# Patient Record
Sex: Male | Born: 1935 | Race: White | Hispanic: No | Marital: Married | Smoking: Former smoker
Health system: Southern US, Community
[De-identification: ages and names within clinical notes are randomized; demographics above are authoritative.]

## PROBLEM LIST (undated history)

## (undated) DIAGNOSIS — F419 Anxiety disorder, unspecified: Secondary | ICD-10-CM

## (undated) DIAGNOSIS — I209 Angina pectoris, unspecified: Secondary | ICD-10-CM

## (undated) DIAGNOSIS — L111 Transient acantholytic dermatosis [Grover]: Secondary | ICD-10-CM

## (undated) DIAGNOSIS — R319 Hematuria, unspecified: Secondary | ICD-10-CM

## (undated) DIAGNOSIS — R42 Dizziness and giddiness: Secondary | ICD-10-CM

## (undated) DIAGNOSIS — H9313 Tinnitus, bilateral: Secondary | ICD-10-CM

## (undated) DIAGNOSIS — R51 Headache: Secondary | ICD-10-CM

## (undated) DIAGNOSIS — D61818 Other pancytopenia: Secondary | ICD-10-CM

## (undated) DIAGNOSIS — M1990 Unspecified osteoarthritis, unspecified site: Secondary | ICD-10-CM

## (undated) DIAGNOSIS — C801 Malignant (primary) neoplasm, unspecified: Secondary | ICD-10-CM

## (undated) DIAGNOSIS — Z789 Other specified health status: Secondary | ICD-10-CM

## (undated) HISTORY — PX: CARDIAC CATHETERIZATION: SHX172

## (undated) HISTORY — DX: Hematuria, unspecified: R31.9

## (undated) HISTORY — DX: Tinnitus, bilateral: H93.13

## (undated) HISTORY — DX: Other specified health status: Z78.9

## (undated) HISTORY — PX: NOSE SURGERY: SHX723

## (undated) HISTORY — DX: Other pancytopenia: D61.818

---

## 1968-10-26 HISTORY — PX: VASECTOMY: SHX75

## 1972-10-26 HISTORY — PX: THYROIDECTOMY, PARTIAL: SHX18

## 1988-10-26 HISTORY — PX: KNEE SURGERY: SHX244

## 1998-08-06 ENCOUNTER — Ambulatory Visit (HOSPITAL_COMMUNITY): Admission: RE | Admit: 1998-08-06 | Discharge: 1998-08-06 | Payer: Self-pay | Admitting: Gastroenterology

## 1999-01-21 ENCOUNTER — Ambulatory Visit (HOSPITAL_BASED_OUTPATIENT_CLINIC_OR_DEPARTMENT_OTHER): Admission: RE | Admit: 1999-01-21 | Discharge: 1999-01-21 | Payer: Self-pay | Admitting: Otolaryngology

## 2000-10-26 HISTORY — PX: RADIOACTIVE SEED IMPLANT: SHX5150

## 2001-07-18 ENCOUNTER — Ambulatory Visit (HOSPITAL_COMMUNITY): Admission: RE | Admit: 2001-07-18 | Discharge: 2001-07-18 | Payer: Self-pay | Admitting: Internal Medicine

## 2001-08-16 ENCOUNTER — Other Ambulatory Visit: Admission: RE | Admit: 2001-08-16 | Discharge: 2001-08-16 | Payer: Self-pay | Admitting: Urology

## 2001-08-23 ENCOUNTER — Ambulatory Visit: Admission: RE | Admit: 2001-08-23 | Discharge: 2001-11-21 | Payer: Self-pay | Admitting: Radiation Oncology

## 2001-09-15 ENCOUNTER — Ambulatory Visit (HOSPITAL_COMMUNITY): Admission: RE | Admit: 2001-09-15 | Discharge: 2001-09-15 | Payer: Self-pay | Admitting: Gastroenterology

## 2001-10-07 ENCOUNTER — Encounter: Admission: RE | Admit: 2001-10-07 | Discharge: 2001-10-07 | Payer: Self-pay | Admitting: Radiation Oncology

## 2001-11-22 ENCOUNTER — Emergency Department (HOSPITAL_COMMUNITY): Admission: EM | Admit: 2001-11-22 | Discharge: 2001-11-22 | Payer: Self-pay | Admitting: Emergency Medicine

## 2001-11-22 ENCOUNTER — Ambulatory Visit (HOSPITAL_BASED_OUTPATIENT_CLINIC_OR_DEPARTMENT_OTHER): Admission: RE | Admit: 2001-11-22 | Discharge: 2001-11-22 | Payer: Self-pay | Admitting: Urology

## 2001-12-15 ENCOUNTER — Ambulatory Visit: Admission: RE | Admit: 2001-12-15 | Discharge: 2002-03-15 | Payer: Self-pay | Admitting: Radiation Oncology

## 2002-08-22 ENCOUNTER — Encounter (HOSPITAL_BASED_OUTPATIENT_CLINIC_OR_DEPARTMENT_OTHER): Payer: Self-pay | Admitting: Internal Medicine

## 2002-08-22 ENCOUNTER — Ambulatory Visit (HOSPITAL_COMMUNITY): Admission: RE | Admit: 2002-08-22 | Discharge: 2002-08-22 | Payer: Self-pay | Admitting: Internal Medicine

## 2003-12-03 ENCOUNTER — Ambulatory Visit: Admission: RE | Admit: 2003-12-03 | Discharge: 2003-12-03 | Payer: Self-pay | Admitting: Radiation Oncology

## 2006-02-26 ENCOUNTER — Inpatient Hospital Stay (HOSPITAL_BASED_OUTPATIENT_CLINIC_OR_DEPARTMENT_OTHER): Admission: RE | Admit: 2006-02-26 | Discharge: 2006-02-26 | Payer: Self-pay | Admitting: Cardiology

## 2006-10-26 HISTORY — PX: CARPAL TUNNEL RELEASE: SHX101

## 2007-02-21 ENCOUNTER — Encounter: Admission: RE | Admit: 2007-02-21 | Discharge: 2007-02-21 | Payer: Self-pay | Admitting: General Surgery

## 2007-02-23 ENCOUNTER — Ambulatory Visit (HOSPITAL_BASED_OUTPATIENT_CLINIC_OR_DEPARTMENT_OTHER): Admission: RE | Admit: 2007-02-23 | Discharge: 2007-02-23 | Payer: Self-pay | Admitting: General Surgery

## 2007-04-25 ENCOUNTER — Emergency Department (HOSPITAL_COMMUNITY): Admission: EM | Admit: 2007-04-25 | Discharge: 2007-04-25 | Payer: Self-pay | Admitting: Emergency Medicine

## 2007-07-10 ENCOUNTER — Emergency Department (HOSPITAL_COMMUNITY): Admission: EM | Admit: 2007-07-10 | Discharge: 2007-07-10 | Payer: Self-pay | Admitting: Emergency Medicine

## 2007-08-12 ENCOUNTER — Encounter: Admission: RE | Admit: 2007-08-12 | Discharge: 2007-08-12 | Payer: Self-pay | Admitting: Internal Medicine

## 2007-08-30 ENCOUNTER — Encounter: Admission: RE | Admit: 2007-08-30 | Discharge: 2007-08-30 | Payer: Self-pay | Admitting: Internal Medicine

## 2007-09-29 ENCOUNTER — Ambulatory Visit (HOSPITAL_BASED_OUTPATIENT_CLINIC_OR_DEPARTMENT_OTHER): Admission: RE | Admit: 2007-09-29 | Discharge: 2007-09-29 | Payer: Self-pay | Admitting: Orthopedic Surgery

## 2010-02-25 ENCOUNTER — Ambulatory Visit (HOSPITAL_BASED_OUTPATIENT_CLINIC_OR_DEPARTMENT_OTHER): Admission: RE | Admit: 2010-02-25 | Discharge: 2010-02-25 | Payer: Self-pay | Admitting: Urology

## 2010-10-26 HISTORY — PX: SHOULDER SURGERY: SHX246

## 2011-03-10 NOTE — Op Note (Signed)
[name], Jordan Terry                ACCOUNT NO.:  [id number]   MEDICAL RECORD NO.:  [id number]          PATIENT TYPE:  AMB   LOCATION:  DSC                          FACILITY:  [facility]   PHYSICIAN:  Cindee Salt, M.D.       DATE OF BIRTH:  11/15/1931   DATE OF PROCEDURE:  09/29/2007  DATE OF DISCHARGE:                               OPERATIVE REPORT   PREOPERATIVE DIAGNOSIS:  Carpal tunnel syndrome, left hand.   POSTOPERATIVE DIAGNOSIS:  Carpal tunnel syndrome, left hand.   OPERATION:  Decompression left median nerve.   SURGEON:  Cindee Salt, M.D.   ASSISTANT:  None.   ANESTHESIA:  Forearm based IV regional.   DATE OF OPERATION:  September 29, 2007.   HISTORY:  The patient is a 77 year old male with a history of carpal  tunnel syndrome, EMG nerve conductions positive.  This has not responded  to conservative treatment.  He has elected to undergo surgical  decompression.  Pre and postoperative course have been discussed along  with the risks and complications.  He is aware there is no guarantee  with surgery, possibility of infection, recurrence, injury to arteries,  nerves, tendons, incomplete relief of symptoms, dystrophy.  In the  preoperative area the patient is seen, questions encouraged and answered  again, the extremity marked by both the patient and surgeon.  Antibiotic  given.   PROCEDURE:  The patient is brought to the operating room where a forearm  based IV regional anesthetic was carried out without difficulty.  He was  prepped using DuraPrep, supine position, left arm free.  A longitudinal  incision was made in the palm, carried down through subcutaneous tissue.  Bleeders were electrocauterized.  Palmar fascia split, superficial  palmar arch identified, flexor tendon of the ring and little finger  identified to the ulnar side of median nerve and carpal retinaculum was  incised with sharp dissection.  A right-angle and Sewall retractor were  placed between skin and  forearm fascia.  The fascia was released for  approximately 1.5 cm proximal to the wrist crease under direct vision.  Canal was explored.  No further lesions were identified.  The wound was  irrigated.  Skin  was closed with interrupted 5-0 Vicryl Rapide sutures.  A sterile  compressive dressing and splint to the wrist applied.  The patient  tolerated the procedure well and was taken to the recovery room for  observation in satisfactory condition.  He will be discharged home to  return to the [facility] [place] in 1 week on [place].           ______________________________  Cindee Salt, M.D.     GK/MEDQ  D:  09/29/2007  T:  09/29/2007  Job:  [id number]   cc:   Cindee Salt, M.D.  Barry Dienes Eloise Harman, M.D.

## 2011-03-13 NOTE — Op Note (Signed)
Jordan Terry, Jordan Terry                ACCOUNT NO.:  [id number]   MEDICAL RECORD NO.:  [id number]          PATIENT TYPE:  AMB   LOCATION:  NESC                         FACILITY:  [facility]   PHYSICIAN:  Adolph Pollack, M.D.DATE OF BIRTH:  05-Aug-1932   DATE OF PROCEDURE:  DATE OF DISCHARGE:                               OPERATIVE REPORT   PREOPERATIVE DIAGNOSIS:  Right inguinal hernia.   POSTOPERATIVE DIAGNOSIS:  Indirect right inguinal hernia.   PROCEDURE:  Right inguinal hernia repair with mesh.   SURGEON:  Adolph Pollack, M.D.   ANESTHESIA:  General by way of LMA plus local (0.5% Marcaine).   INDICATIONS:  Jordan Terry is a 77 year old male, quite active, who noted a  bulge with discomfort in the right groin.  He has a right inguinal  hernia and now presents for repair.  We have discussed procedure risks  and aftercare preoperatively.   TECHNIQUE:  He is seen in the holding area and the right groin marked  with my initials.  He was then brought to the operating and placed  supine on the operating table and given a general anesthetic by way of  LMA.  The hair on the right groin was clipped and the area was sterilely  prepped and draped.  Marcaine solution was infiltrated superficially and  deep in the right groin.  A right groin incision was made through the  skin and subcutaneous tissue and Scarpa's fascia, exposing the external  oblique aponeurosis.  Local anesthetic was infiltrated deep to the  external oblique aponeurosis.  An incision was made in the external  oblique aponeurosis through the external ring medially and up toward the  anterior superior iliac spine laterally.  Using blunt dissection, I  identified the internal oblique muscle and aponeurosis superiorly and  the shelving edge of the inguinal ligament inferiorly.  Ilioinguinal  nerve was identified and retracted superiorly.   I then isolated the spermatic cord and bluntly created a window around  it.  An  indirect sac was noted and dissected free from the spermatic  cord and then reduced back through a patulous internal ring.  Following  this, a piece of 3 x 6 inch polypropylene mesh was brought into the  field.  It was anchored 1 cm medial to the pubic tubercle with a 2-0  Prolene suture.  The inferior aspect of the mesh was then anchored to  the shelving edge of the inguinal ligament with a running 2-0 Prolene  suture up to the level of 1-2 cm lateral to the internal ring.  A slit  was cut in the mesh and two tails wrapped around the cord.  The superior  aspect of the mesh was anchored to the internal oblique aponeurosis with  interrupted 2-0 Vicryl sutures.  Two tails of the mesh were crossed  creating a new internal ring and these were anchored to the shelving  edge of the inguinal ligament with a 2-0 Prolene suture.  The tip of the  hemostat was able to be placed in the new aperture.   Following this, I inspected the area  and hemostasis was adequate.  Local  anesthetic was infiltrated in around the inner ilioinguinal nerve.  The  external oblique aponeurosis was then closed over the mesh and cord with  a running 3-0 Vicryl suture.  Scarpa's fascia was reapproximated with a  running 2-0 Vicryl suture.  The skin was closed with a 4-0 Monocryl  subcuticular stitch followed by Steri-Strips and sterile dressing.   He tolerated the procedure without any apparent complications and he was  taken to the PACU in satisfactory condition.      Adolph Pollack, M.D.  Electronically Signed     TJR/MEDQ  D:  02/23/2007  T:  02/23/2007  Job:  [id number]

## 2011-03-13 NOTE — H&P (Signed)
Jordan Terry, Jordan Terry                ACCOUNT NO.:  [id number]   MEDICAL RECORD NO.:  [id number]            PATIENT TYPE:   LOCATION:                                 FACILITY:   PHYSICIAN:  Colleen Can. Deborah Chalk, M.D.DATE OF BIRTH:  08-20-1932   DATE OF ADMISSION:  02/26/2006  DATE OF DISCHARGE:                                HISTORY & PHYSICAL   CHIEF COMPLAINT:  Chest pain.   HISTORY OF PRESENT ILLNESS:  The patient is a 77 year old male who is  referred for diagnostic catheterization.  He has had chest pain with  exertion that has been worsening.  He maintains a regular exercise program.  After walking 2 to 3 miles, he will begin to have chest tightness.  He has  also had substernal chest pain with yard work.  He was seen by his primary  care Dishon Kehoe last week and reported these symptoms.  He does obtain relief  with Lorcet.  He is now referred for elective cardiac catheterization.   PAST MEDICAL HISTORY:  1.  Chronic mild low back pain.  2.  History of sleep disorder.  3.  Significant anxiety.  4.  Gastroesophageal reflux disease.  5.  Osteoarthritis.  6.  Partial thyroidectomy.  7.  History of rectal bleeding.  8.  Previous knee surgeries.  9.  Remote history of migraine.   ALLERGIES:  None.   CURRENT MEDICATIONS:  1.  Ativan 1 mg a day.  2.  Lorcet p.r.n.  3.  Neurontin 300 mg a day.  4.  Ibuprofen p.r.n.  5.  Restoril p.r.n.   FAMILY HISTORY:  Both of his parents are deceased.  Father died at 61 of  lung and colon cancer.  Mother died in 99s of natural causes.   SOCIAL HISTORY:  He is married.  He has no current alcohol or tobacco.   REVIEW OF SYSTEMS:  Basically as noted above.  He does have significant  anxiety and stressors in his life which have been chronic in nature.  He  does have difficulty sleeping which has been chronic as well.  He has had  problems with gastroesophageal reflux disease.  He has had no chest pain at  rest.   PHYSICAL EXAMINATION:   GENERAL:  He is an anxious white male in no acute  distress.  VITAL SIGNS: Weight 166 pounds, blood pressure 130/80 sitting, 122/80  standing, heart rate 67, respirations 18.  He is afebrile.  SKIN:  Warm and dry.  Color is unremarkable.  LUNGS: Clear.  HEART: Regular rhythm.  ABDOMEN: Soft, positive bowel sounds, nontender.  EXTREMITIES:  Without edema.  NEUROLOGIC: Intact.  There are no gross focal deficits.   LABORATORY DATA:  Pertinent labs are pending.   EKG shows sinus rhythm with no acute changes.   OVERALL IMPRESSION:  1.  Exertional chest pain.  2.  Significant anxiety and depression.   PLAN:  We will proceed on with diagnostic catheterization.  The procedure  has been reviewed in full detail, and he is willing to proceed on Friday,  Feb 26, 2006.  Sharlee Blew, N.P.      Colleen Can. Deborah Chalk, M.D.  Electronically Signed    LC/MEDQ  D:  02/15/2006  T:  02/15/2006  Job:  [id number]   cc:   Barry Dienes. Eloise Harman, M.D.  Fax: [phone]

## 2011-03-13 NOTE — Op Note (Signed)
NAMETHEORDORE, CISNERO                ACCOUNT NO.:  [id number]   MEDICAL RECORD NO.:  [id number]          PATIENT TYPE:  AMB   LOCATION:  NESC                         [facility]ACILITY:  [facility]   PHYSICIAN:  Adolph Pollack, M.D.DATE OF BIRTH:  1932-03-25   DATE OF PROCEDURE:  DATE OF DISCHARGE:                               OPERATIVE REPORT   Audio too short to transcribe (less than 5 seconds)      Adolph Pollack, M.D.     Kari Baars  D:  02/23/2007  T:  02/23/2007  Job:  [id number]

## 2011-03-13 NOTE — Cardiovascular Report (Signed)
Jordan Terry, Jordan Terry                ACCOUNT NO.:  [id number]   MEDICAL RECORD NO.:  [id number]          PATIENT TYPE:  OIB   LOCATION:  1961                         FACILITY:  [facility]   PHYSICIAN:  Colleen Can. Deborah Chalk, M.D.DATE OF BIRTH:  [birth year]   DATE OF PROCEDURE:  02/26/2006  DATE OF DISCHARGE:                              CARDIAC CATHETERIZATION   HISTORY:  Mr. Aldava has had there is persistent and recurrent substernal  chest tightness.  He has had longstanding chest discomfort that has been  equivocal as for exercise versus stress.  He is now referred for  catheterization for definition of the etiology of this chest pain syndrome.   PROCEDURE:  Left heart catheterization with selective coronary angiography  and left ventricular angiography.   TYPE AND SITE OF ENTRY:  Percutaneous right femoral artery.   CATHETERS:  4-French 4 curved Judkins right and left coronary catheters, 4-  French pigtail ventriculographic catheter.   CONTRAST MATERIAL:  Omnipaque.   MEDICATIONS GIVEN PRIOR TO PROCEDURE:  Valium 10 mg.   MEDICATIONS GIVEN DURING PROCEDURE:  Versed 3 mg IV.   COMMENT:  The patient tolerated the procedure well.   HEMODYNAMIC DATA:  The aortic pressure was 99/64, LV is 105/1-2.  There was  no aortic valve gradient on pullback.   ANGIOGRAPHIC DATA.:  1.  Left main coronary artery is normal.  2.  Left circumflex:  The left circumflex bifurcates high and __________.      It has a large obtuse marginal and continuation branch.  It is      essentially normal with only minor irregularities.  3.  Left anterior descending:  Left anterior descending is a long vessel      that wraps around the apex.  There are minor irregularities in the left      anterior descending, but there is some calcification.  There are two      high diagonal vessels, one being larger than the other.  There is 30%      ostial narrowing in these vessels but it is not felt to be critical.      These  vessels are moderate in size.  4.  Right coronary artery:  The right coronary artery is a large, dominant      vessel.  There is mild aneurysmal dilatation in the proximal part and      mild less than 30% irregularities in narrowing in the proximal portions      of the vessel.  The distal right coronary artery is essentially normal.      There does not appear to be any focal obstructive disease.   Left ventricular angiogram was performed in the RAO position.  The overall  cardiac size and silhouette are normal.  Global ejection fraction is 65%.  Regional wall motion is normal.   OVERALL IMPRESSION:  1.  Normal left ventricular function.  2.  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   DISCUSSION:  It is felt that Mr. Monje has mild coronary atherosclerosis but  is it would not be the clinically etiology of his chest pain syndrome.  We  will encourage stress management and overall emphasize what I would consider  to be a good prognosis.  He does need to have appropriate management of  cardiovascular risk as well.      Colleen Can. Deborah Chalk, M.D.  Electronically Signed     SNT/MEDQ  D:  02/26/2006  T:  02/26/2006  Job:  [id number]   cc:   Barry Dienes. Eloise Harman, M.D.  Fax: [phone]

## 2011-05-21 ENCOUNTER — Encounter (INDEPENDENT_AMBULATORY_CARE_PROVIDER_SITE_OTHER): Payer: MEDICARE

## 2011-05-21 DIAGNOSIS — M79609 Pain in unspecified limb: Secondary | ICD-10-CM

## 2011-05-27 ENCOUNTER — Other Ambulatory Visit: Payer: Self-pay | Admitting: Dermatology

## 2011-06-03 NOTE — Procedures (Unsigned)
DUPLEX DEEP VENOUS EXAM - LOWER EXTREMITY  INDICATION:  Pain, rule out DVT.  HISTORY:  Edema:  No. Trauma/Surgery:  No. Pain:  The patient states he had 2 ten minute episodes of left foot/ankle pain over the last 3 days, currently having left anterior knee pain. PE:  No. Previous DVT:  No. Anticoagulants: Other:  DUPLEX EXAM:               CFV   SFV   PopV  PTV    GSV               R  L  R  L  R  L  R   L  R  L Thrombosis    o  o     o     o      o     o Spontaneous   +  +     +     +      +     + Phasic        +  +     +     +      +     + Augmentation  +  +     +     +      +     + Compressible  +  +     +     +      +     + Competent  Legend:  + - yes  o - no  p - partial  D - decreased  IMPRESSION:  No evidence of deep or superficial vein thrombosis noted in the left lower extremity. Preliminary findings stating the patency and compressibility of the left lower extremity venous system were given verbally to Fannie Knee Drinkard on 05/21/2011 at 2:15 p.m.   _____________________________ V. Charlena Cross, MD  CH/MEDQ  D:  05/22/2011  T:  05/22/2011  Job:  [id number]

## 2011-06-09 ENCOUNTER — Emergency Department (HOSPITAL_COMMUNITY)
Admission: EM | Admit: 2011-06-09 | Discharge: 2011-06-09 | Disposition: A | Discharge status: Home / Self Care | Payer: MEDICARE | Attending: Emergency Medicine | Admitting: Emergency Medicine

## 2011-06-09 ENCOUNTER — Emergency Department (HOSPITAL_COMMUNITY): Payer: MEDICARE

## 2011-06-09 ENCOUNTER — Encounter (HOSPITAL_COMMUNITY): Payer: Self-pay

## 2011-06-09 DIAGNOSIS — R1031 Right lower quadrant pain: Secondary | ICD-10-CM | POA: Insufficient documentation

## 2011-06-09 DIAGNOSIS — I2510 Atherosclerotic heart disease of native coronary artery without angina pectoris: Secondary | ICD-10-CM | POA: Insufficient documentation

## 2011-06-09 LAB — URINALYSIS, ROUTINE W REFLEX MICROSCOPIC
Ketones, ur: NEGATIVE mg/dL
Leukocytes, UA: NEGATIVE
Nitrite: NEGATIVE
Protein, ur: NEGATIVE mg/dL
Urobilinogen, UA: 0.2 mg/dL (ref 0.0–1.0)
pH: 5 (ref 5.0–8.0)

## 2011-06-09 LAB — DIFFERENTIAL
Eosinophils Relative: 1 % (ref 0–5)
Lymphocytes Relative: 20 % (ref 12–46)
Lymphs Abs: 1.4 10*3/uL (ref 0.7–4.0)
Monocytes Absolute: 0.7 10*3/uL (ref 0.1–1.0)

## 2011-06-09 LAB — COMPREHENSIVE METABOLIC PANEL
Albumin: 3.4 g/dL — ABNORMAL LOW (ref 3.5–5.2)
BUN: 13 mg/dL (ref 6–23)
Chloride: 104 mEq/L (ref 96–112)
Creatinine, Ser: 0.75 mg/dL (ref 0.50–1.35)
GFR calc Af Amer: >60 mL/min (ref >60)
Glucose, Bld: 104 mg/dL — ABNORMAL HIGH (ref 70–99)
Total Bilirubin: 0.7 mg/dL (ref 0.3–1.2)

## 2011-06-09 LAB — CBC
HCT: 36.9 % — ABNORMAL LOW (ref 39.0–52.0)
MCHC: 32.5 g/dL (ref 30.0–36.0)
MCV: 78 fL (ref 78.0–100.0)
RDW: 19.1 % — ABNORMAL HIGH (ref 11.5–15.5)
WBC: 6.7 10*3/uL (ref 4.0–10.5)

## 2011-06-09 LAB — LIPASE, BLOOD: Lipase: 36 U/L (ref 11–59)

## 2011-06-09 MED ORDER — IOHEXOL 300 MG/ML  SOLN
100.0000 mL | Freq: Once | INTRAMUSCULAR | Status: AC | PRN
  Administered 2011-06-09: 100 mL via INTRAVENOUS

## 2011-06-10 LAB — URINE CULTURE: Culture  Setup Time: 201208140911

## 2011-06-11 ENCOUNTER — Other Ambulatory Visit: Payer: Self-pay | Admitting: Gastroenterology

## 2011-06-19 ENCOUNTER — Other Ambulatory Visit: Payer: Self-pay | Admitting: Gastroenterology

## 2011-07-05 ENCOUNTER — Emergency Department (HOSPITAL_COMMUNITY)
Admission: EM | Admit: 2011-07-05 | Discharge: 2011-07-05 | Disposition: A | Discharge status: Home / Self Care | Payer: MEDICARE | Attending: Emergency Medicine | Admitting: Emergency Medicine

## 2011-07-05 DIAGNOSIS — I2510 Atherosclerotic heart disease of native coronary artery without angina pectoris: Secondary | ICD-10-CM | POA: Insufficient documentation

## 2011-07-05 DIAGNOSIS — R259 Unspecified abnormal involuntary movements: Secondary | ICD-10-CM | POA: Insufficient documentation

## 2011-07-05 DIAGNOSIS — I440 Atrioventricular block, first degree: Secondary | ICD-10-CM | POA: Insufficient documentation

## 2011-07-05 DIAGNOSIS — R109 Unspecified abdominal pain: Secondary | ICD-10-CM | POA: Insufficient documentation

## 2011-07-05 DIAGNOSIS — R112 Nausea with vomiting, unspecified: Secondary | ICD-10-CM | POA: Insufficient documentation

## 2011-07-05 DIAGNOSIS — F411 Generalized anxiety disorder: Secondary | ICD-10-CM | POA: Insufficient documentation

## 2011-07-05 DIAGNOSIS — Z79899 Other long term (current) drug therapy: Secondary | ICD-10-CM | POA: Insufficient documentation

## 2011-07-05 LAB — COMPREHENSIVE METABOLIC PANEL
ALT: 7 U/L (ref 0–53)
AST: 10 U/L (ref 0–37)
Albumin: 3.7 g/dL (ref 3.5–5.2)
Alkaline Phosphatase: 54 U/L (ref 39–117)
BUN: 9 mg/dL (ref 6–23)
CO2: 28 mEq/L (ref 19–32)
Calcium: 9.2 mg/dL (ref 8.4–10.5)
Chloride: 104 mEq/L (ref 96–112)
Creatinine, Ser: 0.83 mg/dL (ref 0.50–1.35)
GFR calc Af Amer: >60 mL/min (ref >60)
GFR calc non Af Amer: >60 mL/min (ref >60)
Glucose, Bld: 108 mg/dL — ABNORMAL HIGH (ref 70–99)
Potassium: 3.6 mEq/L (ref 3.5–5.1)
Sodium: 141 mEq/L (ref 135–145)
Total Bilirubin: 0.5 mg/dL (ref 0.3–1.2)
Total Protein: 6.6 g/dL (ref 6.0–8.3)

## 2011-07-05 LAB — CBC
HCT: 38.8 % — ABNORMAL LOW (ref 39.0–52.0)
Hemoglobin: 12.9 g/dL — ABNORMAL LOW (ref 13.0–17.0)
MCH: 25.7 pg — ABNORMAL LOW (ref 26.0–34.0)
MCHC: 33.2 g/dL (ref 30.0–36.0)
MCV: 77.3 fL — ABNORMAL LOW (ref 78.0–100.0)
Platelets: 170 10*3/uL (ref 150–400)
RBC: 5.02 MIL/uL (ref 4.22–5.81)
RDW: 19.1 % — ABNORMAL HIGH (ref 11.5–15.5)
WBC: 3.5 10*3/uL — ABNORMAL LOW (ref 4.0–10.5)

## 2011-07-05 LAB — DIFFERENTIAL
Basophils Absolute: 0 10*3/uL (ref 0.0–0.1)
Basophils Relative: 1 % (ref 0–1)
Eosinophils Absolute: 0 10*3/uL (ref 0.0–0.7)
Eosinophils Relative: 1 % (ref 0–5)
Lymphocytes Relative: 32 % (ref 12–46)
Lymphs Abs: 1.1 10*3/uL (ref 0.7–4.0)
Monocytes Absolute: 0.3 10*3/uL (ref 0.1–1.0)
Monocytes Relative: 8 % (ref 3–12)
Neutro Abs: 2.1 10*3/uL (ref 1.7–7.7)
Neutrophils Relative %: 58 % (ref 43–77)

## 2011-07-05 LAB — URINALYSIS, ROUTINE W REFLEX MICROSCOPIC
Bilirubin Urine: NEGATIVE
Glucose, UA: NEGATIVE mg/dL
Hgb urine dipstick: NEGATIVE
Ketones, ur: NEGATIVE mg/dL
Leukocytes, UA: NEGATIVE
Nitrite: NEGATIVE
Protein, ur: NEGATIVE mg/dL
Specific Gravity, Urine: 1.015 (ref 1.005–1.030)
Urobilinogen, UA: 0.2 mg/dL (ref 0.0–1.0)
pH: 6 (ref 5.0–8.0)

## 2011-07-05 LAB — LIPASE, BLOOD: Lipase: 48 U/L (ref 11–59)

## 2011-07-05 LAB — POCT I-STAT TROPONIN I

## 2011-07-22 ENCOUNTER — Other Ambulatory Visit (HOSPITAL_COMMUNITY): Payer: Self-pay | Admitting: Gastroenterology

## 2011-07-22 DIAGNOSIS — R110 Nausea: Secondary | ICD-10-CM

## 2011-08-03 LAB — POCT HEMOGLOBIN-HEMACUE: Operator id: 123881

## 2011-08-06 ENCOUNTER — Encounter (HOSPITAL_COMMUNITY)
Admission: RE | Admit: 2011-08-06 | Discharge: 2011-08-06 | Disposition: A | Discharge status: Home / Self Care | Payer: Medicare Other | Source: Ambulatory Visit | Attending: Gastroenterology | Admitting: Gastroenterology

## 2011-08-06 DIAGNOSIS — R110 Nausea: Secondary | ICD-10-CM | POA: Insufficient documentation

## 2011-08-06 MED ORDER — TECHNETIUM TC 99M SULFUR COLLOID
2.0000 | Freq: Once | INTRAVENOUS | Status: AC | PRN
  Administered 2011-08-06: 2 via INTRAVENOUS

## 2011-08-07 LAB — URINALYSIS, ROUTINE W REFLEX MICROSCOPIC
Nitrite: NEGATIVE
Protein, ur: 30 — AB
Specific Gravity, Urine: 1.01
Urobilinogen, UA: 0.2

## 2011-08-07 LAB — URINE MICROSCOPIC-ADD ON

## 2012-03-03 ENCOUNTER — Encounter (HOSPITAL_COMMUNITY): Payer: Self-pay | Admitting: *Deleted

## 2012-03-03 ENCOUNTER — Emergency Department (HOSPITAL_COMMUNITY)
Admission: EM | Admit: 2012-03-03 | Discharge: 2012-03-03 | Disposition: A | Discharge status: Home / Self Care | Payer: Medicare Other | Attending: Emergency Medicine | Admitting: Emergency Medicine

## 2012-03-03 DIAGNOSIS — R143 Flatulence: Secondary | ICD-10-CM | POA: Insufficient documentation

## 2012-03-03 DIAGNOSIS — R10819 Abdominal tenderness, unspecified site: Secondary | ICD-10-CM | POA: Insufficient documentation

## 2012-03-03 DIAGNOSIS — R109 Unspecified abdominal pain: Secondary | ICD-10-CM | POA: Insufficient documentation

## 2012-03-03 DIAGNOSIS — R141 Gas pain: Secondary | ICD-10-CM | POA: Insufficient documentation

## 2012-03-03 DIAGNOSIS — R142 Eructation: Secondary | ICD-10-CM | POA: Insufficient documentation

## 2012-03-03 DIAGNOSIS — R350 Frequency of micturition: Secondary | ICD-10-CM | POA: Insufficient documentation

## 2012-03-03 DIAGNOSIS — Z8546 Personal history of malignant neoplasm of prostate: Secondary | ICD-10-CM | POA: Insufficient documentation

## 2012-03-03 DIAGNOSIS — R339 Retention of urine, unspecified: Secondary | ICD-10-CM

## 2012-03-03 HISTORY — DX: Anxiety disorder, unspecified: F41.9

## 2012-03-03 HISTORY — DX: Malignant (primary) neoplasm, unspecified: C80.1

## 2012-03-03 HISTORY — DX: Transient acantholytic dermatosis (grover): L11.1

## 2012-03-03 HISTORY — DX: Dizziness and giddiness: R42

## 2012-03-03 MED ORDER — CIPROFLOXACIN HCL 500 MG PO TABS: 500.0000 mg | ORAL_TABLET | Freq: Two times a day (BID) | ORAL | Status: AC

## 2012-03-03 MED ORDER — KETOROLAC TROMETHAMINE 60 MG/2ML IM SOLN
60.0000 mg | Freq: Once | INTRAMUSCULAR | Status: AC
  Administered 2012-03-03: 60 mg via INTRAMUSCULAR
  Filled 2012-03-03: qty 2

## 2012-03-03 NOTE — Discharge Instructions (Signed)
Take Cipro to prevent infection.  Followup with Dr. Laverle Patter, for reevaluation.  Return for worse or uncontrolled symptoms

## 2012-03-03 NOTE — ED Notes (Signed)
Attempted to cath pt without success, coude cath requested to [facility] by Dr. Weldon Inches, attempts by Dr. Weldon Inches also unsuccessful.

## 2012-03-03 NOTE — ED Notes (Signed)
Pt states "I've been hurting some when I urinate for about a month now, I called my urologist and they wanted me to see a PA but I didn't want to"

## 2012-03-03 NOTE — ED Provider Notes (Addendum)
History     CSN: [id number]  Arrival date & time 03/03/12  1458   First MD Initiated Contact with Patient 03/03/12 1520      Chief Complaint  Patient presents with  . Urinary Retention    (Consider location/radiation/quality/duration/timing/severity/associated sxs/prior treatment) The history is provided by the patient.   the patient is a 78 year old, male, with a history of prostate cancer, who presents to emergency department complaining of inability to void since about 11:00 this morning.  He also complains of lower abdominal pain.  He denies nausea, vomiting, fevers, chills.  He has had urinary retention in the past.  His urologist is Dr. Laverle Patter.  Past Medical History  Diagnosis Date  . Grover's disease   . Depression   . Anxiety   . Vertigo   . Cancer     prostate    Past Surgical History  Procedure Date  . Shoulder surgery     right  . Knee surgery     No family history on file.  History  Substance Use Topics  . Smoking status: Never Smoker   . Smokeless tobacco: Not on file  . Alcohol Use: No      Review of Systems  Constitutional: Negative for fever and chills.  Gastrointestinal: Positive for abdominal pain and abdominal distention. Negative for nausea and vomiting.  Genitourinary: Positive for decreased urine volume.  All other systems reviewed and are negative.    Allergies  Review of patient's allergies indicates not on file.  Home Medications  No current outpatient prescriptions on file.  BP 158/95  Pulse 86  Temp(Src) 98.4 F (36.9 C) (Oral)  Resp 16  Wt 156 lb (70.761 kg)  SpO2 99%  Physical Exam  Vitals reviewed. Constitutional: He is oriented to person, place, and time. He appears well-developed and well-nourished. No distress.  HENT:  Head: Normocephalic and atraumatic.  Eyes: Conjunctivae are normal.  Neck: Normal range of motion.  Abdominal: Soft. He exhibits distension. There is tenderness. There is no rebound and no  guarding.       Suprapubic tenderness, with distention.  No peritoneal signs  Musculoskeletal: Normal range of motion.  Neurological: He is alert and oriented to person, place, and time.  Skin: Skin is warm and dry.  Psychiatric: He has a normal mood and affect. Thought content normal.    ED Course  Procedures (including critical care time) Urinary retention The nurse, and I were both unable to pass a Foley catheter.  I also tried a coud catheter unsuccessfully.  We will page the urologist, for placement of a catheter  Labs Reviewed - No data to display No results found.   No diagnosis found.  4:18 PM i spoke with dr. Laverle Patter.  He will come tx the pt.   Dr. Laverle Patter came and placed a foley. He asked me to give pt cipro to go home. He will arrange f/u in office  MDM  Urinary retention        Cheri Guppy, MD 03/03/12 4098  Cheri Guppy, MD 03/03/12 1191

## 2012-03-03 NOTE — ED Notes (Signed)
States "feels like a knife in abd"

## 2012-03-03 NOTE — ED Notes (Signed)
States last time urinated was 11 am.

## 2012-03-03 NOTE — Consult Note (Signed)
  Urology Consult   Physician requesting consult: Dr. Nino Parsley  Reason for consult: Urinary retention and difficult foley catheterization  History of Present Illness: Jordan Terry is a 78 y.o. with a history of prostate cancer s/p radiation therapy and recurrent urethral bulbar stricture disease.  He has required dilation in the past although has been doing well for the past couple of years.  He presented to the emergency room today with severe suprapubic pain and an inability to void.  Multiple attempts at catheterization have been unsuccessful by the ED staff.    Past Medical History  Diagnosis Date  . Grover's disease   . Depression   . Anxiety   . Vertigo   . Cancer     prostate    Past Surgical History  Procedure Date  . Shoulder surgery     right  . Knee surgery     Current Hospital Medications:    Scheduled Meds:   . ketorolac  60 mg Intramuscular Once   Continuous Infusions:  PRN Meds:.  Allergies:  Allergies  Allergen Reactions  . Hydrocodone Itching    No family history on file.  Social History:  reports that he has never smoked. He does not have any smokeless tobacco history on file. He reports that he does not drink alcohol or use illicit drugs.  ROS: A complete review of systems was performed.  All systems are negative except for pertinent findings as noted.  Physical Exam:  Vital signs in last 24 hours: Temp:  [98.4 F (36.9 C)] 98.4 F (36.9 C) (05/09 1503) Pulse Rate:  [86] 86  (05/09 1503) Resp:  [16] 16  (05/09 1503) BP: (158)/(95) 158/95 mmHg (05/09 1503) SpO2:  [99 %] 99 % (05/09 1503) Weight:  [70.761 kg (156 lb)] 70.761 kg (156 lb) (05/09 1503) General:  Alert and oriented, No acute distress HEENT: Normocephalic, atraumatic Neck: No JVD or lymphadenopathy Cardiovascular: Regular rate and rhythm Abdomen: SP distention Back: No CVA tenderness Extremities: No edema Neurologic: Grossly intact  Procedure:  The patient was  prepped and draped in the usual sterile fashion. I attempted to place a 12 Fr catheter under sterile conditions which was unsuccessful.  I then performed flexible cystoscopy and this revealed a tight pinpoint bulbar urethral stricture similar to his prior stricture.  A 0.38 Sensor guide wire was placed through the stricture and the Ultraxx dilating balloon was placed over the wire and inflated to 24 Fr.  It was left inflated for about 3 minutes and then deflated and removed.  A 16 Fr council catheter was then placed over the wire and into the bladder with about 500 cc of grossly clear urine returned and relief of the patient's pain.  He was given a prescription for Cipro 500 mg po x 2 doses for prophylaxis.  Impression/Assessment:  Urinary retention secondary to urethral stricture  Plan:  He will be discharged with his catheter and will followup for a voiding trial.  Baraka Klatt,LES 03/03/2012, 6:07 PM    Moody Bruins. MD

## 2012-06-28 ENCOUNTER — Other Ambulatory Visit: Payer: Self-pay | Admitting: Dermatology

## 2012-10-26 HISTORY — PX: CATARACT EXTRACTION: SUR2

## 2013-01-25 ENCOUNTER — Telehealth: Payer: Self-pay

## 2013-01-25 MED ORDER — NORTRIPTYLINE HCL 10 MG PO CAPS: 30.0000 mg | ORAL_CAPSULE | Freq: Every day | ORAL | Status: DC

## 2013-01-25 NOTE — Telephone Encounter (Signed)
Patient called clinic requesting refill on Nortriptyline at new dose of 3 hs.  He wanted that sent to [facility] along with [facility].  Rx's have been sent.

## 2013-04-16 ENCOUNTER — Emergency Department (HOSPITAL_COMMUNITY)
Admission: EM | Admit: 2013-04-16 | Discharge: 2013-04-16 | Disposition: A | Discharge status: Home / Self Care | Payer: Medicare Other | Attending: Emergency Medicine | Admitting: Emergency Medicine

## 2013-04-16 ENCOUNTER — Encounter (HOSPITAL_COMMUNITY): Payer: Self-pay

## 2013-04-16 DIAGNOSIS — N35919 Unspecified urethral stricture, male, unspecified site: Secondary | ICD-10-CM | POA: Insufficient documentation

## 2013-04-16 DIAGNOSIS — Z923 Personal history of irradiation: Secondary | ICD-10-CM | POA: Insufficient documentation

## 2013-04-16 DIAGNOSIS — R339 Retention of urine, unspecified: Secondary | ICD-10-CM | POA: Insufficient documentation

## 2013-04-16 DIAGNOSIS — R109 Unspecified abdominal pain: Secondary | ICD-10-CM | POA: Insufficient documentation

## 2013-04-16 DIAGNOSIS — R300 Dysuria: Secondary | ICD-10-CM | POA: Insufficient documentation

## 2013-04-16 DIAGNOSIS — F411 Generalized anxiety disorder: Secondary | ICD-10-CM | POA: Insufficient documentation

## 2013-04-16 DIAGNOSIS — L988 Other specified disorders of the skin and subcutaneous tissue: Secondary | ICD-10-CM | POA: Insufficient documentation

## 2013-04-16 DIAGNOSIS — F329 Major depressive disorder, single episode, unspecified: Secondary | ICD-10-CM | POA: Insufficient documentation

## 2013-04-16 DIAGNOSIS — Z79899 Other long term (current) drug therapy: Secondary | ICD-10-CM | POA: Insufficient documentation

## 2013-04-16 DIAGNOSIS — R3915 Urgency of urination: Secondary | ICD-10-CM | POA: Insufficient documentation

## 2013-04-16 DIAGNOSIS — Z8546 Personal history of malignant neoplasm of prostate: Secondary | ICD-10-CM | POA: Insufficient documentation

## 2013-04-16 DIAGNOSIS — F3289 Other specified depressive episodes: Secondary | ICD-10-CM | POA: Insufficient documentation

## 2013-04-16 LAB — URINALYSIS, ROUTINE W REFLEX MICROSCOPIC
Bilirubin Urine: NEGATIVE
Glucose, UA: NEGATIVE mg/dL
Specific Gravity, Urine: 1.024 (ref 1.005–1.030)

## 2013-04-16 LAB — CBC
HCT: 39.3 % (ref 39.0–52.0)
Hemoglobin: 13 g/dL (ref 13.0–17.0)
MCH: 26.2 pg (ref 26.0–34.0)
MCHC: 33.1 g/dL (ref 30.0–36.0)
RBC: 4.96 MIL/uL (ref 4.22–5.81)

## 2013-04-16 LAB — BASIC METABOLIC PANEL
BUN: 20 mg/dL (ref 6–23)
CO2: 28 mEq/L (ref 19–32)
Calcium: 9.3 mg/dL (ref 8.4–10.5)
Glucose, Bld: 109 mg/dL — ABNORMAL HIGH (ref 70–99)
Potassium: 4.2 mEq/L (ref 3.5–5.1)
Sodium: 139 mEq/L (ref 135–145)

## 2013-04-16 MED ORDER — CIPROFLOXACIN HCL 500 MG PO TABS
500.0000 mg | ORAL_TABLET | Freq: Once | ORAL | Status: AC
  Administered 2013-04-16: 500 mg via ORAL
  Filled 2013-04-16: qty 1

## 2013-04-16 MED ORDER — KETOROLAC TROMETHAMINE 15 MG/ML IJ SOLN
15.0000 mg | Freq: Once | INTRAMUSCULAR | Status: DC
  Filled 2013-04-16: qty 1

## 2013-04-16 MED ORDER — MORPHINE SULFATE 2 MG/ML IJ SOLN
1.0000 mg | Freq: Once | INTRAMUSCULAR | Status: AC
  Administered 2013-04-16: 1 mg via INTRAVENOUS
  Filled 2013-04-16: qty 1

## 2013-04-16 MED ORDER — CIPROFLOXACIN HCL 500 MG PO TABS: 500.0000 mg | ORAL_TABLET | Freq: Two times a day (BID) | ORAL | Status: DC

## 2013-04-16 MED ORDER — LORAZEPAM 2 MG/ML IJ SOLN
0.5000 mg | Freq: Once | INTRAMUSCULAR | Status: AC
  Administered 2013-04-16: 0.5 mg via INTRAVENOUS
  Filled 2013-04-16: qty 1

## 2013-04-16 NOTE — Consult Note (Signed)
Urology Consult  Requesting provider:  Dr. Thurnell Lose  CC: Urethral stricture. Urinary retention.  HPI: 79 year old male presents with urinary retention. This is been present for approximately 12 hours. It is associated with urgency. It is not associated with gross hematuria. He denies fever. This has occurred previously. Nothing makes it better or worse.  He has a history of urethral stricture. This has required dilation at least 4 times in the past, most recently being in April 2014. He has a history of radiation to his prostate for prostate cancer. He is followed by Dr. Laverle Patter.  Today explained to the patient that I would recommend starting with a cystoscopy as he has a history of stricture that was recently dilated. I'm afraid we finally tried to place a catheter we might obliterate any opening that may be visible. We discussed cystoscopy, Foley catheter placement, and urethral dilation. I obtained informed consent and we reviewed the risks, benefits, and side effects.  After informed consent was obtained, the patient's genitals were prepped and draped in usual fashion. A flexible cystoscope was advanced through the urethra and into the bulbar urethra where a stricture was encountered. This was a severe stricture. I could not clearly see the true lumen. I had the patient down and was able to see a small amount of urine but I could not find the lumen. I used an angle-tipped Glidewire, a Transport planner, and a sensor wire all without success. I then obtained a Kumpe catheter and passed this alongside the cystoscope. I felt that the true lumen was likely anterior and I directed a sensor tip wire through the Kumpe catheter to the anterior portion of the urethral stricture. I was able to pass this wire beyond the stricture. I then advanced a Kumpe catheter over the wire and removed the wire. There was immediate return of clear yellow fluid consistent with urine. I then replaced the sensor-tip wire removed the Kumpe  catheter and passed a 12 French dilator over the wire. After this I replaced the cystoscope next a the wire and was able to see into the bladder but not pass the scope into the bladder. This confirmed correct placement of the wire. I then placed a balloon dilator through the stricture based on measurements I obtained from how far the stricture was based on the cystoscope length. I then slowly inflated the balloon over the wire to 12 atmospheres. I then deflated this and passed the scope back along the wire and was able to see clearly into the bladder. I removed the scope and placed a 16 French council-tip catheter over the wire with ease. The wire was removed and 10 cc of sterile water were placed into the balloon. This was connected to closed drainage system. He tolerated the procedure well.  PMH: Past Medical History  Diagnosis Date  . Grover's disease   . Depression   . Anxiety   . Vertigo   . Cancer     prostate    PSH: Past Surgical History  Procedure Laterality Date  . Shoulder surgery      right  . Knee surgery      Allergies: Allergies  Allergen Reactions  . Benadryl (Diphenhydramine Hcl)     MAKES HIM GO CRAZY  . Hydrocodone Itching    Medications:  (Not in a hospital admission)   Social History: History   Social History  . Marital Status: Married    Spouse Name: N/A    Number of Children: N/A  .  Years of Education: N/A   Occupational History  . Not on file.   Social History Main Topics  . Smoking status: Never Smoker   . Smokeless tobacco: Not on file  . Alcohol Use: No  . Drug Use: No  . Sexually Active:    Other Topics Concern  . Not on file   Social History Narrative  . No narrative on file    Family History: History reviewed. No pertinent family history.  Review of Systems: Positive: Urgency. Negative: Fever, SOB, or chest pain.  A further 10 point review of systems was negative except what is listed in the HPI.  Physical Exam: Filed  Vitals:   04/16/13 0946  BP: 163/93  Pulse: 102  Temp: 98.3 F (36.8 C)  Resp: 18    General: No acute distress.  Awake. Head:  Normocephalic.  Atraumatic. ENT:  EOMI.  Mucous membranes moist Neck:  Supple.  No lymphadenopathy. CV:  S1 present. S2 present. Regular rate. Pulmonary: Equal effort bilaterally.  Clear to auscultation bilaterally. Abdomen: Soft.  Non- tender to palpation.  Skin:  Normal turgor.  No visible rash. Extremity: No gross deformity of bilateral upper extremities.  No gross deformity of    bilateral lower extremities. Neurologic: Alert. Appropriate mood.  Penis:  Circumcised.  No lesions.   Studies:  Recent Labs     04/16/13  1005  HGB  13.0  WBC  3.9*  PLT  149*    Recent Labs     04/16/13  1005  NA  139  K  4.2  CL  102  CO2  28  BUN  20  CREATININE  0.96  CALCIUM  9.3  GFRNONAA  77*  GFRAA  90*     No results found for this basename: PT, INR, APTT,  in the last 72 hours   No components found with this basename: ABG,     Assessment:  Urethral stricture. Urinary retention.  Plan: -Cystoscopy/urethral dilation w/ catheter placement at bedside. -I have asked Dr. Thurnell Lose to discharge the patient home with cipro 500 mg BID x 5 days. -Patient instructed to call tomorrow for f/u with Dr. Laverle Patter.    Pager: [phone]    CC: Dr. Thurnell Lose

## 2013-04-16 NOTE — ED Notes (Signed)
He c/o difficulty starting stream; at times unable to void x 1 week--worse since 0500 today.  He states he is uncomfortable in his bladder region.  Dr. Margarita Grizzle phoned before his arrival, and per his instructions, the Urology cart is near rm. 19.

## 2013-04-16 NOTE — ED Provider Notes (Signed)
History     CSN: [id number]  Arrival date & time 04/16/13  7829   First MD Initiated Contact with Patient 04/16/13 [phone]      Chief Complaint  Patient presents with  . Urinary Retention    (Consider location/radiation/quality/duration/timing/severity/associated sxs/prior treatment) The history is provided by the patient.  LEVELL TAVANO is a 79 y.o. male hx of Grover's disease, depression, prostate cancer s/p radiation with urethral strictures here with urinary retention. For the last week he has intermittent difficulty urinating. However it today around 5 AM he is unable to void. He has a history of urinary stricture and had a Foley that was taken out 6 weeks ago. He called his urologist for coming to the ER. Denies any flank pain or fevers or nausea or vomiting just bladder spasms.    Past Medical History  Diagnosis Date  . Grover's disease   . Depression   . Anxiety   . Vertigo   . Cancer     prostate    Past Surgical History  Procedure Laterality Date  . Shoulder surgery      right  . Knee surgery      History reviewed. No pertinent family history.  History  Substance Use Topics  . Smoking status: Never Smoker   . Smokeless tobacco: Not on file  . Alcohol Use: No      Review of Systems  Gastrointestinal: Positive for abdominal pain.  Genitourinary: Positive for difficulty urinating.  All other systems reviewed and are negative.    Allergies  Benadryl and Hydrocodone  Home Medications   Current Outpatient Rx  Name  Route  Sig  Dispense  Refill  . gabapentin (NEURONTIN) 400 MG capsule   Oral   Take 400 mg by mouth 2 (two) times daily.         Marland Kitchen LORazepam (ATIVAN) 1 MG tablet   Oral   Take 1 mg by mouth at bedtime.         . methotrexate 2.5 MG tablet   Oral   Take 5 mg by mouth once a week. Takes on fridays.         . nortriptyline (PAMELOR) 10 MG capsule   Oral   Take 3 capsules (30 mg total) by mouth at bedtime.   270 capsule   3   . temazepam (RESTORIL) 15 MG capsule   Oral   Take 15 mg by mouth at bedtime.         . traZODone (DESYREL) 50 MG tablet   Oral   Take 50 mg by mouth at bedtime.         . triamcinolone cream (KENALOG) 0.1 %   Topical   Apply 1 application topically every morning. APPLY TO CHEST AND BACK EVERY MORNING FOR GROVER DISEASE           BP 163/93  Pulse 102  Temp(Src) 98.3 F (36.8 C) (Oral)  Resp 18  Physical Exam  Nursing note and vitals reviewed. Constitutional: He is oriented to person, place, and time.  Uncomfortable   HENT:  Head: Normocephalic.  Mouth/Throat: Oropharynx is clear and moist.  Eyes: Conjunctivae are normal. Pupils are equal, round, and reactive to light.  Neck: Normal range of motion. Neck supple.  Cardiovascular: Normal rate, regular rhythm and normal heart sounds.   Pulmonary/Chest: Effort normal and breath sounds normal. No respiratory distress. He has no wheezes.  Abdominal: Soft.  + suprapubic tenderness with firm and distended bladder   Musculoskeletal:  Normal range of motion.  Neurological: He is alert and oriented to person, place, and time.  Skin: Skin is warm and dry.  Psychiatric: He has a normal mood and affect. His behavior is normal. Judgment and thought content normal.    ED Course  Procedures (including critical care time)  Labs Reviewed  CBC - Abnormal; Notable for the following:    WBC 3.9 (*)    RDW 18.7 (*)    Platelets 149 (*)    All other components within normal limits  BASIC METABOLIC PANEL - Abnormal; Notable for the following:    Glucose, Bld 109 (*)    GFR calc non Af Amer 77 (*)    GFR calc Af Amer 90 (*)    All other components within normal limits  URINE CULTURE  URINALYSIS, ROUTINE W REFLEX MICROSCOPIC   No results found.   No diagnosis found.    MDM  ASHAR LEWINSKI is a 79 y.o. male here with urinary retention. Dr. Margarita Grizzle at bedside and will place foley. Will check kidney function and UA.   11:28  AM Cr stable. Dr. Margarita Grizzle placed foley and its draining well. Request that I place patient on cipro x 5 days. He will f/u with urology in several days.        Richardean Canal, MD 04/16/13 [phone]

## 2013-04-17 LAB — URINE CULTURE
Colony Count: NO GROWTH
Culture: NO GROWTH

## 2013-04-25 ENCOUNTER — Telehealth: Payer: Self-pay | Admitting: Neurology

## 2013-04-25 NOTE — Telephone Encounter (Signed)
I called the patient and I spoke with the wife. The patient is having some difficulty getting to sleep at night. The patient is on trazodone 50 milligrams, and nortriptyline 30 mg at night. Initially, he said better on the nortriptyline, but this effect has worn off. He will taper off of the Pamelor by one tablet every 5 days. We can go up on the trazodone if needed for sleep.

## 2013-04-25 NOTE — Telephone Encounter (Signed)
Patient is having a hard time going to sleep and a hard staying asleep. He wants to know if he could reduce the nortriptyline to see if that would help some.

## 2013-05-10 ENCOUNTER — Encounter (HOSPITAL_COMMUNITY): Payer: Self-pay | Admitting: Emergency Medicine

## 2013-05-10 ENCOUNTER — Emergency Department (HOSPITAL_COMMUNITY)
Admission: EM | Admit: 2013-05-10 | Discharge: 2013-05-10 | Disposition: A | Discharge status: Home / Self Care | Payer: Medicare Other | Attending: Emergency Medicine | Admitting: Emergency Medicine

## 2013-05-10 DIAGNOSIS — Z79899 Other long term (current) drug therapy: Secondary | ICD-10-CM | POA: Insufficient documentation

## 2013-05-10 DIAGNOSIS — R339 Retention of urine, unspecified: Secondary | ICD-10-CM | POA: Insufficient documentation

## 2013-05-10 DIAGNOSIS — F329 Major depressive disorder, single episode, unspecified: Secondary | ICD-10-CM | POA: Insufficient documentation

## 2013-05-10 DIAGNOSIS — F411 Generalized anxiety disorder: Secondary | ICD-10-CM | POA: Insufficient documentation

## 2013-05-10 DIAGNOSIS — Z8546 Personal history of malignant neoplasm of prostate: Secondary | ICD-10-CM | POA: Insufficient documentation

## 2013-05-10 DIAGNOSIS — Z872 Personal history of diseases of the skin and subcutaneous tissue: Secondary | ICD-10-CM | POA: Insufficient documentation

## 2013-05-10 DIAGNOSIS — F3289 Other specified depressive episodes: Secondary | ICD-10-CM | POA: Insufficient documentation

## 2013-05-10 LAB — URINALYSIS, ROUTINE W REFLEX MICROSCOPIC
Bilirubin Urine: NEGATIVE
Glucose, UA: NEGATIVE mg/dL
Ketones, ur: NEGATIVE mg/dL
pH: 6.5 (ref 5.0–8.0)

## 2013-05-10 MED ORDER — LIDOCAINE HCL 2 % EX GEL
Freq: Once | CUTANEOUS | Status: AC
  Administered 2013-05-10: 10 via URETHRAL
  Filled 2013-05-10: qty 10

## 2013-05-10 NOTE — ED Provider Notes (Signed)
History    CSN: [id number] Arrival date & time 05/10/13  0118  First MD Initiated Contact with Patient 05/10/13 0122     Chief Complaint  Patient presents with  . Urinary Retention   (Consider location/radiation/quality/duration/timing/severity/associated sxs/prior Treatment) The history is provided by the patient and medical records.   patient reports urinary retention since this evening.  He is unable to void.  He called his urologist who asked that he come to the ER for evaluation.  No fevers or chills.  No recent dysuria.  No nausea or vomiting.  No other complaints.  Symptoms are mild to moderate in severity.  Nothing worsens or improves his symptoms Past Medical History  Diagnosis Date  . Grover's disease   . Depression   . Anxiety   . Vertigo   . Cancer     prostate   Past Surgical History  Procedure Laterality Date  . Shoulder surgery      right  . Knee surgery     History reviewed. No pertinent family history. History  Substance Use Topics  . Smoking status: Never Smoker   . Smokeless tobacco: Not on file  . Alcohol Use: No    Review of Systems  All other systems reviewed and are negative.    Allergies  Benadryl and Hydrocodone  Home Medications   Current Outpatient Rx  Name  Route  Sig  Dispense  Refill  . gabapentin (NEURONTIN) 400 MG capsule   Oral   Take 400 mg by mouth 2 (two) times daily.         Marland Kitchen LORazepam (ATIVAN) 1 MG tablet   Oral   Take 1 mg by mouth at bedtime.         . methotrexate 2.5 MG tablet   Oral   Take 5 mg by mouth once a week. Takes on fridays.         . nortriptyline (PAMELOR) 10 MG capsule   Oral   Take 3 capsules (30 mg total) by mouth at bedtime.   270 capsule   3   . temazepam (RESTORIL) 15 MG capsule   Oral   Take 15 mg by mouth at bedtime.         . traZODone (DESYREL) 50 MG tablet   Oral   Take 50 mg by mouth at bedtime.         . triamcinolone cream (KENALOG) 0.1 %   Topical   Apply 1  application topically every morning. APPLY TO CHEST AND BACK EVERY MORNING FOR GROVER DISEASE          BP 142/87  Pulse 91  Temp(Src) 98.5 F (36.9 C) (Oral)  Resp 20  SpO2 97% Physical Exam  Nursing note and vitals reviewed. Constitutional: He is oriented to person, place, and time. He appears well-developed and well-nourished.  HENT:  Head: Normocephalic.  Eyes: EOM are normal.  Neck: Normal range of motion.  Pulmonary/Chest: Effort normal.  Abdominal: Soft. He exhibits no distension.  Genitourinary:  Normal-appearing circumcised penis.  No penile tenderness.  No scrotal masses or tenderness.  No blood at the meatus  Musculoskeletal: Normal range of motion.  Neurological: He is alert and oriented to person, place, and time.  Psychiatric: He has a normal mood and affect.    ED Course  BLADDER CATHETERIZATION Date/Time: 05/10/2013 2:12 AM Performed by: Lyanne Co Authorized by: Lyanne Co Consent: Verbal consent obtained. Risks and benefits: risks, benefits and alternatives were discussed Consent given  by: patient Required items: required blood products, implants, devices, and special equipment available Patient identity confirmed: verbally with patient Indications: urinary retention Local anesthesia used: no Patient sedated: no Preparation: Patient was prepped and draped in the usual sterile fashion. Catheter type: coude Catheter size: 16 Fr Complicated insertion: no Altered anatomy: no Bladder irrigation: no Number of attempts: 1 Urine characteristics: clear Patient tolerance: Patient tolerated the procedure well with no immediate complications.   (including critical care time) Labs Reviewed  URINALYSIS, ROUTINE W REFLEX MICROSCOPIC   No results found. 1. Urinary retention     MDM  Foley catheter placed with resolution of urinary retention.  Discharge home with urology followup.  Home with Foley  Lyanne Co, MD 05/10/13 [phone]

## 2013-05-10 NOTE — ED Notes (Signed)
Pt presents to ED with c/o urinary retention.  Per pt, he has not voided since 9pm last night--- he feels urge to void but unable to; pt reports some abdominal pain d/t retention; pt's hypogastric area distended.

## 2013-05-31 ENCOUNTER — Encounter: Payer: Self-pay | Admitting: Nurse Practitioner

## 2013-05-31 ENCOUNTER — Ambulatory Visit (INDEPENDENT_AMBULATORY_CARE_PROVIDER_SITE_OTHER): Payer: Medicare Other | Admitting: Nurse Practitioner

## 2013-05-31 VITALS — BP 111/65 | HR 86 | Ht 73.0 in | Wt 176.0 lb

## 2013-05-31 DIAGNOSIS — R269 Unspecified abnormalities of gait and mobility: Secondary | ICD-10-CM

## 2013-05-31 DIAGNOSIS — R42 Dizziness and giddiness: Secondary | ICD-10-CM

## 2013-05-31 NOTE — Progress Notes (Signed)
I have read the note, and I agree with the clinical assessment and plan.  

## 2013-05-31 NOTE — Progress Notes (Signed)
HPI:  Jordan Terry, 79 year old right-handed white male returns for followup. He was last seen by Dr. Anne Hahn 11/22/2012. He has  a history of dizziness. The patient indicated that the dizziness is better in the morning, worse as the day goes on. The patient was felt to have a muscle tension headache, and he was started on nortriptyline. The patient has gained 80-90% improvement on this medication, and he is now able to function more normally, and drive a car. The patient denies any falls. The patient however, has reported some problems with impotence on the medication. The patient however, does not wish to stop the drug but to decrease the dose.  The patient continues to have issues with sleep at night, and he wakes up frequently at night. He continues to see Dr. Donell Beers for depression and anxiety disorder. He currently has some bladder issues and has an indwelling catheter.  ROS: Negative except for fatigue, hearing loss urination problems, rash and itching, dizziness, insomnia, depression and anxiety   Physical Exam General: well developed, well nourished, seated, in no evident distress Head: head normocephalic and atraumatic. Oropharynx benign   Neurologic Exam Mental Status: Awake and fully alert. Oriented to place and time. Follows all commands. Speech and language normal.   Cranial Nerves:  Pupils equal, briskly reactive to light. Extraocular movements full without nystagmus. Visual fields full to confrontation. Hearing decreased bil.  Face, tongue, palate move normally and symmetrically.  Motor: Normal bulk and tone. Normal strength in all tested extremity muscles.No focal weakness Coordination: Rapid alternating movements normal in all extremities. Finger-to-nose and heel-to-shin performed accurately bilaterally. Gait and Station: Arises from chair without difficulty. Stance is normal. Gait demonstrates normal stride length and balance . Able to heel, toe and tandem walk without difficulty. Romberg  negative Reflexes: 2+ and symmetric. Toes downgoing.     ASSESSMENT: History of dizziness, likely muscle tension headache.     PLAN: Decrease nortriptyline to 20 mg daily, does not need prescription Given patient information on dizziness  followup in 6 months  Nilda Riggs, GNP-BC APRN

## 2013-05-31 NOTE — Patient Instructions (Addendum)
Decrease nortriptyline to 20 mg daily, does not need prescription Given patient information on dizziness  followup in 6 months

## 2013-07-28 ENCOUNTER — Telehealth: Payer: Self-pay | Admitting: Neurology

## 2013-07-28 NOTE — Telephone Encounter (Signed)
Go to 1 tab at night for 1 week then discontinue

## 2013-07-28 NOTE — Telephone Encounter (Signed)
I relayed the message below of taper off nortriptyline.   He relayed understanding.

## 2013-07-31 ENCOUNTER — Other Ambulatory Visit: Payer: Self-pay | Admitting: Dermatology

## 2013-08-01 ENCOUNTER — Other Ambulatory Visit: Payer: Self-pay | Admitting: Urology

## 2013-08-04 ENCOUNTER — Telehealth: Payer: Self-pay | Admitting: Neurology

## 2013-08-04 ENCOUNTER — Encounter (HOSPITAL_COMMUNITY): Payer: Self-pay | Admitting: Pharmacy Technician

## 2013-08-05 NOTE — Telephone Encounter (Signed)
I called the patient. He is under a lot of stress, as his son just had a MI, and the patient is to have surgery in the near future. He wants to stay on the pamelor at 20 mg at night for now.

## 2013-08-08 ENCOUNTER — Encounter (HOSPITAL_COMMUNITY): Payer: Self-pay

## 2013-08-08 ENCOUNTER — Encounter (HOSPITAL_COMMUNITY)
Admission: RE | Admit: 2013-08-08 | Discharge: 2013-08-08 | Disposition: A | Discharge status: Home / Self Care | Payer: Medicare Other | Source: Ambulatory Visit | Attending: Urology | Admitting: Urology

## 2013-08-08 ENCOUNTER — Ambulatory Visit (HOSPITAL_COMMUNITY)
Admission: RE | Admit: 2013-08-08 | Discharge: 2013-08-08 | Disposition: A | Discharge status: Home / Self Care | Payer: Medicare Other | Source: Ambulatory Visit | Attending: Urology | Admitting: Urology

## 2013-08-08 DIAGNOSIS — Z01810 Encounter for preprocedural cardiovascular examination: Secondary | ICD-10-CM | POA: Insufficient documentation

## 2013-08-08 DIAGNOSIS — M47814 Spondylosis without myelopathy or radiculopathy, thoracic region: Secondary | ICD-10-CM | POA: Insufficient documentation

## 2013-08-08 DIAGNOSIS — C61 Malignant neoplasm of prostate: Secondary | ICD-10-CM | POA: Insufficient documentation

## 2013-08-08 DIAGNOSIS — Z01812 Encounter for preprocedural laboratory examination: Secondary | ICD-10-CM | POA: Insufficient documentation

## 2013-08-08 HISTORY — DX: Unspecified osteoarthritis, unspecified site: M19.90

## 2013-08-08 HISTORY — DX: Angina pectoris, unspecified: I20.9

## 2013-08-08 HISTORY — DX: Headache: R51

## 2013-08-08 LAB — BASIC METABOLIC PANEL
BUN: 17 mg/dL (ref 6–23)
CO2: 30 mEq/L (ref 19–32)
Chloride: 102 mEq/L (ref 96–112)
Creatinine, Ser: 1.07 mg/dL (ref 0.50–1.35)
GFR calc Af Amer: 75 mL/min — ABNORMAL LOW (ref >90)
Glucose, Bld: 100 mg/dL — ABNORMAL HIGH (ref 70–99)
Potassium: 4.1 mEq/L (ref 3.5–5.1)
Sodium: 140 mEq/L (ref 135–145)

## 2013-08-08 LAB — CBC
HCT: 40.5 % (ref 39.0–52.0)
Platelets: 172 10*3/uL (ref 150–400)
RBC: 5.06 MIL/uL (ref 4.22–5.81)
RDW: 18.4 % — ABNORMAL HIGH (ref 11.5–15.5)
WBC: 3.6 10*3/uL — ABNORMAL LOW (ref 4.0–10.5)

## 2013-08-08 NOTE — Patient Instructions (Addendum)
22 Jordan Terry  08/08/2013   Your procedure is scheduled on: 08/14/13  Report to [facility] at 12:45 PM.  Call this number if you have problems the morning of surgery [phone]: [phone]   Remember:   Do not eat food After Midnight, clear liquids from midnight until 9:15am on 08/14/13 then nothing.     Take these medicines the morning of surgery with A SIP OF WATER: neurontin   Do not wear jewelry, make-up or nail polish.  Do not wear lotions, powders, or perfumes. You may wear deodorant.  Do not shave 48 hours prior to surgery. Men may shave face and neck.  Do not bring valuables to the hospital.  Contacts, dentures or bridgework may not be worn into surgery.   Patients discharged the day of surgery will not be allowed to drive home.  Name and phone number of your driver:Ava (wife) [phone]   Please read over the following fact sheets that you were given: clear liquids fact sheet Birdie Sons, RN  pre op nurse call if needed [phone]    FAILURE TO FOLLOW THESE INSTRUCTIONS MAY RESULT IN CANCELLATION OF YOUR SURGERY   Patient Signature: ___________________________________________

## 2013-08-09 ENCOUNTER — Telehealth: Payer: Self-pay | Admitting: Neurology

## 2013-08-09 NOTE — Telephone Encounter (Signed)
I called patient. The patient is under a lot of stress, he wants to go up on the nortriptyline to 30 mg at night. I have indicated that he could do this at any time.

## 2013-08-12 NOTE — H&P (Signed)
Reason For Visit     Worsening urinary frequency   History of Present Illness   Mr. Smola is a 79 year old with the following urologic history:  1) Prostate cancer: He is s/p treatment with combination EBRT/palladium seed implantation in January 2003. He has had no evidence for cancer recurrence.  TNM stage: cT1c Nx Mx Gleason score: 3+4=7 PSA nadir: 0.08 (Mar 2007)  2) History of urethral stricture / LUTS:   Aug 2008: Dilation of urethral stricture May 2011: Balloon dilation of urethral stricture May 2013: Balloon dilation of urethral stricture April 2014: Dilation of urethral stricture April 17 2013: Balloon dilation of urethral stricture in OR May 03, 2013: seen by Dr. Laverle Patter for hematuria post catheter removal after dilation of stricture in June. Hematuria resolved. May 15, 2013: Most recent visit: self cath 3x weekly was d/c due to discomfort and blood noted at tip of catheter.  He was emptying well w/ 0 post void cath output.   3) Erectile dysfunction/Testosterone deficiency: He has used Viagra 100 mg prn successfully. He has complained of a low libido and his testosterone was 215 at baseline but does not wish any treatment.  4) Right groin/testicular pain: This has been present ever since his right inguinal hernia repair.  Interval history: Mr. Orrego returns today w/ c/o 2-wk hx of increased urinary frequency Q 1 hour and nocturia 2-3x.  He also c/o weak and slow urinary stream associated w/ increasing incontinence and sensation of incomplete bladder emptying.  Denies hematuria/dysuria.  Denies fever/chills.  Denies any other associated symptoms or alleviating/aggravating factors.   Past Medical History Problems  1. History of  Anxiety (Symptom) 300.00 2. History of  Arthritis V13.4 3. History of  Coagulation Defects 286.9 4. History of  Complete Colonoscopy 5. History of  Depression 311 6. History of  Esophageal Reflux 530.81 7. History of  Prostate Cancer  V10.46  Surgical History Problems  1. History of  Cystoscopy For Urethral Stricture 2. History of  Cystoscopy For Urethral Stricture 3. History of  Knee Surgery 4. History of  Nose Surgery  Current Meds 1. Besivance 0.6 % Ophthalmic Suspension; Therapy: 03Apr2014 to 2. Diazepam 2 MG Oral Tablet; Therapy: 09Apr2014 to 3. Durezol 0.05 % Ophthalmic Emulsion; Therapy: 03Apr2014 to 4. FluvoxaMINE Maleate 50 MG Oral Tablet; Therapy: 11Dec2012 to 5. Folic Acid TABS; Therapy: (Recorded:29May2013) to 6. Gabapentin 100 MG Oral Capsule; Therapy: 17Oct2013 to 7. Ilevro 0.3 % Ophthalmic Suspension; Therapy: 03Apr2014 to 8. Lidocaine HCl 2 % External Gel; APPLY AS DIRECTED; Therapy: 21Jul2014 to (Last  Rx:21Jul2014)  Requested for: 21Jul2014 9. LORazepam 0.5 MG Oral Tablet; Therapy: 13Feb2013 to 10. Methotrexate 2.5 MG Oral Tablet; Therapy: 12Apr2013 to 11. Nortriptyline HCl 10 MG Oral Capsule; Therapy: 06Nov2013 to 12. Sucralfate 1 GM Oral Tablet; Therapy: 09Jan2013 to 13. Tamsulosin HCl 0.4 MG Oral Capsule; TAKE 1 CAPSULE Bedtime; Therapy: 17Apr2014 to   (Evaluate:15Aug2014)  Requested for: 17Apr2014; Last Rx:17Apr2014 14. Temazepam 15 MG Oral Capsule; Therapy: 13Feb2013 to 15. Triamcinolone Acetonide 0.1 % External Cream; Therapy: (Recorded:23Sep2013) to  Allergies Medication  1. Uribel CAPS 2. Endocet TABS 3. Roxicet TABS  Family History Problems  1. Family history of  Bladder Cancer V16.52 2. Family history of  Breast Cancer V16.3 3. Family history of  Colon Cancer V16.0 4. Family history of  Ischemic Stroke V17.1 5. Family history of  Prostate Cancer V16.42  Social History Problems  1. Marital History - Currently Married 2. Never A Smoker 3. Occupation: retired Nurse, children's  4. Alcohol Use 5. Tobacco Use  Review of Systems Genitourinary system(s) were reviewed and pertinent findings if present are noted.  Genitourinary: urinary frequency, feelings of urinary urgency, nocturia,  incontinence, difficulty starting the urinary stream, weak urinary stream, urinary stream starts and stops, incomplete emptying of bladder, post-void dribbling and initiating urination requires straining, but no dysuria, no hematuria, no pelvic pain and no suprapubic pain.  Gastrointestinal: no nausea, no vomiting, no flank pain, no abdominal pain, no diarrhea and no constipation.  Constitutional: no fever.    Vitals Vital Signs [Data Includes: Last 1 Day]  06Oct2014 02:32PM  Blood Pressure: 163 / 88 Temperature: 98.6 F Heart Rate: 83  Physical Exam Constitutional: Well nourished and well developed . No acute distress.  Pulmonary: No respiratory distress.  Cardiovascular:. The arterial pulses are normal.  Abdomen: The abdomen is soft and nontender. No periumbilical tenderness is present and no suprapubic tenderness. No CVA tenderness.    Results/Data  The following images/tracing/specimen were independently visualized:  PVR = .  PVR: Ultrasound PVR 241 ml.    Procedure  Patient attempted to do self cath today but unable to insert the catheter due to resistance in the urethra.  I also attempted to insert catheter but also noted resistance at the bulbar section of the urethra.   Assessment Assessed  1. Urethral Stricture 598.9 2. Incomplete Emptying Of Bladder 788.21   He did not leave a urine sample today but will stop by tomorrow to give a sample to rule out UTI which could cause worsening of frequency.  However, he does have a recurrence of stricture which needs intervention since PVR and is affecting his ADLs.   Plan Incomplete Emptying Of Bladder (788.21)  1. PVR U/S  Done: 06Oct2014  Discussion/Summary  I consulted w/ Dr. Laverle Patter, patient's urologist, regarding the recurrence of stricture and relayed his msg to patient: emergency dilation w/ filiform in clinic or OR in 2 weeks for a formal balloon dilation of stricture.  Also relayed msg to patient that is  acceptable to wait for 2 weeks.  Patient is would like to wait for OR for sedation since it's too painful for him to have dilation done in clinic.  We also discussed the recommendation post dilation to keep stricture open : self cath QHS then decrease to every other night.  Patient has done self cath before and is amendable to self cath QHS after the dilation procedure in OR.   Signatures Electronically signed by : Seward Grater, Dyann Ruddle; Jul 31 2013  5:17PM

## 2013-08-14 ENCOUNTER — Encounter (HOSPITAL_COMMUNITY)
Admission: RE | Disposition: A | Discharge status: Home / Self Care | Payer: Self-pay | Source: Ambulatory Visit | Attending: Urology

## 2013-08-14 ENCOUNTER — Ambulatory Visit (HOSPITAL_COMMUNITY): Payer: Medicare Other | Admitting: Anesthesiology

## 2013-08-14 ENCOUNTER — Encounter (HOSPITAL_COMMUNITY): Payer: Medicare Other | Admitting: Anesthesiology

## 2013-08-14 ENCOUNTER — Ambulatory Visit (HOSPITAL_COMMUNITY)
Admission: RE | Admit: 2013-08-14 | Discharge: 2013-08-14 | Disposition: A | Discharge status: Home / Self Care | Payer: Medicare Other | Source: Ambulatory Visit | Attending: Urology | Admitting: Urology

## 2013-08-14 ENCOUNTER — Encounter (HOSPITAL_COMMUNITY): Payer: Self-pay | Admitting: *Deleted

## 2013-08-14 DIAGNOSIS — F3289 Other specified depressive episodes: Secondary | ICD-10-CM | POA: Insufficient documentation

## 2013-08-14 DIAGNOSIS — C61 Malignant neoplasm of prostate: Secondary | ICD-10-CM | POA: Insufficient documentation

## 2013-08-14 DIAGNOSIS — R339 Retention of urine, unspecified: Secondary | ICD-10-CM | POA: Insufficient documentation

## 2013-08-14 DIAGNOSIS — F329 Major depressive disorder, single episode, unspecified: Secondary | ICD-10-CM | POA: Insufficient documentation

## 2013-08-14 DIAGNOSIS — R350 Frequency of micturition: Secondary | ICD-10-CM | POA: Insufficient documentation

## 2013-08-14 DIAGNOSIS — N509 Disorder of male genital organs, unspecified: Secondary | ICD-10-CM | POA: Insufficient documentation

## 2013-08-14 DIAGNOSIS — N35919 Unspecified urethral stricture, male, unspecified site: Secondary | ICD-10-CM | POA: Insufficient documentation

## 2013-08-14 DIAGNOSIS — Z79899 Other long term (current) drug therapy: Secondary | ICD-10-CM | POA: Insufficient documentation

## 2013-08-14 DIAGNOSIS — N529 Male erectile dysfunction, unspecified: Secondary | ICD-10-CM | POA: Insufficient documentation

## 2013-08-14 DIAGNOSIS — K219 Gastro-esophageal reflux disease without esophagitis: Secondary | ICD-10-CM | POA: Insufficient documentation

## 2013-08-14 HISTORY — PX: CYSTOSCOPY WITH URETHRAL DILATATION: SHX5125

## 2013-08-14 SURGERY — CYSTOSCOPY, WITH URETHRAL DILATION
Anesthesia: General | Site: Urethra | Wound class: Clean Contaminated

## 2013-08-14 MED ORDER — FENTANYL CITRATE 0.05 MG/ML IJ SOLN
25.0000 ug | INTRAMUSCULAR | Status: DC | PRN
  Administered 2013-08-14 (×3): 50 ug via INTRAVENOUS

## 2013-08-14 MED ORDER — LIDOCAINE HCL (CARDIAC) 20 MG/ML IV SOLN
INTRAVENOUS | Status: DC | PRN
  Administered 2013-08-14: 30 mg via INTRAVENOUS

## 2013-08-14 MED ORDER — PROPOFOL 10 MG/ML IV BOLUS
INTRAVENOUS | Status: DC | PRN
  Administered 2013-08-14: 150 mg via INTRAVENOUS

## 2013-08-14 MED ORDER — PROMETHAZINE HCL 25 MG/ML IJ SOLN: 6.2500 mg | INTRAMUSCULAR | Status: DC | PRN

## 2013-08-14 MED ORDER — MEPERIDINE HCL 50 MG/ML IJ SOLN: 6.2500 mg | INTRAMUSCULAR | Status: DC | PRN

## 2013-08-14 MED ORDER — ONDANSETRON HCL 4 MG/2ML IJ SOLN
INTRAMUSCULAR | Status: DC | PRN
  Administered 2013-08-14 (×2): 2 mg via INTRAVENOUS

## 2013-08-14 MED ORDER — CIPROFLOXACIN IN D5W 400 MG/200ML IV SOLN
400.0000 mg | INTRAVENOUS | Status: AC
  Administered 2013-08-14: 400 mg via INTRAVENOUS

## 2013-08-14 MED ORDER — FENTANYL CITRATE 0.05 MG/ML IJ SOLN
INTRAMUSCULAR | Status: DC | PRN
  Administered 2013-08-14 (×2): 50 ug via INTRAVENOUS

## 2013-08-14 MED ORDER — FENTANYL CITRATE 0.05 MG/ML IJ SOLN
INTRAMUSCULAR | Status: AC
  Filled 2013-08-14: qty 2

## 2013-08-14 MED ORDER — STERILE WATER FOR IRRIGATION IR SOLN
Status: DC | PRN
  Administered 2013-08-14: 3000 mL

## 2013-08-14 MED ORDER — LACTATED RINGERS IV SOLN
INTRAVENOUS | Status: DC
  Administered 2013-08-14: 1000 mL via INTRAVENOUS

## 2013-08-14 MED ORDER — LIDOCAINE HCL 2 % EX GEL
CUTANEOUS | Status: AC
  Filled 2013-08-14: qty 10

## 2013-08-14 MED ORDER — PHENYLEPHRINE HCL 10 MG/ML IJ SOLN
INTRAMUSCULAR | Status: DC | PRN
  Administered 2013-08-14: 20 ug via INTRAVENOUS

## 2013-08-14 MED ORDER — LACTATED RINGERS IV SOLN
INTRAVENOUS | Status: DC | PRN
  Administered 2013-08-14: 15:00:00 via INTRAVENOUS

## 2013-08-14 MED ORDER — CIPROFLOXACIN IN D5W 400 MG/200ML IV SOLN
INTRAVENOUS | Status: AC
  Filled 2013-08-14: qty 200

## 2013-08-14 MED ORDER — 0.9 % SODIUM CHLORIDE (POUR BTL) OPTIME
TOPICAL | Status: DC | PRN
  Administered 2013-08-14: 1000 mL

## 2013-08-14 MED ORDER — EPHEDRINE SULFATE 50 MG/ML IJ SOLN
INTRAMUSCULAR | Status: DC | PRN
  Administered 2013-08-14: 10 mg via INTRAVENOUS

## 2013-08-14 SURGICAL SUPPLY — 24 items
BAG URINE DRAINAGE (UROLOGICAL SUPPLIES) ×1 IMPLANT
BAG URO CATCHER STRL LF (DRAPE) ×2 IMPLANT
BALLN NEPHROSTOMY (BALLOONS) ×2
BALLOON NEPHROSTOMY (BALLOONS) IMPLANT
CATH FOLEY 2WAY SLVR  5CC 20FR (CATHETERS) ×1
CATH FOLEY 2WAY SLVR 5CC 20FR (CATHETERS) ×1 IMPLANT
CATH ROBINSON RED A/P 16FR (CATHETERS) IMPLANT
CLOTH BEACON ORANGE TIMEOUT ST (SAFETY) ×2 IMPLANT
DRAPE CAMERA CLOSED 9X96 (DRAPES) ×2 IMPLANT
GLOVE BIOGEL M STRL SZ7.5 (GLOVE) ×2 IMPLANT
GLOVE SURG SS PI 8.5 STRL IVOR (GLOVE) ×2
GLOVE SURG SS PI 8.5 STRL STRW (GLOVE) IMPLANT
GOWN PREVENTION PLUS LG XLONG (DISPOSABLE) ×4 IMPLANT
GOWN STRL REIN 2XL XLG LVL4 (GOWN DISPOSABLE) ×1 IMPLANT
GUIDEWIRE STR DUAL SENSOR (WIRE) ×1 IMPLANT
HOLDER FOLEY CATH W/STRAP (MISCELLANEOUS) ×1 IMPLANT
MANIFOLD NEPTUNE II (INSTRUMENTS) ×2 IMPLANT
NDL SAFETY ECLIPSE 18X1.5 (NEEDLE) IMPLANT
NEEDLE HYPO 18GX1.5 SHARP (NEEDLE)
NEEDLE HYPO 22GX1.5 SAFETY (NEEDLE) IMPLANT
NS IRRIG 1000ML POUR BTL (IV SOLUTION) ×1 IMPLANT
PACK CYSTO (CUSTOM PROCEDURE TRAY) ×2 IMPLANT
TUBING CONNECTING 10 (TUBING) IMPLANT
WATER STERILE IRR 3000ML UROMA (IV SOLUTION) ×2 IMPLANT

## 2013-08-14 NOTE — Op Note (Signed)
Preoperative diagnosis:  1. Urethral stricture  Postoperative diagnosis: 1. Urethral stricture  Procedure(s): 1. Cystoscopy 2. Balloon dilation of urethral stricture  Surgeon: Dr. Rolly Salter, Jr  Anesthesia: General  Complications: None  EBL: Minimal  Intraoperative findings: Jordan Terry was noted to have a pinpoint urethral stricture in the bulbar urethra.  Indication: Jordan Terry is a 79 year old with a history of recurrent bulbar urethral stricture. Jordan Terry recently presented with worsening voiding symptoms and Jordan Terry elected to proceed with balloon dilation of his urethral stricture. I discussed the potential benefits and risks of the procedure, side effects of the proposed treatment, the likelihood of the patient achieving the goals of the procedure, and any potential problems that might occur during the procedure or recuperation.   Description of procedure:  The patient was taken to the operating room and a general anesthetic was administered.  Jordan Terry was given preoperative antibiotics, prepped and draped in the usual sterile fashion and placed in dorsal lithotomy.  A preoperative time out was performed.    Cystourethroscopy was performed which demonstrated a a bulbar urethral stricture as has previously been noted. A 0.38 guidewire was inserted through the stricture without difficulty and the Ultraxx nephrostomy balloon was used to dilate the stricture to 24 Fr at 18 atm of pressure for 5 minutes. The balloon was then deflated and cystoscopy was again performed.  The 22 Fr cystoscope was able to easily navigate the urethra and the bladder was examined and was unremarkable without tumors or other mucosal abnormalities. A 20 Fr Foley catheter was inserted.  The patient tolerated the procedure well and without complications.

## 2013-08-14 NOTE — Progress Notes (Signed)
Foley Catheter converted to leg bag, pt. And pt's wife verbalized understanding and teach back given to RN.

## 2013-08-14 NOTE — Interval H&P Note (Signed)
History and Physical Interval Note:  08/14/2013 2:41 PM  Jordan Terry  has presented today for surgery, with the diagnosis of URETHRAL STRICTURE  The various methods of treatment have been discussed with the patient and family. After consideration of risks, benefits and other options for treatment, the patient has consented to  Procedure(s) with comments: CYSTOSCOPY WITH URETHRAL DILATATION BALLOON DILATION OF URETHRAL STRICTURE (N/A) - BALLOON DILATION  as a surgical intervention .  The patient's history has been reviewed, patient examined, no change in status, stable for surgery.  I have reviewed the patient's chart and labs.  Questions were answered to the patient's satisfaction.     Chanae Gemma,LES

## 2013-08-14 NOTE — Anesthesia Preprocedure Evaluation (Addendum)
Anesthesia Evaluation  Patient identified by MRN, date of birth, ID band Patient awake    Reviewed: Allergy & Precautions, H&P , NPO status , Patient's Chart, lab work & pertinent test results  Airway       Dental  (+) Dental Advisory Given   Pulmonary neg pulmonary ROS,          Cardiovascular + angina     Neuro/Psych  Headaches, PSYCHIATRIC DISORDERS Anxiety    GI/Hepatic negative GI ROS, Neg liver ROS,   Endo/Other  negative endocrine ROS  Renal/GU negative Renal ROS     Musculoskeletal negative musculoskeletal ROS (+)   Abdominal   Peds  Hematology negative hematology ROS (+)   Anesthesia Other Findings   Reproductive/Obstetrics                          Anesthesia Physical Anesthesia Plan  ASA: II  Anesthesia Plan: General   Post-op Pain Management:    Induction: Intravenous  Airway Management Planned: LMA  Additional Equipment:   Intra-op Plan:   Post-operative Plan: Extubation in OR  Informed Consent: I have reviewed the patients History and Physical, chart, labs and discussed the procedure including the risks, benefits and alternatives for the proposed anesthesia with the patient or authorized representative who has indicated his/her understanding and acceptance.   Dental advisory given  Plan Discussed with: CRNA  Anesthesia Plan Comments:         Anesthesia Quick Evaluation

## 2013-08-14 NOTE — Transfer of Care (Signed)
Immediate Anesthesia Transfer of Care Note  Patient: Jordan Terry  Procedure(s) Performed: Procedure(s) with comments: CYSTOSCOPY WITH URETHRAL DILATATION BALLOON DILATION OF URETHRAL STRICTURE (N/A) - BALLOON DILATION   Patient Location: PACU  Anesthesia Type:General  Level of Consciousness: awake, alert , oriented and patient cooperative  Airway & Oxygen Therapy: Patient Spontanous Breathing and Patient connected to face mask oxygen  Post-op Assessment: Report given to PACU RN and Post -op Vital signs reviewed and stable  Post vital signs: stable  Complications: No apparent anesthesia complications

## 2013-08-14 NOTE — Anesthesia Postprocedure Evaluation (Signed)
Anesthesia Post Note  Patient: Jordan Terry  Procedure(s) Performed: Procedure(s) (LRB): CYSTOSCOPY WITH URETHRAL DILATATION BALLOON DILATION OF URETHRAL STRICTURE (N/A)  Anesthesia type: General  Patient location: PACU  Post pain: Pain level controlled  Post assessment: Post-op Vital signs reviewed  Last Vitals: BP 128/77  Pulse 74  Temp(Src) 36.1 C (Oral)  Resp 16  SpO2 99%  Post vital signs: Reviewed  Level of consciousness: sedated  Complications: No apparent anesthesia complications

## 2013-08-15 ENCOUNTER — Encounter (HOSPITAL_COMMUNITY): Payer: Self-pay | Admitting: Urology

## 2013-11-30 ENCOUNTER — Ambulatory Visit (INDEPENDENT_AMBULATORY_CARE_PROVIDER_SITE_OTHER): Payer: Medicare Other | Admitting: Nurse Practitioner

## 2013-11-30 ENCOUNTER — Encounter: Payer: Self-pay | Admitting: Nurse Practitioner

## 2013-11-30 ENCOUNTER — Encounter (INDEPENDENT_AMBULATORY_CARE_PROVIDER_SITE_OTHER): Payer: Self-pay

## 2013-11-30 VITALS — BP 140/81 | HR 90 | Ht 72.5 in | Wt 183.0 lb

## 2013-11-30 DIAGNOSIS — R269 Unspecified abnormalities of gait and mobility: Secondary | ICD-10-CM

## 2013-11-30 DIAGNOSIS — R42 Dizziness and giddiness: Secondary | ICD-10-CM

## 2013-11-30 MED ORDER — NORTRIPTYLINE HCL 10 MG PO CAPS: 20.0000 mg | ORAL_CAPSULE | Freq: Every day | ORAL | Status: DC

## 2013-11-30 NOTE — Patient Instructions (Signed)
Decrease Nortriptyline to 20mg  hs, will renew mail order RX Walk for overall exercise and well being F/U in 6 months

## 2013-11-30 NOTE — Progress Notes (Signed)
[facility]  PATIENT: Jordan Terry DOB: 02/07/1932   REASON FOR VISIT: follow up for dizziness   HISTORY OF PRESENT ILLNESS:Mr Zellars, 80 year old returns for followup. He has a history of gait disorder and dizziness. He also has a history of anxiety and depression treated by Dr. Casimiro Needle. He was also felt to have a mild muscle tension headache and was placed on nortriptyline and he gained improvement on this medication however he wonders if he is taking too much medication at nighttime. He also takes Ativan and temazepam along with his nortriptyline and Gabapentin. He has not fallen and he does not use an assistive device .He returns for reevaluation   HISTORY: He has a history of dizziness. The patient indicated that the dizziness is better in the morning, worse as the day goes on. The patient was felt to have a muscle tension headache, and he was started on nortriptyline. The patient has gained 80-90% improvement on this medication, and he is now able to function more normally, and drive a car. The patient denies any falls. The patient however, has reported some problems with impotence on the medication. The patient however, does not wish to stop the drug but to decrease the dose. The patient continues to have issues with sleep at night, and he wakes up frequently at night. He continues to see Dr. Casimiro Needle for depression and anxiety disorder. He currently has some bladder issues and has an indwelling catheter.   REVIEW OF SYSTEMS: Full 14 system review of systems performed and notable only for those listed, all others are neg:  Constitutional: N/A  Cardiovascular: N/A  Ear/Nose/Throat: Hearing loss  Skin: N/A  Eyes: N/A  Respiratory: N/A  Gastroitestinal: N/A Genitourinary:  Urinary frequency Hematology/Lymphatic: N/A  Endocrine: N/A Musculoskeletal:N/A  Allergy/Immunology: N/A  Neurological: Dizziness Psychiatric: Depression anxiety   ALLERGIES: Allergies    Allergen Reactions  . Benadryl [Diphenhydramine Hcl]     MAKES HIM GO CRAZY  . Hydrocodone Itching    Small amounts are okay    HOME MEDICATIONS: Outpatient Prescriptions Prior to Visit  Medication Sig Dispense Refill  . folic acid (FOLVITE) 1 MG tablet Take 1 mg by mouth daily. Six days a week. Pt takes every day but on sat.      . gabapentin (NEURONTIN) 100 MG capsule Take 200 mg by mouth. Taking 300 mg in am and pm and 200 mg in afternoon.      Marland Kitchen LORazepam (ATIVAN) 0.5 MG tablet Take 1 mg by mouth at bedtime.      . methotrexate 2.5 MG tablet Take 12.5 mg by mouth every Saturday.       . nortriptyline (PAMELOR) 10 MG capsule Take 30 mg by mouth at bedtime.       Marland Kitchen oxyCODONE-acetaminophen (PERCOCET) 7.5-325 MG per tablet Take 0.5-1 tablets by mouth every 8 (eight) hours as needed for pain.      Marland Kitchen temazepam (RESTORIL) 15 MG capsule Take 15 mg by mouth at bedtime.      . triamcinolone cream (KENALOG) 0.1 % Apply 1 application topically every morning. 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    PAST MEDICAL HISTORY: Past Medical History  Diagnosis Date  . Grover's disease   . Anxiety   . Vertigo   . Cancer     prostate  . Anginal pain 20 years ago  . Headache(784.0)  not since retired  . Arthritis     PAST SURGICAL HISTORY: Past Surgical History  Procedure Laterality Date  . Shoulder surgery  2012    right  . Knee surgery  1990    "scope"  . Cardiac catheterization  "20 years ago"  . Radioactive seed implant  2002    "for prostate cancer"  . Carpal tunnel release Left 2008  . Nose surgery  1991, 1998  . Thyroidectomy, partial  1974  . Vasectomy  1970  . Cataract extraction Bilateral 2014  . Cystoscopy with urethral dilatation N/A 08/14/2013    Procedure: CYSTOSCOPY WITH URETHRAL DILATATION BALLOON DILATION OF URETHRAL STRICTURE;  Surgeon: Dutch Gray, MD;  Location: [facility];  Service:  Urology;  Laterality: N/A;  BALLOON DILATION     FAMILY HISTORY: No family history on file.  SOCIAL HISTORY: History   Social History  . Marital Status: Married    Spouse Name: [name]    Number of Children: 3  . Years of Education: college   Occupational History  . Not on file.   Social History Main Topics  . Smoking status: Never Smoker   . Smokeless tobacco: Never Used  . Alcohol Use: No  . Drug Use: No  . Sexual Activity: Not on file   Other Topics Concern  . Not on file   Social History Narrative   Patient is married [name]) and lives at home with his wife.   Patient has three children.   Patient is retired.   Patient has a college education in [place].   Patient is right-handed.   Patient drinks one cup of coffee daily and one 8 oz of tea daily.     PHYSICAL EXAM  Filed Vitals:   11/30/13 0842  BP: 140/81  Pulse: 90  Height: 6' 0.5" (1.842 m)  Weight: 183 lb (83.008 kg)   Body mass index is 24.46 kg/(m^2).  Generalized: Well developed, in no acute distress  Head: normocephalic and atraumatic,. Oropharynx benign  Neck: Supple, no carotid bruits  Cardiac: Regular rate rhythm, no murmur  Neurological examination   Mentation: Alert oriented to time, place, history taking. Follows all commands speech and language fluent  Cranial nerve II-XII: Pupils were equal round reactive to light extraocular movements were full, visual field were full on confrontational test. Facial sensation and strength were normal. hearing was intact to finger rubbing bilaterally. Uvula tongue midline. head turning and shoulder shrug were normal and symmetric.Tongue protrusion into cheek strength was normal. Motor: normal bulk and tone, full strength in the BUE, BLE, fine finger movements normal, no pronator drift. No focal weakness Coordination: finger-nose-finger, heel-to-shin bilaterally, no dysmetria Reflexes: Brachioradialis 2/2, biceps 2/2, triceps 2/2, patellar 2/2, Achilles  2/2, plantar responses were flexor bilaterally. Gait and Station: Rising up from seated position without assistance, normal stance,  moderate stride, good arm swing, smooth turning, able to perform tiptoe, and heel walking without difficulty. Tandem gait  is steady. Romberg negative  DIAGNOSTIC DATA (LABS, IMAGING, TESTING) - I reviewed patient records, labs, notes, testing and imaging myself where available.  Lab Results  Component Value Date   WBC 3.6* 08/08/2013   HGB 13.1 08/08/2013   HCT 40.5 08/08/2013   MCV 80.0 08/08/2013   PLT 172 08/08/2013      Component Value Date/Time   NA 140 08/08/2013 1100   K 4.1 08/08/2013 1100   CL 102 08/08/2013 1100   CO2 30 08/08/2013 1100   GLUCOSE 100* 08/08/2013 1100  BUN 17 08/08/2013 1100   CREATININE 1.07 08/08/2013 1100   CALCIUM 9.7 08/08/2013 1100   PROT 6.6 07/05/2011 0805   ALBUMIN 3.7 07/05/2011 0805   AST 10 07/05/2011 0805   ALT 7 07/05/2011 0805   ALKPHOS 54 07/05/2011 0805   BILITOT 0.5 07/05/2011 0805   GFRNONAA 64* 08/08/2013 1100   GFRAA 75* 08/08/2013 1100       ASSESSMENT AND PLAN  80 y.o. year old male  has a past medical history of dizziness, likely muscle tension headaches.  Decrease Nortriptyline to 20mg  hs, will renew mail order RX. (He also takes temazepam, gabapentin,and ativan at hs.) Walk for overall exercise and well being F/U in 6 months Dennie Bible, [facility], [facility], APRN  [facility] [address] [place], [place] [address] [phone]

## 2013-11-30 NOTE — Progress Notes (Signed)
Please call the patient. The blood work results are unremarkable. Thank you.  

## 2013-12-08 ENCOUNTER — Telehealth: Payer: Self-pay | Admitting: *Deleted

## 2013-12-08 NOTE — Telephone Encounter (Signed)
Carolyn sent this Rx 02/05.  I called [facility] spoke with Hoyle Sauer.  She reviewed the patients notes and said they contacted the patient to advise them this Rx was too soon to fill, so they are holding it until ins will pay again.  (They would not disclose the fill date to me).  She said they were trying to reach out to another provider regarding a different medication, which we do not prescribe, and they are pending a return call from them, so perhaps the patient was confused.  I called the patient back.  Relayed the info I was provided.  He said he never actualy spoke with anyone at [facility], so he did not know what the issue was, just wanted [place] to call and see why there was a delay.

## 2014-01-29 ENCOUNTER — Other Ambulatory Visit: Payer: Self-pay | Admitting: Dermatology

## 2014-03-14 ENCOUNTER — Telehealth: Payer: Self-pay | Admitting: *Deleted

## 2014-03-14 MED ORDER — NORTRIPTYLINE HCL 10 MG PO CAPS: 30.0000 mg | ORAL_CAPSULE | Freq: Every day | ORAL | Status: DC

## 2014-03-14 NOTE — Telephone Encounter (Signed)
I called patient, talked with the wife. The patient is being treated for muscle tension headaches. He went down on the nortriptyline that he was seen 2 or 3 months ago, and his symptoms have worsened. The dizziness sensation is better in the morning, worse as the day goes on. The wife believes that he felt better on 30 mg at night of the nortriptyline. I will go back to this.

## 2014-03-14 NOTE — Telephone Encounter (Signed)
Message copied by Oliver Hum on Wed Mar 14, 2014 10:51 AM ------      Message from: Dory Horn      Created: Wed Mar 14, 2014 10:30 AM      Regarding: Medication problem       Pt mistakenly calls sleep lab, he is a patient of Dr. Jannifer Franklin and seems to be having trouble with his medication Nortriptyline.  I have forwarded this to your voicemail.  Thanks [place] ------

## 2014-05-02 ENCOUNTER — Other Ambulatory Visit: Payer: Self-pay | Admitting: Urology

## 2014-05-03 ENCOUNTER — Encounter (HOSPITAL_COMMUNITY): Payer: Self-pay | Admitting: *Deleted

## 2014-05-03 ENCOUNTER — Encounter (HOSPITAL_COMMUNITY): Payer: Self-pay | Admitting: Pharmacy Technician

## 2014-05-03 NOTE — Progress Notes (Signed)
Chest x ray,ekg 10/14 epic

## 2014-05-04 NOTE — H&P (Signed)
Reason For Visit   [name] presents today with difficulty catheterizing   History of Present Illness    [name] is a 80 year old with the following urologic history:    1) Prostate cancer: He is s/p treatment with combination EBRT/palladium seed implantation in January 2003. He has had no evidence for cancer recurrence.    TNM stage: cT1c Nx Mx  Gleason score: 3+4=7  PSA nadir: 0.08 (Mar 2007)    2) Erectile dysfunction/Testosterone deficiency: He has used Viagra 100 mg prn successfully. He has complained of a low libido and his testosterone was 215 at baseline. Dr. Alinda Terry discussed the potential risks and benefits of testosterone replacement in the setting of a history of prostate cancer and he has not yet wished to proceed with treatment.     3) History of urethral stricture / LUTS: He developed urinary retention in August 2008 and did undergo catheter placement after balloon dilation of his urethral stricture by Dr. Matilde Terry in the emergency department. He subsequently passed a voiding trial. He developed worsening urinary urgency and frequency in January 2011 and was found to have a recurrent stricture and underwent repeat balloon dilation in May 2011. In May 2012, he developed nocturnal incontinence after shoulder surgery. Interestingly, he did not have any daytime symptoms. His symptoms ultimately resolved spontaneously. He has had intermittent recurrence of his urethral stricture requiring dilation and catheter placement.    Aug 2008: Dilation of urethral stricture  May 2011: Balloon dilation of urethral stricture  May 2013: Balloon dilation of urethral stricture  April 2014: Dilation of urethral stricture  June 2014: Balloon dilation of urethral stricture  Oct 2014: Balloon dilation of urethral stricture    4) Right groin/testicular pain: This has been present ever since his right inguinal hernia repair.    Interval history: [name] presents today with  difficulty catheterizing. He performs this once per week in order to prevent a recurrent stricture. He ran out of catheter and had these refilled. His new catheters are "green" and reportedly less rigid than his previous catheters. He is unable to insert them. He has tried 5 times and had gross bleeding and discomfort each time. He otherwise states he is voiding well. He denies bothersome LUTS, dysuria, or gross hematuria (aside from voiding after attempting CIC).   Past Medical History Problems  1. History of Anxiety (300.00) 2. History of Arthritis (V13.4) 3. History of Coagulation Defects (286.9) 4. History of depression (V11.8) 5. History of esophageal reflux (V12.79) 6. Personal history of prostate cancer (V10.46)  Surgical History Problems  1. History of Complete Colonoscopy 2. History of Cystoscopy For Urethral Stricture 3. History of Cystoscopy For Urethral Stricture 4. History of Cystoscopy For Urethral Stricture 5. History of Knee Surgery 6. History of Nose Surgery  Current Meds 1. Folic Acid TABS;  Therapy: (Recorded:29May2013) to Recorded 2. Gabapentin 100 MG Oral Capsule;  Therapy: [phone] to Recorded 3. Ilevro 0.3 % Ophthalmic Suspension;  Therapy: 03Apr2014 to Recorded 4. Lidocaine HCl - 2 % External Gel; APPLY AS DIRECTED;  Therapy: [id number] to (Last Rx:21Jul2014)  Requested for: 21Jul2014 Ordered 5. LORazepam 0.5 MG Oral Tablet;  Therapy: [id number] to Recorded 6. Methotrexate 2.5 MG Oral Tablet;  Therapy: 12Apr2013 to Recorded 7. Nortriptyline HCl - 10 MG Oral Capsule;  Therapy: [id number] to Recorded 8. Oxycodone-Acetaminophen 7.5-325 MG Oral Tablet;  Therapy: (Recorded:27Oct2014) to Recorded 9. Temazepam 15 MG Oral Capsule;  Therapy: [id number] to Recorded 10. Triamcinolone Acetonide 0.1 % External Cream;  Therapy: (Recorded:23Sep2013) to Recorded  Allergies Medication  1. Hydrocodone-Acetaminophen CAPS 2. Uribel CAPS 3. Benadryl TABS 4. Endocet  TABS 5. Roxicet TABS  Family History Problems  1. Family history of Bladder Cancer (V16.52) 2. Family history of Breast Cancer (V16.3) 3. Family history of Colon Cancer (V16.0) 4. Family history of colon cancer (V16.0) : Father 5. Family history of Ischemic Stroke (V17.1) 6. Family history of Prostate Cancer (V16.42)  Social History Problems  1. Denied: Alcohol Use 2. Marital History - Currently Married 3. Never A Smoker 4. Occupation:   retired 48. Denied: Tobacco Use  Review of Systems Genitourinary, constitutional and gastrointestinal system(s) were reviewed and pertinent findings if present are noted.    Vitals Vital Signs [Data Includes: Last 1 Day]  Recorded: [id number] 09:26AM  Height: 6 ft 2 in Weight: 185 lb  BMI Calculated: 23.75 BSA Calculated: 2.1 Blood Pressure: 133 / 74 Temperature: 97.9 F Heart Rate: 80  Physical Exam Constitutional: Well nourished and well developed . No acute distress.  Pulmonary: No respiratory distress and normal respiratory rhythm and effort.  Abdomen: The abdomen is soft and nontender. No CVA tenderness.  Genitourinary: Examination of the penis demonstrates a normal meatus.  Neuro/Psych:. Mood and affect are appropriate.    Results/Data  01 May 2014 10:39 AM  UA With REFLEX    COLOR YELLOW     APPEARANCE CLEAR     SPECIFIC GRAVITY 1.015     pH 6.0     GLUCOSE NEG     BILIRUBIN NEG     KETONE NEG     BLOOD LARGE     PROTEIN NEG     UROBILINOGEN 0.2     NITRITE NEG     LEUKOCYTE ESTERASE NEG     SQUAMOUS EPITHELIAL/HPF RARE     WBC NONE SEEN     CRYSTALS NONE SEEN     CASTS NONE SEEN     RBC 7-10     BACTERIA RARE   The following images/tracing/specimen were independently visualized: . UA: sent for culture  PVR: Ultrasound PVR 0 ml.    Procedure Using sterile technique by the nursing staff an attempt was made to place a 16 fr single use catheter as well as a 16 fr coude catheter. Attempts were unsuccessful and  resulted in gross bleeding from the urethra.   Assessment Assessed  1. Urethral stricture (598.9)  We gave [name] a 16 fr rigid single use catheter like he has used previously. We encouraged him to catheterize himself and this was not successful. The nursing staff made two attempts which were also unsuccessful. After discussion with Dr. Alinda Terry we decided to stop our attempts and he will take [name] to the OR for a cysto and possible dilation. I did have [name] void prior to leaving our office. A sample of this specimen will be sent for culture. Post void residual was 0 cc and he reported he voided without difficulty, pain or gross bleeding.   Plan Gross hematuria  1. URINE CULTURE; Status:Resulted - Requires Verification;   Done: [id number] 11:07AM  1. No further CIC by [name]  2. Schedule cysto in OR with Dr. Alinda Terry  3. Urine will be cultured to rule out pre op UTI - No growth.  Signatures Electronically signed by : Anders Grant, NP-C; May 02 2014  2:42PM EST

## 2014-05-07 ENCOUNTER — Encounter (HOSPITAL_COMMUNITY): Payer: Medicare Other | Admitting: Anesthesiology

## 2014-05-07 ENCOUNTER — Ambulatory Visit (HOSPITAL_COMMUNITY): Payer: Medicare Other | Admitting: Anesthesiology

## 2014-05-07 ENCOUNTER — Ambulatory Visit (HOSPITAL_COMMUNITY)
Admission: RE | Admit: 2014-05-07 | Discharge: 2014-05-07 | Disposition: A | Discharge status: Home / Self Care | Payer: Medicare Other | Source: Ambulatory Visit | Attending: Urology | Admitting: Urology

## 2014-05-07 ENCOUNTER — Encounter (HOSPITAL_COMMUNITY)
Admission: RE | Disposition: A | Discharge status: Home / Self Care | Payer: Self-pay | Source: Ambulatory Visit | Attending: Urology

## 2014-05-07 ENCOUNTER — Encounter (HOSPITAL_COMMUNITY): Payer: Self-pay | Admitting: Certified Registered Nurse Anesthetist

## 2014-05-07 DIAGNOSIS — Z79899 Other long term (current) drug therapy: Secondary | ICD-10-CM | POA: Diagnosis not present

## 2014-05-07 DIAGNOSIS — F329 Major depressive disorder, single episode, unspecified: Secondary | ICD-10-CM | POA: Diagnosis not present

## 2014-05-07 DIAGNOSIS — R310 Gross hematuria: Secondary | ICD-10-CM | POA: Insufficient documentation

## 2014-05-07 DIAGNOSIS — N35919 Unspecified urethral stricture, male, unspecified site: Secondary | ICD-10-CM | POA: Insufficient documentation

## 2014-05-07 DIAGNOSIS — K219 Gastro-esophageal reflux disease without esophagitis: Secondary | ICD-10-CM | POA: Diagnosis not present

## 2014-05-07 DIAGNOSIS — F411 Generalized anxiety disorder: Secondary | ICD-10-CM | POA: Diagnosis not present

## 2014-05-07 DIAGNOSIS — F3289 Other specified depressive episodes: Secondary | ICD-10-CM | POA: Diagnosis not present

## 2014-05-07 DIAGNOSIS — C61 Malignant neoplasm of prostate: Secondary | ICD-10-CM | POA: Diagnosis not present

## 2014-05-07 HISTORY — PX: CYSTOSCOPY WITH URETHRAL DILATATION: SHX5125

## 2014-05-07 LAB — BASIC METABOLIC PANEL
Anion gap: 10 (ref 5–15)
BUN: 12 mg/dL (ref 6–23)
CO2: 27 mEq/L (ref 19–32)
CREATININE: 0.95 mg/dL (ref 0.50–1.35)
Calcium: 9.2 mg/dL (ref 8.4–10.5)
Chloride: 104 mEq/L (ref 96–112)
GFR calc non Af Amer: 77 mL/min — ABNORMAL LOW (ref >90)
GFR, EST AFRICAN AMERICAN: 89 mL/min — AB (ref >90)
Glucose, Bld: 105 mg/dL — ABNORMAL HIGH (ref 70–99)
POTASSIUM: 4.1 meq/L (ref 3.7–5.3)
Sodium: 141 mEq/L (ref 137–147)

## 2014-05-07 LAB — CBC
HEMATOCRIT: 37.5 % — AB (ref 39.0–52.0)
Hemoglobin: 12.1 g/dL — ABNORMAL LOW (ref 13.0–17.0)
MCH: 26 pg (ref 26.0–34.0)
MCHC: 32.3 g/dL (ref 30.0–36.0)
MCV: 80.6 fL (ref 78.0–100.0)
Platelets: 148 10*3/uL — ABNORMAL LOW (ref 150–400)
RBC: 4.65 MIL/uL (ref 4.22–5.81)
RDW: 18.4 % — ABNORMAL HIGH (ref 11.5–15.5)
WBC: 3.5 10*3/uL — ABNORMAL LOW (ref 4.0–10.5)

## 2014-05-07 SURGERY — CYSTOSCOPY, WITH URETHRAL DILATION
Anesthesia: General

## 2014-05-07 MED ORDER — PROPOFOL 10 MG/ML IV BOLUS
INTRAVENOUS | Status: AC
  Filled 2014-05-07: qty 20

## 2014-05-07 MED ORDER — FENTANYL CITRATE 0.05 MG/ML IJ SOLN
INTRAMUSCULAR | Status: DC
Start: 2014-05-07 — End: 2014-05-07
  Filled 2014-05-07: qty 2

## 2014-05-07 MED ORDER — LIDOCAINE HCL (CARDIAC) 20 MG/ML IV SOLN
INTRAVENOUS | Status: AC
  Filled 2014-05-07: qty 5

## 2014-05-07 MED ORDER — FENTANYL CITRATE 0.05 MG/ML IJ SOLN
25.0000 ug | INTRAMUSCULAR | Status: DC | PRN
  Administered 2014-05-07: 25 ug via INTRAVENOUS

## 2014-05-07 MED ORDER — LIDOCAINE HCL 2 % EX GEL
CUTANEOUS | Status: AC
  Filled 2014-05-07: qty 10

## 2014-05-07 MED ORDER — CIPROFLOXACIN IN D5W 400 MG/200ML IV SOLN
400.0000 mg | INTRAVENOUS | Status: AC
  Administered 2014-05-07: 400 mg via INTRAVENOUS

## 2014-05-07 MED ORDER — IOHEXOL 300 MG/ML  SOLN
INTRAMUSCULAR | Status: DC | PRN
  Administered 2014-05-07: 10 mL

## 2014-05-07 MED ORDER — STERILE WATER FOR IRRIGATION IR SOLN
Status: DC | PRN
  Administered 2014-05-07: 3000 mL

## 2014-05-07 MED ORDER — KETOROLAC TROMETHAMINE 30 MG/ML IJ SOLN
15.0000 mg | Freq: Once | INTRAMUSCULAR | Status: AC | PRN
  Administered 2014-05-07: 30 mg via INTRAVENOUS

## 2014-05-07 MED ORDER — LACTATED RINGERS IV SOLN
INTRAVENOUS | Status: DC
  Administered 2014-05-07: 10:00:00 via INTRAVENOUS
  Administered 2014-05-07: 1000 mL via INTRAVENOUS

## 2014-05-07 MED ORDER — PROPOFOL 10 MG/ML IV BOLUS
INTRAVENOUS | Status: DC | PRN
  Administered 2014-05-07: 180 mg via INTRAVENOUS

## 2014-05-07 MED ORDER — FENTANYL CITRATE 0.05 MG/ML IJ SOLN
INTRAMUSCULAR | Status: DC | PRN
  Administered 2014-05-07 (×2): 25 ug via INTRAVENOUS

## 2014-05-07 MED ORDER — PROMETHAZINE HCL 25 MG/ML IJ SOLN: 6.2500 mg | INTRAMUSCULAR | Status: DC | PRN

## 2014-05-07 MED ORDER — FENTANYL CITRATE 0.05 MG/ML IJ SOLN
INTRAMUSCULAR | Status: AC
  Filled 2014-05-07: qty 2

## 2014-05-07 MED ORDER — ONDANSETRON HCL 4 MG/2ML IJ SOLN
INTRAMUSCULAR | Status: DC | PRN
  Administered 2014-05-07: 4 mg via INTRAVENOUS

## 2014-05-07 MED ORDER — CIPROFLOXACIN IN D5W 400 MG/200ML IV SOLN
INTRAVENOUS | Status: AC
  Filled 2014-05-07: qty 200

## 2014-05-07 MED ORDER — LIDOCAINE HCL (CARDIAC) 20 MG/ML IV SOLN
INTRAVENOUS | Status: DC | PRN
  Administered 2014-05-07: 50 mg via INTRAVENOUS

## 2014-05-07 SURGICAL SUPPLY — 20 items
BAG URINE DRAINAGE (UROLOGICAL SUPPLIES) ×3 IMPLANT
BAG URO CATCHER STRL LF (DRAPE) ×3 IMPLANT
BALLN NEPHROSTOMY (BALLOONS) ×3
BALLOON NEPHROSTOMY (BALLOONS) IMPLANT
BASKET ZERO TIP NITINOL 2.4FR (BASKET) IMPLANT
BSKT STON RTRVL ZERO TP 2.4FR (BASKET)
CATH FOLEY 2W COUNCIL 20FR 5CC (CATHETERS) ×3 IMPLANT
CATH INTERMIT  6FR 70CM (CATHETERS) IMPLANT
CLOTH BEACON ORANGE TIMEOUT ST (SAFETY) ×3 IMPLANT
DRAPE CAMERA CLOSED 9X96 (DRAPES) ×3 IMPLANT
GLOVE BIOGEL M STRL SZ7.5 (GLOVE) ×3 IMPLANT
GOWN STRL REUS W/TWL LRG LVL3 (GOWN DISPOSABLE) ×6 IMPLANT
GUIDEWIRE ANG ZIPWIRE 038X150 (WIRE) IMPLANT
GUIDEWIRE STR DUAL SENSOR (WIRE) ×3 IMPLANT
IV NS 1000ML (IV SOLUTION) ×3
IV NS 1000ML BAXH (IV SOLUTION) ×1 IMPLANT
MANIFOLD NEPTUNE II (INSTRUMENTS) ×3 IMPLANT
PACK CYSTO (CUSTOM PROCEDURE TRAY) ×3 IMPLANT
TUBING CONNECTING 10 (TUBING) ×2 IMPLANT
TUBING CONNECTING 10' (TUBING) ×1

## 2014-05-07 NOTE — Transfer of Care (Signed)
Immediate Anesthesia Transfer of Care Note  Patient: Jordan Terry  Procedure(s) Performed: Procedure(s) with comments: CYSTOSCOPY WITH BALLOON DILATION OF URETHRAL STRICTURE (N/A) - BALLOON DILATION OF URETHRAL STRICTURE      Patient Location: PACU  Anesthesia Type:General  Level of Consciousness: awake and alert   Airway & Oxygen Therapy: Patient Spontanous Breathing and Patient connected to face mask oxygen  Post-op Assessment: Report given to PACU RN and Post -op Vital signs reviewed and stable  Post vital signs: Reviewed and stable  Complications: No apparent anesthesia complications

## 2014-05-07 NOTE — Interval H&P Note (Signed)
History and Physical Interval Note:  05/07/2014 9:58 AM  Jordan Terry  has presented today for surgery, with the diagnosis of Urethral Stricture  The various methods of treatment have been discussed with the patient and family. After consideration of risks, benefits and other options for treatment, the patient has consented to  Procedure(s) with comments: [name]     as a surgical intervention .  The patient's history has been reviewed, patient examined, no change in status, stable for surgery.  I have reviewed the patient's chart and labs.  Questions were answered to the patient's satisfaction.     Dinh Ayotte,LES

## 2014-05-07 NOTE — Anesthesia Postprocedure Evaluation (Signed)
  Anesthesia Post-op Note  Patient: Jordan Terry  Procedure(s) Performed: Procedure(s) (LRB): CYSTOSCOPY WITH BALLOON DILATION OF URETHRAL STRICTURE (N/A)  Patient Location: PACU  Anesthesia Type: General  Level of Consciousness: awake and alert   Airway and Oxygen Therapy: Patient Spontanous Breathing  Post-op Pain: mild  Post-op Assessment: Post-op Vital signs reviewed, Patient's Cardiovascular Status Stable, Respiratory Function Stable, Patent Airway and No signs of Nausea or vomiting  Last Vitals:  Filed Vitals:   05/07/14 1139  BP: 133/82  Pulse: 62  Temp: 36.1 C  Resp: 14    Post-op Vital Signs: stable   Complications: No apparent anesthesia complications

## 2014-05-07 NOTE — Anesthesia Preprocedure Evaluation (Signed)
Anesthesia Evaluation  Patient identified by MRN, date of birth, ID band Patient awake    Reviewed: Allergy & Precautions, H&P , NPO status , Patient's Chart, lab work & pertinent test results  Airway Mallampati: II  TM Distance: >3 FB Neck ROM: Full    Dental no notable dental hx.    Pulmonary neg pulmonary ROS,  breath sounds clear to auscultation  Pulmonary exam normal       Cardiovascular negative cardio ROS  Rhythm:Regular Rate:Normal     Neuro/Psych negative neurological ROS  negative psych ROS   GI/Hepatic negative GI ROS, Neg liver ROS,   Endo/Other  negative endocrine ROS  Renal/GU negative Renal ROS  negative genitourinary   Musculoskeletal negative musculoskeletal ROS (+)   Abdominal   Peds negative pediatric ROS (+)  Hematology negative hematology ROS (+)   Anesthesia Other Findings   Reproductive/Obstetrics negative OB ROS                             Anesthesia Physical Anesthesia Plan  ASA: II  Anesthesia Plan: General   Post-op Pain Management:    Induction: Intravenous  Airway Management Planned: LMA  Additional Equipment:   Intra-op Plan:   Post-operative Plan: Extubation in OR  Informed Consent: I have reviewed the patients History and Physical, chart, labs and discussed the procedure including the risks, benefits and alternatives for the proposed anesthesia with the patient or authorized representative who has indicated his/her understanding and acceptance.   Dental advisory given  Plan Discussed with: CRNA and Surgeon  Anesthesia Plan Comments:         Anesthesia Quick Evaluation  

## 2014-05-07 NOTE — Discharge Instructions (Addendum)
1. You may see some blood in the urine and may have some burning with urination for 48-72 hours. You also may notice that you have to urinate more frequently or urgently after your procedure which is normal.  2. You should call should you develop an inability urinate, fever > 101, persistent nausea and vomiting that prevents you from eating or drinking to stay hydrated.        General Anesthesia, Care After Refer to this sheet in the next few weeks. These instructions provide you with information on caring for yourself after your procedure. Your health care provider may also give you more specific instructions. Your treatment has been planned according to current medical practices, but problems sometimes occur. Call your health care provider if you have any problems or questions after your procedure. WHAT TO EXPECT AFTER THE PROCEDURE After the procedure, it is typical to experience:  Sleepiness.  Nausea and vomiting. HOME CARE INSTRUCTIONS  For the first 24 hours after general anesthesia:  Have a responsible person with you.  Do not drive a car. If you are alone, do not take public transportation.  Do not drink alcohol.  Do not take medicine that has not been prescribed by your health care provider.  Do not sign important papers or make important decisions.  You may resume a normal diet and activities as directed by your health care provider.  Change bandages (dressings) as directed.  If you have questions or problems that seem related to general anesthesia, call the hospital and ask for the anesthetist or anesthesiologist on call. SEEK MEDICAL CARE IF:  You have nausea and vomiting that continue the day after anesthesia.  You develop a rash. SEEK IMMEDIATE MEDICAL CARE IF:   You have difficulty breathing.  You have chest pain.  You have any allergic problems. Document Released: 01/18/2001 Document Revised: 10/17/2013 Document Reviewed: 04/27/2013 [facility] Patient  Information 2015 [place], [place]. This information is not intended to replace advice given to you by your health care provider. Make sure you discuss any questions you have with your health care provider. Foley Catheter Care, Adult A Foley catheter is a soft, flexible tube. This tube is placed into your bladder to drain pee (urine). If you go home with this catheter in place, follow the instructions below. TAKING CARE OF THE CATHETER 1. Wash your hands with soap and water. 2. Put soap and water on a clean washcloth.  Clean the skin where the tube goes into your body.  Clean away from the tube site.  Never wipe toward the tube.  Clean the area using a circular motion.  Remove all the soap. Pat the area dry with a clean towel. For males, reposition the skin that covers the end of the penis (foreskin). 3. Attach the tube to your leg with tape or a leg strap. Do not stretch the tube tight. If you are using tape, remove any stickiness left behind by past tape you used. 4. Keep the drainage bag below your hips. Keep it off the floor. 5. Check your tube during the day. Make sure it is working and draining. Make sure the tube does not curl, twist, or bend. 6. Do not pull on the tube or try to take it out. TAKING CARE OF THE DRAINAGE BAGS You will have a large overnight drainage bag and a small leg bag. You may wear the overnight bag any time. Never wear the small bag at night. Follow the directions below. Emptying the Drainage Bag  Empty your drainage bag when it is  - full or at least 2-3 times a day. 1. Wash your hands with soap and water. 2. Keep the drainage bag below your hips. 3. Hold the dirty bag over the toilet or clean container. 4. Open the pour spout at the bottom of the bag. Empty the pee into the toilet or container. Do not let the pour spout touch anything. 5. Clean the pour spout with a gauze pad or cotton ball that has rubbing alcohol on it. 6. Close the pour spout. 7. Attach  the bag to your leg with tape or a leg strap. 8. Wash your hands well. Changing the Drainage Bag Change your bag once a month or sooner if it starts to smell or look dirty.  1. Wash your hands with soap and water. 2. Pinch the rubber tube so that pee does not spill out. 3. Disconnect the catheter tube from the drainage tube at the connection valve. Do not let the tubes touch anything. 4. Clean the end of the catheter tube with an alcohol wipe. Clean the end of a the drainage tube with a different alcohol wipe. 5. Connect the catheter tube to the drainage tube of the clean drainage bag. 6. Attach the new bag to the leg with tape or a leg strap. Avoid attaching the new bag too tightly. 7. Wash your hands well. Cleaning the Drainage Bag 1. Wash your hands with soap and water. 2. Wash the bag in warm, soapy water. 3. Rinse the bag with warm water. 4. Fill the bag with a mixture of white vinegar and water (1 c vinegar to 1 qt warm water [.2 L vinegar to 1 L warm water]). Close the bag and soak it for 30 minutes in a solution. 5. Rinse the bag with warm water. 6. Hang the bag to dry with the pour spout open and hanging downward. 7. Store the clean bag (once it is dry) in a clean plastic bag. 8. Wash your hands well. PREVENT INFECTION  Wash your hands before and after touching your tube.  Take showers every day. Wash the skin where the tube enters your body. Do not take baths. Replace wet leg straps with dry ones, if this applies.  Do not use powders, sprays, or lotions on the genital area. Only use creams, lotions, or ointments as told by your doctor.  For females, wipe from front to back after going to the bathroom.  Drink enough fluids to keep your pee clear or pale yellow unless you are told not to have too much fluid (fluid restriction).  Do not let the drainage bag or tubing touch or lie on the floor.  Wear cotton underwear to keep the area dry. GET HELP RIGHT AWAY IF:  You have  pain, puffiness (swelling), redness, or yellowish-white fluid (pus) where the tube enters the body.  You have pain in the belly (abdomen), legs, lower back, or bladder.  You have a fever.  You see blood fill the tube, or your pee is pink or red.  You feel sick to your stomach (nauseous), throw up (vomit), or have chills.  Your tube gets pulled out.  Your pee is cloudy or smells unusually bad.  Your tube becomes clogged.  You are not draining pee into the bag or your bladder feels full.  Your tube starts to leak. MAKE SURE YOU:   Understand these instructions.  Will watch your condition.  Will get help right away if you  are not doing well or get worse. Document Released: 02/06/2013 Document Reviewed: 02/06/2013 [facility] Patient Information 2015 [place]. This information is not intended to replace advice given to you by your health care provider. Make sure you discuss any questions you have with your health care provider.

## 2014-05-07 NOTE — Op Note (Signed)
Preoperative diagnosis:  1. Urethral stricture  Postoperative diagnosis: 1. Urethral stricture  Procedure(s): 1. Cystoscopy 2. Balloon dilation of urethral stricture  Surgeon: Dr. Roxy Horseman, Jr  Anesthesia: General  Complications: None  EBL: None  Indication: Mr. Coots is a 80 year old gentleman with a history of urethral stricture disease who had been performing weekly catheterization until recently when he was unable to catheterize.  He was still voiding ok but attempts to catheterize him in the office were also unsuccessful.  We discussed options and agreed to proceed with cystoscopy under anesthesia with plans to perform balloon dilation if needed. I discussed the potential benefits and risks of the procedure, side effects of the proposed treatment, the likelihood of the patient achieving the goals of the procedure, and any potential problems that might occur during the procedure or recuperation.   Description of procedure:  The patient was taken to the operating room and general anesthesia was induced. He was given preoperative antibiotics, prepped and draped in the usual sterile fashion, and placed in dorsal lithotomy. Preoperative timeout was performed.  Cystoscopy was performed which revealed a dense urethral stricture in the bulbar urethral consistent with his prior history of a short < 1 cm bulbar stricture. I placed a 0.38 Sensor wire through the stricture and into the bladder without difficulty under fluoroscopic guidance.  The Cook 24 Fr Ultraxx balloon dilator was then inserted over the wire and it was inflated with contrast to a pressure of 16 mmHg. It was left inflated for 5 minutes and then deflated. Cystoscopy was then again performed and the 22 Fr cystoscope was able to be advanced into the bladder without difficulty.  The bladder was examined and was without tumors, stones, or mucosal pathology aside from mild trabeculation.  The ureteral orifices were in their  expected anatomic locations and were effluxing clear urine.  The wire was left in place and the cystoscope was removed.  A 20 Fr council catheter was then placed over the wire into the bladder and the wire was removed.  The patient tolerated the procedure well and without complications.  He was able to be awakened and transferred to the PACU in stable condition.

## 2014-05-08 ENCOUNTER — Encounter (HOSPITAL_COMMUNITY): Payer: Self-pay | Admitting: Urology

## 2014-05-30 ENCOUNTER — Ambulatory Visit: Payer: Medicare Other | Admitting: Neurology

## 2014-07-12 ENCOUNTER — Ambulatory Visit (INDEPENDENT_AMBULATORY_CARE_PROVIDER_SITE_OTHER): Payer: Medicare Other | Admitting: Neurology

## 2014-07-12 ENCOUNTER — Encounter: Payer: Self-pay | Admitting: Neurology

## 2014-07-12 VITALS — BP 130/77 | HR 89 | Ht 74.0 in | Wt 173.0 lb

## 2014-07-12 DIAGNOSIS — R269 Unspecified abnormalities of gait and mobility: Secondary | ICD-10-CM

## 2014-07-12 DIAGNOSIS — R42 Dizziness and giddiness: Secondary | ICD-10-CM

## 2014-07-12 NOTE — Progress Notes (Signed)
Reason for visit: Dizziness  Jordan Terry is an 80 y.o. male  History of present illness:  Jordan Terry is a 80 year old right-handed white male with a history of an anxiety disorder, and some chronic dizziness issue. The patient feels better in the morning, but in the afternoon he begins to feel dizzy, and he feels poorly. The patient has noticed that if he takes Ultram, or if he takes Percocet, he will feel more relaxed, and he feels better without dizziness. The patient is on gabapentin, and he currently is on 200 mg 3 times daily. He has recently reduced the dose, and he feels worse. The patient takes Ativan at night, and he is on 30 mg of nortriptyline at night. The patient is followed by Dr. Casimiro Needle. The patient indicates that the nortriptyline does not help his dizziness, and it results in sexual side effects. When he reduced the dose, he has become more irritable, however. The patient takes Restoril and Ativan in the evening hours. The patient indicates that his mother also had an anxiety disorder. He returns to this office for an evaluation.   Past Medical History  Diagnosis Date  . Grover's disease   . Anxiety   . Vertigo   . Cancer     prostate  . Anginal pain 20 years ago  . Headache(784.0)     not since retired  . Arthritis     Past Surgical History  Procedure Laterality Date  . Shoulder surgery  2012    right  . Knee surgery  1990    "scope"  . Cardiac catheterization  "20 years ago"  . Radioactive seed implant  2002    "for prostate cancer"  . Carpal tunnel release Left 2008  . Nose surgery  1991, 1998  . Thyroidectomy, partial  1974  . Vasectomy  1970  . Cataract extraction Bilateral 2014  . Cystoscopy with urethral dilatation N/A 08/14/2013    Procedure: CYSTOSCOPY WITH URETHRAL DILATATION BALLOON DILATION OF URETHRAL STRICTURE;  Surgeon: Dutch Gray, MD;  Location: [facility];  Service: Urology;  Laterality: N/A;  BALLOON DILATION   . Cystoscopy with urethral  dilatation N/A 05/07/2014    Procedure: CYSTOSCOPY WITH BALLOON DILATION OF URETHRAL STRICTURE;  Surgeon: Raynelle Bring, MD;  Location: [facility];  Service: Urology;  Laterality: N/A;  BALLOON DILATION OF URETHRAL STRICTURE        Family History  Problem Relation Age of Onset  . Anxiety disorder Mother   . Cancer Father     colon, lung  . Diabetes Brother   . Cancer Brother   . Diabetes Sister   . Leukemia Sister   . Cancer Sister     breast  . Cancer Sister     breast  . Cancer Sister     thyroid  . Diabetes Sister     Social history:  reports that he has never smoked. He has never used smokeless tobacco. He reports that he does not drink alcohol or use illicit drugs.    Allergies  Allergen Reactions  . Benadryl [Diphenhydramine Hcl]     MAKES HIM GO CRAZY  . Hydrocodone Itching    Small amounts are okay    Medications:  Current Outpatient Prescriptions on File Prior to Visit  Medication Sig Dispense Refill  . folic acid (FOLVITE) 1 MG tablet Take 1 mg by mouth daily. Six days a week. Pt takes every day but on sat.      . gabapentin (NEURONTIN)  100 MG capsule Take 200 mg by mouth 3 (three) times daily.       Marland Kitchen LORazepam (ATIVAN) 0.5 MG tablet Take 1 mg by mouth at bedtime.      . methotrexate 2.5 MG tablet Take 12.5 mg by mouth every Saturday.       . nortriptyline (PAMELOR) 10 MG capsule Take 3 capsules (30 mg total) by mouth at bedtime.  270 capsule  0  . oxyCODONE-acetaminophen (PERCOCET) 7.5-325 MG per tablet Take 0.5-1 tablets by mouth every 8 (eight) hours as needed for pain.      Marland Kitchen temazepam (RESTORIL) 15 MG capsule Take 15 mg by mouth at bedtime.      . triamcinolone cream (KENALOG) 0.1 % Apply 1 application topically every morning. APPLY TO CHEST AND BACK EVERY MORNING FOR GROVER DISEASE       No current facility-administered medications on file prior to visit.    ROS:  Out of a complete 14 system review of symptoms, the patient complains only of the  following symptoms, and all other reviewed systems are negative.  Chest pain Insomnia, frequent waking Difficulty urinating, frequency of urination Joint pain Skin rash, itching Dizziness, weakness Behavior problems, anxiety, hyperactivity  Blood pressure 130/77, pulse 89, height 6\' 2"  (1.88 m), weight 173 lb (78.472 kg).  Physical Exam  General: The patient is alert and cooperative at the time of the examination. The patient demonstrates a flat affect.  Skin: No significant peripheral edema is noted.   Neurologic Exam  Mental status: The patient is oriented x 3.  Cranial nerves: Facial symmetry is present. Speech is normal, no aphasia or dysarthria is noted. Extraocular movements are full. Visual fields are full.  Motor: The patient has good strength in all 4 extremities.  Sensory examination: Sulfa sensation is symmetric on the face, arms, and legs.  Coordination: The patient has good finger-nose-finger and heel-to-shin bilaterally.  Gait and station: The patient has a normal gait. Tandem gait is normal. Romberg is negative. No drift is seen.  Reflexes: Deep tendon reflexes are symmetric.   Assessment/Plan:  1. Anxiety disorder  2. Chronic dizziness  The dizziness likely is a manifestation of his anxiety, and some muscle tension component. The patient indicates that the nortriptyline has not helped him, and it does have side effects. In the past, he did not do well with Wellbutrin, Prozac, and Paxil. The patient will taper off of the nortriptyline over the next 4 weeks. If he becomes more irritable, we will consider addition of a new medication such as Pristiq. The patient wants to go up on the Ativan, but this medication is prescribed through Dr. Casimiro Needle. The patient otherwise will followup in about 6 months.  Jill Alexanders MD 07/12/2014 8:26 AM  [facility] [address] [place], [place] [address]  Phone [phone] Fax  [phone]

## 2014-07-12 NOTE — Patient Instructions (Signed)
Taper off of the nortryptiline, go to 20 mg at night for 2 weeks, then go to 10 mg at night for 2 weeks, then stop   Dizziness Dizziness is a common problem. It is a feeling of unsteadiness or light-headedness. You may feel like you are about to faint. Dizziness can lead to injury if you stumble or fall. A person of any age group can suffer from dizziness, but dizziness is more common in older adults. CAUSES  Dizziness can be caused by many different things, including:  Middle ear problems.  Standing for too long.  Infections.  An allergic reaction.  Aging.  An emotional response to something, such as the sight of blood.  Side effects of medicines.  Tiredness.  Problems with circulation or blood pressure.  Excessive use of alcohol or medicines, or illegal drug use.  Breathing too fast (hyperventilation).  An irregular heart rhythm (arrhythmia).  A low red blood cell count (anemia).  Pregnancy.  Vomiting, diarrhea, fever, or other illnesses that cause body fluid loss (dehydration).  Diseases or conditions such as Parkinson's disease, high blood pressure (hypertension), diabetes, and thyroid problems.  Exposure to extreme heat. DIAGNOSIS  Your health care provider will ask about your symptoms, perform a physical exam, and perform an electrocardiogram (ECG) to record the electrical activity of your heart. Your health care provider may also perform other heart or blood tests to determine the cause of your dizziness. These may include:  Transthoracic echocardiogram (TTE). During echocardiography, sound waves are used to evaluate how blood flows through your heart.  Transesophageal echocardiogram (TEE).  Cardiac monitoring. This allows your health care provider to monitor your heart rate and rhythm in real time.  Holter monitor. This is a portable device that records your heartbeat and can help diagnose heart arrhythmias. It allows your health care provider to track your  heart activity for several days if needed.  Stress tests by exercise or by giving medicine that makes the heart beat faster. TREATMENT  Treatment of dizziness depends on the cause of your symptoms and can vary greatly. HOME CARE INSTRUCTIONS   Drink enough fluids to keep your urine clear or pale yellow. This is especially important in very hot weather. In older adults, it is also important in cold weather.  Take your medicine exactly as directed if your dizziness is caused by medicines. When taking blood pressure medicines, it is especially important to get up slowly.  Rise slowly from chairs and steady yourself until you feel okay.  In the morning, first sit up on the side of the bed. When you feel okay, stand slowly while holding onto something until you know your balance is fine.  Move your legs often if you need to stand in one place for a long time. Tighten and relax your muscles in your legs while standing.  Have someone stay with you for 1-2 days if dizziness continues to be a problem. Do this until you feel you are well enough to stay alone. Have the person call your health care provider if he or she notices changes in you that are concerning.  Do not drive or use heavy machinery if you feel dizzy.  Do not drink alcohol. SEEK IMMEDIATE MEDICAL CARE IF:   Your dizziness or light-headedness gets worse.  You feel nauseous or vomit.  You have problems talking, walking, or using your arms, hands, or legs.  You feel weak.  You are not thinking clearly or you have trouble forming sentences. It may  take a friend or family member to notice this.  You have chest pain, abdominal pain, shortness of breath, or sweating.  Your vision changes.  You notice any bleeding.  You have side effects from medicine that seems to be getting worse rather than better. MAKE SURE YOU:   Understand these instructions.  Will watch your condition.  Will get help right away if you are not doing  well or get worse. Document Released: 04/07/2001 Document Revised: 10/17/2013 Document Reviewed: 05/01/2011 [facility] Patient Information 2015 [place], [place]. This information is not intended to replace advice given to you by your health care provider. Make sure you discuss any questions you have with your health care provider.

## 2014-08-09 ENCOUNTER — Telehealth: Payer: Self-pay | Admitting: Neurology

## 2014-08-09 NOTE — Telephone Encounter (Signed)
Patient stated as discussed previously with Dr. Jannifer Franklin, his last day of weaning off nortriptyline (PAMELOR) 10 MG capsule would be tomorrow evening.  He feels it would be beneficial if he continue taking three tabs at bedtime.  Please call and advise.

## 2014-08-09 NOTE — Telephone Encounter (Signed)
I called patient. He initially believe that the nortriptyline was not working and it was causing too many side effects, as he is come down off of nortriptyline, he clearly believes that he felt better on the medication. We will go back to 30 mg at night.

## 2014-08-20 ENCOUNTER — Telehealth: Payer: Self-pay | Admitting: Neurology

## 2014-08-20 MED ORDER — NORTRIPTYLINE HCL 10 MG PO CAPS: 30.0000 mg | ORAL_CAPSULE | Freq: Every day | ORAL | Status: DC

## 2014-08-20 NOTE — Telephone Encounter (Signed)
Patient requesting refill of nortriptyline sent to his [facility], 3 month supply, if questions, please call.

## 2014-08-20 NOTE — Telephone Encounter (Signed)
R has been sent

## 2014-09-12 ENCOUNTER — Encounter: Payer: Self-pay | Admitting: Neurology

## 2014-09-18 ENCOUNTER — Encounter: Payer: Self-pay | Admitting: Neurology

## 2015-01-10 ENCOUNTER — Encounter: Payer: Self-pay | Admitting: Nurse Practitioner

## 2015-01-10 ENCOUNTER — Ambulatory Visit (INDEPENDENT_AMBULATORY_CARE_PROVIDER_SITE_OTHER): Payer: Medicare Other | Admitting: Nurse Practitioner

## 2015-01-10 VITALS — BP 145/87 | HR 87 | Ht 74.0 in | Wt 179.6 lb

## 2015-01-10 DIAGNOSIS — R42 Dizziness and giddiness: Secondary | ICD-10-CM | POA: Diagnosis not present

## 2015-01-10 DIAGNOSIS — R269 Unspecified abnormalities of gait and mobility: Secondary | ICD-10-CM

## 2015-01-10 MED ORDER — NORTRIPTYLINE HCL 10 MG PO CAPS: ORAL_CAPSULE | ORAL | Status: DC

## 2015-01-10 NOTE — Patient Instructions (Signed)
Try Nortriptyline 10mg  mid afternoon and 3 at bedtime F/U in 6 months

## 2015-01-10 NOTE — Progress Notes (Signed)
I have read the note, and I agree with the clinical assessment and plan.  Gotham Raden KEITH   

## 2015-01-10 NOTE — Progress Notes (Signed)
[facility]  PATIENT: Jordan Terry DOB: Mar 25, 1932   REASON FOR VISIT:follow up for dizziness, anxiety disorder , gait disorder  HISTORY FROM: patient and wife    HISTORY OF PRESENT ILLNESS:[name] is a 81 year old  right-handed white male with a history of an anxiety disorder, and some chronic dizziness issue.  He was last seen in this office by Dr. Jannifer Franklin 07/12/2014. The patient feels better in the morning, but in the afternoon he begins to feel dizzy, and he feels poorly. The patient has noticed that if he takes Ultram, or if he takes Percocet, he will feel more relaxed,  but he has stopped taking both of these medications. In addition he was going to taper his nortriptyline at his last visit however once he began to taper he felt that he got some benefit from the medication and his dizziness was better. He continues to have episodes midafternoon where he is more anxious . The patient is on gabapentin, and he currently is on 200 mg 3 times daily. He has recently reduced the dose, and he feels worse. The patient takes Ativan at night, and he is on 30 mg of nortriptyline at night. The patient is followed by Dr. Casimiro Needle.  The patient takes Restoril and Ativan in the evening hours. He has failed Wellbutrin, Prozac and Paxil in the past. He denies any recent falls. The patient indicates that his mother also had an anxiety disorder. He returns to this office for an evaluation.     REVIEW OF SYSTEMS: Full 14 system review of systems performed and notable only for those listed, all others are neg:  Constitutional: Fatigue Cardiovascular: neg Ear/Nose/Throat: Hearing aids  Skin: neg Eyes: neg Respiratory: neg Gastroitestinal: Difficulty urinating, self caths Hematology/Lymphatic: neg  Endocrine: neg Musculoskeletal:neg Allergy/Immunology: neg Neurological: Dizziness Psychiatric: Agitation and anxiety Sleep : Insomnia   ALLERGIES: Allergies  Allergen Reactions    . Benadryl [Diphenhydramine Hcl]     MAKES HIM GO CRAZY  . Hydrocodone Itching    Small amounts are okay    HOME MEDICATIONS: Outpatient Prescriptions Prior to Visit  Medication Sig Dispense Refill  . folic acid (FOLVITE) 1 MG tablet Take 1 mg by mouth daily. Six days a week. Pt takes every day but on sat.    . gabapentin (NEURONTIN) 100 MG capsule Take 200 mg by mouth 3 (three) times daily.     Marland Kitchen LORazepam (ATIVAN) 0.5 MG tablet Take 1 mg by mouth at bedtime.    . methotrexate 2.5 MG tablet Take 12.5 mg by mouth every Saturday.     . nortriptyline (PAMELOR) 10 MG capsule Take 3 capsules (30 mg total) by mouth at bedtime. 270 capsule 1  . temazepam (RESTORIL) 15 MG capsule Take 15 mg by mouth at bedtime.    . triamcinolone cream (KENALOG) 0.1 % Apply 1 application topically every morning. APPLY TO CHEST AND BACK EVERY MORNING FOR GROVER DISEASE    . oxyCODONE-acetaminophen (PERCOCET) 7.5-325 MG per tablet Take 0.5-1 tablets by mouth every 8 (eight) hours as needed for pain.     No facility-administered medications prior to visit.    PAST MEDICAL HISTORY: Past Medical History  Diagnosis Date  . Grover's disease   . Anxiety   . Vertigo   . Cancer     prostate  . Anginal pain 20 years ago  . Headache(784.0)     not since retired  . Arthritis     PAST SURGICAL HISTORY: Past Surgical  History  Procedure Laterality Date  . Shoulder surgery  2012    right  . Knee surgery  1990    "scope"  . Cardiac catheterization  "20 years ago"  . Radioactive seed implant  2002    "for prostate cancer"  . Carpal tunnel release Left 2008  . Nose surgery  1991, 1998  . Thyroidectomy, partial  1974  . Vasectomy  1970  . Cataract extraction Bilateral 2014  . Cystoscopy with urethral dilatation N/A 08/14/2013    Procedure: CYSTOSCOPY WITH URETHRAL DILATATION BALLOON DILATION OF URETHRAL STRICTURE;  Surgeon: Dutch Gray, MD;  Location: [facility];  Service: Urology;  Laterality: N/A;  BALLOON  DILATION   . Cystoscopy with urethral dilatation N/A 05/07/2014    Procedure: CYSTOSCOPY WITH BALLOON DILATION OF URETHRAL STRICTURE;  Surgeon: Raynelle Bring, MD;  Location: [facility];  Service: Urology;  Laterality: N/A;  BALLOON DILATION OF URETHRAL STRICTURE        FAMILY HISTORY: Family History  Problem Relation Age of Onset  . Anxiety disorder Mother   . Cancer Father     colon, lung  . Diabetes Brother   . Cancer Brother   . Diabetes Sister   . Leukemia Sister   . Cancer Sister     breast  . Cancer Sister     breast  . Cancer Sister     thyroid  . Diabetes Sister     SOCIAL HISTORY: History   Social History  . Marital Status: Married    Spouse Name: Jordan Terry  . Number of Children: 3  . Years of Education: college   Occupational History  . Not on file.   Social History Main Topics  . Smoking status: Never Smoker   . Smokeless tobacco: Never Used  . Alcohol Use: No  . Drug Use: No  . Sexual Activity: Not on file   Other Topics Concern  . Not on file   Social History Narrative   Patient is married Jordan Terry) and lives at home with his wife.   Patient has three children.   Patient is retired.   Patient has a college education in [employer].   Patient is right-handed.   Patient drinks one cup of coffee daily and one 8 oz of tea daily.     PHYSICAL EXAM  Filed Vitals:   01/10/15 0917  BP: 145/87  Pulse: 87  Height: 6\' 2"  (1.88 m)  Weight: 179 lb 9.6 oz (81.466 kg)   Body mass index is 23.05 kg/(m^2).  Generalized: Well developed, in no acute distress  Head: normocephalic and atraumatic,. Oropharynx benign  Neck: Supple, no carotid bruits  Musculoskeletal: No deformity   Neurological examination   Mentation: Alert oriented to time, place, history taking. Attention span and concentration appropriate. Recent and remote memory intact.  Follows all commands speech and language fluent. Affect flat  Cranial nerve II-XII: Pupils were equal round reactive to  light extraocular movements were full, visual field were full on confrontational test. Facial sensation and strength were normal. hearing was intact to finger rubbing bilaterally. Uvula tongue midline. head turning and shoulder shrug were normal and symmetric.Tongue protrusion into cheek strength was normal. Motor: normal bulk and tone, full strength in the BUE, BLE, fine finger movements normal, no pronator drift. No focal weakness Sensory: normal and symmetric to light touch, pinprick, and  Vibration,  Coordination: finger-nose-finger, heel-to-shin bilaterally, no dysmetria Reflexes: Symmetric upper and lower,  plantar responses were flexor bilaterally. Gait and Station: Rising up from  seated position without assistance, normal stance,  moderate stride, good arm swing, smooth turning, able to perform tiptoe, and heel walking without difficulty. Tandem gait is mildly  Unsteady.  DIAGNOSTIC DATA (LABS, IMAGING, TESTING) - I reviewed patient records, labs, notes, testing and imaging myself where available.  Lab Results  Component Value Date   WBC 3.5* 05/07/2014   HGB 12.1* 05/07/2014   HCT 37.5* 05/07/2014   MCV 80.6 05/07/2014   PLT 148* 05/07/2014      Component Value Date/Time   NA 141 05/07/2014 0807   K 4.1 05/07/2014 0807   CL 104 05/07/2014 0807   CO2 27 05/07/2014 0807   GLUCOSE 105* 05/07/2014 0807   BUN 12 05/07/2014 0807   CREATININE 0.95 05/07/2014 0807   CALCIUM 9.2 05/07/2014 0807   PROT 6.6 07/05/2011 0805   ALBUMIN 3.7 07/05/2011 0805   AST 10 07/05/2011 0805   ALT 7 07/05/2011 0805   ALKPHOS 54 07/05/2011 0805   BILITOT 0.5 07/05/2011 0805   GFRNONAA 77* 05/07/2014 0807   GFRAA 10* 05/07/2014 0807    ASSESSMENT AND PLAN  81 y.o. year old male  has a past medical history of Grover's disease; Anxiety; Vertigo; and gait disorder  here to follow-up. When he tapered the nortriptyline he felt it did help his dizziness. He typically has an episode midafternoon. He is  usually good in the morning. He restarted the medication.   Try Nortriptyline 10mg  mid afternoon and 3 at bedtime Be careful with your ambulation, given examples of exercises for balance F/U in 6 months Dennie Bible, [facility], [facility], APRN  [facility] [address] [place], [place] [address] [phone]

## 2015-01-22 ENCOUNTER — Telehealth: Payer: Self-pay | Admitting: Nurse Practitioner

## 2015-01-22 NOTE — Telephone Encounter (Signed)
TC to wife. OK to take this this in the afternoon it is low dose. She is appreciative of the call.

## 2015-01-22 NOTE — Telephone Encounter (Signed)
Pt's wife called to state that the pt wants to stop taking the nortriptyline (PAMELOR) 10 MG capsule during the day because he thinks it's not working.  The wife said he really needs it and he has not given it enough time to take it to get in his system good.  She just wants confirmation to let pt know it is ok for him to take as directed.  Please call and advise.

## 2015-01-22 NOTE — Telephone Encounter (Signed)
Returned call. No answer.  

## 2015-01-31 ENCOUNTER — Other Ambulatory Visit: Payer: Self-pay | Admitting: Dermatology

## 2015-03-29 ENCOUNTER — Encounter (HOSPITAL_COMMUNITY): Payer: Self-pay | Admitting: Emergency Medicine

## 2015-03-29 ENCOUNTER — Emergency Department (HOSPITAL_COMMUNITY)
Admission: EM | Admit: 2015-03-29 | Discharge: 2015-03-29 | Disposition: A | Discharge status: Home / Self Care | Payer: Medicare Other | Attending: Emergency Medicine | Admitting: Emergency Medicine

## 2015-03-29 DIAGNOSIS — R319 Hematuria, unspecified: Secondary | ICD-10-CM | POA: Diagnosis not present

## 2015-03-29 DIAGNOSIS — Z872 Personal history of diseases of the skin and subcutaneous tissue: Secondary | ICD-10-CM | POA: Diagnosis not present

## 2015-03-29 DIAGNOSIS — F419 Anxiety disorder, unspecified: Secondary | ICD-10-CM | POA: Insufficient documentation

## 2015-03-29 DIAGNOSIS — Z8546 Personal history of malignant neoplasm of prostate: Secondary | ICD-10-CM | POA: Diagnosis not present

## 2015-03-29 DIAGNOSIS — R339 Retention of urine, unspecified: Secondary | ICD-10-CM | POA: Diagnosis not present

## 2015-03-29 DIAGNOSIS — Z8679 Personal history of other diseases of the circulatory system: Secondary | ICD-10-CM | POA: Insufficient documentation

## 2015-03-29 DIAGNOSIS — Z9889 Other specified postprocedural states: Secondary | ICD-10-CM | POA: Insufficient documentation

## 2015-03-29 DIAGNOSIS — M1990 Unspecified osteoarthritis, unspecified site: Secondary | ICD-10-CM | POA: Insufficient documentation

## 2015-03-29 DIAGNOSIS — Z79899 Other long term (current) drug therapy: Secondary | ICD-10-CM | POA: Diagnosis not present

## 2015-03-29 LAB — URINALYSIS, ROUTINE W REFLEX MICROSCOPIC
BILIRUBIN URINE: NEGATIVE
Glucose, UA: NEGATIVE mg/dL
Hgb urine dipstick: NEGATIVE
KETONES UR: NEGATIVE mg/dL
Leukocytes, UA: NEGATIVE
NITRITE: NEGATIVE
PH: 6.5 (ref 5.0–8.0)
Protein, ur: NEGATIVE mg/dL
SPECIFIC GRAVITY, URINE: 1.006 (ref 1.005–1.030)
UROBILINOGEN UA: 0.2 mg/dL (ref 0.0–1.0)

## 2015-03-29 LAB — BASIC METABOLIC PANEL
ANION GAP: 8 (ref 5–15)
BUN: 13 mg/dL (ref 6–20)
CO2: 28 mmol/L (ref 22–32)
Calcium: 9.3 mg/dL (ref 8.9–10.3)
Chloride: 103 mmol/L (ref 101–111)
Creatinine, Ser: 0.9 mg/dL (ref 0.61–1.24)
GFR calc Af Amer: >60 mL/min (ref >60)
GFR calc non Af Amer: >60 mL/min (ref >60)
Glucose, Bld: 104 mg/dL — ABNORMAL HIGH (ref 65–99)
POTASSIUM: 4 mmol/L (ref 3.5–5.1)
SODIUM: 139 mmol/L (ref 135–145)

## 2015-03-29 NOTE — Discharge Instructions (Signed)
You were seen today after 2 episodes of urinary retention. Given that you are able to self cath and draining her bladder and then spontaneously void, it is reasonable for you to be discharged with instructions to self cath every 4-6 hours if you are unable to spontaneously void. If you are unable to self cath, you may need to return for an indwelling Foley catheter. You should call Dr. Lynne Logan office for very close follow-up. You do not have evidence of a urinary tract infection and your kidneys function is reassuring.  Acute Urinary Retention Acute urinary retention is the temporary inability to urinate. This is a common problem in older men. As men age their prostates become larger and block the flow of urine from the bladder. This is usually a problem that has come on gradually.  HOME CARE INSTRUCTIONS If you are sent home with a Foley catheter and a drainage system, you will need to discuss the best course of action with your health care provider. While the catheter is in, maintain a good intake of fluids. Keep the drainage bag emptied and lower than your catheter. This is so that contaminated urine will not flow back into your bladder, which could lead to a urinary tract infection. There are two main types of drainage bags. One is a large bag that usually is used at night. It has a good capacity that will allow you to sleep through the night without having to empty it. The second type is called a leg bag. It has a smaller capacity, so it needs to be emptied more frequently. However, the main advantage is that it can be attached by a leg strap and can go underneath your clothing, allowing you the freedom to move about or leave your home. Only take over-the-counter or prescription medicines for pain, discomfort, or fever as directed by your health care provider.  SEEK MEDICAL CARE IF:  You develop a low-grade fever.  You experience spasms or leakage of urine with the spasms. SEEK IMMEDIATE MEDICAL  CARE IF:   You develop chills or fever.  Your catheter stops draining urine.  Your catheter falls out.  You start to develop increased bleeding that does not respond to rest and increased fluid intake. MAKE SURE YOU:  Understand these instructions.  Will watch your condition.  Will get help right away if you are not doing well or get worse. Document Released: 01/18/2001 Document Revised: 10/17/2013 Document Reviewed: 03/23/2013 [facility] Patient Information 2015 [place], [place]. This information is not intended to replace advice given to you by your health care provider. Make sure you discuss any questions you have with your health care provider.

## 2015-03-29 NOTE — ED Provider Notes (Signed)
CSN: [id number]     Arrival date & time 03/29/15  0555 History   First MD Initiated Contact with Patient 03/29/15 [phone]     Chief Complaint  Patient presents with  . Urinary Retention     (Consider location/radiation/quality/duration/timing/severity/associated sxs/prior Treatment) HPI  This is an 81 year old male with a history of prostate cancer who presents with urinary retention. Patient reports that he in and out caths once daily at baseline. He usually does this at 10 AM. He states that 2 days ago he was unable to urinate spontaneously overnight and had to in/out cath. During the day yesterday he had no problems voiding but again last night had in and out caths twice because he is unable to urinate. He states that he was able to drain his bladder both times but at sometimes has difficulty getting the catheter past the prostate and he has noted a small amount of bleeding. He called his urology office who directed him to the ER or come into the office later today. When patient arrived here, he was able to spontaneously void. He states that he has noted some blood with the most recent catheterizations and feels like it is not going in all the way. Denies any back pains or fevers.  Past Medical History  Diagnosis Date  . Grover's disease   . Anxiety   . Vertigo   . Cancer     prostate  . Anginal pain 20 years ago  . Headache(784.0)     not since retired  . Arthritis    Past Surgical History  Procedure Laterality Date  . Shoulder surgery  2012    right  . Knee surgery  1990    "scope"  . Cardiac catheterization  "20 years ago"  . Radioactive seed implant  2002    "for prostate cancer"  . Carpal tunnel release Left 2008  . Nose surgery  1991, 1998  . Thyroidectomy, partial  1974  . Vasectomy  1970  . Cataract extraction Bilateral 2014  . Cystoscopy with urethral dilatation N/A 08/14/2013    Procedure: CYSTOSCOPY WITH URETHRAL DILATATION BALLOON DILATION OF URETHRAL STRICTURE;   Surgeon: Dutch Gray, MD;  Location: [facility];  Service: Urology;  Laterality: N/A;  BALLOON DILATION   . Cystoscopy with urethral dilatation N/A 05/07/2014    Procedure: CYSTOSCOPY WITH BALLOON DILATION OF URETHRAL STRICTURE;  Surgeon: Raynelle Bring, MD;  Location: [facility];  Service: Urology;  Laterality: N/A;  BALLOON DILATION OF URETHRAL STRICTURE       Family History  Problem Relation Age of Onset  . Anxiety disorder Mother   . Cancer Father     colon, lung  . Diabetes Brother   . Cancer Brother   . Diabetes Sister   . Leukemia Sister   . Cancer Sister     breast  . Cancer Sister     breast  . Cancer Sister     thyroid  . Diabetes Sister    History  Substance Use Topics  . Smoking status: Never Smoker   . Smokeless tobacco: Never Used  . Alcohol Use: No    Review of Systems  Constitutional: Negative.  Negative for fever.  Respiratory: Negative.   Cardiovascular: Negative.   Genitourinary: Positive for hematuria and difficulty urinating. Negative for dysuria, flank pain and penile pain.  Musculoskeletal: Negative for back pain.  All other systems reviewed and are negative.     Allergies  Benadryl and Hydrocodone  Home Medications   Prior  to Admission medications   Medication Sig Start Date End Date Taking? Authorizing Provider  folic acid (FOLVITE) 1 MG tablet Take 1 mg by mouth daily. Six days a week. Pt takes every day but on sat.   Yes Historical Provider, MD  gabapentin (NEURONTIN) 100 MG capsule Take 200 mg by mouth 3 (three) times daily.    Yes Historical Provider, MD  LORazepam (ATIVAN) 0.5 MG tablet Take 1 mg by mouth at bedtime.   Yes Historical Provider, MD  methotrexate 2.5 MG tablet Take 12.5 mg by mouth every Saturday.    Yes Historical Provider, MD  nortriptyline (PAMELOR) 10 MG capsule Take 1 tab mid afternoon and 3 at hs Patient taking differently: Take 30 mg by mouth 2 (two) times daily. 1 tablet around lunch and 3 tablets at bedtime 01/10/15  Yes  Dennie Bible, NP  oxyCODONE-acetaminophen (PERCOCET) 7.5-325 MG per tablet Take 0.5-1 tablets by mouth every 8 (eight) hours as needed for pain.   Yes Historical Provider, MD  temazepam (RESTORIL) 15 MG capsule Take 15 mg by mouth at bedtime.   Yes Historical Provider, MD  traMADol (ULTRAM) 50 MG tablet Take 50 mg by mouth every 6 (six) hours as needed for moderate pain.    Yes Historical Provider, MD  triamcinolone cream (KENALOG) 0.1 % Apply 1 application topically every morning. APPLY TO CHEST AND BACK EVERY MORNING FOR GROVER DISEASE   Yes Historical Provider, MD   BP 158/81 mmHg  Pulse 79  Temp(Src) 98.3 F (36.8 C) (Oral)  Resp 20  SpO2 100% Physical Exam  Constitutional: He is oriented to person, place, and time. He appears well-developed and well-nourished. No distress.  HENT:  Head: Normocephalic and atraumatic.  Cardiovascular: Normal rate and regular rhythm.   Pulmonary/Chest: Effort normal. No respiratory distress.  Abdominal: Soft. Bowel sounds are normal. There is no tenderness. There is no rebound.  Musculoskeletal: He exhibits no edema.  Neurological: He is alert and oriented to person, place, and time.  Skin: Skin is warm and dry.  Psychiatric: He has a normal mood and affect.  Nursing note and vitals reviewed.   ED Course  Procedures (including critical care time) Labs Review Labs Reviewed  BASIC METABOLIC PANEL - Abnormal; Notable for the following:    Glucose, Bld 104 (*)    All other components within normal limits  URINE CULTURE  URINALYSIS, ROUTINE W REFLEX MICROSCOPIC (NOT AT [facility])  I-STAT CHEM 8, ED    Imaging Review No results found.   EKG Interpretation None      MDM   Final diagnoses:  Urinary retention    Patient presents with concerns for urinary retention. Baseline self caths but has required more self caths over the last 2 days.  Was able to spontaneously void when he got here. No evidence of urinary tract infection. Kidney  function is reassuring. No evidence of acute kidney injury. I had a long discussion with the patient regarding options. I offered to place an indwelling Foley given that he has had 2 in and out cath himself several times and is meeting some resistance. He would prefer to not have the Foley. I explained to him that I cannot guarantee that at some point he may not be able to in and out cath himself and at that point he would need to return. Regardless he needs to follow-up closely with Dr. Alinda Money.  After history, exam, and medical workup I feel the patient has been appropriately medically screened and  is safe for discharge home. Pertinent diagnoses were discussed with the patient. Patient was given return precautions.       Merryl Hacker, MD 03/29/15 [phone]

## 2015-03-29 NOTE — ED Notes (Signed)
Pt came in this morning with c/o urinary retention  Pt states he self caths himself and did last night at 11 and again at 3am  Pt states but this morning he was unable to go  Pt states he had the same happen the night before  Pt states he had prostate cancer and had the seed implant and the scarring blocks off his urine  Pt states he has had to have surgery for that in the past  Pt was able to urinate upon arrival to the hospital and states he feels better at this time

## 2015-03-30 LAB — URINE CULTURE
COLONY COUNT: NO GROWTH
CULTURE: NO GROWTH

## 2015-07-02 ENCOUNTER — Encounter: Payer: Self-pay | Admitting: Nurse Practitioner

## 2015-07-02 ENCOUNTER — Ambulatory Visit (INDEPENDENT_AMBULATORY_CARE_PROVIDER_SITE_OTHER): Payer: Medicare Other | Admitting: Nurse Practitioner

## 2015-07-02 VITALS — BP 146/87 | HR 90 | Ht 74.0 in | Wt 173.0 lb

## 2015-07-02 DIAGNOSIS — R269 Unspecified abnormalities of gait and mobility: Secondary | ICD-10-CM | POA: Diagnosis not present

## 2015-07-02 DIAGNOSIS — R42 Dizziness and giddiness: Secondary | ICD-10-CM | POA: Diagnosis not present

## 2015-07-02 NOTE — Patient Instructions (Signed)
Decrease nortriptyline by 1 tablet every week until discontinued, start with the nighttime dose Vertigo dizziness resumes go  back to  original dose Follow-up when necessary

## 2015-07-02 NOTE — Progress Notes (Signed)
I have read the note, and I agree with the clinical assessment and plan.  WILLIS,CHARLES KEITH   

## 2015-07-02 NOTE — Progress Notes (Signed)
[facility]  PATIENT: Jordan Terry DOB: Dec 25, 1931   REASON FOR VISIT: Follow-up for anxiety disorder and chronic dizziness HISTORY FROM: Patient    HISTORY OF PRESENT ILLNESS:Jordan Terry is a 81 year old right-handed white male with a history of an anxiety disorder, and some chronic dizziness issues. He was last seen in this office 01/10/2015. At that time he attempted to taper and discontinue his nortriptyline however his wife called in to the office and said he really needed to take the medication. The patient feels better in the morning, but in the afternoon he begins to feel dizzy, and he feels poorly. The patient has noticed that if he takes Ultram, or if he takes Percocet, he will feel more relaxed. His Ultram is  ordered by a dermatologist at [facility] for his Grover's disease. He continues to have episodes midafternoon where he is more anxious . The patient is on gabapentin, and he currently is on 200 mg 3 times daily. He has recently reduced the dose, and he feels worse. The patient takes Ativan at night, and he is on 30 mg of nortriptyline at night. The patient is followed by Dr. Casimiro Needle for his anxiety disorder. The patient takes Restoril and Ativan in the evening hours. He has failed Wellbutrin, Prozac and Paxil in the past. He denies any recent falls. The patient indicates that his mother also had an anxiety disorder. He returns to this office for an evaluation.    REVIEW OF SYSTEMS: Full 14 system review of systems performed and notable only for those listed, all others are neg:  Constitutional: Fatigue Cardiovascular: neg Ear/Nose/Throat: Hearing loss Skin: Itching Eyes: neg Respiratory: neg Gastroitestinal: In and out caths TWICE daily Hematology/Lymphatic: neg  Endocrine: neg Musculoskeletal: Aching muscles Allergy/Immunology: neg Neurological: Dizziness Psychiatric: Nervousness anxiety followed by psychiatry Sleep : Frequent  wakening   ALLERGIES: Allergies  Allergen Reactions  . Benadryl [Diphenhydramine Hcl]     MAKES HIM GO CRAZY  . Hydrocodone Itching    Small amounts are okay    HOME MEDICATIONS: Outpatient Prescriptions Prior to Visit  Medication Sig Dispense Refill  . folic acid (FOLVITE) 1 MG tablet Take 1 mg by mouth daily. Six days a week. Pt takes every day but on sat.    . gabapentin (NEURONTIN) 100 MG capsule Take 200 mg by mouth 3 (three) times daily.     Marland Kitchen LORazepam (ATIVAN) 0.5 MG tablet Take 1 mg by mouth at bedtime.    . methotrexate 2.5 MG tablet Take 12.5 mg by mouth every Saturday.     . nortriptyline (PAMELOR) 10 MG capsule Take 1 tab mid afternoon and 3 at hs (Patient taking differently: Take 30 mg by mouth 2 (two) times daily. 1 tablet around lunch and 3 tablets at bedtime) 360 capsule 1  . oxyCODONE-acetaminophen (PERCOCET) 7.5-325 MG per tablet Take 0.5-1 tablets by mouth every 8 (eight) hours as needed for pain.    Marland Kitchen temazepam (RESTORIL) 15 MG capsule Take 15 mg by mouth at bedtime.    . traMADol (ULTRAM) 50 MG tablet Take 50 mg by mouth every 6 (six) hours as needed for moderate pain.     Marland Kitchen triamcinolone cream (KENALOG) 0.1 % Apply 1 application topically every morning. APPLY TO CHEST AND BACK EVERY MORNING FOR GROVER DISEASE     No facility-administered medications prior to visit.    PAST MEDICAL HISTORY: Past Medical History  Diagnosis Date  . Grover's disease   . Anxiety   .  Vertigo   . Cancer     prostate  . Anginal pain 20 years ago  . Headache(784.0)     not since retired  . Arthritis   . Bilateral tinnitus     wears hearing aides    PAST SURGICAL HISTORY: Past Surgical History  Procedure Laterality Date  . Shoulder surgery  2012    right  . Knee surgery  1990    "scope"  . Cardiac catheterization  "20 years ago"  . Radioactive seed implant  2002    "for prostate cancer"  . Carpal tunnel release Left 2008  . Nose surgery  1991, 1998  .  Thyroidectomy, partial  1974  . Vasectomy  1970  . Cataract extraction Bilateral 2014  . Cystoscopy with urethral dilatation N/A 08/14/2013    Procedure: CYSTOSCOPY WITH URETHRAL DILATATION BALLOON DILATION OF URETHRAL STRICTURE;  Surgeon: Dutch Gray, MD;  Location: [facility];  Service: Urology;  Laterality: N/A;  BALLOON DILATION   . Cystoscopy with urethral dilatation N/A 05/07/2014    Procedure: CYSTOSCOPY WITH BALLOON DILATION OF URETHRAL STRICTURE;  Surgeon: Raynelle Bring, MD;  Location: [facility];  Service: Urology;  Laterality: N/A;  BALLOON DILATION OF URETHRAL STRICTURE        FAMILY HISTORY: Family History  Problem Relation Age of Onset  . Anxiety disorder Mother   . Cancer Father     colon, lung  . Diabetes Brother   . Cancer Brother   . Diabetes Sister   . Leukemia Sister   . Cancer Sister     breast  . Cancer Sister     breast  . Cancer Sister     thyroid  . Diabetes Sister     SOCIAL HISTORY: Social History   Social History  . Marital Status: Married    Spouse Name: [name]  . Number of Children: 3  . Years of Education: college   Occupational History  . Not on file.   Social History Main Topics  . Smoking status: Never Smoker   . Smokeless tobacco: Never Used  . Alcohol Use: No  . Drug Use: No  . Sexual Activity: Not on file   Other Topics Concern  . Not on file   Social History Narrative   Patient is married [name]) and lives at home with his wife.   Patient has three children.   Patient is retired.   Patient has a college education in [place].   Patient is right-handed.   Patient drinks one cup of coffee daily and one 8 oz of tea daily.     PHYSICAL EXAM  Filed Vitals:   07/02/15 0845  BP: 146/87  Pulse: 90  Height: 6\' 2"  (1.88 m)  Weight: 173 lb (78.472 kg)   Body mass index is 22.2 kg/(m^2). Generalized: Well developed, in no acute distress  Head: normocephalic and atraumatic,. Oropharynx benign  Neck: Supple, no carotid bruits   Musculoskeletal: No deformity   Neurological examination   Mentation: Alert oriented to time, place, history taking. Attention span and concentration appropriate. Recent and remote memory intact. Follows all commands speech and language fluent. Flat affect Cranial nerve II-XII: Pupils were equal round reactive to light extraocular movements were full, visual field were full on confrontational test. Facial sensation and strength were normal. hearing was intact to finger rubbing bilaterally. Uvula tongue midline. head turning and shoulder shrug were normal and symmetric.Tongue protrusion into cheek strength was normal. Motor: normal bulk and tone, full strength in the BUE,  BLE, fine finger movements normal, no pronator drift. No focal weakness Sensory: normal and symmetric to light touch, pinprick, and Vibration,  Coordination: finger-nose-finger, heel-to-shin bilaterally, no dysmetria Reflexes: Symmetric upper and lower, plantar responses were flexor bilaterally. Gait and Station: Rising up from seated position without assistance, normal stance, moderate stride, good arm swing, smooth turning, able to perform tiptoe, and heel walking without difficulty. Tandem gait is mildly unsteady. No assistive device   DIAGNOSTIC DATA (LABS, IMAGING, TESTING) - I reviewed patient records, labs, notes, testing and imaging myself where available.      Component Value Date/Time   NA 139 03/29/2015 0653   K 4.0 03/29/2015 0653   CL 103 03/29/2015 0653   CO2 28 03/29/2015 0653   GLUCOSE 104* 03/29/2015 0653   BUN 13 03/29/2015 0653   CREATININE 0.90 03/29/2015 0653   CALCIUM 9.3 03/29/2015 0653   PROT 6.6 07/05/2011 0805   ALBUMIN 3.7 07/05/2011 0805   AST 10 07/05/2011 0805   ALT 7 07/05/2011 0805   ALKPHOS 54 07/05/2011 0805   BILITOT 0.5 07/05/2011 0805   GFRNONAA >60 03/29/2015 0653   GFRAA >60 03/29/2015 0653   ASSESSMENT AND PLAN  81 y.o. year old male  has a past medical history of  Grover's disease; Anxiety; Vertigo;  Anginal pain (20 years ago); Headache(784.0); Arthritis; and Bilateral tinnitus. here to follow-up. Patient is wanting to discontinue his nortriptyline   Decrease nortriptyline by 1 tablet every week until discontinued, start with the nighttime dose If vertigo dizziness resumes go  back to  original dose Follow-up when necessary, no  follow-up appointment planned but if necessary next with Dr. Jannifer Franklin medicare visit Dennie Bible, [facility], [facility], [facility] [address] [place], [place] [address] [phone]

## 2015-07-08 ENCOUNTER — Telehealth: Payer: Self-pay | Admitting: Neurology

## 2015-07-08 NOTE — Telephone Encounter (Signed)
I called the patient's wife. She stated the patient has dizziness again and needs to go back on his nortriptyline. I advised that according to [facility], NP's note, if the dizziness resumes, the patient should start original dose of nortriptyline (1 tablet mid day and 3 at night). The patient's wife verbalized understanding. They have about 1 month worth of nortriptyline left. I advised them to call back when they need it refilled.

## 2015-07-08 NOTE — Telephone Encounter (Signed)
Pt's wife called and states they are concerned about his medication nortriptyline (PAMELOR) 10 MG capsule. Pt's wife and pt believe it is best if he either stays on the medication or changes to something similar. Please call and advise [phone]. If Dr. Aleene Davidson to keep pt on Nortriptyine pt will need refill wrote. Thank you

## 2015-07-16 ENCOUNTER — Ambulatory Visit: Payer: Medicare Other | Admitting: Nurse Practitioner

## 2015-07-18 ENCOUNTER — Other Ambulatory Visit: Payer: Self-pay

## 2015-07-18 ENCOUNTER — Telehealth: Payer: Self-pay | Admitting: Neurology

## 2015-07-18 MED ORDER — NORTRIPTYLINE HCL 10 MG PO CAPS: ORAL_CAPSULE | ORAL | Status: DC

## 2015-07-18 NOTE — Telephone Encounter (Signed)
Patient called to request refill on nortriptyline (PAMELOR) 10 MG capsule

## 2015-07-18 NOTE — Telephone Encounter (Signed)
Rx has been sent to [facility]. I called the patient to let him know.

## 2016-01-14 ENCOUNTER — Other Ambulatory Visit: Payer: Self-pay | Admitting: Neurology

## 2016-01-14 MED ORDER — NORTRIPTYLINE HCL 10 MG PO CAPS
ORAL_CAPSULE | ORAL | Status: DC
Start: 2016-01-14 — End: 2016-07-21

## 2016-03-08 ENCOUNTER — Inpatient Hospital Stay (HOSPITAL_COMMUNITY)
Admission: EM | Admit: 2016-03-08 | Discharge: 2016-03-12 | DRG: 872 | Disposition: A | Discharge status: Home / Self Care | Payer: Medicare Other | Attending: Internal Medicine | Admitting: Internal Medicine

## 2016-03-08 ENCOUNTER — Emergency Department (HOSPITAL_COMMUNITY): Payer: Medicare Other

## 2016-03-08 ENCOUNTER — Encounter (HOSPITAL_COMMUNITY): Payer: Self-pay

## 2016-03-08 DIAGNOSIS — Z888 Allergy status to other drugs, medicaments and biological substances status: Secondary | ICD-10-CM

## 2016-03-08 DIAGNOSIS — A419 Sepsis, unspecified organism: Secondary | ICD-10-CM | POA: Diagnosis not present

## 2016-03-08 DIAGNOSIS — F419 Anxiety disorder, unspecified: Secondary | ICD-10-CM | POA: Diagnosis present

## 2016-03-08 DIAGNOSIS — Y846 Urinary catheterization as the cause of abnormal reaction of the patient, or of later complication, without mention of misadventure at the time of the procedure: Secondary | ICD-10-CM | POA: Diagnosis present

## 2016-03-08 DIAGNOSIS — Z833 Family history of diabetes mellitus: Secondary | ICD-10-CM

## 2016-03-08 DIAGNOSIS — R7881 Bacteremia: Secondary | ICD-10-CM | POA: Diagnosis not present

## 2016-03-08 DIAGNOSIS — C61 Malignant neoplasm of prostate: Secondary | ICD-10-CM | POA: Diagnosis present

## 2016-03-08 DIAGNOSIS — I959 Hypotension, unspecified: Secondary | ICD-10-CM | POA: Diagnosis present

## 2016-03-08 DIAGNOSIS — N359 Urethral stricture, unspecified: Secondary | ICD-10-CM | POA: Diagnosis present

## 2016-03-08 DIAGNOSIS — N390 Urinary tract infection, site not specified: Secondary | ICD-10-CM | POA: Diagnosis present

## 2016-03-08 DIAGNOSIS — L111 Transient acantholytic dermatosis [Grover]: Secondary | ICD-10-CM | POA: Diagnosis present

## 2016-03-08 DIAGNOSIS — Z818 Family history of other mental and behavioral disorders: Secondary | ICD-10-CM

## 2016-03-08 DIAGNOSIS — R509 Fever, unspecified: Secondary | ICD-10-CM | POA: Diagnosis present

## 2016-03-08 DIAGNOSIS — Z806 Family history of leukemia: Secondary | ICD-10-CM

## 2016-03-08 DIAGNOSIS — Z885 Allergy status to narcotic agent status: Secondary | ICD-10-CM

## 2016-03-08 DIAGNOSIS — R319 Hematuria, unspecified: Secondary | ICD-10-CM | POA: Diagnosis present

## 2016-03-08 DIAGNOSIS — R6520 Severe sepsis without septic shock: Secondary | ICD-10-CM | POA: Diagnosis present

## 2016-03-08 DIAGNOSIS — Z79899 Other long term (current) drug therapy: Secondary | ICD-10-CM | POA: Diagnosis not present

## 2016-03-08 DIAGNOSIS — D899 Disorder involving the immune mechanism, unspecified: Secondary | ICD-10-CM | POA: Diagnosis not present

## 2016-03-08 DIAGNOSIS — Z87448 Personal history of other diseases of urinary system: Secondary | ICD-10-CM | POA: Diagnosis not present

## 2016-03-08 DIAGNOSIS — A4159 Other Gram-negative sepsis: Principal | ICD-10-CM | POA: Diagnosis present

## 2016-03-08 LAB — URINE MICROSCOPIC-ADD ON

## 2016-03-08 LAB — URINALYSIS, ROUTINE W REFLEX MICROSCOPIC
BILIRUBIN URINE: NEGATIVE
Bilirubin Urine: NEGATIVE
GLUCOSE, UA: NEGATIVE mg/dL
GLUCOSE, UA: NEGATIVE mg/dL
KETONES UR: NEGATIVE mg/dL
Ketones, ur: NEGATIVE mg/dL
Nitrite: NEGATIVE
Nitrite: NEGATIVE
PROTEIN: 30 mg/dL — AB
PROTEIN: NEGATIVE mg/dL
Specific Gravity, Urine: 1.014 (ref 1.005–1.030)
Specific Gravity, Urine: 1.008 (ref 1.005–1.030)
pH: 7.5 (ref 5.0–8.0)
pH: 6 (ref 5.0–8.0)

## 2016-03-08 LAB — CBC WITH DIFFERENTIAL/PLATELET
Basophils Absolute: 0 10*3/uL (ref 0.0–0.1)
Basophils Relative: 0 %
EOS PCT: 0 %
Eosinophils Absolute: 0 10*3/uL (ref 0.0–0.7)
HCT: 36.8 % — ABNORMAL LOW (ref 39.0–52.0)
Hemoglobin: 11.9 g/dL — ABNORMAL LOW (ref 13.0–17.0)
LYMPHS PCT: 4 %
Lymphs Abs: 0.3 10*3/uL — ABNORMAL LOW (ref 0.7–4.0)
MCH: 25.6 pg — AB (ref 26.0–34.0)
MCHC: 32.3 g/dL (ref 30.0–36.0)
MCV: 79.3 fL (ref 78.0–100.0)
Monocytes Absolute: 0 10*3/uL — ABNORMAL LOW (ref 0.1–1.0)
Monocytes Relative: 0 %
NEUTROS PCT: 96 %
Neutro Abs: 6 10*3/uL (ref 1.7–7.7)
Platelets: 87 10*3/uL — ABNORMAL LOW (ref 150–400)
RBC: 4.64 MIL/uL (ref 4.22–5.81)
RDW: 18.3 % — AB (ref 11.5–15.5)
WBC: 6.3 10*3/uL (ref 4.0–10.5)

## 2016-03-08 LAB — COMPREHENSIVE METABOLIC PANEL
ALT: 20 U/L (ref 17–63)
ANION GAP: 7 (ref 5–15)
AST: 27 U/L (ref 15–41)
Albumin: 3.9 g/dL (ref 3.5–5.0)
Alkaline Phosphatase: 56 U/L (ref 38–126)
BUN: 17 mg/dL (ref 6–20)
CO2: 27 mmol/L (ref 22–32)
CREATININE: 1.09 mg/dL (ref 0.61–1.24)
Calcium: 8.7 mg/dL — ABNORMAL LOW (ref 8.9–10.3)
Chloride: 102 mmol/L (ref 101–111)
GFR calc Af Amer: >60 mL/min (ref >60)
GFR calc non Af Amer: >60 mL/min (ref >60)
Glucose, Bld: 114 mg/dL — ABNORMAL HIGH (ref 65–99)
Potassium: 3.2 mmol/L — ABNORMAL LOW (ref 3.5–5.1)
SODIUM: 136 mmol/L (ref 135–145)
Total Bilirubin: 2.3 mg/dL — ABNORMAL HIGH (ref 0.3–1.2)
Total Protein: 6.9 g/dL (ref 6.5–8.1)

## 2016-03-08 LAB — I-STAT CG4 LACTIC ACID, ED: Lactic Acid, Venous: 1.49 mmol/L (ref 0.5–2.0)

## 2016-03-08 MED ORDER — ONDANSETRON HCL 4 MG/2ML IJ SOLN
4.0000 mg | Freq: Once | INTRAMUSCULAR | Status: AC | PRN
  Administered 2016-03-08: 4 mg via INTRAVENOUS
  Filled 2016-03-08: qty 2

## 2016-03-08 MED ORDER — PIPERACILLIN-TAZOBACTAM 3.375 G IVPB
3.3750 g | Freq: Once | INTRAVENOUS | Status: AC
  Administered 2016-03-08: 3.375 g via INTRAVENOUS
  Filled 2016-03-08: qty 50

## 2016-03-08 MED ORDER — ACETAMINOPHEN 325 MG PO TABS
650.0000 mg | ORAL_TABLET | Freq: Four times a day (QID) | ORAL | Status: DC | PRN
  Administered 2016-03-09: 650 mg via ORAL
  Filled 2016-03-08: qty 2

## 2016-03-08 MED ORDER — POTASSIUM CHLORIDE CRYS ER 20 MEQ PO TBCR
40.0000 meq | EXTENDED_RELEASE_TABLET | Freq: Once | ORAL | Status: AC
  Administered 2016-03-08: 40 meq via ORAL
  Filled 2016-03-08: qty 2

## 2016-03-08 MED ORDER — LEVOFLOXACIN IN D5W 750 MG/150ML IV SOLN: 750.0000 mg | Freq: Once | INTRAVENOUS | Status: DC

## 2016-03-08 MED ORDER — ACETAMINOPHEN 325 MG PO TABS
650.0000 mg | ORAL_TABLET | Freq: Once | ORAL | Status: AC | PRN
  Administered 2016-03-08: 650 mg via ORAL
  Filled 2016-03-08: qty 2

## 2016-03-08 MED ORDER — NORTRIPTYLINE HCL 10 MG PO CAPS
30.0000 mg | ORAL_CAPSULE | Freq: Every day | ORAL | Status: DC
  Administered 2016-03-08 – 2016-03-11 (×4): 30 mg via ORAL
  Filled 2016-03-08 (×6): qty 3

## 2016-03-08 MED ORDER — LORAZEPAM 1 MG PO TABS
1.0000 mg | ORAL_TABLET | Freq: Every day | ORAL | Status: DC
  Administered 2016-03-08 – 2016-03-11 (×4): 1 mg via ORAL
  Filled 2016-03-08 (×4): qty 1

## 2016-03-08 MED ORDER — GABAPENTIN 100 MG PO CAPS
200.0000 mg | ORAL_CAPSULE | Freq: Three times a day (TID) | ORAL | Status: DC
  Administered 2016-03-08 – 2016-03-12 (×13): 200 mg via ORAL
  Filled 2016-03-08 (×14): qty 2

## 2016-03-08 MED ORDER — DEXTROSE 5 % IV SOLN
1.0000 g | INTRAVENOUS | Status: DC
  Administered 2016-03-08: 1 g via INTRAVENOUS
  Filled 2016-03-08: qty 10

## 2016-03-08 MED ORDER — SODIUM CHLORIDE 0.9 % IV SOLN
INTRAVENOUS | Status: DC
  Administered 2016-03-08 – 2016-03-11 (×7): via INTRAVENOUS

## 2016-03-08 MED ORDER — OXYCODONE-ACETAMINOPHEN 7.5-325 MG PO TABS
0.5000 | ORAL_TABLET | Freq: Three times a day (TID) | ORAL | Status: DC | PRN
  Administered 2016-03-08 – 2016-03-12 (×7): 1 via ORAL
  Filled 2016-03-08 (×7): qty 1

## 2016-03-08 MED ORDER — VANCOMYCIN HCL IN DEXTROSE 1-5 GM/200ML-% IV SOLN
1000.0000 mg | Freq: Once | INTRAVENOUS | Status: AC
  Administered 2016-03-08: 1000 mg via INTRAVENOUS
  Filled 2016-03-08: qty 200

## 2016-03-08 MED ORDER — SODIUM CHLORIDE 0.9 % IV BOLUS (SEPSIS)
1000.0000 mL | Freq: Once | INTRAVENOUS | Status: AC
  Administered 2016-03-08: 1000 mL via INTRAVENOUS

## 2016-03-08 MED ORDER — ACETAMINOPHEN 650 MG RE SUPP
650.0000 mg | Freq: Four times a day (QID) | RECTAL | Status: DC | PRN
Start: 2016-03-08 — End: 2016-03-12

## 2016-03-08 MED ORDER — SODIUM CHLORIDE 0.9% FLUSH
3.0000 mL | Freq: Two times a day (BID) | INTRAVENOUS | Status: DC
  Administered 2016-03-08 – 2016-03-12 (×5): 3 mL via INTRAVENOUS

## 2016-03-08 MED ORDER — TEMAZEPAM 15 MG PO CAPS
15.0000 mg | ORAL_CAPSULE | Freq: Every evening | ORAL | Status: DC | PRN
  Administered 2016-03-08 – 2016-03-11 (×3): 15 mg via ORAL
  Filled 2016-03-08 (×3): qty 1

## 2016-03-08 NOTE — ED Notes (Signed)
MD at bedside. 

## 2016-03-08 NOTE — ED Notes (Signed)
Patient transported to X-ray 

## 2016-03-08 NOTE — ED Notes (Signed)
Patient has a history of prostate cancer and has to cath himself twice a day. Patient had a fever at home (102.1). Patient also c/o hematuria, chills, and nausea.

## 2016-03-08 NOTE — ED Notes (Signed)
EDP at bedside  

## 2016-03-08 NOTE — ED Provider Notes (Signed)
CSN: [id number]     Arrival date & time 03/08/16  [id number] History   First MD Initiated Contact with Patient 03/08/16 0902     Chief Complaint  Patient presents with  . Fever  . Hematuria     (Consider location/radiation/quality/duration/timing/severity/associated sxs/prior Treatment) HPI Comments: Patient is an 82 year old male with past medical history of prostate cancer. He performs self caths twice daily. He has noticed blood when he caths and urinates. Yesterday evening he developed low-grade fever of 100 and throughout the night he remained chilled and unable to get warm. He feels nauseated but denies any abdominal pain, chest pain, cough, or congestion. He spoke with his urologist, Dr. Alinda Money who told him to come to the ER for evaluation.  Primary care doctor is Dr. Philip Aspen of [facility] medical.  Patient is a 82 y.o. male presenting with fever and hematuria. The history is provided by the patient.  Fever Max temp prior to arrival:  100 Temp source:  Oral Severity:  Moderate Onset quality:  Sudden Timing:  Constant Progression:  Worsening Chronicity:  New Relieved by:  Nothing Worsened by:  Nothing tried Ineffective treatments:  None tried Associated symptoms: chills and nausea   Associated symptoms comment:  Hematuria Hematuria    Past Medical History  Diagnosis Date  . Grover's disease   . Anxiety   . Vertigo   . Cancer [facility])     prostate  . Anginal pain ([place]) 20 years ago  . Headache(784.0)     not since retired  . Arthritis   . Bilateral tinnitus     wears hearing aides  . Renal disorder    Past Surgical History  Procedure Laterality Date  . Shoulder surgery  2012    right  . Knee surgery  1990    "scope"  . Cardiac catheterization  "20 years ago"  . Radioactive seed implant  2002    "for prostate cancer"  . Carpal tunnel release Left 2008  . Nose surgery  1991, 1998  . Thyroidectomy, partial  1974  . Vasectomy  1970  . Cataract extraction Bilateral  2014  . Cystoscopy with urethral dilatation N/A 08/14/2013    Procedure: CYSTOSCOPY WITH URETHRAL DILATATION BALLOON DILATION OF URETHRAL STRICTURE;  Surgeon: Dutch Gray, MD;  Location: [facility];  Service: Urology;  Laterality: N/A;  BALLOON DILATION   . Cystoscopy with urethral dilatation N/A 05/07/2014    Procedure: CYSTOSCOPY WITH BALLOON DILATION OF URETHRAL STRICTURE;  Surgeon: Raynelle Bring, MD;  Location: [facility];  Service: Urology;  Laterality: N/A;  BALLOON DILATION OF URETHRAL STRICTURE       Family History  Problem Relation Age of Onset  . Anxiety disorder Mother   . Cancer Father     colon, lung  . Diabetes Brother   . Cancer Brother   . Diabetes Sister   . Leukemia Sister   . Cancer Sister     breast  . Cancer Sister     breast  . Cancer Sister     thyroid  . Diabetes Sister    Social History  Substance Use Topics  . Smoking status: Never Smoker   . Smokeless tobacco: Never Used  . Alcohol Use: No    Review of Systems  Constitutional: Positive for fever and chills.  Gastrointestinal: Positive for nausea.  Genitourinary: Positive for hematuria.  All other systems reviewed and are negative.     Allergies  Benadryl and Hydrocodone  Home Medications   Prior to Admission medications  Medication Sig Start Date End Date Taking? Authorizing Provider  folic acid (FOLVITE) 1 MG tablet Take 1 mg by mouth daily. Six days a week. Pt takes every day but on sat.    Historical Provider, MD  gabapentin (NEURONTIN) 100 MG capsule Take 200 mg by mouth 3 (three) times daily.     Historical Provider, MD  LORazepam (ATIVAN) 0.5 MG tablet Take 1 mg by mouth at bedtime.    Historical Provider, MD  methotrexate 2.5 MG tablet Take 12.5 mg by mouth every Saturday.     Historical Provider, MD  nortriptyline (PAMELOR) 10 MG capsule Take 1 tab mid afternoon and 3 at hs 01/14/16   Kathrynn Ducking, MD  oxyCODONE-acetaminophen (PERCOCET) 7.5-325 MG per tablet Take 0.5-1 tablets by  mouth every 8 (eight) hours as needed for pain.    Historical Provider, MD  temazepam (RESTORIL) 15 MG capsule Take 15 mg by mouth at bedtime.    Historical Provider, MD  traMADol (ULTRAM) 50 MG tablet Take 50 mg by mouth every 6 (six) hours as needed for moderate pain.     Historical Provider, MD  triamcinolone cream (KENALOG) 0.1 % Apply 1 application topically every morning. APPLY TO CHEST AND BACK EVERY MORNING FOR [place] DISEASE    Historical Provider, MD   BP 132/74 mmHg  Pulse 116  Temp(Src) 102.4 F (39.1 C) (Oral)  Resp 18  Ht 6\' 2"  (1.88 m)  Wt 170 lb (77.111 kg)  BMI 21.82 kg/m2  SpO2 95% Physical Exam  Constitutional: He is oriented to person, place, and time. He appears well-developed and well-nourished. No distress.  HENT:  Head: Normocephalic and atraumatic.  Mouth/Throat: Oropharynx is clear and moist.  Neck: Normal range of motion. Neck supple.  Cardiovascular: Normal rate, regular rhythm and normal heart sounds.   No murmur heard. Pulmonary/Chest: Effort normal and breath sounds normal. No respiratory distress. He has no wheezes. He has no rales.  Abdominal: Soft. Bowel sounds are normal. He exhibits no distension. There is no tenderness.  Musculoskeletal: Normal range of motion. He exhibits no edema.  Lymphadenopathy:    He has no cervical adenopathy.  Neurological: He is alert and oriented to person, place, and time.  Skin: Skin is warm and dry. He is not diaphoretic.  Nursing note and vitals reviewed.   ED Course  Procedures (including critical care time) Labs Review Labs Reviewed  CULTURE, BLOOD (ROUTINE X 2)  CULTURE, BLOOD (ROUTINE X 2)  URINE CULTURE  COMPREHENSIVE METABOLIC PANEL  CBC WITH DIFFERENTIAL/PLATELET  URINALYSIS, ROUTINE W REFLEX MICROSCOPIC (NOT AT [facility])  I-STAT CG4 LACTIC ACID, ED    Imaging Review No results found. I have personally reviewed and evaluated these images and lab results as part of my medical decision-making.   EKG  Interpretation   Date/Time:  Sunday Mar 08 2016 10:41:44 EDT Ventricular Rate:  108 PR Interval:  167 QRS Duration: 98 QT Interval:  334 QTC Calculation: 448 R Axis:   76 Text Interpretation:  Sinus tachycardia Probable left ventricular  hypertrophy Confirmed by Teka Chanda  MD, Harlyn Italiano ([id number]) on 03/08/2016 11:10:53  AM      MDM   Final diagnoses:  None    Patient with history of prostate cancer who performs self caths at home. He presents for evaluation of fever, chills, and rigors that started yesterday evening. He is now having hematuria as well. My initial impression was that his fever was related to a UTI, however the urine is not convincing for  this. Blood cultures were obtained and he was hydrated. He was febrile upon presentation with a temp of 102.4 and remains tachycardic while in the ER. He was treated with broad-spectrum antibiotics and will be admitted for rule out of aseptic condition and further observation. I've spoken with Dr. Coralyn Pear who agrees to admit.  CRITICAL CARE Performed by: Veryl Speak Total critical care time: 30 minutes Critical care time was exclusive of separately billable procedures and treating other patients. Critical care was necessary to treat or prevent imminent or life-threatening deterioration. Critical care was time spent personally by me on the following activities: development of treatment plan with patient and/or surrogate as well as nursing, discussions with consultants, evaluation of patient's response to treatment, examination of patient, obtaining history from patient or surrogate, ordering and performing treatments and interventions, ordering and review of laboratory studies, ordering and review of radiographic studies, pulse oximetry and re-evaluation of patient's condition.     Veryl Speak, MD 03/08/16 [id number]

## 2016-03-08 NOTE — H&P (Signed)
History and Physical    Jordan Terry [id number] DOB: 08/06/1932 DOA: 03/08/2016  PCP: Jordan Lopes, MD  Patient coming from: Home  Chief Complaint: Fevers, chills, hematuria  HPI: Jordan Terry is a 82 y.o. male with medical history significant of prostate cancer, urethral stricture undergoing self catheterizations, currently follows Dr. Alinda Terry of urology undergoing cystoscopy with balloon dilatation of urethral stricture on 05/07/2014, presents to the emergency department with complaints of fevers. He states that 2 days ago had trauma with self-catheterization after which he started noting hematuria which has remained fairly constant. Then yesterday he developed a low-grade temperature of 100. He spoke with his urologist Dr. Alinda Terry who recommended that he present to the emergency room for further workup and evaluation.  On presentation he was found to have a temperature of 102.4 with a pulse of 120.  He denies shortness of breath, cough, sputum production, chest pain, diarrhea, purulence or foul-smelling urine.  ED Course: In the emergency room found to have a temperature 102.4, he was given a dose of IV Zosyn. Lab work revealed a white count of 6300 with a lactic acid of 1.49. Urinalysis showed a few bacteria with small amount of leukocytes and negative for nitrates.  Review of Systems: As per HPI otherwise 10 point review of systems negative.   Past Medical History  Diagnosis Date  . Grover's disease   . Anxiety   . Vertigo   . Cancer [facility])     prostate  . Anginal pain ([place]) 20 years ago  . Headache(784.0)     not since retired  . Arthritis   . Bilateral tinnitus     wears hearing aides  . Renal disorder     Past Surgical History  Procedure Laterality Date  . Shoulder surgery  2012    right  . Knee surgery  1990    "scope"  . Cardiac catheterization  "20 years ago"  . Radioactive seed implant  2002    "for prostate cancer"  . Carpal tunnel release Left 2008  .  Nose surgery  1991, 1998  . Thyroidectomy, partial  1974  . Vasectomy  1970  . Cataract extraction Bilateral 2014  . Cystoscopy with urethral dilatation N/A 08/14/2013    Procedure: CYSTOSCOPY WITH URETHRAL DILATATION BALLOON DILATION OF URETHRAL STRICTURE;  Surgeon: Jordan Gray, MD;  Location[facility];  Service: Urology;  Laterality: N/A;  BALLOON DILATION   . Cystoscopy with urethral dilatation N/A 05/07/2014    Procedure: CYSTOSCOPY WITH BALLOON DILATION OF URETHRAL STRICTURE;  Surgeon: Jordan Bring, MD;  Location: [facility];  Service: Urology;  Laterality: N/A;  BALLOON DILATION OF URETHRAL STRICTURE         reports that he has never smoked. He has never used smokeless tobacco. He reports that he does not drink alcohol or use illicit drugs.  Allergies  Allergen Reactions  . Benadryl [Diphenhydramine Hcl]     MAKES HIM GO CRAZY  . Hydrocodone Itching    Small amounts are okay    Family History  Problem Relation Age of Onset  . Anxiety disorder Mother   . Cancer Father     colon, lung  . Diabetes Brother   . Cancer Brother   . Diabetes Sister   . Leukemia Sister   . Cancer Sister     breast  . Cancer Sister     breast  . Cancer Sister     thyroid  . Diabetes Sister     Prior to  Admission medications   Medication Sig Start Date End Date Taking? Authorizing Provider  folic acid (FOLVITE) 1 MG tablet Take 1 mg by mouth daily. Six days a week. Pt takes every day but on sat.   Yes Historical Provider, MD  gabapentin (NEURONTIN) 100 MG capsule Take 200 mg by mouth 3 (three) times daily.    Yes Historical Provider, MD  LORazepam (ATIVAN) 0.5 MG tablet Take 1 mg by mouth at bedtime.   Yes Historical Provider, MD  methotrexate 2.5 MG tablet Take 12.5 mg by mouth every Saturday.    Yes Historical Provider, MD  nortriptyline (PAMELOR) 10 MG capsule Take 1 tab mid afternoon and 3 at hs Patient taking differently: Take 30 mg by mouth at bedtime.  01/14/16  Yes Jordan Ducking, MD    oxyCODONE-acetaminophen (PERCOCET) 7.5-325 MG per tablet Take 0.5-1 tablets by mouth every 8 (eight) hours as needed (pain.).    Yes Historical Provider, MD  temazepam (RESTORIL) 15 MG capsule Take 15 mg by mouth at bedtime.   Yes Historical Provider, MD  traMADol (ULTRAM) 50 MG tablet Take 50 mg by mouth every 6 (six) hours as needed for moderate pain.    Yes Historical Provider, MD  triamcinolone cream (KENALOG) 0.1 % Apply 1 application topically every morning. APPLY TO CHEST AND BACK EVERY MORNING FOR [place] DISEASE   Yes Historical Provider, MD    Physical Exam: Filed Vitals:   03/08/16 1021 03/08/16 1030 03/08/16 1100 03/08/16 1101  BP: 108/63 117/67 100/58 116/67  Pulse: 110 108 107 103  Temp:      TempSrc:      Resp: 18  27 24   Height:      Weight:      SpO2: 93% 91% 93% 96%    Constitutional: NAD, calm, comfortable, is awake alert nontoxic appearing Filed Vitals:   03/08/16 1021 03/08/16 1030 03/08/16 1100 03/08/16 1101  BP: 108/63 117/67 100/58 116/67  Pulse: 110 108 107 103  Temp:      TempSrc:      Resp: 18  27 24   Height:      Weight:      SpO2: 93% 91% 93% 96%   Eyes: PERRL, lids and conjunctivae normal ENMT: Mucous membranes are moist. Posterior pharynx clear of any exudate or lesions.Normal dentition.  Neck: normal, supple, no masses, no thyromegaly Respiratory: clear to auscultation bilaterally, no wheezing, no crackles. Normal respiratory effort. No accessory muscle use.  Cardiovascular: Regular rate and rhythm, no murmurs / rubs / gallops. No extremity edema. 2+ pedal pulses. No carotid bruits.  Abdomen: no tenderness, no masses palpated. No hepatosplenomegaly. Bowel sounds positive.  Musculoskeletal: no clubbing / cyanosis. No joint deformity upper and lower extremities. Good ROM, no contractures. Normal muscle tone.  Skin: no rashes, lesions, ulcers. No induration Neurologic: CN 2-12 grossly intact. Sensation intact, DTR normal. Strength 5/5 in all 4.   Psychiatric: Normal judgment and insight. Alert and oriented x 3. Normal mood.    Labs on Admission: I have personally reviewed following labs and imaging studies  CBC:  Recent Labs Lab 03/08/16 0853  WBC 6.3  NEUTROABS 6.0  HGB 11.9*  HCT 36.8*  MCV 79.3  PLT 87*   Basic Metabolic Panel:  Recent Labs Lab 03/08/16 0853  NA 136  K 3.2*  CL 102  CO2 27  GLUCOSE 114*  BUN 17  CREATININE 1.09  CALCIUM 8.7*   GFR: Estimated Creatinine Clearance: 58 mL/min (by C-G formula based on Cr  of 1.09). Liver Function Tests:  Recent Labs Lab 03/08/16 0853  AST 27  ALT 20  ALKPHOS 56  BILITOT 2.3*  PROT 6.9  ALBUMIN 3.9   No results for input(s): LIPASE, AMYLASE in the last 168 hours. No results for input(s): AMMONIA in the last 168 hours. Coagulation Profile: No results for input(s): INR, PROTIME in the last 168 hours. Cardiac Enzymes: No results for input(s): CKTOTAL, CKMB, CKMBINDEX, TROPONINI in the last 168 hours. BNP (last 3 results) No results for input(s): PROBNP in the last 8760 hours. HbA1C: No results for input(s): HGBA1C in the last 72 hours. CBG: No results for input(s): GLUCAP in the last 168 hours. Lipid Profile: No results for input(s): CHOL, HDL, LDLCALC, TRIG, CHOLHDL, LDLDIRECT in the last 72 hours. Thyroid Function Tests: No results for input(s): TSH, T4TOTAL, FREET4, T3FREE, THYROIDAB in the last 72 hours. Anemia Panel: No results for input(s): VITAMINB12, FOLATE, FERRITIN, TIBC, IRON, RETICCTPCT in the last 72 hours. Urine analysis:    Component Value Date/Time   COLORURINE RED* 03/08/2016 0915   APPEARANCEUR TURBID* 03/08/2016 0915   LABSPEC 1.014 03/08/2016 0915   PHURINE 7.5 03/08/2016 0915   GLUCOSEU NEGATIVE 03/08/2016 0915   HGBUR LARGE* 03/08/2016 0915   BILIRUBINUR NEGATIVE 03/08/2016 0915   KETONESUR NEGATIVE 03/08/2016 0915   PROTEINUR 30* 03/08/2016 0915   UROBILINOGEN 0.2 03/29/2015 0611   NITRITE NEGATIVE 03/08/2016 0915    LEUKOCYTESUR SMALL* 03/08/2016 0915   Sepsis Labs: !!!!!!!!!!!!!!!!!!!!!!!!!!!!!!!!!!!!!!!!!!!! @LABRCNTIP (procalcitonin:4,lacticidven:4) )No results found for this or any previous visit (from the past 240 hour(s)).   Radiological Exams on Admission: Dg Chest 2 View  03/08/2016  CLINICAL DATA:  Shortness of breath and fever today. History of prostate cancer. EXAM: CHEST  2 VIEW COMPARISON:  08/08/2013 FINDINGS: Lungs are well inflated and otherwise clear. Cardiomediastinal silhouette is within normal. There is mild degenerative change of the spine. IMPRESSION: No active cardiopulmonary disease. Electronically Signed   By: Marin Olp M.D.   On: 03/08/2016 09:20    EKG: Independently reviewed.   Assessment/Plan Principal Problem:   Fever and chills Active Problems:   Prostate cancer (HCC)   Hematuria   Fever   1.  Possible urinary tract infection. Mr Nola is a pleasant 82 year old gentleman with a past medical history of prostate cancer and urethral stricture who self catheterizes daily, having catheter associated trauma several days ago after which he developed hematuria. With in the last 24 hours he has developed fevers and chills, having a temperature of 102.4 in the emergency department. Follow-up on blood cultures and urine cultures. Will start empiric IV antibiotic therapy with ceftriaxone 1 g IV every 24 hours.  2.  Catheter trauma. Patient reporting catheter trauma after self catheterizing, having hematuria over the last several days. Case was discussed with his urologist Dr Jordan Terry who evaluated patient in the emergency department. Will likely place a Foley catheter in him. Treat empirically with ceftriaxone otherwise await further recommendations from neurology.  3.  History of anxiety. Continue Ativan and nortriptyline at bedtime.   DVT prophylaxis: SCDs Code Status: Full code Family Communication: Spoke with his wife was present at bedside Disposition Plan: Admit to the  inpatient service, anticipate he will require greater than 2 night hospitalization Consults called: Dr. Alinda Terry of urology Admission status: Inpatient   Kelvin Cellar MD [facility] Pager [phone]  If 7PM-7AM, please contact night-coverage [email or web address] Password [facility]  03/08/2016, 11:25 AM

## 2016-03-08 NOTE — Consult Note (Signed)
Urology Consult   Physician requesting consult: Dr. Coralyn Pear  Reason for consult: Urethral bleeding and fever and difficult urethral catheterization  History of Present Illness: Jordan Terry is a 82 y.o. well known to me with a history of prostate cancer (in remission) and recurrent urethral stricture disease which he manages by catheterizing 1-2 times per day. He had difficulty catheterizing yesterday and could not pass the catheter all the way in.  He then had gross bloody urethral drainage.  This let up throughout the day but I instructed him not to catheterize last night.  He persisted in having urethral bleeding this morning and developed high fever an chills.  He presented to the ED today.   Past Medical History  Diagnosis Date  . Grover's disease   . Anxiety   . Vertigo   . Cancer [facility])     prostate  . Anginal pain ([place]) 20 years ago  . Headache(784.0)     not since retired  . Arthritis   . Bilateral tinnitus     wears hearing aides  . Renal disorder     Past Surgical History  Procedure Laterality Date  . Shoulder surgery  2012    right  . Knee surgery  1990    "scope"  . Cardiac catheterization  "20 years ago"  . Radioactive seed implant  2002    "for prostate cancer"  . Carpal tunnel release Left 2008  . Nose surgery  1991, 1998  . Thyroidectomy, partial  1974  . Vasectomy  1970  . Cataract extraction Bilateral 2014  . Cystoscopy with urethral dilatation N/A 08/14/2013    Procedure: CYSTOSCOPY WITH URETHRAL DILATATION BALLOON DILATION OF URETHRAL STRICTURE;  Surgeon: Dutch Gray, MD;  Location: [facility];  Service: Urology;  Laterality: N/A;  BALLOON DILATION   . Cystoscopy with urethral dilatation N/A 05/07/2014    Procedure: CYSTOSCOPY WITH BALLOON DILATION OF URETHRAL STRICTURE;  Surgeon: Raynelle Bring, MD;  Location: [facility];  Service: Urology;  Laterality: N/A;  BALLOON DILATION OF URETHRAL STRICTURE        Medications:  Home meds:    Medication List    ASK your doctor about these medications        folic acid 1 MG tablet  Commonly known as:  FOLVITE  Take 1 mg by mouth daily. Six days a week. Pt takes every day but on sat.     gabapentin 100 MG capsule  Commonly known as:  NEURONTIN  Take 200 mg by mouth 3 (three) times daily.     LORazepam 0.5 MG tablet  Commonly known as:  ATIVAN  Take 1 mg by mouth at bedtime.     methotrexate 2.5 MG tablet  Take 12.5 mg by mouth every Saturday.     nortriptyline 10 MG capsule  Commonly known as:  PAMELOR  Take 1 tab mid afternoon and 3 at hs     oxyCODONE-acetaminophen 7.5-325 MG tablet  Commonly known as:  PERCOCET  Take 0.5-1 tablets by mouth every 8 (eight) hours as needed (pain.).     temazepam 15 MG capsule  Commonly known as:  RESTORIL  Take 15 mg by mouth at bedtime.     traMADol 50 MG tablet  Commonly known as:  ULTRAM  Take 50 mg by mouth every 6 (six) hours as needed for moderate pain.     triamcinolone cream 0.1 %  Commonly known as:  KENALOG  Apply 1 application topically every morning. APPLY TO CHEST AND  BACK EVERY MORNING FOR GROVER DISEASE        Scheduled Meds: . cefTRIAXone (ROCEPHIN)  IV  1 g Intravenous Q24H  . gabapentin  200 mg Oral TID  . LORazepam  1 mg Oral QHS  . nortriptyline  30 mg Oral QHS  . sodium chloride flush  3 mL Intravenous Q12H   Continuous Infusions: . sodium chloride     PRN Meds:.acetaminophen **OR** acetaminophen, oxyCODONE-acetaminophen  Allergies:  Allergies  Allergen Reactions  . Benadryl [Diphenhydramine Hcl]     MAKES HIM GO CRAZY  . Hydrocodone Itching    Small amounts are okay    Family History  Problem Relation Age of Onset  . Anxiety disorder Mother   . Cancer Father     colon, lung  . Diabetes Brother   . Cancer Brother   . Diabetes Sister   . Leukemia Sister   . Cancer Sister     breast  . Cancer Sister     breast  . Cancer Sister     thyroid  . Diabetes Sister     Social History:  reports that  he has never smoked. He has never used smokeless tobacco. He reports that he does not drink alcohol or use illicit drugs.  ROS: A complete review of systems was performed.  All systems are negative except for pertinent findings as noted.  Physical Exam:  Vital signs in last 24 hours: Temp:  [99.5 F (37.5 C)-102.4 F (39.1 C)] 99.5 F (37.5 C) (05/14 1247) Pulse Rate:  [96-120] 96 (05/14 1247) Resp:  [15-27] 16 (05/14 1247) BP: (96-150)/(46-79) 97/46 mmHg (05/14 1247) SpO2:  [91 %-96 %] 95 % (05/14 1247) Weight:  [77.111 kg (170 lb)] 77.111 kg (170 lb) (05/14 0853) Constitutional:  Alert and oriented, No acute distress Cardiovascular: Regular rate and rhythm, No JVD Respiratory: Normal respiratory effort, Lungs clear bilaterally GI: Abdomen is soft, nontender, nondistended, no abdominal masses Genitourinary: No CVAT. Normal male phallus, testes are descended bilaterally and non-tender and without masses, scrotum is normal in appearance without lesions or masses, perineum is normal on inspection.  Blood at the urethral meatus. Lymphatic: No lymphadenopathy Neurologic: Grossly intact, no focal deficits Psychiatric: Normal mood and affect  Laboratory Data:   Recent Labs  03/08/16 0853  WBC 6.3  HGB 11.9*  HCT 36.8*  PLT 87*     Recent Labs  03/08/16 0853  NA 136  K 3.2*  CL 102  GLUCOSE 114*  BUN 17  CALCIUM 8.7*  CREATININE 1.09     Results for orders placed or performed during the hospital encounter of 03/08/16 (from the past 24 hour(s))  Comprehensive metabolic panel     Status: Abnormal   Collection Time: 03/08/16  8:53 AM  Result Value Ref Range   Sodium 136 135 - 145 mmol/L   Potassium 3.2 (L) 3.5 - 5.1 mmol/L   Chloride 102 101 - 111 mmol/L   CO2 27 22 - 32 mmol/L   Glucose, Bld 114 (H) 65 - 99 mg/dL   BUN 17 6 - 20 mg/dL   Creatinine, Ser 1.09 0.61 - 1.24 mg/dL   Calcium 8.7 (L) 8.9 - 10.3 mg/dL   Total Protein 6.9 6.5 - 8.1 g/dL   Albumin 3.9 3.5 -  5.0 g/dL   AST 27 15 - 41 U/L   ALT 20 17 - 63 U/L   Alkaline Phosphatase 56 38 - 126 U/L   Total Bilirubin 2.3 (H) 0.3 - 1.2 mg/dL  GFR calc non Af Amer >60 >60 mL/min   GFR calc Af Amer >60 >60 mL/min   Anion gap 7 5 - 15  CBC with Differential     Status: Abnormal   Collection Time: 03/08/16  8:53 AM  Result Value Ref Range   WBC 6.3 4.0 - 10.5 K/uL   RBC 4.64 4.22 - 5.81 MIL/uL   Hemoglobin 11.9 (L) 13.0 - 17.0 g/dL   HCT 36.8 (L) 39.0 - 52.0 %   MCV 79.3 78.0 - 100.0 fL   MCH 25.6 (L) 26.0 - 34.0 pg   MCHC 32.3 30.0 - 36.0 g/dL   RDW 18.3 (H) 11.5 - 15.5 %   Platelets 87 (L) 150 - 400 K/uL   Neutrophils Relative % 96 %   Lymphocytes Relative 4 %   Monocytes Relative 0 %   Eosinophils Relative 0 %   Basophils Relative 0 %   Neutro Abs 6.0 1.7 - 7.7 K/uL   Lymphs Abs 0.3 (L) 0.7 - 4.0 K/uL   Monocytes Absolute 0.0 (L) 0.1 - 1.0 K/uL   Eosinophils Absolute 0.0 0.0 - 0.7 K/uL   Basophils Absolute 0.0 0.0 - 0.1 K/uL   RBC Morphology ELLIPTOCYTES   I-Stat CG4 Lactic Acid, ED     Status: None   Collection Time: 03/08/16  9:03 AM  Result Value Ref Range   Lactic Acid, Venous 1.49 0.5 - 2.0 mmol/L  Urinalysis, Routine w reflex microscopic     Status: Abnormal   Collection Time: 03/08/16  9:15 AM  Result Value Ref Range   Color, Urine RED (A) YELLOW   APPearance TURBID (A) CLEAR   Specific Gravity, Urine 1.014 1.005 - 1.030   pH 7.5 5.0 - 8.0   Glucose, UA NEGATIVE NEGATIVE mg/dL   Hgb urine dipstick LARGE (A) NEGATIVE   Bilirubin Urine NEGATIVE NEGATIVE   Ketones, ur NEGATIVE NEGATIVE mg/dL   Protein, ur 30 (A) NEGATIVE mg/dL   Nitrite NEGATIVE NEGATIVE   Leukocytes, UA SMALL (A) NEGATIVE  Urine microscopic-add on     Status: Abnormal   Collection Time: 03/08/16  9:15 AM  Result Value Ref Range   Squamous Epithelial / LPF 0-5 (A) NONE SEEN   WBC, UA 6-30 0 - 5 WBC/hpf   RBC / HPF TOO NUMEROUS TO COUNT 0 - 5 RBC/hpf   Bacteria, UA FEW (A) NONE SEEN   No results  found for this or any previous visit (from the past 240 hour(s)).  Renal Function:  Recent Labs  03/08/16 0853  CREATININE 1.09   Estimated Creatinine Clearance: 58 mL/min (by C-G formula based on Cr of 1.09).  Radiologic Imaging: Dg Chest 2 View  03/08/2016  CLINICAL DATA:  Shortness of breath and fever today. History of prostate cancer. EXAM: CHEST  2 VIEW COMPARISON:  08/08/2013 FINDINGS: Lungs are well inflated and otherwise clear. Cardiomediastinal silhouette is within normal. There is mild degenerative change of the spine. IMPRESSION: No active cardiopulmonary disease. Electronically Signed   By: Marin Olp M.D.   On: 03/08/2016 09:20    I independently reviewed the above imaging studies.  Procedure: I attempted to place a 14 Fr catheter without success under sterile conditions.  I therefore performed flexible cystoscopy at the bedside identifying his urethral stricture in the bulbar urethra.  I inserted a Sensor guidewire and balloon dilated his stricture to 24 Fr with the Nephromax balloon.  I then placed an 18Fr council catheter over the wire with return of mostly clear  urine.  Impression/Recommendation 1) Urethral stricture/trauma: Leave catheter indwelling for now which will tamponade urethral bleeding and offer adequate urine drainage.  2) Fever: Source unclear.  Initial UA did not appear infected but I will send a new UA and culture following catheter placement.  Asa Fath,LES 03/08/2016, 1:45 PM    Pryor Curia MD  CC: Dr. Coralyn Pear

## 2016-03-09 DIAGNOSIS — A419 Sepsis, unspecified organism: Secondary | ICD-10-CM | POA: Diagnosis present

## 2016-03-09 LAB — BLOOD CULTURE ID PANEL (REFLEXED)
Acinetobacter baumannii: NOT DETECTED
CANDIDA ALBICANS: NOT DETECTED
CANDIDA KRUSEI: NOT DETECTED
CANDIDA PARAPSILOSIS: NOT DETECTED
CARBAPENEM RESISTANCE: NOT DETECTED
Candida glabrata: NOT DETECTED
Candida tropicalis: NOT DETECTED
ENTEROBACTER CLOACAE COMPLEX: NOT DETECTED
ENTEROCOCCUS SPECIES: NOT DETECTED
Enterobacteriaceae species: DETECTED — AB
Escherichia coli: NOT DETECTED
Haemophilus influenzae: NOT DETECTED
KLEBSIELLA OXYTOCA: NOT DETECTED
KLEBSIELLA PNEUMONIAE: DETECTED — AB
Listeria monocytogenes: NOT DETECTED
METHICILLIN RESISTANCE: NOT DETECTED
Neisseria meningitidis: NOT DETECTED
PROTEUS SPECIES: NOT DETECTED
PSEUDOMONAS AERUGINOSA: NOT DETECTED
STAPHYLOCOCCUS AUREUS BCID: NOT DETECTED
STREPTOCOCCUS AGALACTIAE: NOT DETECTED
STREPTOCOCCUS PNEUMONIAE: NOT DETECTED
STREPTOCOCCUS SPECIES: NOT DETECTED
Serratia marcescens: NOT DETECTED
Staphylococcus species: NOT DETECTED
Streptococcus pyogenes: NOT DETECTED
VANCOMYCIN RESISTANCE: NOT DETECTED

## 2016-03-09 LAB — URINE CULTURE: CULTURE: NO GROWTH

## 2016-03-09 LAB — BASIC METABOLIC PANEL
Anion gap: 5 (ref 5–15)
BUN: 20 mg/dL (ref 6–20)
CALCIUM: 7.7 mg/dL — AB (ref 8.9–10.3)
CHLORIDE: 108 mmol/L (ref 101–111)
CO2: 25 mmol/L (ref 22–32)
CREATININE: 1.15 mg/dL (ref 0.61–1.24)
GFR calc Af Amer: >60 mL/min (ref >60)
GFR calc non Af Amer: 58 mL/min — ABNORMAL LOW (ref >60)
GLUCOSE: 127 mg/dL — AB (ref 65–99)
Potassium: 3.8 mmol/L (ref 3.5–5.1)
Sodium: 138 mmol/L (ref 135–145)

## 2016-03-09 LAB — CBC
HCT: 30.1 % — ABNORMAL LOW (ref 39.0–52.0)
Hemoglobin: 9.6 g/dL — ABNORMAL LOW (ref 13.0–17.0)
MCH: 25.9 pg — AB (ref 26.0–34.0)
MCHC: 31.9 g/dL (ref 30.0–36.0)
MCV: 81.1 fL (ref 78.0–100.0)
PLATELETS: 78 10*3/uL — AB (ref 150–400)
RBC: 3.71 MIL/uL — ABNORMAL LOW (ref 4.22–5.81)
RDW: 18.9 % — ABNORMAL HIGH (ref 11.5–15.5)
WBC: 9.3 10*3/uL (ref 4.0–10.5)

## 2016-03-09 LAB — LACTIC ACID, PLASMA
LACTIC ACID, VENOUS: 0.5 mmol/L (ref 0.5–2.0)
LACTIC ACID, VENOUS: 0.9 mmol/L (ref 0.5–2.0)

## 2016-03-09 MED ORDER — SODIUM CHLORIDE 0.9 % IV BOLUS (SEPSIS)
1000.0000 mL | Freq: Once | INTRAVENOUS | Status: AC
  Administered 2016-03-09: 1000 mL via INTRAVENOUS

## 2016-03-09 MED ORDER — PIPERACILLIN-TAZOBACTAM 3.375 G IVPB
3.3750 g | Freq: Three times a day (TID) | INTRAVENOUS | Status: DC
  Administered 2016-03-09: 3.375 g via INTRAVENOUS
  Filled 2016-03-09: qty 50

## 2016-03-09 MED ORDER — DEXTROSE 5 % IV SOLN
2.0000 g | INTRAVENOUS | Status: DC
  Administered 2016-03-09 – 2016-03-12 (×4): 2 g via INTRAVENOUS
  Filled 2016-03-09 (×4): qty 2

## 2016-03-09 MED ORDER — SODIUM CHLORIDE 0.9 % IV BOLUS (SEPSIS): 500.0000 mL | Freq: Once | INTRAVENOUS | Status: DC

## 2016-03-09 MED ORDER — FOLIC ACID 1 MG PO TABS
1.0000 mg | ORAL_TABLET | Freq: Every day | ORAL | Status: DC
  Administered 2016-03-09 – 2016-03-12 (×4): 1 mg via ORAL
  Filled 2016-03-09 (×4): qty 1

## 2016-03-09 NOTE — Progress Notes (Signed)
PHARMACY - PHYSICIAN COMMUNICATION CRITICAL VALUE ALERT - BLOOD CULTURE IDENTIFICATION (BCID)  BCID result received:  [ ]  Vancomycin resistant enterococcus (VRE) [ ]  Enterococcus spp (no resistance detected) [ ]  Listeria monocytogenes [ ]  Staphylococcus species (methicillin resistance detected) [ ]  Staphylococcus species (methicllin resistance NOT detected) [ ]  Methicillin-resistant Staphylococcus aureus (MRSA) [ ]  Methicillin-susceptible Staphylococcus aureus (MSSA) [ ]  Streptococcus agalactiae (Group B strep) [ ]  Streptococcus pneumoniae [ ]  Streptococcus pyogenes (Group A strep) [ ]  Streptococcus spp.  [ ]  Acinetobacter baumannii [ ]  Enterobacter cloacae [ ]  Enterobacter cloacae (Carbapenem resistance detected) [ ]  Escherichia coli [ ]  Escherichia coli (Carbapenem resistance detected) [ ]  Haemophilus influenza [ ]  Klebsiella oxytoca [ ]  Klebsiella oxytoca (Carbapenem resistance detected) [X]  Klebsiella pneumoniae [ ]  Klebsiella pneumoniae (Carbapenem resistance detected) [ ]  Neisseria meningitidis [ ]  Proteus spp. [ ]  Proteus spp. (Carbapenem resistance detected) [ ]  Pseudomonas aeruginosa [ ]  Pseudomonas aeruginosa (Carbapenem resistance detected) [ ]  Serratia marcescens [ ]  Candida _______ albicans, glabrata, krusei, parapsilosis, tropicalis (fill in blank with correct species)  Name of physician (or Provider) Contacted:  L. [place]  Changes to prescribed antibiotics required:  Ceftriaxone 2gm iv q24hr D/c zosyn  Jordan Terry 03/09/2016 6:05 AM

## 2016-03-09 NOTE — Clinical Documentation Improvement (Signed)
Internal Medicine  Possible Conditions?   Sepsis - specify causative organism if known  Determine if there is Severe Sepsis (Sepsis with organ dysfunction - specify), Septic Shock if present  Identify etiology of Sepsis - Device, Implant, Graft, Infusion, Abortion  Specify organ dysfunction - Respiratory Failure, Encephalopathy, Acute Kidney Failure, Pneumonia, UTI, Other (specify), Unable to Clinically Determine}  Other  Clinically Undetermined  Document any associated diagnoses/conditions. Please update your documentation within the medical record to reflect your response to this query. Thank you.  Supporting Information: (As per notes) Vitals in the ED: BP 132/74 mmHg  Pulse 116  Temp(Src) 102.4 F (39.1 C) (Oral)  Resp 18  Ht 6\' 2"  (1.88 m)  Wt 170 lb (77.111 kg)  BMI 21.82 kg/m2  SpO2 95% Hematuria, tachycardia, fever and chills  03-09-16 CRITICAL VALUE ALERT :Critical value received: Blood Cultures Anaerobic bottle gram negative rods   "male admitted on 03/08/2016 with bacteremia. Pharmacy has been consulted for zosyn dosing. "  Please exercise your independent, professional judgment when responding. A specific answer is not anticipated or expected.   Thank You, Alessandra Grout, RN, BSN, CCDS,Clinical Documentation Specialist:  [phone]  [phone]=[facility] [place]- Health Information Management

## 2016-03-09 NOTE — Progress Notes (Signed)
Pharmacy Antibiotic Note  Jordan Terry is a 82 y.o. male admitted on 03/08/2016 with bacteremia.  Pharmacy has been consulted for zosyn dosing.  Plan: Zosyn 3.375g IV q8h (4 hour infusion).  Height: 6\' 2"  (188 cm) Weight: 170 lb (77.111 kg) IBW/kg (Calculated) : 82.2  Temp (24hrs), Avg:100.4 F (38 C), Min:98.7 F (37.1 C), Max:102.4 F (39.1 C)   Recent Labs Lab 03/08/16 0853 03/08/16 0903  WBC 6.3  --   CREATININE 1.09  --   LATICACIDVEN  --  1.49    Estimated Creatinine Clearance: 58 mL/min (by C-G formula based on Cr of 1.09).    Allergies  Allergen Reactions  . Benadryl [Diphenhydramine Hcl]     MAKES HIM GO CRAZY  . Hydrocodone Itching    Small amounts are okay    Antimicrobials this admission: Zosyn 5/14 >>    Dose adjustments this admission: -  Microbiology results: 5/14 BCx: Anaerobic bottle gram negative rods 5/14 UCx: P   Thank you for allowing pharmacy to be a part of this patient's care.  Nani Skillern Crowford 03/09/2016 4:35 AM

## 2016-03-09 NOTE — Progress Notes (Signed)
Patient ID: Jordan Terry, male   DOB: [birth year], 82 y.o.   MRN: [id number]    Subjective: Pt with fever last night to 102.  Feeling better this morning.    Objective: Vital signs in last 24 hours: Temp:  [98.1 F (36.7 C)-102.2 F (39 C)] 98.1 F (36.7 C) (05/15 0642) Pulse Rate:  [72-110] 72 (05/15 0642) Resp:  [15-27] 24 (05/15 0508) BP: (79-117)/(41-68) 79/51 mmHg (05/15 0642) SpO2:  [91 %-100 %] 94 % (05/15 0508)  Intake/Output from previous day: 05/14 0701 - 05/15 0700 In: 1950 [P.O.:600; I.V.:1250; IV Piggyback:100] Out: 1775 [Urine:1775] Intake/Output this shift: Total I/O In: 50 [IV Piggyback:50] Out: -   Physical Exam:  General: Alert and oriented Abd: Soft, NT GU: Urine draining clear urine  Lab Results:  Recent Labs  03/08/16 0853 03/09/16 0427  HGB 11.9* 9.6*  HCT 36.8* 30.1*   Lab Results  Component Value Date   WBC 9.3 03/09/2016   HGB 9.6* 03/09/2016   HCT 30.1* 03/09/2016   MCV 81.1 03/09/2016   PLT 78* 03/09/2016     BMET  Recent Labs  03/08/16 0853 03/09/16 0427  NA 136 138  K 3.2* 3.8  CL 102 108  CO2 27 25  GLUCOSE 114* 127*  BUN 17 20  CREATININE 1.09 1.15  CALCIUM 8.7* 7.7*     Studies/Results: Repeat UA concerning for infection.  Urine culture pending.  Blood culture: GNRs  Assessment/Plan: 1) Urethral trauma/urethral stricture: Catheter draining well. Leave in place.  I will give a voiding trial as an outpatient in 1-2 weeks.  2) UTI: Repeat UA from catheterized specimen appears infected and GNRs in blood c/w urinary tract source.  Continue broad spectrum IV therapy and await cultures.   LOS: 1 day   Remie Mathison,LES 03/09/2016, 10:02 AM

## 2016-03-09 NOTE — Progress Notes (Signed)
CRITICAL VALUE ALERT  Critical value received:  Blood Cultures Anaerobic bottle gram negative rods  Date of notification:  03/09/16  Time of notification:  0145  Critical value read back:Yes.    Nurse who received alert:  Ruben Im, RN  MD notified (1st page):  [place]  Time of first page:  0200  MD notified (2nd page):  Time of second page:  Responding MD:  Rachelle Hora  Time MD responded:  0209

## 2016-03-09 NOTE — Progress Notes (Signed)
PROGRESS NOTE    Jordan Terry  [id number] DOB: Jul 08, 1932 DOA: 03/08/2016 PCP: Donnajean Lopes, MD    Brief Narrative:   82 year old with history of prostate cancer, urethral stricture undergoing self catheterizations, admitted to the medicine service on 03/08/2016 presented with complaints of fevers, chills, hematuria. Felt that 2 days ago may have had trauma with self catheterization. He was seen and evaluated by urology, having Foley catheter placed.   Assessment & Plan:   Principal Problem:   Sepsis ([place]) Active Problems:   Fever and chills   Prostate cancer ([facility])   Hematuria   Fever  1.  Urinary tract infection.  -Patient presented with complaints of fevers and chills have a temperature of 102.4 in the emergency department. -Overnight spiked a temperature 102.2. -Blood cultures growing gram-negative rods, 1 out of 2, clear of urinary source as urine cultures are also growing gram-negative rods. -Plan to continue ceftriaxone 2 g IV every 24 hours until ID and susceptibility testing available.  2.  Sepsis -Present on admission, evidenced by a temperature of 102.2, blood cultures growing gram-negative rods, hypotension with blood pressure room 80/41 with source of infection likely urinary source. -He has responded to several boluses of IV fluids. -Blood cultures growing gram-negative rods 1 out of 2.  -Plan to continue IV ceftriaxone   3.  Hematuria.  -Suspect related to catheter trauma. He was seen and evaluated by urology with placement of indwelling Foley catheter. Plan to leave this in place until he is seen in the office by urology.  4.  Anxiety. -Continue Ativan and nortriptyline at bedtime  DVT prophylaxis: SCDs Code Status: Full code Family Communication: Not present Disposition Plan: Blood cultures turning positive, continue IV ceftriaxone   Consultants:   Urology  Antimicrobials:   Ceftriaxone   Subjective: Think she is feeling little better  today, has no complaints.  Objective: Filed Vitals:   03/09/16 0508 03/09/16 0642 03/09/16 1105 03/09/16 1311  BP: 80/41 79/51 103/49 114/61  Pulse: 79 72 68 79  Temp: 98.8 F (37.1 C) 98.1 F (36.7 C)  98.2 F (36.8 C)  TempSrc: Oral Oral  Oral  Resp: 24   20  Height:      Weight:      SpO2: 94%   98%    Intake/Output Summary (Last 24 hours) at 03/09/16 1715 Last data filed at 03/09/16 1317  Gross per 24 hour  Intake   1950 ml  Output   2175 ml  Net   -225 ml   Filed Weights   03/08/16 0853  Weight: 77.111 kg (170 lb)    Examination:  General exam: Appears calm and comfortable, nontoxic-appearing  Respiratory system: Clear to auscultation. Respiratory effort normal. Cardiovascular system: S1 & S2 heard, RRR. No JVD, murmurs, rubs, gallops or clicks. No pedal edema. Gastrointestinal system: Abdomen is nondistended, soft and nontender. No organomegaly or masses felt. Normal bowel sounds heard. Central nervous system: Alert and oriented. No focal neurological deficits. Extremities: Symmetric 5 x 5 power. Skin: No rashes, lesions or ulcers Psychiatry: Judgement and insight appear normal. Mood & affect appropriate.     Data Reviewed: I have personally reviewed following labs and imaging studies  CBC:  Recent Labs Lab 03/08/16 0853 03/09/16 0427  WBC 6.3 9.3  NEUTROABS 6.0  --   HGB 11.9* 9.6*  HCT 36.8* 30.1*  MCV 79.3 81.1  PLT 87* 78*   Basic Metabolic Panel:  Recent Labs Lab 03/08/16 0853 03/09/16 0427  NA 136 138  K 3.2* 3.8  CL 102 108  CO2 27 25  GLUCOSE 114* 127*  BUN 17 20  CREATININE 1.09 1.15  CALCIUM 8.7* 7.7*   GFR: Estimated Creatinine Clearance: 54.9 mL/min (by C-G formula based on Cr of 1.15). Liver Function Tests:  Recent Labs Lab 03/08/16 0853  AST 27  ALT 20  ALKPHOS 56  BILITOT 2.3*  PROT 6.9  ALBUMIN 3.9   No results for input(s): LIPASE, AMYLASE in the last 168 hours. No results for input(s): AMMONIA in the last  168 hours. Coagulation Profile: No results for input(s): INR, PROTIME in the last 168 hours. Cardiac Enzymes: No results for input(s): CKTOTAL, CKMB, CKMBINDEX, TROPONINI in the last 168 hours. BNP (last 3 results) No results for input(s): PROBNP in the last 8760 hours. HbA1C: No results for input(s): HGBA1C in the last 72 hours. CBG: No results for input(s): GLUCAP in the last 168 hours. Lipid Profile: No results for input(s): CHOL, HDL, LDLCALC, TRIG, CHOLHDL, LDLDIRECT in the last 72 hours. Thyroid Function Tests: No results for input(s): TSH, T4TOTAL, FREET4, T3FREE, THYROIDAB in the last 72 hours. Anemia Panel: No results for input(s): VITAMINB12, FOLATE, FERRITIN, TIBC, IRON, RETICCTPCT in the last 72 hours. Sepsis Labs:  Recent Labs Lab 03/08/16 [id number] 03/09/16 0750 03/09/16 1117  LATICACIDVEN 1.49 0.5 0.9    Recent Results (from the past 240 hour(s))  Culture, blood (Routine x 2)     Status: None (Preliminary result)   Collection Time: 03/08/16  8:53 AM  Result Value Ref Range Status   Specimen Description BLOOD LEFT ANTECUBITAL  Final   Special Requests BOTTLES DRAWN AEROBIC AND ANAEROBIC 5 CC EACH  Final   Culture  Setup Time   Final    ANAEROBIC BOTTLE ONLY GRAM NEGATIVE RODS CRITICAL RESULT CALLED TO, READ BACK BY AND VERIFIED WITH: TO DBROWN(RN) BY TCLEVELAND 03/09/2016 AT 1:44AM Organism ID to follow Performed at [facility]    Culture PENDING  Incomplete   Report Status PENDING  Incomplete  Blood Culture ID Panel (Reflexed)     Status: Abnormal   Collection Time: 03/08/16  8:53 AM  Result Value Ref Range Status   Enterococcus species NOT DETECTED NOT DETECTED Final   Vancomycin resistance NOT DETECTED NOT DETECTED Final   Listeria monocytogenes NOT DETECTED NOT DETECTED Final   Staphylococcus species NOT DETECTED NOT DETECTED Final   Staphylococcus aureus NOT DETECTED NOT DETECTED Final   Methicillin resistance NOT DETECTED NOT DETECTED Final    Streptococcus species NOT DETECTED NOT DETECTED Final   Streptococcus agalactiae NOT DETECTED NOT DETECTED Final   Streptococcus pneumoniae NOT DETECTED NOT DETECTED Final   Streptococcus pyogenes NOT DETECTED NOT DETECTED Final   Acinetobacter baumannii NOT DETECTED NOT DETECTED Final   Enterobacteriaceae species DETECTED (A) NOT DETECTED Final    Comment: CRITICAL RESULT CALLED TO, READ BACK BY AND VERIFIED WITH: TO JULIAN GRIMSLEY(PHARD) BY TCLEVELAND 03/09/2016 AT 5:41AM    Enterobacter cloacae complex NOT DETECTED NOT DETECTED Final   Escherichia coli NOT DETECTED NOT DETECTED Final   Klebsiella oxytoca NOT DETECTED NOT DETECTED Final   Klebsiella pneumoniae DETECTED (A) NOT DETECTED Final    Comment: CRITICAL RESULT CALLED TO, READ BACK BY AND VERIFIED WITH: TO JULIAN GRIMSLEY(PHARD) BY TCLEVELAND 03/09/16 AT 5:43AM    Proteus species NOT DETECTED NOT DETECTED Final   Serratia marcescens NOT DETECTED NOT DETECTED Final   Carbapenem resistance NOT DETECTED NOT DETECTED Final   Haemophilus influenzae NOT DETECTED NOT DETECTED Final  Neisseria meningitidis NOT DETECTED NOT DETECTED Final   Pseudomonas aeruginosa NOT DETECTED NOT DETECTED Final   Candida albicans NOT DETECTED NOT DETECTED Final   Candida glabrata NOT DETECTED NOT DETECTED Final   Candida krusei NOT DETECTED NOT DETECTED Final   Candida parapsilosis NOT DETECTED NOT DETECTED Final   Candida tropicalis NOT DETECTED NOT DETECTED Final    Comment: Performed at [facility]  Urine culture     Status: Abnormal (Preliminary result)   Collection Time: 03/08/16  9:15 AM  Result Value Ref Range Status   Specimen Description URINE, CLEAN CATCH  Final   Special Requests NONE  Final   Culture >=100,000 COLONIES/mL GRAM NEGATIVE RODS (A)  Final   Report Status PENDING  Incomplete  Culture, Urine     Status: None   Collection Time: 03/08/16  2:27 PM  Result Value Ref Range Status   Specimen Description URINE,  CATHETERIZED  Final   Special Requests NONE  Final   Culture NO GROWTH Performed at [facility]   Final   Report Status 03/09/2016 FINAL  Final         Radiology Studies: Dg Chest 2 View  03/08/2016  CLINICAL DATA:  Shortness of breath and fever today. History of prostate cancer. EXAM: CHEST  2 VIEW COMPARISON:  08/08/2013 FINDINGS: Lungs are well inflated and otherwise clear. Cardiomediastinal silhouette is within normal. There is mild degenerative change of the spine. IMPRESSION: No active cardiopulmonary disease. Electronically Signed   By: Marin Olp M.D.   On: 03/08/2016 09:20        Scheduled Meds: . cefTRIAXone (ROCEPHIN)  IV  2 g Intravenous Q24H  . folic acid  1 mg Oral Daily  . gabapentin  200 mg Oral TID  . LORazepam  1 mg Oral QHS  . nortriptyline  30 mg Oral QHS  . sodium chloride  500 mL Intravenous Once  . sodium chloride flush  3 mL Intravenous Q12H   Continuous Infusions: . sodium chloride 75 mL/hr at 03/09/16 0654     LOS: 1 day    Time spent: 35 minutes    Kelvin Cellar, MD [facility] Pager [phone] If 7PM-7AM, please contact night-coverage [email or web address] Password [email or web address] 03/09/2016, 5:15 PM

## 2016-03-10 LAB — BASIC METABOLIC PANEL
ANION GAP: 5 (ref 5–15)
BUN: 15 mg/dL (ref 6–20)
CALCIUM: 7.7 mg/dL — AB (ref 8.9–10.3)
CHLORIDE: 105 mmol/L (ref 101–111)
CO2: 24 mmol/L (ref 22–32)
Creatinine, Ser: 1.07 mg/dL (ref 0.61–1.24)
GFR calc Af Amer: >60 mL/min (ref >60)
GFR calc non Af Amer: >60 mL/min (ref >60)
Glucose, Bld: 106 mg/dL — ABNORMAL HIGH (ref 65–99)
Potassium: 3.7 mmol/L (ref 3.5–5.1)
SODIUM: 134 mmol/L — AB (ref 135–145)

## 2016-03-10 LAB — CBC
HCT: 29.4 % — ABNORMAL LOW (ref 39.0–52.0)
HEMOGLOBIN: 9.4 g/dL — AB (ref 13.0–17.0)
MCH: 26 pg (ref 26.0–34.0)
MCHC: 32 g/dL (ref 30.0–36.0)
MCV: 81.2 fL (ref 78.0–100.0)
Platelets: 64 10*3/uL — ABNORMAL LOW (ref 150–400)
RBC: 3.62 MIL/uL — AB (ref 4.22–5.81)
RDW: 19.2 % — ABNORMAL HIGH (ref 11.5–15.5)
WBC: 7.1 10*3/uL (ref 4.0–10.5)

## 2016-03-10 MED ORDER — PREDNISONE 20 MG PO TABS: 60.0000 mg | ORAL_TABLET | Freq: Every day | ORAL | Status: DC

## 2016-03-10 MED ORDER — IBUPROFEN 200 MG PO TABS
400.0000 mg | ORAL_TABLET | Freq: Once | ORAL | Status: AC
  Administered 2016-03-10: 400 mg via ORAL
  Filled 2016-03-10: qty 2

## 2016-03-10 NOTE — Progress Notes (Signed)
PROGRESS NOTE    Jordan Terry  [id number] DOB: 01-15-1932 DOA: 03/08/2016 PCP: Donnajean Lopes, MD    Brief Narrative:   82 year old with history of prostate cancer, urethral stricture undergoing self catheterizations, admitted to the medicine service on 03/08/2016 presented with complaints of fevers, chills, hematuria. Felt that 2 days ago may have had trauma with self catheterization. He was seen and evaluated by urology, having Foley catheter placed. Blood cultures positive for Klebsiella pneumoniae, urine cultures growing gram-negative rods. Suspect this will come back positive for Klebsiella as well.     Assessment & Plan:   Principal Problem:   Sepsis ([place]) Active Problems:   Fever and chills   Prostate cancer (H[facility])   Hematuria   Fever  1.  Urinary tract infection.  -Patient presented with complaints of fevers and chills have a temperature of 102.4 in the emergency department. -Overnight spiked a temperature 102.2. -Blood cultures growing gram-negative rods, 1 out of 2, clear of urinary source as urine cultures are also growing gram-negative rods, positive for Klebsiella pneumoniae -Plan to continue ceftriaxone 2 g IV every 24 hours until ID and susceptibility testing available.  2.  Severe Sepsis -Present on admission, evidenced by a temperature of 102.2, blood cultures growing gram-negative rods, hypotension with blood pressure's 80/41 with source of infection likely urinary tract. -He has responded to several boluses of IV fluids -Blood cultures growing gram-negative rods 1 out of 2. I spoke with microbiology organism growing is Klebsiella pneumoniae. Susceptibility testing is in progress.  -Will continue IV ceftriaxone until susceptibility testing is available -From a clinical standpoint patient showing improvement  3.  Hematuria.  -Suspect related to catheter trauma. He was seen and evaluated by urology with placement of indwelling Foley catheter. Plan to leave  this in place until he is seen in the office by urology.  4.  Anxiety. -Continue Ativan and nortriptyline at bedtime  DVT prophylaxis: SCDs Code Status: Full code Family Communication: Not present Disposition Plan: Showing clinical improvement, continue IV antibiotic therapy until susceptibility testing is available   Consultants:   Urology, Dr. Alinda Money  Antimicrobials:   Ceftriaxone  Subjective: He was ambulated down the hallway today, did well with a walker. States feeling much better today. Denies fevers or chills  Objective: Filed Vitals:   03/09/16 1105 03/09/16 1311 03/09/16 2032 03/10/16 0531  BP: 103/49 114/61 128/77 107/68  Pulse: 68 79 85 77  Temp:  98.2 F (36.8 C) 98.6 F (37 C) 98.7 F (37.1 C)  TempSrc:  Oral Oral Oral  Resp:  20 18 18   Height:      Weight:      SpO2:  98% 100% 95%    Intake/Output Summary (Last 24 hours) at 03/10/16 0817 Last data filed at 03/10/16 0715  Gross per 24 hour  Intake 2558.75 ml  Output   2350 ml  Net 208.75 ml   Filed Weights   03/08/16 0853  Weight: 77.111 kg (170 lb)    Examination:  General exam: Appears calm and comfortable, nontoxic-appearing  Respiratory system: Clear to auscultation. Respiratory effort normal. Cardiovascular system: S1 & S2 heard, RRR. No JVD, murmurs, rubs, gallops or clicks. No pedal edema. Gastrointestinal system: Abdomen is nondistended, soft and nontender. No organomegaly or masses felt. Normal bowel sounds heard. Central nervous system: Alert and oriented. No focal neurological deficits. Extremities: Symmetric 5 x 5 power. Skin: No rashes, lesions or ulcers Psychiatry: Judgement and insight appear normal. Mood & affect appropriate.  Data Reviewed: I have personally reviewed following labs and imaging studies  CBC:  Recent Labs Lab 03/08/16 0853 03/09/16 0427 03/10/16 0443  WBC 6.3 9.3 7.1  NEUTROABS 6.0  --   --   HGB 11.9* 9.6* 9.4*  HCT 36.8* 30.1* 29.4*  MCV 79.3  81.1 81.2  PLT 87* 78* 64*   Basic Metabolic Panel:  Recent Labs Lab 03/08/16 0853 03/09/16 0427 03/10/16 0443  NA 136 138 134*  K 3.2* 3.8 3.7  CL 102 108 105  CO2 27 25 24   GLUCOSE 114* 127* 106*  BUN 17 20 15   CREATININE 1.09 1.15 1.07  CALCIUM 8.7* 7.7* 7.7*   GFR: Estimated Creatinine Clearance: 59 mL/min (by C-G formula based on Cr of 1.07). Liver Function Tests:  Recent Labs Lab 03/08/16 0853  AST 27  ALT 20  ALKPHOS 56  BILITOT 2.3*  PROT 6.9  ALBUMIN 3.9   No results for input(s): LIPASE, AMYLASE in the last 168 hours. No results for input(s): AMMONIA in the last 168 hours. Coagulation Profile: No results for input(s): INR, PROTIME in the last 168 hours. Cardiac Enzymes: No results for input(s): CKTOTAL, CKMB, CKMBINDEX, TROPONINI in the last 168 hours. BNP (last 3 results) No results for input(s): PROBNP in the last 8760 hours. HbA1C: No results for input(s): HGBA1C in the last 72 hours. CBG: No results for input(s): GLUCAP in the last 168 hours. Lipid Profile: No results for input(s): CHOL, HDL, LDLCALC, TRIG, CHOLHDL, LDLDIRECT in the last 72 hours. Thyroid Function Tests: No results for input(s): TSH, T4TOTAL, FREET4, T3FREE, THYROIDAB in the last 72 hours. Anemia Panel: No results for input(s): VITAMINB12, FOLATE, FERRITIN, TIBC, IRON, RETICCTPCT in the last 72 hours. Sepsis Labs:  Recent Labs Lab 03/08/16 [id number] 03/09/16 0750 03/09/16 1117  LATICACIDVEN 1.49 0.5 0.9    Recent Results (from the past 240 hour(s))  Culture, blood (Routine x 2)     Status: None (Preliminary result)   Collection Time: 03/08/16  8:53 AM  Result Value Ref Range Status   Specimen Description BLOOD LEFT ANTECUBITAL  Final   Special Requests BOTTLES DRAWN AEROBIC AND ANAEROBIC 5 CC EACH  Final   Culture  Setup Time   Final    ANAEROBIC BOTTLE ONLY GRAM NEGATIVE RODS CRITICAL RESULT CALLED TO, READ BACK BY AND VERIFIED WITH: TO DBROWN(RN) BY TCLEVELAND 03/09/2016  AT 1:44AM Organism ID to follow Performed at [facility]    Culture PENDING  Incomplete   Report Status PENDING  Incomplete  Blood Culture ID Panel (Reflexed)     Status: Abnormal   Collection Time: 03/08/16  8:53 AM  Result Value Ref Range Status   Enterococcus species NOT DETECTED NOT DETECTED Final   Vancomycin resistance NOT DETECTED NOT DETECTED Final   Listeria monocytogenes NOT DETECTED NOT DETECTED Final   Staphylococcus species NOT DETECTED NOT DETECTED Final   Staphylococcus aureus NOT DETECTED NOT DETECTED Final   Methicillin resistance NOT DETECTED NOT DETECTED Final   Streptococcus species NOT DETECTED NOT DETECTED Final   Streptococcus agalactiae NOT DETECTED NOT DETECTED Final   Streptococcus pneumoniae NOT DETECTED NOT DETECTED Final   Streptococcus pyogenes NOT DETECTED NOT DETECTED Final   Acinetobacter baumannii NOT DETECTED NOT DETECTED Final   Enterobacteriaceae species DETECTED (A) NOT DETECTED Final    Comment: CRITICAL RESULT CALLED TO, READ BACK BY AND VERIFIED WITH: TO JULIAN GRIMSLEY(PHARD) BY TCLEVELAND 03/09/2016 AT 5:41AM    Enterobacter cloacae complex NOT DETECTED NOT DETECTED Final  Escherichia coli NOT DETECTED NOT DETECTED Final   Klebsiella oxytoca NOT DETECTED NOT DETECTED Final   Klebsiella pneumoniae DETECTED (A) NOT DETECTED Final    Comment: CRITICAL RESULT CALLED TO, READ BACK BY AND VERIFIED WITH: TO JULIAN GRIMSLEY(PHARD) BY TCLEVELAND 03/09/16 AT 5:43AM    Proteus species NOT DETECTED NOT DETECTED Final   Serratia marcescens NOT DETECTED NOT DETECTED Final   Carbapenem resistance NOT DETECTED NOT DETECTED Final   Haemophilus influenzae NOT DETECTED NOT DETECTED Final   Neisseria meningitidis NOT DETECTED NOT DETECTED Final   Pseudomonas aeruginosa NOT DETECTED NOT DETECTED Final   Candida albicans NOT DETECTED NOT DETECTED Final   Candida glabrata NOT DETECTED NOT DETECTED Final   Candida krusei NOT DETECTED NOT DETECTED Final    Candida parapsilosis NOT DETECTED NOT DETECTED Final   Candida tropicalis NOT DETECTED NOT DETECTED Final    Comment: Performed at [facility]  Urine culture     Status: Abnormal (Preliminary result)   Collection Time: 03/08/16  9:15 AM  Result Value Ref Range Status   Specimen Description URINE, CLEAN CATCH  Final   Special Requests NONE  Final   Culture >=100,000 COLONIES/mL GRAM NEGATIVE RODS (A)  Final   Report Status PENDING  Incomplete  Culture, Urine     Status: None   Collection Time: 03/08/16  2:27 PM  Result Value Ref Range Status   Specimen Description URINE, CATHETERIZED  Final   Special Requests NONE  Final   Culture NO GROWTH Performed at [facility]   Final   Report Status 03/09/2016 FINAL  Final         Radiology Studies: Dg Chest 2 View  03/08/2016  CLINICAL DATA:  Shortness of breath and fever today. History of prostate cancer. EXAM: CHEST  2 VIEW COMPARISON:  08/08/2013 FINDINGS: Lungs are well inflated and otherwise clear. Cardiomediastinal silhouette is within normal. There is mild degenerative change of the spine. IMPRESSION: No active cardiopulmonary disease. Electronically Signed   By: Marin Olp M.D.   On: 03/08/2016 09:20        Scheduled Meds: . cefTRIAXone (ROCEPHIN)  IV  2 g Intravenous Q24H  . folic acid  1 mg Oral Daily  . gabapentin  200 mg Oral TID  . LORazepam  1 mg Oral QHS  . nortriptyline  30 mg Oral QHS  . sodium chloride  500 mL Intravenous Once  . sodium chloride flush  3 mL Intravenous Q12H   Continuous Infusions: . sodium chloride 75 mL/hr at 03/09/16 2117     LOS: 2 days    Time spent: 25 minutes    Kelvin Cellar, MD [facility] Pager [phone] If 7PM-7AM, please contact night-coverage [email or web address] Password [facility] 03/10/2016, 8:17 AM

## 2016-03-10 NOTE — Progress Notes (Signed)
Patient ID: Jordan Terry, male   DOB: [birth year], 82 y.o.   MRN: [id number]    Subjective: Pt clinically improving. No fevers last 24 hrs.  Objective: Vital signs in last 24 hours: Temp:  [98.6 F (37 C)-98.7 F (37.1 C)] 98.7 F (37.1 C) (05/16 0531) Pulse Rate:  [77-85] 77 (05/16 0531) Resp:  [18] 18 (05/16 0531) BP: (107-128)/(68-77) 107/68 mmHg (05/16 0531) SpO2:  [95 %-100 %] 95 % (05/16 0531)  Intake/Output from previous day: 05/15 0701 - 05/16 0700 In: 2530 [P.O.:660; I.V.:1770; IV Piggyback:100] Out: 2350 [Urine:2350] Intake/Output this shift: Total I/O In: 78.8 [P.O.:30; I.V.:48.8] Out: 400 [Urine:400]  Physical Exam:  General: Alert and oriented GU: Urine clear  Lab Results:  Recent Labs  03/08/16 0853 03/09/16 0427 03/10/16 0443  HGB 11.9* 9.6* 9.4*  HCT 36.8* 30.1* 29.4*   BMET  Recent Labs  03/09/16 0427 03/10/16 0443  NA 138 134*  K 3.8 3.7  CL 108 105  CO2 25 24  GLUCOSE 127* 106*  BUN 20 15  CREATININE 1.15 1.07  CALCIUM 7.7* 7.7*     Studies/Results: Urine culture: Klebsiella Blood culture: Klebsiella  Assessment/Plan: 1) Urethral stricture/catheter trauma: Discharge with catheter. I will arrange voiding trial for 1-2 weeks.  2)Febrile UTI: Culture specific antibiotics for 10-14 days.   LOS: 2 days   Brevin Mcfadden,LES 03/10/2016, 1:14 PM

## 2016-03-11 DIAGNOSIS — R7881 Bacteremia: Secondary | ICD-10-CM

## 2016-03-11 DIAGNOSIS — R319 Hematuria, unspecified: Secondary | ICD-10-CM

## 2016-03-11 DIAGNOSIS — D899 Disorder involving the immune mechanism, unspecified: Secondary | ICD-10-CM

## 2016-03-11 DIAGNOSIS — A419 Sepsis, unspecified organism: Secondary | ICD-10-CM

## 2016-03-11 LAB — COMPREHENSIVE METABOLIC PANEL
ALBUMIN: 3.2 g/dL — AB (ref 3.5–5.0)
ALT: 25 U/L (ref 17–63)
ANION GAP: 5 (ref 5–15)
AST: 20 U/L (ref 15–41)
Alkaline Phosphatase: 61 U/L (ref 38–126)
BUN: 12 mg/dL (ref 6–20)
CHLORIDE: 104 mmol/L (ref 101–111)
CO2: 28 mmol/L (ref 22–32)
Calcium: 8.1 mg/dL — ABNORMAL LOW (ref 8.9–10.3)
Creatinine, Ser: 1 mg/dL (ref 0.61–1.24)
GFR calc Af Amer: >60 mL/min (ref >60)
GFR calc non Af Amer: >60 mL/min (ref >60)
GLUCOSE: 100 mg/dL — AB (ref 65–99)
POTASSIUM: 3.9 mmol/L (ref 3.5–5.1)
SODIUM: 137 mmol/L (ref 135–145)
Total Bilirubin: 0.5 mg/dL (ref 0.3–1.2)
Total Protein: 6.1 g/dL — ABNORMAL LOW (ref 6.5–8.1)

## 2016-03-11 LAB — URINE CULTURE: Culture: >=100000 — AB

## 2016-03-11 LAB — CBC
HEMATOCRIT: 30.5 % — AB (ref 39.0–52.0)
Hemoglobin: 9.8 g/dL — ABNORMAL LOW (ref 13.0–17.0)
MCH: 25.9 pg — ABNORMAL LOW (ref 26.0–34.0)
MCHC: 32.1 g/dL (ref 30.0–36.0)
MCV: 80.7 fL (ref 78.0–100.0)
PLATELETS: 82 10*3/uL — AB (ref 150–400)
RBC: 3.78 MIL/uL — ABNORMAL LOW (ref 4.22–5.81)
RDW: 18.9 % — AB (ref 11.5–15.5)
WBC: 5.6 10*3/uL (ref 4.0–10.5)

## 2016-03-11 LAB — MAGNESIUM: Magnesium: 1.9 mg/dL (ref 1.7–2.4)

## 2016-03-11 LAB — CULTURE, BLOOD (ROUTINE X 2)

## 2016-03-11 NOTE — Care Management Important Message (Signed)
Important Message  Patient Details  Name: Jordan Terry MRN: [id number] Date of Birth: 1932/03/18   Medicare Important Message Given:  Yes    Camillo Flaming 03/11/2016, 9:10 AMImportant Message  Patient Details  Name: Jordan Terry MRN: [id number] Date of Birth: [birth year]   Medicare Important Message Given:  Yes    Camillo Flaming 03/11/2016, 9:10 AM

## 2016-03-11 NOTE — Progress Notes (Signed)
PROGRESS NOTE    [name]  [id number] DOB: 1932-01-10 DOA: 03/08/2016 PCP: Donnajean Lopes, MD    Brief Narrative:   82 year old with history of prostate cancer, urethral stricture undergoing self catheterizations, admitted to the medicine service on 03/08/2016 presented with complaints of fevers, chills, hematuria. Felt that 2 days ago may have had trauma with self catheterization. He was seen and evaluated by urology, having Foley catheter placed. Blood cultures positive for Klebsiella pneumoniae, urine cultures  positive for Klebsiella as well.     Assessment & Plan:   Principal Problem:   Sepsis ([place]) Active Problems:   Prostate cancer ([place])   Hematuria   Fever and chills   Fever  1.  complicated Urinary tract infection/bacteremia -Patient presented with complaints of fevers and chills have a temperature of 102.4 in the emergency department. -Blood cultures  1 out of 2+ for klebsiella pneumoniae  urine cultures positive for Klebsiella pneumoniae and ecoli -continue ceftriaxone 2 g IV which is susceptible, repeat blood culture  2.  Severe Sepsis -Present on admission, evidenced by a temperature of 102.2, hypotension with blood pressure's 80/41 with source of infection likely urinary tract/bacteremia. -He has responded to several boluses of IV fluids -Will continue IV ceftriaxone , repeat blood culture -From a clinical standpoint patient showing improvement, d/c ivf  3.  Hematuria. H/o in and out cath for the last several months. -Suspect related to catheter trauma. He was seen and evaluated by urology with placement of indwelling Foley catheter. Plan to leave this in place until he is seen in the office by urology.  4.  Anxiety. -Continue Ativan and nortriptyline at bedtime  5. Immunosuppressed state: on methotrexate chronically  DVT prophylaxis: SCDs Code Status: Full code Family Communication: Not present Disposition Plan:  i have reviewed tele strips,  has been sinus rhythm, now that clinically improving, will d/c tele, start ambulate continue IV antibiotic therapy until repeat blood culture no growth, then transition to oral  And discharge.   Consultants:   Urology, Dr. Alinda Money  Antimicrobials:   Ceftriaxone  Subjective: Sitting in chair, NAD, denies pain currently, reported diarrhea, no fever, no ab pain, no n/v, foley in place  Objective: Filed Vitals:   03/10/16 1401 03/10/16 2105 03/11/16 0509 03/11/16 1353  BP: 114/72 127/67 134/70 129/65  Pulse: 85 78 84 81  Temp: 98.8 F (37.1 C) 99.2 F (37.3 C) 98.5 F (36.9 C)   TempSrc: Oral Oral Oral   Resp: 18 18 18 20   Height:      Weight:      SpO2: 98% 98% 98% 97%    Intake/Output Summary (Last 24 hours) at 03/11/16 1821 Last data filed at 03/11/16 1742  Gross per 24 hour  Intake 2061.25 ml  Output   4990 ml  Net -2928.75 ml   Filed Weights   03/08/16 0853  Weight: 77.111 kg (170 lb)    Examination:  General exam: Appears calm and comfortable, nontoxic-appearing  Respiratory system: Clear to auscultation. Respiratory effort normal. Cardiovascular system: S1 & S2 heard, RRR. No JVD, murmurs, rubs, gallops or clicks. No pedal edema. Gastrointestinal system: Abdomen is nondistended, soft and nontender. No organomegaly or masses felt. Normal bowel sounds heard. Central nervous system: Alert and oriented. No focal neurological deficits. Extremities: Symmetric 5 x 5 power. Skin: No rashes, lesions or ulcers Psychiatry: Judgement and insight appear normal. Mood & affect appropriate.     Data Reviewed: I have personally reviewed following labs and imaging studies  CBC:  Recent Labs Lab 03/08/16 0853 03/09/16 0427 03/10/16 0443 03/11/16 0830  WBC 6.3 9.3 7.1 5.6  NEUTROABS 6.0  --   --   --   HGB 11.9* 9.6* 9.4* 9.8*  HCT 36.8* 30.1* 29.4* 30.5*  MCV 79.3 81.1 81.2 80.7  PLT 87* 78* 64* 82*   Basic Metabolic Panel:  Recent Labs Lab 03/08/16 0853  03/09/16 0427 03/10/16 0443 03/11/16 0830  NA 136 138 134* 137  K 3.2* 3.8 3.7 3.9  CL 102 108 105 104  CO2 27 25 24 28   GLUCOSE 114* 127* 106* 100*  BUN 17 20 15 12   CREATININE 1.09 1.15 1.07 1.00  CALCIUM 8.7* 7.7* 7.7* 8.1*  MG  --   --   --  1.9   GFR: Estimated Creatinine Clearance: 63.2 mL/min (by C-G formula based on Cr of 1). Liver Function Tests:  Recent Labs Lab 03/08/16 0853 03/11/16 0830  AST 27 20  ALT 20 25  ALKPHOS 56 61  BILITOT 2.3* 0.5  PROT 6.9 6.1*  ALBUMIN 3.9 3.2*   No results for input(s): LIPASE, AMYLASE in the last 168 hours. No results for input(s): AMMONIA in the last 168 hours. Coagulation Profile: No results for input(s): INR, PROTIME in the last 168 hours. Cardiac Enzymes: No results for input(s): CKTOTAL, CKMB, CKMBINDEX, TROPONINI in the last 168 hours. BNP (last 3 results) No results for input(s): PROBNP in the last 8760 hours. HbA1C: No results for input(s): HGBA1C in the last 72 hours. CBG: No results for input(s): GLUCAP in the last 168 hours. Lipid Profile: No results for input(s): CHOL, HDL, LDLCALC, TRIG, CHOLHDL, LDLDIRECT in the last 72 hours. Thyroid Function Tests: No results for input(s): TSH, T4TOTAL, FREET4, T3FREE, THYROIDAB in the last 72 hours. Anemia Panel: No results for input(s): VITAMINB12, FOLATE, FERRITIN, TIBC, IRON, RETICCTPCT in the last 72 hours. Sepsis Labs:  Recent Labs Lab 03/08/16 [id number] 03/09/16 0750 03/09/16 1117  LATICACIDVEN 1.49 0.5 0.9    Recent Results (from the past 240 hour(s))  Culture, blood (Routine x 2)     Status: None (Preliminary result)   Collection Time: 03/08/16  8:52 AM  Result Value Ref Range Status   Specimen Description BLOOD BLOOD RIGHT FOREARM  Final   Special Requests BOTTLES DRAWN AEROBIC AND ANAEROBIC 5CC EACH  Final   Culture   Final    NO GROWTH 3 DAYS Performed at [facility]    Report Status PENDING  Incomplete  Culture, blood (Routine x 2)      Status: Abnormal   Collection Time: 03/08/16  8:53 AM  Result Value Ref Range Status   Specimen Description BLOOD LEFT ANTECUBITAL  Final   Special Requests BOTTLES DRAWN AEROBIC AND ANAEROBIC 5 CC EACH  Final   Culture  Setup Time   Final    ANAEROBIC BOTTLE ONLY GRAM NEGATIVE RODS CRITICAL RESULT CALLED TO, READ BACK BY AND VERIFIED WITH: TO DBROWN(RN) BY TCLEVELAND 03/09/2016 AT 1:44AM Organism ID to follow Performed at [place] (A)  Final   Report Status 03/11/2016 FINAL  Final   Organism ID, Bacteria KLEBSIELLA PNEUMONIAE  Final      Susceptibility   Klebsiella pneumoniae - MIC*    AMPICILLIN >=32 RESISTANT Resistant     CEFAZOLIN <=4 SENSITIVE Sensitive     CEFEPIME <=1 SENSITIVE Sensitive     CEFTAZIDIME <=1 SENSITIVE Sensitive     CEFTRIAXONE <=1 SENSITIVE Sensitive     CIPROFLOXACIN <=  0.25 SENSITIVE Sensitive     GENTAMICIN <=1 SENSITIVE Sensitive     IMIPENEM <=0.25 SENSITIVE Sensitive     TRIMETH/SULFA <=20 SENSITIVE Sensitive     AMPICILLIN/SULBACTAM 4 SENSITIVE Sensitive     PIP/TAZO <=4 SENSITIVE Sensitive     * KLEBSIELLA PNEUMONIAE  Blood Culture ID Panel (Reflexed)     Status: Abnormal   Collection Time: 03/08/16  8:53 AM  Result Value Ref Range Status   Enterococcus species NOT DETECTED NOT DETECTED Final   Vancomycin resistance NOT DETECTED NOT DETECTED Final   Listeria monocytogenes NOT DETECTED NOT DETECTED Final   Staphylococcus species NOT DETECTED NOT DETECTED Final   Staphylococcus aureus NOT DETECTED NOT DETECTED Final   Methicillin resistance NOT DETECTED NOT DETECTED Final   Streptococcus species NOT DETECTED NOT DETECTED Final   Streptococcus agalactiae NOT DETECTED NOT DETECTED Final   Streptococcus pneumoniae NOT DETECTED NOT DETECTED Final   Streptococcus pyogenes NOT DETECTED NOT DETECTED Final   Acinetobacter baumannii NOT DETECTED NOT DETECTED Final   Enterobacteriaceae species DETECTED (A) NOT  DETECTED Final    Comment: CRITICAL RESULT CALLED TO, READ BACK BY AND VERIFIED WITH: TO JULIAN GRIMSLEY(PHARD) BY TCLEVELAND 03/09/2016 AT 5:41AM    Enterobacter cloacae complex NOT DETECTED NOT DETECTED Final   Escherichia coli NOT DETECTED NOT DETECTED Final   Klebsiella oxytoca NOT DETECTED NOT DETECTED Final   Klebsiella pneumoniae DETECTED (A) NOT DETECTED Final    Comment: CRITICAL RESULT CALLED TO, READ BACK BY AND VERIFIED WITH: TO JULIAN GRIMSLEY(PHARD) BY TCLEVELAND 03/09/16 AT 5:43AM    Proteus species NOT DETECTED NOT DETECTED Final   Serratia marcescens NOT DETECTED NOT DETECTED Final   Carbapenem resistance NOT DETECTED NOT DETECTED Final   Haemophilus influenzae NOT DETECTED NOT DETECTED Final   Neisseria meningitidis NOT DETECTED NOT DETECTED Final   Pseudomonas aeruginosa NOT DETECTED NOT DETECTED Final   Candida albicans NOT DETECTED NOT DETECTED Final   Candida glabrata NOT DETECTED NOT DETECTED Final   Candida krusei NOT DETECTED NOT DETECTED Final   Candida parapsilosis NOT DETECTED NOT DETECTED Final   Candida tropicalis NOT DETECTED NOT DETECTED Final    Comment: Performed at [facility]  Urine culture     Status: Abnormal   Collection Time: 03/08/16  9:15 AM  Result Value Ref Range Status   Specimen Description URINE, CLEAN CATCH  Final   Special Requests NONE  Final   Culture (A)  Final    >=100,000 COLONIES/mL KLEBSIELLA PNEUMONIAE 50,000 COLONIES/mL ESCHERICHIA COLI    Report Status 03/11/2016 FINAL  Final   Organism ID, Bacteria KLEBSIELLA PNEUMONIAE (A)  Final   Organism ID, Bacteria ESCHERICHIA COLI (A)  Final      Susceptibility   Escherichia coli - MIC*    AMPICILLIN <=2 SENSITIVE Sensitive     CEFAZOLIN <=4 SENSITIVE Sensitive     CEFTRIAXONE <=1 SENSITIVE Sensitive     CIPROFLOXACIN <=0.25 SENSITIVE Sensitive     GENTAMICIN <=1 SENSITIVE Sensitive     IMIPENEM <=0.25 SENSITIVE Sensitive     NITROFURANTOIN <=16 SENSITIVE Sensitive      TRIMETH/SULFA <=20 SENSITIVE Sensitive     AMPICILLIN/SULBACTAM <=2 SENSITIVE Sensitive     PIP/TAZO <=4 SENSITIVE Sensitive     * 50,000 COLONIES/mL ESCHERICHIA COLI   Klebsiella pneumoniae - MIC*    AMPICILLIN >=32 RESISTANT Resistant     CEFAZOLIN <=4 SENSITIVE Sensitive     CEFTRIAXONE <=1 SENSITIVE Sensitive     CIPROFLOXACIN <=0.25 SENSITIVE Sensitive  GENTAMICIN <=1 SENSITIVE Sensitive     IMIPENEM <=0.25 SENSITIVE Sensitive     NITROFURANTOIN 64 INTERMEDIATE Intermediate     TRIMETH/SULFA <=20 SENSITIVE Sensitive     AMPICILLIN/SULBACTAM 4 SENSITIVE Sensitive     PIP/TAZO <=4 SENSITIVE Sensitive     * >=100,000 COLONIES/mL KLEBSIELLA PNEUMONIAE  Culture, Urine     Status: None   Collection Time: 03/08/16  2:27 PM  Result Value Ref Range Status   Specimen Description URINE, CATHETERIZED  Final   Special Requests NONE  Final   Culture NO GROWTH Performed at [facility]   Final   Report Status 03/09/2016 FINAL  Final         Radiology Studies: No results found.      Scheduled Meds: . cefTRIAXone (ROCEPHIN)  IV  2 g Intravenous Q24H  . folic acid  1 mg Oral Daily  . gabapentin  200 mg Oral TID  . LORazepam  1 mg Oral QHS  . nortriptyline  30 mg Oral QHS  . sodium chloride  500 mL Intravenous Once  . sodium chloride flush  3 mL Intravenous Q12H   Continuous Infusions:     LOS: 3 days    Time spent: 25 minutes    Jordan Keats, MD PhD [facility] Pager [phone] If 7PM-7AM, please contact night-coverage [email or web address] Password [facility] 03/11/2016, 6:21 PM

## 2016-03-11 NOTE — Progress Notes (Signed)
Patient ID: Jordan Terry, male   DOB: June 19, 1932, 82 y.o.   MRN: [id number]    Subjective: Pt without new complaints.  Objective: Vital signs in last 24 hours: Temp:  [98.5 F (36.9 C)-99.2 F (37.3 C)] 98.5 F (36.9 C) (05/17 0509) Pulse Rate:  [78-85] 84 (05/17 0509) Resp:  [18] 18 (05/17 0509) BP: (114-134)/(67-72) 134/70 mmHg (05/17 0509) SpO2:  [98 %] 98 % (05/17 0509)  Intake/Output from previous day: 05/16 0701 - 05/17 0700 In: 2140 [P.O.:270; I.V.:1820; IV Piggyback:50] Out: [id number] [Urine:3990] Intake/Output this shift:    Physical Exam:  General: Alert and oriented GU: Urine clear  Lab Results:  Recent Labs  03/08/16 0853 03/09/16 0427 03/10/16 0443  HGB 11.9* 9.6* 9.4*  HCT 36.8* 30.1* 29.4*   BMET  Recent Labs  03/09/16 0427 03/10/16 0443  NA 138 134*  K 3.8 3.7  CL 108 105  CO2 25 24  GLUCOSE 127* 106*  BUN 20 15  CREATININE 1.15 1.07  CALCIUM 7.7* 7.7*     Studies/Results: Urine culture: Klebsiella   Assessment/Plan: 1) Urinary retention: Discharge with catheter. I will arrange follow up for voiding trial in our office in 1-2 weeks.  2) Febrile UTI: Culture specific antibiotics for 2 weeks.  Will sign off. Please call if further questions.   LOS: 3 days   Shondra Capps,LES 03/11/2016, 7:19 AM

## 2016-03-11 NOTE — Evaluation (Signed)
Physical Therapy Evaluation Patient Details Name: Jordan Terry MRN: [id number] DOB: 06-05-1932 Today's Date: 03/11/2016   History of Present Illness  82 year old with history of prostate cancer, urethral stricture undergoing self catheterizations, admitted to the medicine service on 03/08/2016 presented with complaints of fevers, chills, hematuria. Felt that he may have had trauma with self catheterization. . Admitted with UTI and sepsis. foley placed.  Clinical Impression  The patient is pleasant and able to ambulate today. Pt admitted with above diagnosis. Pt currently with functional limitations due to the deficits listed below (see PT Problem List).  Pt will benefit from skilled PT to increase their independence and safety with mobility to allow discharge to the venue listed below.       Follow Up Recommendations No PT follow up;Supervision - Intermittent    Equipment Recommendations  None recommended by PT    Recommendations for Other Services       Precautions / Restrictions Precautions Precautions: Fall      Mobility  Bed Mobility Overal bed mobility: Independent                Transfers Overall transfer level: Independent                  Ambulation/Gait Ambulation/Gait assistance: Supervision Ambulation Distance (Feet): 220 Feet Assistive device: Rolling walker (2 wheeled) Gait Pattern/deviations: Step-through pattern     General Gait Details: cues for staying inside the RW.  Stairs            Wheelchair Mobility    Modified Rankin (Stroke Patients Only)       Balance                                             Pertinent Vitals/Pain Pain Assessment: No/denies pain    Home Living Family/patient expects to be discharged to:: Private residence Living Arrangements: Spouse/significant other Available Help at Discharge: Family Type of Home: House Home Access: Stairs to enter Entrance Stairs-Rails:  None Technical brewer of Steps: 1 Home Layout: One level Home Equipment: [place] - single point      Prior Function Level of Independence: Independent         Comments: walks 3 miles 3 times aweek     Hand Dominance        Extremity/Trunk Assessment   Upper Extremity Assessment: Overall WFL for tasks assessed           Lower Extremity Assessment: Overall WFL for tasks assessed      Cervical / Trunk Assessment: Kyphotic  Communication   Communication: No difficulties;HOH  Cognition Arousal/Alertness: Awake/alert Behavior During Therapy: WFL for tasks assessed/performed Overall Cognitive Status: Within Functional Limits for tasks assessed                      General Comments      Exercises        Assessment/Plan    PT Assessment Patient needs continued PT services  PT Diagnosis Difficulty walking   PT Problem List Decreased strength;Decreased activity tolerance;Decreased balance;Decreased mobility  PT Treatment Interventions DME instruction;Gait training;Functional mobility training;Therapeutic activities;Therapeutic exercise;Patient/family education   PT Goals (Current goals can be found in the Care Plan section) Acute Rehab PT Goals Patient Stated Goal: to walk more PT Goal Formulation: With patient Time For Goal Achievement: 03/25/16 Potential to Achieve Goals: Good  Frequency Min 3X/week   Barriers to discharge        Co-evaluation               End of Session Equipment Utilized During Treatment: Gait belt Activity Tolerance: Patient tolerated treatment well Patient left: in chair;with call bell/phone within reach;with chair alarm set Nurse Communication: Mobility status         Time: 1354-1410 PT Time Calculation (min) (ACUTE ONLY): 16 min   Charges:   PT Evaluation $PT Eval Low Complexity: 1 Procedure     PT G CodesClaretha Cooper 03/11/2016, 3:40 PM  Tresa Endo PT [phone]

## 2016-03-12 DIAGNOSIS — N390 Urinary tract infection, site not specified: Secondary | ICD-10-CM

## 2016-03-12 DIAGNOSIS — Z87448 Personal history of other diseases of urinary system: Secondary | ICD-10-CM

## 2016-03-12 LAB — CBC
HCT: 30.2 % — ABNORMAL LOW (ref 39.0–52.0)
HEMOGLOBIN: 9.8 g/dL — AB (ref 13.0–17.0)
MCH: 26 pg (ref 26.0–34.0)
MCHC: 32.5 g/dL (ref 30.0–36.0)
MCV: 80.1 fL (ref 78.0–100.0)
Platelets: 101 10*3/uL — ABNORMAL LOW (ref 150–400)
RBC: 3.77 MIL/uL — ABNORMAL LOW (ref 4.22–5.81)
RDW: 18.6 % — ABNORMAL HIGH (ref 11.5–15.5)
WBC: 5.3 10*3/uL (ref 4.0–10.5)

## 2016-03-12 LAB — BASIC METABOLIC PANEL
Anion gap: 5 (ref 5–15)
BUN: 15 mg/dL (ref 6–20)
CHLORIDE: 107 mmol/L (ref 101–111)
CO2: 27 mmol/L (ref 22–32)
CREATININE: 0.93 mg/dL (ref 0.61–1.24)
Calcium: 8.3 mg/dL — ABNORMAL LOW (ref 8.9–10.3)
GFR calc Af Amer: >60 mL/min (ref >60)
GFR calc non Af Amer: >60 mL/min (ref >60)
GLUCOSE: 106 mg/dL — AB (ref 65–99)
POTASSIUM: 3.8 mmol/L (ref 3.5–5.1)
SODIUM: 139 mmol/L (ref 135–145)

## 2016-03-12 LAB — MAGNESIUM: MAGNESIUM: 2.1 mg/dL (ref 1.7–2.4)

## 2016-03-12 MED ORDER — CEPHALEXIN 500 MG PO CAPS: 500.0000 mg | ORAL_CAPSULE | Freq: Two times a day (BID) | ORAL | Status: DC

## 2016-03-12 NOTE — Progress Notes (Signed)
Went over all discharge information with patient and family.  All questions answered.  Discharge packet given to patient.  Went over foley care with patient and taught correct way to change leg bag.  Pt demonstrated teach back.  Wheeled patient out.

## 2016-03-12 NOTE — Discharge Summary (Signed)
Discharge Summary  Jordan Terry [id number] DOB: Jan 22, 1932  PCP: Donnajean Lopes, MD  Admit date: 03/08/2016 Discharge date: 03/12/2016  Time spent: <66mins  Recommendations for Outpatient Follow-up:  1. F/u with PMD within a week  for hospital discharge follow up, repeat cbc/bmp at follow up, pmd to follow up on repeat blood culture final result 2. F/u with urology in 1-2 weeks for voiding trial  Discharge Diagnoses:  Active Hospital Problems   Diagnosis Date Noted  . Sepsis ([place]) 03/09/2016  . Prostate cancer ([place]) 03/08/2016  . Hematuria 03/08/2016  . Fever and chills 03/08/2016  . Fever 03/08/2016    Resolved Hospital Problems   Diagnosis Date Noted Date Resolved  No resolved problems to display.    Discharge Condition: stable  Diet recommendation: heart healthy/carb modified  Filed Weights   03/08/16 0853  Weight: 77.111 kg (170 lb)    History of present illness:  Chief Complaint: Fevers, chills, hematuria  HPI: Jordan Terry is a 82 y.o. male with medical history significant of prostate cancer, urethral stricture undergoing self catheterizations, currently follows Dr. Alinda Money of urology undergoing cystoscopy with balloon dilatation of urethral stricture on 05/07/2014, presents to the emergency department with complaints of fevers. He states that 2 days ago had trauma with self-catheterization after which he started noting hematuria which has remained fairly constant. Then yesterday he developed a low-grade temperature of 100. He spoke with his urologist Dr. Alinda Money who recommended that he present to the emergency room for further workup and evaluation. On presentation he was found to have a temperature of 102.4 with a pulse of 120. He denies shortness of breath, cough, sputum production, chest pain, diarrhea, purulence or foul-smelling urine.  ED Course: In the emergency room found to have a temperature 102.4, he was given a dose of IV Zosyn. Lab work revealed  a white count of 6300 with a lactic acid of 1.49. Urinalysis showed a few bacteria with small amount of leukocytes and negative for nitrates.  Hospital Course:  Principal Problem:   Sepsis ([place]) Active Problems:   Prostate cancer ([place])   Hematuria   Fever and chills   Fever  1. complicated Urinary tract infection/bacteremia -Patient presented with complaints of fevers and chills have a temperature of 102.4 in the emergency department. -Blood cultures 1 out of 2+ for klebsiella pneumoniae urine cultures positive for Klebsiella pneumoniae and ecoli -ceftriaxone 2 g IV which is susceptible, repeat blood culture no growth at discharge -he is discharged with 9 days of oral keflex to finish total of 14day abx treatment.  2. Severe Sepsis -Present on admission, evidenced by a temperature of 102.2, hypotension with blood pressure's 80/41 with source of infection likely urinary tract/bacteremia. -He has responded to several boluses of IV fluids -recieved IV ceftriaxone , repeat blood culture no growth -he is discharged with 9 days of oral keflex to finish total of 14day abx treatment.  3. Hematuria. Likely traumatic due to self catheterization   H/o in and out cath for the last several months due to h/o urethral stricture. -Suspect related to catheter trauma. He was seen and evaluated by urology with placement of indwelling Foley catheter. Plan to leave this in place until he is seen in the office by urology.  4. Anxiety. -Continue Ativan and nortriptyline at bedtime  5. Immunosuppressed state: on methotrexate chronically  Code Status: Full code Family Communication: Not present Disposition Plan: d/c home on 5/18   Consultants:   Urology, Dr. Alinda Money  Antimicrobials:  Ceftriaxone  Discharge Exam: BP 143/76 mmHg  Pulse 70  Temp(Src) 98.2 F (36.8 C) (Oral)  Resp 18  Ht 6\' 2"  (1.88 m)  Wt 77.111 kg (170 lb)  BMI 21.82 kg/m2  SpO2 97%  General exam: Appears calm  and comfortable, nontoxic-appearing , indwelling foley with clear urine Respiratory system: Clear to auscultation. Respiratory effort normal. Cardiovascular system: S1 & S2 heard, RRR. No JVD, murmurs, rubs, gallops or clicks. No pedal edema. Gastrointestinal system: Abdomen is nondistended, soft and nontender. No organomegaly or masses felt. Normal bowel sounds heard. Central nervous system: Alert and oriented. No focal neurological deficits. Extremities: Symmetric 5 x 5 power. Skin: No rashes, lesions or ulcers Psychiatry: Judgement and insight appear normal. Mood & affect appropriate.    Discharge Instructions You were cared for by a hospitalist during your hospital stay. If you have any questions about your discharge medications or the care you received while you were in the hospital after you are discharged, you can call the unit and asked to speak with the hospitalist on call if the hospitalist that took care of you is not available. Once you are discharged, your primary care physician will handle any further medical issues. Please note that NO REFILLS for any discharge medications will be authorized once you are discharged, as it is imperative that you return to your primary care physician (or establish a relationship with a primary care physician if you do not have one) for your aftercare needs so that they can reassess your need for medications and monitor your lab values.      Discharge Instructions    Diet - low sodium heart healthy    Complete by:  As directed      Increase activity slowly    Complete by:  As directed             Medication List    TAKE these medications        cephALEXin 500 MG capsule  Commonly known as:  KEFLEX  Take 1 capsule (500 mg total) by mouth 2 (two) times daily.     folic acid 1 MG tablet  Commonly known as:  FOLVITE  Take 1 mg by mouth daily. Six days a week. Pt takes every day but on sat.     gabapentin 100 MG capsule  Commonly known  as:  NEURONTIN  Take 200 mg by mouth 3 (three) times daily.     LORazepam 0.5 MG tablet  Commonly known as:  ATIVAN  Take 1 mg by mouth at bedtime.     methotrexate 2.5 MG tablet  Take 12.5 mg by mouth every Saturday.     nortriptyline 10 MG capsule  Commonly known as:  PAMELOR  Take 1 tab mid afternoon and 3 at hs     oxyCODONE-acetaminophen 7.5-325 MG tablet  Commonly known as:  PERCOCET  Take 0.5-1 tablets by mouth every 8 (eight) hours as needed (pain.).     temazepam 15 MG capsule  Commonly known as:  RESTORIL  Take 15 mg by mouth at bedtime.     traMADol 50 MG tablet  Commonly known as:  ULTRAM  Take 50 mg by mouth every 6 (six) hours as needed for moderate pain.     triamcinolone cream 0.1 %  Commonly known as:  KENALOG  Apply 1 application topically every morning. APPLY TO CHEST AND BACK EVERY MORNING FOR GROVER DISEASE       Allergies  Allergen Reactions  . Benadryl [  Diphenhydramine Hcl]     MAKES HIM GO CRAZY  . Hydrocodone Itching    Small amounts are okay   Follow-up Information    Follow up with Donnajean Lopes, MD In 1 week.   Specialty:  Internal Medicine   Why:  hospital discharge follow up, repeat cbc/bmp at follow up   Contact information:   [place] [address] [phone]       Follow up with Dutch Gray, MD In 2 weeks.   Specialty:  Urology   Why:  hospital discharge folow up, voiding trial   Contact information:   [place] [address] [phone]        The results of significant diagnostics from this hospitalization (including imaging, microbiology, ancillary and laboratory) are listed below for reference.    Significant Diagnostic Studies: Dg Chest 2 View  03/08/2016  CLINICAL DATA:  Shortness of breath and fever today. History of prostate cancer. EXAM: CHEST  2 VIEW COMPARISON:  08/08/2013 FINDINGS: Lungs are well inflated and otherwise clear. Cardiomediastinal silhouette is within normal.  There is mild degenerative change of the spine. IMPRESSION: No active cardiopulmonary disease. Electronically Signed   By: Marin Olp M.D.   On: 03/08/2016 09:20    Microbiology: Recent Results (from the past 240 hour(s))  Culture, blood (Routine x 2)     Status: None (Preliminary result)   Collection Time: 03/08/16  8:52 AM  Result Value Ref Range Status   Specimen Description BLOOD BLOOD RIGHT FOREARM  Final   Special Requests BOTTLES DRAWN AEROBIC AND ANAEROBIC 5CC EACH  Final   Culture   Final    NO GROWTH 3 DAYS Performed at [facility]    Report Status PENDING  Incomplete  Culture, blood (Routine x 2)     Status: Abnormal   Collection Time: 03/08/16  8:53 AM  Result Value Ref Range Status   Specimen Description BLOOD LEFT ANTECUBITAL  Final   Special Requests BOTTLES DRAWN AEROBIC AND ANAEROBIC 5 CC EACH  Final   Culture  Setup Time   Final    ANAEROBIC BOTTLE ONLY GRAM NEGATIVE RODS CRITICAL RESULT CALLED TO, READ BACK BY AND VERIFIED WITH: TO DBROWN(RN) BY TCLEVELAND 03/09/2016 AT 1:44AM Organism ID to follow Performed at [facility] (A)  Final   Report Status 03/11/2016 FINAL  Final   Organism ID, Bacteria KLEBSIELLA PNEUMONIAE  Final      Susceptibility   Klebsiella pneumoniae - MIC*    AMPICILLIN >=32 RESISTANT Resistant     CEFAZOLIN <=4 SENSITIVE Sensitive     CEFEPIME <=1 SENSITIVE Sensitive     CEFTAZIDIME <=1 SENSITIVE Sensitive     CEFTRIAXONE <=1 SENSITIVE Sensitive     CIPROFLOXACIN <=0.25 SENSITIVE Sensitive     GENTAMICIN <=1 SENSITIVE Sensitive     IMIPENEM <=0.25 SENSITIVE Sensitive     TRIMETH/SULFA <=20 SENSITIVE Sensitive     AMPICILLIN/SULBACTAM 4 SENSITIVE Sensitive     PIP/TAZO <=4 SENSITIVE Sensitive     * KLEBSIELLA PNEUMONIAE  Blood Culture ID Panel (Reflexed)     Status: Abnormal   Collection Time: 03/08/16  8:53 AM  Result Value Ref Range Status   Enterococcus species NOT DETECTED NOT  DETECTED Final   Vancomycin resistance NOT DETECTED NOT DETECTED Final   Listeria monocytogenes NOT DETECTED NOT DETECTED Final   Staphylococcus species NOT DETECTED NOT DETECTED Final   Staphylococcus aureus NOT DETECTED NOT DETECTED Final  Methicillin resistance NOT DETECTED NOT DETECTED Final   Streptococcus species NOT DETECTED NOT DETECTED Final   Streptococcus agalactiae NOT DETECTED NOT DETECTED Final   Streptococcus pneumoniae NOT DETECTED NOT DETECTED Final   Streptococcus pyogenes NOT DETECTED NOT DETECTED Final   Acinetobacter baumannii NOT DETECTED NOT DETECTED Final   Enterobacteriaceae species DETECTED (A) NOT DETECTED Final    Comment: CRITICAL RESULT CALLED TO, READ BACK BY AND VERIFIED WITH: TO JULIAN GRIMSLEY(PHARD) BY TCLEVELAND 03/09/2016 AT 5:41AM    Enterobacter cloacae complex NOT DETECTED NOT DETECTED Final   Escherichia coli NOT DETECTED NOT DETECTED Final   Klebsiella oxytoca NOT DETECTED NOT DETECTED Final   Klebsiella pneumoniae DETECTED (A) NOT DETECTED Final    Comment: CRITICAL RESULT CALLED TO, READ BACK BY AND VERIFIED WITH: TO JULIAN GRIMSLEY(PHARD) BY TCLEVELAND 03/09/16 AT 5:43AM    Proteus species NOT DETECTED NOT DETECTED Final   Serratia marcescens NOT DETECTED NOT DETECTED Final   Carbapenem resistance NOT DETECTED NOT DETECTED Final   Haemophilus influenzae NOT DETECTED NOT DETECTED Final   Neisseria meningitidis NOT DETECTED NOT DETECTED Final   Pseudomonas aeruginosa NOT DETECTED NOT DETECTED Final   Candida albicans NOT DETECTED NOT DETECTED Final   Candida glabrata NOT DETECTED NOT DETECTED Final   Candida krusei NOT DETECTED NOT DETECTED Final   Candida parapsilosis NOT DETECTED NOT DETECTED Final   Candida tropicalis NOT DETECTED NOT DETECTED Final    Comment: Performed at [facility]  Urine culture     Status: Abnormal   Collection Time: 03/08/16  9:15 AM  Result Value Ref Range Status   Specimen Description URINE, CLEAN  CATCH  Final   Special Requests NONE  Final   Culture (A)  Final    >=100,000 COLONIES/mL KLEBSIELLA PNEUMONIAE 50,000 COLONIES/mL ESCHERICHIA COLI    Report Status 03/11/2016 FINAL  Final   Organism ID, Bacteria KLEBSIELLA PNEUMONIAE (A)  Final   Organism ID, Bacteria ESCHERICHIA COLI (A)  Final      Susceptibility   Escherichia coli - MIC*    AMPICILLIN <=2 SENSITIVE Sensitive     CEFAZOLIN <=4 SENSITIVE Sensitive     CEFTRIAXONE <=1 SENSITIVE Sensitive     CIPROFLOXACIN <=0.25 SENSITIVE Sensitive     GENTAMICIN <=1 SENSITIVE Sensitive     IMIPENEM <=0.25 SENSITIVE Sensitive     NITROFURANTOIN <=16 SENSITIVE Sensitive     TRIMETH/SULFA <=20 SENSITIVE Sensitive     AMPICILLIN/SULBACTAM <=2 SENSITIVE Sensitive     PIP/TAZO <=4 SENSITIVE Sensitive     * 50,000 COLONIES/mL ESCHERICHIA COLI   Klebsiella pneumoniae - MIC*    AMPICILLIN >=32 RESISTANT Resistant     CEFAZOLIN <=4 SENSITIVE Sensitive     CEFTRIAXONE <=1 SENSITIVE Sensitive     CIPROFLOXACIN <=0.25 SENSITIVE Sensitive     GENTAMICIN <=1 SENSITIVE Sensitive     IMIPENEM <=0.25 SENSITIVE Sensitive     NITROFURANTOIN 64 INTERMEDIATE Intermediate     TRIMETH/SULFA <=20 SENSITIVE Sensitive     AMPICILLIN/SULBACTAM 4 SENSITIVE Sensitive     PIP/TAZO <=4 SENSITIVE Sensitive     * >=100,000 COLONIES/mL KLEBSIELLA PNEUMONIAE  Culture, Urine     Status: None   Collection Time: 03/08/16  2:27 PM  Result Value Ref Range Status   Specimen Description URINE, CATHETERIZED  Final   Special Requests NONE  Final   Culture NO GROWTH Performed at [facility]   Final   Report Status 03/09/2016 FINAL  Final     Labs: Basic Metabolic Panel:  Recent Labs Lab 03/08/16 0853 03/09/16 0427 03/10/16 0443 03/11/16 0830 03/12/16 0454  NA 136 138 134* 137 139  K 3.2* 3.8 3.7 3.9 3.8  CL 102 108 105 104 107  CO2 27 25 24 28 27   GLUCOSE 114* 127* 106* 100* 106*  BUN 17 20 15 12 15   CREATININE 1.09 1.15 1.07 1.00 0.93    CALCIUM 8.7* 7.7* 7.7* 8.1* 8.3*  MG  --   --   --  1.9 2.1   Liver Function Tests:  Recent Labs Lab 03/08/16 0853 03/11/16 0830  AST 27 20  ALT 20 25  ALKPHOS 56 61  BILITOT 2.3* 0.5  PROT 6.9 6.1*  ALBUMIN 3.9 3.2*   No results for input(s): LIPASE, AMYLASE in the last 168 hours. No results for input(s): AMMONIA in the last 168 hours. CBC:  Recent Labs Lab 03/08/16 0853 03/09/16 0427 03/10/16 0443 03/11/16 0830 03/12/16 0454  WBC 6.3 9.3 7.1 5.6 5.3  NEUTROABS 6.0  --   --   --   --   HGB 11.9* 9.6* 9.4* 9.8* 9.8*  HCT 36.8* 30.1* 29.4* 30.5* 30.2*  MCV 79.3 81.1 81.2 80.7 80.1  PLT 87* 78* 64* 82* 101*   Cardiac Enzymes: No results for input(s): CKTOTAL, CKMB, CKMBINDEX, TROPONINI in the last 168 hours. BNP: BNP (last 3 results) No results for input(s): BNP in the last 8760 hours.  ProBNP (last 3 results) No results for input(s): PROBNP in the last 8760 hours.  CBG: No results for input(s): GLUCAP in the last 168 hours.     SignedFlorencia Reasons MD, PhD  [facility] 03/12/2016, 11:16 AM

## 2016-03-13 LAB — CULTURE, BLOOD (ROUTINE X 2): CULTURE: NO GROWTH

## 2016-03-16 LAB — CULTURE, BLOOD (ROUTINE X 2)
CULTURE: NO GROWTH
CULTURE: NO GROWTH

## 2016-07-21 ENCOUNTER — Encounter: Payer: Self-pay | Admitting: Neurology

## 2016-07-21 ENCOUNTER — Ambulatory Visit (INDEPENDENT_AMBULATORY_CARE_PROVIDER_SITE_OTHER): Payer: Medicare Other | Admitting: Neurology

## 2016-07-21 VITALS — BP 128/76 | HR 76 | Ht 74.0 in | Wt 170.0 lb

## 2016-07-21 DIAGNOSIS — R42 Dizziness and giddiness: Secondary | ICD-10-CM

## 2016-07-21 DIAGNOSIS — R269 Unspecified abnormalities of gait and mobility: Secondary | ICD-10-CM | POA: Diagnosis not present

## 2016-07-21 MED ORDER — NORTRIPTYLINE HCL 50 MG PO CAPS: 50.0000 mg | ORAL_CAPSULE | Freq: Every day | ORAL | 5 refills | Status: DC

## 2016-07-21 NOTE — Progress Notes (Signed)
Reason for visit: Dizziness  Jordan Terry is an 82 y.o. male  History of present illness:  Jordan Terry is an 82 year old right-handed white male with a history of chronic dizziness. The patient indicates that the dizziness is a daily feature, generally is present throughout the day, he indicates that he has minimal problems when he is sitting or lying down, he mainly is dizzy when he is standing. The patient in general feels better in the morning, worse as the day goes on. He denies any overt headaches. He is on nortriptyline that does not help the dizziness but his wife claims that it makes him less irritable. The patient has trouble sleeping and the nortriptyline does help him sleep some. The patient is on gabapentin taking 200 mg 3 times daily, he does not believe that this medication resulted in any dizziness problems. He returns to the office today for an evaluation.  Past Medical History:  Diagnosis Date  . Anginal pain ([place]) 20 years ago  . Anxiety   . Arthritis   . Bilateral tinnitus    wears hearing aides  . Cancer [facility])    prostate  . Grover's disease   . Headache(784.0)    not since retired  . Renal disorder   . Vertigo     Past Surgical History:  Procedure Laterality Date  . CARDIAC CATHETERIZATION  "20 years ago"  . CARPAL TUNNEL RELEASE Left 2008  . CATARACT EXTRACTION Bilateral 2014  . CYSTOSCOPY WITH URETHRAL DILATATION N/A 08/14/2013   Procedure: CYSTOSCOPY WITH URETHRAL DILATATION BALLOON DILATION OF URETHRAL STRICTURE;  Surgeon: Dutch Gray, MD;  Location: [facility];  Service: Urology;  Laterality: N/A;  BALLOON DILATION   . CYSTOSCOPY WITH URETHRAL DILATATION N/A 05/07/2014   Procedure: CYSTOSCOPY WITH BALLOON DILATION OF URETHRAL STRICTURE;  Surgeon: Raynelle Bring, MD;  Location: [facility];  Service: Urology;  Laterality: N/A;  [place]   "scope"  . NOSE SURGERY  1991, 1998  . RADIOACTIVE SEED IMPLANT   2002   "for prostate cancer"  . SHOULDER SURGERY  2012   right  . THYROIDECTOMY, PARTIAL  1974  . VASECTOMY  1970    Family History  Problem Relation Age of Onset  . Anxiety disorder Mother   . Cancer Father     colon, lung  . Diabetes Brother   . Cancer Brother   . Diabetes Sister   . Leukemia Sister   . Cancer Sister     breast  . Cancer Sister     breast  . Cancer Sister     thyroid  . Diabetes Sister     Social history:  reports that he has never smoked. He has never used smokeless tobacco. He reports that he does not drink alcohol or use drugs.    Allergies  Allergen Reactions  . Benadryl [Diphenhydramine Hcl]     MAKES HIM GO CRAZY  . Hydrocodone Itching    Small amounts are okay    Medications:  Prior to Admission medications   Medication Sig Start Date End Date Taking? Authorizing Provider  folic acid (FOLVITE) 1 MG tablet Take 1 mg by mouth daily. Six days a week. Pt takes every day but on sat.   Yes Historical Provider, MD  gabapentin (NEURONTIN) 100 MG capsule Take 200 mg by mouth 3 (three) times daily.    Yes Historical Provider, MD  LORazepam (ATIVAN) 0.5 MG  tablet Take 1 mg by mouth at bedtime.   Yes Historical Provider, MD  methotrexate 2.5 MG tablet Take 12.5 mg by mouth every Saturday.    Yes Historical Provider, MD  nortriptyline (PAMELOR) 10 MG capsule Take 1 tab mid afternoon and 3 at hs Patient taking differently: Take 30 mg by mouth at bedtime.  01/14/16  Yes Kathrynn Ducking, MD  oxyCODONE-acetaminophen (PERCOCET) 7.5-325 MG per tablet Take 0.5-1 tablets by mouth every 8 (eight) hours as needed (pain.).    Yes Historical Provider, MD  temazepam (RESTORIL) 15 MG capsule Take 15 mg by mouth at bedtime.   Yes Historical Provider, MD  traMADol (ULTRAM) 50 MG tablet Take 50 mg by mouth every 6 (six) hours as needed for moderate pain.    Yes Historical Provider, MD  triamcinolone cream (KENALOG) 0.1 % Apply 1 application topically every morning. APPLY  TO CHEST AND BACK EVERY MORNING FOR [place] DISEASE   Yes Historical Provider, MD    ROS:  Out of a complete 14 system review of symptoms, the patient complains only of the following symptoms, and all other reviewed systems are negative.  Fatigue Ringing in the ears Blurred vision Frequent waking Difficulty urinating Joint pain, achy muscles Skin rash, itching Dizziness Agitation, anxiety  Blood pressure 128/76, pulse 76, height 6\' 2"  (1.88 m), weight 170 lb (77.1 kg).   Blood pressure, right arm, sitting is 144/68. Blood pressure, right arm, standing is 146/66.  Physical Exam  General: The patient is alert and cooperative at the time of the examination.  Skin: No significant peripheral edema is noted.   Neurologic Exam  Mental status: The patient is alert and oriented x 3 at the time of the examination. The patient has apparent normal recent and remote memory, with an apparently normal attention span and concentration ability.   Cranial nerves: Facial symmetry is present. Speech is normal, no aphasia or dysarthria is noted. Extraocular movements are full. Visual fields are full.  Motor: The patient has good strength in all 4 extremities.  Sensory examination: Soft touch sensation is symmetric on the face, arms, and legs.  Coordination: The patient has good finger-nose-finger and heel-to-shin bilaterally.  Gait and station: The patient has a normal gait. Tandem gait is minimally unsteady. Romberg is negative. No drift is seen.  Reflexes: Deep tendon reflexes are symmetric.   Assessment/Plan:  1. Chronic subjective dizziness  2. Anxiety and depression  The patient continues to have ongoing symptoms of dizziness that have not been improved with medication. The patient feels worse when he is standing, better while sitting or lying down. He is not orthostatic today. We will go up on the nortriptyline as this appears to help his mood. This may also help some of his  chronic insomnia issues. We will place him on 50 mg at night. He will follow-up in 6 months.  Jill Alexanders MD 07/21/2016 3:42 PM  [facility] [address] [place], [place] [address]  Phone [phone] Fax [phone]

## 2017-01-19 ENCOUNTER — Ambulatory Visit (INDEPENDENT_AMBULATORY_CARE_PROVIDER_SITE_OTHER): Payer: Medicare Other | Admitting: Nurse Practitioner

## 2017-01-19 ENCOUNTER — Encounter: Payer: Self-pay | Admitting: Nurse Practitioner

## 2017-01-19 ENCOUNTER — Encounter (INDEPENDENT_AMBULATORY_CARE_PROVIDER_SITE_OTHER): Payer: Self-pay

## 2017-01-19 VITALS — BP 150/87 | HR 95 | Ht 74.0 in | Wt 174.4 lb

## 2017-01-19 DIAGNOSIS — R269 Unspecified abnormalities of gait and mobility: Secondary | ICD-10-CM | POA: Diagnosis not present

## 2017-01-19 DIAGNOSIS — R42 Dizziness and giddiness: Secondary | ICD-10-CM

## 2017-01-19 MED ORDER — NORTRIPTYLINE HCL 25 MG PO CAPS: 25.0000 mg | ORAL_CAPSULE | Freq: Every day | ORAL | 1 refills | Status: AC

## 2017-01-19 NOTE — Patient Instructions (Addendum)
Decrease Nortriptyline to 25 mg at bedtime Be careful  With ambulation at risk for falls F/U in 6 months Dizziness Dizziness is a common problem. It makes you feel unsteady or light-headed. You may feel like you are about to pass out (faint). Dizziness can lead to getting hurt if you stumble or fall. Dizziness can be caused by many things, including:  Medicines.  Not having enough water in your body (dehydration).  Illness. Follow these instructions at home: Eating and drinking   Drink enough fluid to keep your pee (urine) clear or pale yellow. This helps to keep you from getting dehydrated. Try to drink more clear fluids, such as water.  Do not drink alcohol.  Limit how much caffeine you drink or eat, if your doctor tells you to do that.  Limit how much salt (sodium) you drink or eat, if your doctor tells you to do that. Activity   Avoid making quick movements.  When you stand up from sitting in a chair, steady yourself until you feel okay.  In the morning, first sit up on the side of the bed. When you feel okay, stand slowly while you hold onto something. Do this until you know that your balance is fine.  If you need to stand in one place for a long time, move your legs often. Tighten and relax the muscles in your legs while you are standing.  Do not drive or use heavy machinery if you feel dizzy.  Avoid bending down if you feel dizzy. Place items in your home so you can reach them easily without leaning over. Lifestyle   Do not use any products that contain nicotine or tobacco, such as cigarettes and e-cigarettes. If you need help quitting, ask your doctor.  Try to lower your stress level. You can do this by using methods such as yoga or meditation. Talk with your doctor if you need help. General instructions   Watch your dizziness for any changes.  Take over-the-counter and prescription medicines only as told by your doctor. Talk with your doctor if you think that you  are dizzy because of a medicine that you are taking.  Tell a friend or a family member that you are feeling dizzy. If he or she notices any changes in your behavior, have this person call your doctor.  Keep all follow-up visits as told by your doctor. This is important. Contact a doctor if:  Your dizziness does not go away.  Your dizziness or light-headedness gets worse.  You feel sick to your stomach (nauseous).  You have trouble hearing.  You have new symptoms.  You are unsteady on your feet.  You feel like the room is spinning. Get help right away if:  You throw up (vomit) or have watery poop (diarrhea), and you cannot eat or drink anything.  You have trouble:  Talking.  Walking.  Swallowing.  Using your arms, hands, or legs.  You feel generally weak.  You are not thinking clearly, or you have trouble forming sentences. A friend or family member may notice this.  You have:  Chest pain.  Pain in your belly (abdomen).  Shortness of breath.  Sweating.  Your vision changes.  You are bleeding.  You have a very bad headache.  You have neck pain or a stiff neck.  You have a fever. These symptoms may be an emergency. Do not wait to see if the symptoms will go away. Get medical help right away. Call your local emergency  services (911 in the [place] Do not drive yourself to the hospital. Summary  Dizziness makes you feel unsteady or light-headed. You may feel like you are about to pass out (faint).  Drink enough fluid to keep your pee (urine) clear or pale yellow. Do not drink alcohol.  Avoid making quick movements if you feel dizzy.  Watch your dizziness for any changes. This information is not intended to replace advice given to you by your health care provider. Make sure you discuss any questions you have with your health care provider. Document Released: 10/01/2011 Document Revised: 10/29/2016 Document Reviewed: 10/29/2016 Elsevier Interactive Patient  Education  2017 [employer].

## 2017-01-19 NOTE — Progress Notes (Signed)
[facility]  PATIENT: Jordan Terry DOB: 12/24/1931   REASON FOR VISIT: Follow-up for history of dizziness and gait disorder HISTORY FROM: Patient alone at visit.    HISTORY OF PRESENT ILLNESS:UPDATE 01/19/2017 [name] is an 83 year old right-handed white male with a history of chronic dizziness. The patient indicates that the dizziness is a daily feature, generally is present throughout the day, he indicates that he has minimal problems when he is sitting or lying down, he mainly is dizzy when he is standing. He also has a history of anxiety disorder which is followed by Dr. Judithann Terry the patient takes Ativan for this. He is followed for [facility] by a dermatologist at [facility]. He claims his itching from this has gotten worse The patient in general feels better in the morning, worse as the day goes on. He denies any overt headaches. He is on nortriptyline that does not help the dizziness, which was increased at his last visit however the patient is wanting to titrate down again on this medication and maybe eventually get off. He has tried this before with little block The patient has trouble sleeping and the nortriptyline does help him sleep. The patient is on gabapentin taking 200 mg 3 times daily, he does not believe that this medication resulted in any dizziness problems. He returns to the office today for an evaluation. He denies any falls due to his dizziness, he has not had any difficulty driving.   REVIEW OF SYSTEMS: Full 14 system review of systems performed and notable only for those listed, all others are neg:  Constitutional: Fatigue  Cardiovascular: neg Ear/Nose/Throat: Hearing loss Skin: neg Eyes: neg Respiratory: neg Gastroitestinal: neg  Hematology/Lymphatic: neg  Endocrine: neg Musculoskeletal:neg Allergy/Immunology: neg Neurological: Dizziness Psychiatric: Anxiety Sleep : neg   ALLERGIES: Allergies  Allergen Reactions  . Benadryl  [Diphenhydramine Hcl]     MAKES HIM GO CRAZY  . Hydrocodone Itching    Small amounts are okay    HOME MEDICATIONS: Outpatient Medications Prior to Visit  Medication Sig Dispense Refill  . folic acid (FOLVITE) 1 MG tablet Take 1 mg by mouth daily. Six days a week. Pt takes every day but on sat.    . gabapentin (Jordan [facility]) 100 MG capsule Take 200 mg by mouth 3 (three) times daily.     Marland Kitchen LORazepam (ATIVAN) 0.5 MG tablet Take 1 mg by mouth at bedtime.    . methotrexate 2.5 MG tablet Take 12.5 mg by mouth every Saturday.     . nortriptyline (Jordan [facility]) 50 MG capsule Take 1 capsule (50 mg total) by mouth at bedtime. 30 capsule 5  . oxyCODONE-acetaminophen (Jordan Terry) 7.5-325 MG per tablet Take 0.5-1 tablets by mouth every 8 (eight) hours as needed (pain.).     Marland Kitchen temazepam (Jordan [facility]) 15 MG capsule Take 15 mg by mouth at bedtime.    . traMADol (ULTRAM) 50 MG tablet Take 50 mg by mouth every 6 (six) hours as needed for moderate pain.     Marland Kitchen triamcinolone cream ([facility]) 0.1 % Apply 1 application topically every morning. APPLY TO CHEST AND BACK EVERY MORNING FOR GROVER Terry     No facility-administered medications prior to visit.     PAST MEDICAL HISTORY: Past Medical History:  Diagnosis Date  . Anginal pain ([facility]) 20 years ago  . Anxiety   . Arthritis   . Bilateral tinnitus    wears hearing aides  . Cancer [facility])    prostate  . [facility]   .  Headache(784.0)    not since retired  . Renal disorder   . Vertigo     PAST SURGICAL HISTORY: Past Surgical History:  Procedure Laterality Date  . CARDIAC CATHETERIZATION  "20 years ago"  . CARPAL TUNNEL RELEASE Left 2008  . CATARACT EXTRACTION Bilateral 2014  . CYSTOSCOPY WITH URETHRAL DILATATION N/A 08/14/2013   Procedure: CYSTOSCOPY WITH URETHRAL DILATATION BALLOON DILATION OF URETHRAL STRICTURE;  Surgeon: Jordan Gray, MD;  Location: [facility];  Service: Urology;  Laterality: N/A;  BALLOON DILATION   . CYSTOSCOPY WITH URETHRAL DILATATION  N/A 05/07/2014   Procedure: CYSTOSCOPY WITH BALLOON DILATION OF URETHRAL STRICTURE;  Surgeon: Jordan Bring, MD;  Location: [facility];  Service: Urology;  Laterality: N/A;  [place]   "scope"  . NOSE SURGERY  1991, 1998  . RADIOACTIVE SEED IMPLANT  2002   "for prostate cancer"  . SHOULDER SURGERY  2012   right  . THYROIDECTOMY, PARTIAL  1974  . VASECTOMY  1970    FAMILY HISTORY: Family History  Problem Relation Age of Onset  . Anxiety disorder Mother   . Cancer Father     colon, lung  . Diabetes Brother   . Cancer Brother   . Diabetes Sister   . Leukemia Sister   . Cancer Sister     breast  . Cancer Sister     breast  . Cancer Sister     thyroid  . Diabetes Sister     SOCIAL HISTORY: Social History   Social History  . Marital status: Married    Spouse name: Jordan Terry  . Number of children: 3  . Years of education: college   Occupational History  . Not on file.   Social History Main Topics  . Smoking status: Never Smoker  . Smokeless tobacco: Never Used  . Alcohol use No  . Drug use: No  . Sexual activity: Not on file   Other Topics Concern  . Not on file   Social History Narrative   Patient is married Jordan Terry) and lives at home with his wife.   Patient has three children.   Patient is retired.   Patient has a college education in [place].   Patient is right-handed.   Patient drinks one cup of coffee daily and one 8 oz of tea daily.     PHYSICAL EXAM  Vitals:   01/19/17 0847  BP: (!) 150/87  Pulse: 95  Weight: 174 lb 6.4 oz (79.1 kg)  Height: 6\' 2"  (1.88 m)   Body mass index is 22.39 kg/m.  Generalized: Well developed, in no acute distress  Head: normocephalic and atraumatic,. Oropharynx benign  Neck: Supple, no carotid bruits  Musculoskeletal: No deformity   Neurological examination   Mentation: Alert oriented to time, place, history taking. Attention span and concentration  appropriate. Recent and remote memory intact.  Follows all commands speech and language fluent.   Cranial nerve II-XII: Pupils were equal round reactive to light extraocular movements were full, visual field were full on confrontational test. Facial sensation and strength were normal. hearing was intact to finger rubbing bilaterally. Uvula tongue midline. head turning and shoulder shrug were normal and symmetric.Tongue protrusion into cheek strength was normal. Motor: normal bulk and tone, full strength in the BUE, BLE, fine finger movements normal, no pronator drift. No focal weakness Sensory: normal and symmetric to light touch,On the face arms and legs Coordination: finger-nose-finger, heel-to-shin bilaterally, no  dysmetria, no tremor. Reflexes: Symmetric upper and lower, plantar responses were flexor bilaterally. Gait and Station: Rising up from seated position without assistance, normal stance,  moderate stride, good arm swing, smooth turning, able to perform tiptoe, and heel walking without difficulty. Tandem gait is minimally unsteady steady no assistive device  DIAGNOSTIC DATA (LABS, IMAGING, TESTING) - I reviewed patient records, labs, notes, testing and imaging myself where available.  Lab Results  Component Value Date   WBC 5.3 03/12/2016   HGB 9.8 (L) 03/12/2016   HCT 30.2 (L) 03/12/2016   MCV 80.1 03/12/2016   PLT 101 (L) 03/12/2016      Component Value Date/Time   NA 139 03/12/2016 0454   K 3.8 03/12/2016 0454   CL 107 03/12/2016 0454   CO2 27 03/12/2016 0454   GLUCOSE 106 (H) 03/12/2016 0454   BUN 15 03/12/2016 0454   CREATININE 0.93 03/12/2016 0454   CALCIUM 8.3 (L) 03/12/2016 0454   PROT 6.1 (L) 03/11/2016 0830   ALBUMIN 3.2 (L) 03/11/2016 0830   AST 20 03/11/2016 0830   ALT 25 03/11/2016 0830   ALKPHOS 61 03/11/2016 0830   BILITOT 0.5 03/11/2016 0830   GFRNONAA >60 03/12/2016 0454   GFRAA >60 03/12/2016 0454    ASSESSMENT AND PLAN  83 y.o. year old male   has a past medical history of  Anxiety And depression; Arthritis; Bilateral tinnitus; Cancer ([place]); [facility]; Headache(784.0); Renal disorder; and Subjective dizziness here to follow-up.    PLAN: Decrease Nortriptyline to 25 mg at bedtime Be careful  With ambulation at risk for falls The patient continues to have ongoing symptoms of dizziness that have not been improved with medication. The patient feels worse when he is standing, better while sitting or lying down. He is not orthostatic today. We will decrease his  nortriptyline . He will follow-up in 6 months. Dennie Bible, [facility], [facility], APRN  [facility] [address] [place], [place] [address] [phone]

## 2017-01-19 NOTE — Progress Notes (Signed)
I have read the note, and I agree with the clinical assessment and plan.  Ramatoulaye Pack KEITH   

## 2017-02-23 ENCOUNTER — Telehealth: Payer: Self-pay | Admitting: Nurse Practitioner

## 2017-02-23 NOTE — Telephone Encounter (Signed)
Not to decrease further

## 2017-02-23 NOTE — Telephone Encounter (Signed)
Pt said he has tapered nortriptyline (PAMELOR) 25 MG capsule to 25mg  x 3 wks. Does he stop now or continue to taper to 10mg , he has some tablets left. Please advise

## 2017-02-23 NOTE — Telephone Encounter (Signed)
I called pt and relayed to keep on 25mg  po daily.  There is no change in dizziness.  He does have a lot of other issues going on in his life right now (sister in hospice, taking methotrexate weekly, may be coming off this next week, etc).  Since no change in dizziness did you want to decrease further?  He stated he did have 70 tabs of 10mg  nortriptyline if so.

## 2017-02-23 NOTE — Telephone Encounter (Signed)
Pt was asked at visit to decrease to 25mg  from 50mg . No further taper continue 25 mg dose unless he is having side effects

## 2017-02-24 NOTE — Telephone Encounter (Signed)
Spoke to pt and relayed that CM/NP wants him to stay on pamelor 63m po qhs.  He stated that was good.  No further action needed.

## 2017-02-24 NOTE — Telephone Encounter (Signed)
Pt called to inform Hoyle Sauer that his other physcian has taken him off his cancer medication(methotrexate) due to it lowering his white blood cells.  He now wants to stay on the nortriptyline, he states that he is not of a refill at this time, he has about 55.  Please call.

## 2017-04-08 ENCOUNTER — Telehealth: Payer: Self-pay | Admitting: Hematology

## 2017-04-08 ENCOUNTER — Telehealth: Payer: Self-pay | Admitting: Hematology and Oncology

## 2017-04-08 ENCOUNTER — Encounter: Payer: Self-pay | Admitting: Hematology

## 2017-04-08 ENCOUNTER — Encounter: Payer: Self-pay | Admitting: Hematology and Oncology

## 2017-04-08 NOTE — Telephone Encounter (Signed)
Attempted to notify the pt of the appt that has been scheduled. The phone continued to ring, vm didn't pick up. Letter mailed.

## 2017-04-08 NOTE — Telephone Encounter (Signed)
Received a call from Dr. Starla Link office to see if we have an earlier appt than 7/19. Appt has been rescheduled for the pt to see Dr. Irene Limbo on 6/25 at 11am. They will notify the pt of the appt. New Letter mailed.

## 2017-04-08 NOTE — Telephone Encounter (Signed)
Duplicate encounter

## 2017-04-14 ENCOUNTER — Emergency Department (HOSPITAL_COMMUNITY)
Admission: EM | Admit: 2017-04-14 | Discharge: 2017-04-14 | Disposition: A | Discharge status: Home / Self Care | Payer: Medicare Other | Attending: Emergency Medicine | Admitting: Emergency Medicine

## 2017-04-14 ENCOUNTER — Encounter (HOSPITAL_COMMUNITY): Payer: Self-pay

## 2017-04-14 DIAGNOSIS — R319 Hematuria, unspecified: Secondary | ICD-10-CM

## 2017-04-14 DIAGNOSIS — R339 Retention of urine, unspecified: Secondary | ICD-10-CM | POA: Diagnosis not present

## 2017-04-14 DIAGNOSIS — Z79899 Other long term (current) drug therapy: Secondary | ICD-10-CM | POA: Diagnosis not present

## 2017-04-14 DIAGNOSIS — Z8546 Personal history of malignant neoplasm of prostate: Secondary | ICD-10-CM | POA: Insufficient documentation

## 2017-04-14 HISTORY — DX: Hematuria, unspecified: R31.9

## 2017-04-14 MED ORDER — LORAZEPAM 1 MG PO TABS
1.0000 mg | ORAL_TABLET | Freq: Once | ORAL | Status: AC
  Administered 2017-04-14: 1 mg via ORAL
  Filled 2017-04-14: qty 1

## 2017-04-14 NOTE — ED Provider Notes (Signed)
[facility] DEPT Provider Note   CSN: [id number] Arrival date & time: 04/14/17  0300     History   Chief Complaint Chief Complaint  Patient presents with  . Urinary Retention    HPI VAIDEN ADAMES is a 83 y.o. male.  The history is provided by the patient.  He has a history of prostate cancer and self catheterizes himself twice a day. Last catheterization was done at 6 PM. He has been able to urinate on his own several times since then, but has not been able to urinate since about 1 AM. He went to insert his catheter this morning, and was unable to do so. He states for the last several weeks, catheter insertion has been difficult in that it feels like it is coiling up before it eventually passes through. He is status post radiation treatment for his prostate cancer. He denies fever, chills, sweats. Denies abdominal pain or back pain.  Past Medical History:  Diagnosis Date  . Anginal pain ([place]) 20 years ago  . Anxiety   . Arthritis   . Bilateral tinnitus    wears hearing aides  . Cancer [facility])    prostate  . Grover's disease   . Headache(784.0)    not since retired  . Renal disorder   . Vertigo     Patient Active Problem List   Diagnosis Date Noted  . Sepsis ([place]) 03/09/2016  . Prostate cancer ([place]) 03/08/2016  . Hematuria 03/08/2016  . Fever and chills 03/08/2016  . Fever 03/08/2016  . Abnormality of gait 05/31/2013  . Dizziness and giddiness 05/31/2013    Past Surgical History:  Procedure Laterality Date  . CARDIAC CATHETERIZATION  "20 years ago"  . CARPAL TUNNEL RELEASE Left 2008  . CATARACT EXTRACTION Bilateral 2014  . CYSTOSCOPY WITH URETHRAL DILATATION N/A 08/14/2013   Procedure: CYSTOSCOPY WITH URETHRAL DILATATION BALLOON DILATION OF URETHRAL STRICTURE;  Surgeon: Dutch Gray, MD;  Location: [facility];  Service: Urology;  Laterality: N/A;  BALLOON DILATION   . CYSTOSCOPY WITH URETHRAL DILATATION N/A 05/07/2014   Procedure: CYSTOSCOPY WITH BALLOON DILATION OF  URETHRAL STRICTURE;  Surgeon: Raynelle Bring, MD;  Location: [facility];  Service: Urology;  Laterality: N/A;  [place]   "scope"  . NOSE SURGERY  1991, 1998  . RADIOACTIVE SEED IMPLANT  2002   "for prostate cancer"  . SHOULDER SURGERY  2012   right  . THYROIDECTOMY, PARTIAL  1974  . VASECTOMY  1970       Home Medications    Prior to Admission medications   Medication Sig Start Date End Date Taking? Authorizing Provider  folic acid (FOLVITE) 1 MG tablet Take 1 mg by mouth daily. Six days a week. Pt takes every day but on sat.    [provider]  gabapentin (NEURONTIN) 100 MG capsule Take 200 mg by mouth 3 (three) times daily.     [provider]  LORazepam (ATIVAN) 0.5 MG tablet Take 1 mg by mouth at bedtime.    [provider]  methotrexate 2.5 MG tablet Take 12.5 mg by mouth every Saturday.     [provider]  nortriptyline (PAMELOR) 25 MG capsule Take 1 capsule (25 mg total) by mouth at bedtime. 01/19/17   Dennie Bible, NP  oxyCODONE-acetaminophen (PERCOCET) 7.5-325 MG per tablet Take 0.5-1 tablets by mouth every 8 (eight) hours as needed (pain.).     [provider]  temazepam (RESTORIL) 15 MG capsule Take 15 mg by mouth at bedtime.    [provider]  traMADol (ULTRAM) 50 MG tablet Take 50 mg by mouth every 6 (six) hours as needed for moderate pain.     [provider]  triamcinolone cream (KENALOG) 0.1 % Apply 1 application topically every morning. APPLY TO CHEST AND BACK EVERY MORNING FOR [place] DISEASE    [provider]    Family History Family History  Problem Relation Age of Onset  . Anxiety disorder Mother   . Cancer Father        colon, lung  . Diabetes Brother   . Cancer Brother   . Diabetes Sister   . Leukemia Sister   . Cancer Sister        breast  . Cancer Sister        breast  . Cancer Sister        thyroid  . Diabetes  Sister     Social History Social History  Substance Use Topics  . Smoking status: Never Smoker  . Smokeless tobacco: Never Used  . Alcohol use No     Allergies   Benadryl [diphenhydramine hcl] and Hydrocodone   Review of Systems Review of Systems  All other systems reviewed and are negative.    Physical Exam Updated Vital Signs BP (!) 139/101 (BP Location: Left Arm)   Pulse 86   Temp 97.8 F (36.6 C) (Oral)   Resp 18   Ht 6\' 2"  (1.88 m)   Wt 78 kg (172 lb)   SpO2 98%   BMI 22.08 kg/m   Physical Exam  Nursing note and vitals reviewed.  83 year old male, resting comfortably and in no acute distress. Vital signs are significant for hypertension. Oxygen saturation is 98%, which is normal. Head is normocephalic and atraumatic. PERRLA, EOMI. Oropharynx is clear. Neck is nontender and supple without adenopathy or JVD. Back is nontender and there is no CVA tenderness. Lungs are clear without rales, wheezes, or rhonchi. Chest is nontender. Heart has regular rate and rhythm without murmur. Abdomen is soft, flat, nontender without masses or hepatosplenomegaly and peristalsis is normoactive. Bladder is mildly distended and nontender. Extremities have no cyanosis or edema, full range of motion is present. Skin is warm and dry without rash. Neurologic: Mental status is normal, cranial nerves are intact, there are no motor or sensory deficits.  ED Treatments / Results  Labs (all labs ordered are listed, but only abnormal results are displayed) Labs Reviewed  URINE CULTURE  URINALYSIS, ROUTINE W REFLEX MICROSCOPIC    Procedures Procedures (including critical care time)  Medications Ordered in ED Medications  LORazepam (ATIVAN) tablet 1 mg (1 mg Oral Given 04/14/17 0254)     Initial Impression / Assessment and Plan / ED Course  I have reviewed the triage vital signs and the nursing notes.  Pertinent lab results that were available during my care of the patient were  reviewed by me and considered in my medical decision making (see chart for details).  Urinary retention with inability to self catheterize. Old records are reviewed, and he has no relevant past visits. Bladder scan shows over 400 mL in the bladder. Attempt to pass a Foley catheter was unsuccessful, and blood started coming from the meatus. Case is discussed with Dr. Bess Harvest of urology who has come in and successfully catheterize the patient. He will be sent home with indwelling Foley catheter in place, and follow-up with his  urologist..  Final Clinical Impressions(s) / ED Diagnoses   Final diagnoses:  Urinary retention    New Prescriptions New Prescriptions   No medications on file     Delora Fuel, MD 78/24/23 [phone]

## 2017-04-14 NOTE — ED Notes (Signed)
Pt attempted to self cath this morning at 0200, did not get any urine. Blood came out. Pt self caths regularly, has hx of urinary retention and UTI.

## 2017-04-14 NOTE — Consult Note (Signed)
Reason for Consult:Prostate Cancer, Urethral Stricture  Referring Physician: Delora Fuel MD  Jordan Terry is an 83 y.o. male.    HPI:   1) Prostate cancer: He is s/p treatment with combination EBRT/palladium seed implantation in January 2003. He has had no evidence for cancer recurrence.   TNM stage: cT1c Nx Mx  Gleason score: 3+4=7  PSA nadir: 0.06 (Jul 2015)   2)  History of urethral stricture / LUTS: He developed urinary retention in August 2008 and did undergo catheter placement after balloon dilation of his urethral stricture by Dr. Matilde Sprang in the emergency department. He subsequently passed a voiding trial. He developed worsening urinary urgency and frequency in January 2011 and was found to have a recurrent stricture and underwent repeat balloon dilation in May 2011. In May 2012, he developed nocturnal incontinence after shoulder surgery. Interestingly, he did not have any daytime symptoms. His symptoms ultimately resolved spontaneously. He has had intermittent recurrence of his urethral stricture requiring dilation and catheter placement. He has been managed with [facility] to keep his stricture open.   Aug 2008: Dilation of urethral stricture  May 2011: Balloon dilation of urethral stricture  May 2013: Balloon dilation of urethral stricture  April 2014: Dilation of urethral stricture  June 2014: Balloon dilation of urethral stricture  Oct 2014: Balloon dilation of urethral stricture  Jul 2015: Balloon dilation of urethral stricture  May 2017: Balloon dilation of urethral stricture   Today "Jordan Terry" is seen as emergent constult for inability to cath / urinary retention. Similar sympotms as from prior stricture. He normally follows with Dr. Alinda Money.    Past Medical History:  Diagnosis Date  . Anginal pain ([place]) 20 years ago  . Anxiety   . Arthritis   . Bilateral tinnitus    wears hearing aides  . Cancer [facility])    prostate  . Grover's disease   . Headache(784.0)    not since  retired  . Renal disorder   . Vertigo     Past Surgical History:  Procedure Laterality Date  . CARDIAC CATHETERIZATION  "20 years ago"  . CARPAL TUNNEL RELEASE Left 2008  . CATARACT EXTRACTION Bilateral 2014  . CYSTOSCOPY WITH URETHRAL DILATATION N/A 08/14/2013   Procedure: CYSTOSCOPY WITH URETHRAL DILATATION BALLOON DILATION OF URETHRAL STRICTURE;  Surgeon: Dutch Gray, MD;  [facility];  Service: Urology;  Laterality: N/A;  BALLOON DILATION   . CYSTOSCOPY WITH URETHRAL DILATATION N/A 05/07/2014   Procedure: CYSTOSCOPY WITH BALLOON DILATION OF URETHRAL STRICTURE;  Surgeon: Raynelle Bring, MD;  [facility];  Service: Urology;  Laterality: N/A;  [place]   "scope"  . NOSE SURGERY  1991, 1998  . RADIOACTIVE SEED IMPLANT  2002   "for prostate cancer"  . SHOULDER SURGERY  2012   right  . THYROIDECTOMY, PARTIAL  1974  . VASECTOMY  1970    Family History  Problem Relation Age of Onset  . Anxiety disorder Mother   . Cancer Father        colon, lung  . Diabetes Brother   . Cancer Brother   . Diabetes Sister   . Leukemia Sister   . Cancer Sister        breast  . Cancer Sister        breast  . Cancer Sister        thyroid  . Diabetes Sister     Social History:  reports  that he has never smoked. He has never used smokeless tobacco. He reports that he does not drink alcohol or use drugs.  Allergies:  Allergies  Allergen Reactions  . Benadryl [Diphenhydramine Hcl]     MAKES HIM GO CRAZY  . Hydrocodone Itching    Small amounts are okay    Medications: I have reviewed the patient's current medications.  No results found for this or any previous visit (from the past 48 hour(s)).  No results found.  Review of Systems  Constitutional: Negative.  Negative for chills and fever.  HENT: Negative.   Eyes: Negative.   Respiratory: Negative.   Cardiovascular: Negative.   Gastrointestinal: Negative.    Genitourinary: Positive for frequency, hematuria and urgency. Negative for flank pain.  Musculoskeletal: Negative.   Skin: Negative.   Neurological: Negative.   Endo/Heme/Allergies: Negative.   Psychiatric/Behavioral: Negative.    Blood pressure (!) 151/83, pulse 89, temperature 97.8 F (36.6 C), resp. rate 20, height 6\' 2"  (1.88 m), weight 78 kg (172 lb), SpO2 96 %. Physical Exam  Constitutional: He appears well-developed.  HENT:  Head: Normocephalic.  Eyes: Pupils are equal, round, and reactive to light.  Neck: Normal range of motion.  Cardiovascular: Normal rate.   Respiratory: Effort normal.  GI: Soft.  Genitourinary:  Genitourinary Comments: Blood at meatus,, scant urine  Musculoskeletal: Normal range of motion.  Neurological: He is alert.  Skin: Skin is warm.  Psychiatric: He has a normal mood and affect.    CYSTO / DILATION:  Using aseptic technique flexible cystoscopy performed with 36F cystoscope. High grade stricture with several false passes noted in area of membranous urethra / prostatic apex. True lumen identifiecd and 0.38 sensor wire advanced across. Dilated 39F with balloon dilation and repeat cysto confirms open to bladder. 36F council placed with immediate return 450cc urine and connected to leg bag.    Assessment/Plan:  1) Prostate cancer: He is over 10 years s/p treatment with PSA <0.2. NO indication for further surveillance.   2)  History of urethral stricture / LUTS - s/p dilation again as per above. Keep current catheter. We will arrange f/u with NP at our office for trial of void in about 2 weeks. He will also keep appt with Dr Alinda Money 8/22.     [name] 04/14/2017, 6:19 AM

## 2017-04-19 ENCOUNTER — Ambulatory Visit (HOSPITAL_BASED_OUTPATIENT_CLINIC_OR_DEPARTMENT_OTHER): Payer: Medicare Other | Admitting: Hematology

## 2017-04-19 ENCOUNTER — Inpatient Hospital Stay (HOSPITAL_COMMUNITY)
Admission: EM | Admit: 2017-04-19 | Discharge: 2017-04-22 | DRG: 690 | Disposition: A | Discharge status: Home / Self Care | Payer: Medicare Other | Attending: Family Medicine | Admitting: Family Medicine

## 2017-04-19 ENCOUNTER — Other Ambulatory Visit (HOSPITAL_COMMUNITY)
Admission: RE | Admit: 2017-04-19 | Discharge: 2017-04-19 | Disposition: A | Discharge status: Home / Self Care | Payer: Medicare Other | Source: Ambulatory Visit | Attending: Hematology | Admitting: Hematology

## 2017-04-19 ENCOUNTER — Telehealth: Payer: Self-pay | Admitting: Hematology

## 2017-04-19 ENCOUNTER — Encounter (HOSPITAL_COMMUNITY): Payer: Self-pay

## 2017-04-19 ENCOUNTER — Ambulatory Visit (HOSPITAL_BASED_OUTPATIENT_CLINIC_OR_DEPARTMENT_OTHER): Payer: Medicare Other

## 2017-04-19 ENCOUNTER — Encounter: Payer: Self-pay | Admitting: Hematology

## 2017-04-19 VITALS — BP 129/67 | HR 79 | Temp 98.7°F | Resp 18 | Ht 74.0 in | Wt 170.0 lb

## 2017-04-19 DIAGNOSIS — D8941 Monoclonal mast cell activation syndrome: Secondary | ICD-10-CM | POA: Insufficient documentation

## 2017-04-19 DIAGNOSIS — C8590 Non-Hodgkin lymphoma, unspecified, unspecified site: Secondary | ICD-10-CM

## 2017-04-19 DIAGNOSIS — N368 Other specified disorders of urethra: Secondary | ICD-10-CM | POA: Diagnosis present

## 2017-04-19 DIAGNOSIS — D61818 Other pancytopenia: Secondary | ICD-10-CM | POA: Diagnosis present

## 2017-04-19 DIAGNOSIS — Z8546 Personal history of malignant neoplasm of prostate: Secondary | ICD-10-CM

## 2017-04-19 DIAGNOSIS — L111 Transient acantholytic dermatosis [Grover]: Secondary | ICD-10-CM | POA: Diagnosis present

## 2017-04-19 DIAGNOSIS — Z888 Allergy status to other drugs, medicaments and biological substances status: Secondary | ICD-10-CM

## 2017-04-19 DIAGNOSIS — Z87891 Personal history of nicotine dependence: Secondary | ICD-10-CM

## 2017-04-19 DIAGNOSIS — Z789 Other specified health status: Secondary | ICD-10-CM

## 2017-04-19 DIAGNOSIS — Z79899 Other long term (current) drug therapy: Secondary | ICD-10-CM

## 2017-04-19 DIAGNOSIS — B961 Klebsiella pneumoniae [K. pneumoniae] as the cause of diseases classified elsewhere: Secondary | ICD-10-CM | POA: Diagnosis present

## 2017-04-19 DIAGNOSIS — N3091 Cystitis, unspecified with hematuria: Principal | ICD-10-CM | POA: Diagnosis present

## 2017-04-19 DIAGNOSIS — K5900 Constipation, unspecified: Secondary | ICD-10-CM | POA: Diagnosis present

## 2017-04-19 DIAGNOSIS — C61 Malignant neoplasm of prostate: Secondary | ICD-10-CM | POA: Diagnosis present

## 2017-04-19 DIAGNOSIS — D696 Thrombocytopenia, unspecified: Secondary | ICD-10-CM

## 2017-04-19 DIAGNOSIS — B965 Pseudomonas (aeruginosa) (mallei) (pseudomallei) as the cause of diseases classified elsewhere: Secondary | ICD-10-CM | POA: Diagnosis present

## 2017-04-19 DIAGNOSIS — F419 Anxiety disorder, unspecified: Secondary | ICD-10-CM | POA: Diagnosis present

## 2017-04-19 HISTORY — DX: Other pancytopenia: D61.818

## 2017-04-19 HISTORY — DX: Other specified health status: Z78.9

## 2017-04-19 LAB — CBC & DIFF AND RETIC
BASO%: 0.4 % (ref 0.0–2.0)
BASOS ABS: 0 10*3/uL (ref 0.0–0.1)
EOS%: 3.6 % (ref 0.0–7.0)
Eosinophils Absolute: 0.1 10*3/uL (ref 0.0–0.5)
HEMATOCRIT: 30.8 % — AB (ref 38.4–49.9)
HGB: 9.8 g/dL — ABNORMAL LOW (ref 13.0–17.1)
IMMATURE RETIC FRACT: 14.1 % — AB (ref 3.00–10.60)
LYMPH%: 41.9 % (ref 14.0–49.0)
MCH: 27.4 pg (ref 27.2–33.4)
MCHC: 31.8 g/dL — ABNORMAL LOW (ref 32.0–36.0)
MCV: 86 fL (ref 79.3–98.0)
MONO#: 0.1 10*3/uL (ref 0.1–0.9)
MONO%: 4 % (ref 0.0–14.0)
NEUT#: 1.3 10*3/uL — ABNORMAL LOW (ref 1.5–6.5)
NEUT%: 50.1 % (ref 39.0–75.0)
PLATELETS: 32 10*3/uL — AB (ref 140–400)
RBC: 3.58 10*6/uL — ABNORMAL LOW (ref 4.20–5.82)
RDW: 20.6 % — ABNORMAL HIGH (ref 11.0–14.6)
RETIC CT ABS: 44.03 10*3/uL (ref 34.80–93.90)
Retic %: 1.23 % (ref 0.80–1.80)
WBC: 2.5 10*3/uL — ABNORMAL LOW (ref 4.0–10.3)
lymph#: 1.1 10*3/uL (ref 0.9–3.3)

## 2017-04-19 LAB — TYPE AND SCREEN
ABO/RH(D): A POS
Antibody Screen: NEGATIVE

## 2017-04-19 LAB — BASIC METABOLIC PANEL
ANION GAP: 8 (ref 5–15)
BUN: 17 mg/dL (ref 6–20)
CALCIUM: 8.5 mg/dL — AB (ref 8.9–10.3)
CHLORIDE: 103 mmol/L (ref 101–111)
CO2: 28 mmol/L (ref 22–32)
Creatinine, Ser: 1.01 mg/dL (ref 0.61–1.24)
GFR calc Af Amer: >60 mL/min (ref >60)
GFR calc non Af Amer: >60 mL/min (ref >60)
GLUCOSE: 158 mg/dL — AB (ref 65–99)
Potassium: 3.6 mmol/L (ref 3.5–5.1)
Sodium: 139 mmol/L (ref 135–145)

## 2017-04-19 LAB — CBC WITH DIFFERENTIAL/PLATELET
BASOS PCT: 1 %
Basophils Absolute: 0 10*3/uL (ref 0.0–0.1)
EOS ABS: 0 10*3/uL (ref 0.0–0.7)
Eosinophils Relative: 2 %
HCT: 27.4 % — ABNORMAL LOW (ref 39.0–52.0)
HEMOGLOBIN: 9 g/dL — AB (ref 13.0–17.0)
Lymphocytes Relative: 38 %
Lymphs Abs: 0.8 10*3/uL (ref 0.7–4.0)
MCH: 27.2 pg (ref 26.0–34.0)
MCHC: 32.8 g/dL (ref 30.0–36.0)
MCV: 82.8 fL (ref 78.0–100.0)
MONO ABS: 0.1 10*3/uL (ref 0.1–1.0)
Monocytes Relative: 4 %
NEUTROS ABS: 1.2 10*3/uL — AB (ref 1.7–7.7)
Neutrophils Relative %: 55 %
PLATELETS: 26 10*3/uL — AB (ref 150–400)
RBC: 3.31 MIL/uL — ABNORMAL LOW (ref 4.22–5.81)
RDW: 20.5 % — ABNORMAL HIGH (ref 11.5–15.5)
WBC: 2.1 10*3/uL — ABNORMAL LOW (ref 4.0–10.5)

## 2017-04-19 LAB — URINALYSIS, ROUTINE W REFLEX MICROSCOPIC
BILIRUBIN URINE: NEGATIVE
GLUCOSE, UA: NEGATIVE mg/dL
KETONES UR: NEGATIVE mg/dL
NITRITE: POSITIVE — AB
PH: 7 (ref 5.0–8.0)
Protein, ur: 100 mg/dL — AB
SPECIFIC GRAVITY, URINE: 1.013 (ref 1.005–1.030)
Squamous Epithelial / LPF: NONE SEEN

## 2017-04-19 LAB — COMPREHENSIVE METABOLIC PANEL
ALT: 25 U/L (ref 0–55)
ANION GAP: 11 meq/L (ref 3–11)
AST: 19 U/L (ref 5–34)
Albumin: 3.9 g/dL (ref 3.5–5.0)
Alkaline Phosphatase: 60 U/L (ref 40–150)
BILIRUBIN TOTAL: 0.59 mg/dL (ref 0.20–1.20)
BUN: 16.2 mg/dL (ref 7.0–26.0)
CALCIUM: 9.5 mg/dL (ref 8.4–10.4)
CO2: 29 mEq/L (ref 22–29)
CREATININE: 1.1 mg/dL (ref 0.7–1.3)
Chloride: 103 mEq/L (ref 98–109)
EGFR: 65 mL/min/{1.73_m2} — ABNORMAL LOW (ref >90)
Glucose: 93 mg/dl (ref 70–140)
Potassium: 4 mEq/L (ref 3.5–5.1)
Sodium: 143 mEq/L (ref 136–145)
TOTAL PROTEIN: 7.5 g/dL (ref 6.4–8.3)

## 2017-04-19 LAB — CHCC SMEAR

## 2017-04-19 LAB — PROTIME-INR
INR: 1.04
PROTHROMBIN TIME: 13.6 s (ref 11.4–15.2)

## 2017-04-19 LAB — TECHNOLOGIST REVIEW

## 2017-04-19 LAB — LACTATE DEHYDROGENASE: LDH: 191 U/L (ref 125–245)

## 2017-04-19 LAB — ABO/RH: ABO/RH(D): A POS

## 2017-04-19 MED ORDER — OXYCODONE-ACETAMINOPHEN 7.5-325 MG PO TABS
0.5000 | ORAL_TABLET | Freq: Three times a day (TID) | ORAL | Status: DC | PRN
  Administered 2017-04-20 – 2017-04-22 (×3): 1 via ORAL
  Filled 2017-04-19 (×7): qty 1

## 2017-04-19 MED ORDER — LORAZEPAM 1 MG PO TABS
1.0000 mg | ORAL_TABLET | Freq: Every day | ORAL | Status: DC
  Administered 2017-04-19 – 2017-04-20 (×2): 1 mg via ORAL
  Filled 2017-04-19 (×2): qty 1

## 2017-04-19 MED ORDER — ACETAMINOPHEN 650 MG RE SUPP: 650.0000 mg | Freq: Four times a day (QID) | RECTAL | Status: DC | PRN

## 2017-04-19 MED ORDER — TEMAZEPAM 15 MG PO CAPS
15.0000 mg | ORAL_CAPSULE | Freq: Every day | ORAL | Status: DC
  Administered 2017-04-19 – 2017-04-21 (×3): 15 mg via ORAL
  Filled 2017-04-19 (×3): qty 1

## 2017-04-19 MED ORDER — SODIUM CHLORIDE 0.9 % IV SOLN
INTRAVENOUS | Status: DC
  Administered 2017-04-19: 23:00:00 via INTRAVENOUS

## 2017-04-19 MED ORDER — ONDANSETRON HCL 4 MG PO TABS: 4.0000 mg | ORAL_TABLET | Freq: Four times a day (QID) | ORAL | Status: DC | PRN

## 2017-04-19 MED ORDER — ACETAMINOPHEN 325 MG PO TABS: 650.0000 mg | ORAL_TABLET | Freq: Four times a day (QID) | ORAL | Status: DC | PRN

## 2017-04-19 MED ORDER — SODIUM CHLORIDE 0.9 % IV SOLN
Freq: Once | INTRAVENOUS | Status: DC
  Administered 2017-04-20: via INTRAVENOUS

## 2017-04-19 MED ORDER — ONDANSETRON HCL 4 MG/2ML IJ SOLN: 4.0000 mg | Freq: Four times a day (QID) | INTRAMUSCULAR | Status: DC | PRN

## 2017-04-19 MED ORDER — NORTRIPTYLINE HCL 25 MG PO CAPS
25.0000 mg | ORAL_CAPSULE | Freq: Every day | ORAL | Status: DC
  Administered 2017-04-19 – 2017-04-21 (×3): 25 mg via ORAL
  Filled 2017-04-19 (×4): qty 1

## 2017-04-19 MED ORDER — TRAMADOL HCL 50 MG PO TABS: 50.0000 mg | ORAL_TABLET | Freq: Four times a day (QID) | ORAL | Status: DC | PRN

## 2017-04-19 MED ORDER — GABAPENTIN 100 MG PO CAPS
200.0000 mg | ORAL_CAPSULE | Freq: Three times a day (TID) | ORAL | Status: DC
  Administered 2017-04-19 – 2017-04-22 (×8): 200 mg via ORAL
  Filled 2017-04-19 (×8): qty 2

## 2017-04-19 NOTE — H&P (Signed)
History and Physical    Jordan Terry JGG:[id number] DOB: October 18, 1932 DOA: 04/19/2017  PCP: [name], MD  Patient coming from: Home.  Chief Complaint: Bleeding per urethra.  HPI: Jordan Terry is a 83 y.o. male with history of prostate cancer in remission who had come to the ER last week with urinary retention and had Foley catheter placement presents to the ER with persistent bleeding per urethra over the last few days. Denies any abdominal pain nausea vomiting or fever or chills. Patient was originally scheduled to follow with Dr. Alinda Money. 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  ED Course: In the ER patient was found to have active bleeding per the meatus. Urologist was consulted at this time urologist recommended perineal pressure. Since patient also had significant pancytopenia oncologist was consulted and was advised to have platelet transfusion since patient was actively bleeding.  Review of Systems: As per HPI, rest all negative.   Past Medical History:  Diagnosis Date  . Anginal pain ([place]) 20 years ago  . Anxiety   . Arthritis   . Bilateral tinnitus    wears hearing aides  . Cancer [facility])    prostate  . Grover's disease   . Headache(784.0)    not since retired  . Hematuria 04/14/2017  . Self-catheterizes urinary bladder 04/19/2017  . Vertigo     Past Surgical History:  Procedure Laterality Date  . CARDIAC CATHETERIZATION  "20 years ago"  . CARPAL TUNNEL RELEASE Left 2008  . CATARACT EXTRACTION Bilateral 2014  . CYSTOSCOPY WITH URETHRAL DILATATION N/A 08/14/2013   Procedure: CYSTOSCOPY WITH URETHRAL DILATATION BALLOON DILATION OF URETHRAL STRICTURE;  Surgeon: Dutch Gray, MD;  [facility];  Service: Urology;  Laterality: N/A;  BALLOON DILATION   . CYSTOSCOPY WITH URETHRAL DILATATION N/A 05/07/2014   Procedure: CYSTOSCOPY WITH BALLOON DILATION OF URETHRAL STRICTURE;  Surgeon: Raynelle Bring, MD;   Location: [facility];  Service: Urology;  Laterality: N/A;  [place]   "scope"  . NOSE SURGERY  1991, 1998  . RADIOACTIVE SEED IMPLANT  2002   "for prostate cancer"  . SHOULDER SURGERY  2012   right  . THYROIDECTOMY, PARTIAL  1974  . VASECTOMY  1970     reports that he quit smoking about 39 years ago. His smoking use included Cigars. He quit after 10.00 years of use. He has never used smokeless tobacco. He reports that he does not drink alcohol or use drugs.  Allergies  Allergen Reactions  . Benadryl [Diphenhydramine Hcl] Anxiety    MAKES HIM GO CRAZY  . Hydrocodone Itching    Small amounts are okay    Family History  Problem Relation Age of Onset  . Anxiety disorder Mother   . Cancer Father        colon, lung  . Diabetes Brother   . Cancer Brother   . Diabetes Sister   . Leukemia Sister   . Cancer Sister        breast  . Cancer Sister        breast  . Cancer Sister        thyroid  . Diabetes Sister     Prior to Admission medications   Medication Sig Start Date End Date Taking? Authorizing Provider  gabapentin (NEURONTIN) 100 MG capsule Take 200 mg by mouth 3 (three) times daily.    Yes  [provider]  LORazepam (ATIVAN) 0.5 MG tablet Take 1 mg by mouth at bedtime.   Yes [provider]  nortriptyline (PAMELOR) 25 MG capsule Take 1 capsule (25 mg total) by mouth at bedtime. 01/19/17  Yes Dennie Bible, NP  oxyCODONE-acetaminophen (PERCOCET) 7.5-325 MG per tablet Take 0.5-1 tablets by mouth every 8 (eight) hours as needed (pain.).    Yes [provider]  temazepam (RESTORIL) 15 MG capsule Take 15 mg by mouth at bedtime.   Yes [provider]  traMADol (ULTRAM) 50 MG tablet Take 50 mg by mouth every 6 (six) hours as needed for moderate pain.    Yes [provider]  triamcinolone cream (KENALOG) 0.1 % Apply 1 application topically every morning. APPLY TO CHEST AND  BACK EVERY MORNING FOR [place] DISEASE   Yes [provider]    Physical Exam: Vitals:   04/19/17 1736 04/19/17 1852 04/19/17 2053 04/19/17 2209  BP:  126/69 131/77 131/71  Pulse:  82 82 84  Resp:  15 16 18   Temp:    99.3 F (37.4 C)  TempSrc:    Oral  SpO2: 98% 97% 100% 99%  Weight:    77.3 kg (170 lb 6.7 oz)  Height:    6\' 2"  (1.88 m)      Constitutional: Moderately built and nourished. Vitals:   04/19/17 1736 04/19/17 1852 04/19/17 2053 04/19/17 2209  BP:  126/69 131/77 131/71  Pulse:  82 82 84  Resp:  15 16 18   Temp:    99.3 F (37.4 C)  TempSrc:    Oral  SpO2: 98% 97% 100% 99%  Weight:    77.3 kg (170 lb 6.7 oz)  Height:    6\' 2"  (1.88 m)   Eyes: Anicteric no pallor. ENMT: No discharge from the ears eyes nose and mouth. Neck: No mass felt. No neck rigidity. Respiratory: No rhonchi or crepitations. Cardiovascular: S1 and S2 heard no murmurs appreciated. Abdomen: Soft nontender bowel sounds present. Musculoskeletal: No edema. No joint effusion. Skin: No rash. Skin appears warm. Neurologic: Alert awake oriented to time place and person. Moves all extremities. Psychiatric: Appears normal. Normal affect.   Labs on Admission: I have personally reviewed following labs and imaging studies  CBC:  Recent Labs Lab 04/19/17 1254 04/19/17 1803  WBC 2.5* 2.1*  NEUTROABS 1.3* 1.2*  HGB 9.8* 9.0*  HCT 30.8* 27.4*  MCV 86.0 82.8  PLT 32* 26*   Basic Metabolic Panel:  Recent Labs Lab 04/19/17 1254 04/19/17 1803  NA 143 139  K 4.0 3.6  CL  --  103  CO2 29 28  GLUCOSE 93 158*  BUN 16.2 17  CREATININE 1.1 1.01  CALCIUM 9.5 8.5*   GFR: Estimated Creatinine Clearance: 61.7 mL/min (by C-G formula based on SCr of 1.01 mg/dL). Liver Function Tests:  Recent Labs Lab 04/19/17 1254  AST 19  ALT 25  ALKPHOS 60  BILITOT 0.59  PROT 7.5  ALBUMIN 3.9   No results for input(s): LIPASE, AMYLASE in the last 168 hours. No results for input(s): AMMONIA  in the last 168 hours. Coagulation Profile:  Recent Labs Lab 04/19/17 1803  INR 1.04   Cardiac Enzymes: No results for input(s): CKTOTAL, CKMB, CKMBINDEX, TROPONINI in the last 168 hours. BNP (last 3 results) No results for input(s): PROBNP in the last 8760 hours. HbA1C: No results for input(s): HGBA1C in the last 72 hours. CBG: No results for input(s): GLUCAP in the last 168 hours. Lipid  Profile: No results for input(s): CHOL, HDL, LDLCALC, TRIG, CHOLHDL, LDLDIRECT in the last 72 hours. Thyroid Function Tests: No results for input(s): TSH, T4TOTAL, FREET4, T3FREE, THYROIDAB in the last 72 hours. Anemia Panel:  Recent Labs  04/19/17 1254  RETICCTPCT 1.23   Urine analysis:    Component Value Date/Time   COLORURINE YELLOW 03/08/2016 1427   APPEARANCEUR CLOUDY (A) 03/08/2016 1427   LABSPEC 1.008 03/08/2016 1427   PHURINE 6.0 03/08/2016 1427   GLUCOSEU NEGATIVE 03/08/2016 1427   HGBUR LARGE (A) 03/08/2016 1427   BILIRUBINUR NEGATIVE 03/08/2016 1427   KETONESUR NEGATIVE 03/08/2016 1427   PROTEINUR NEGATIVE 03/08/2016 1427   UROBILINOGEN 0.2 03/29/2015 0611   NITRITE NEGATIVE 03/08/2016 1427   LEUKOCYTESUR SMALL (A) 03/08/2016 1427   Sepsis Labs: @LABRCNTIP (procalcitonin:4,lacticidven:4) ) Recent Results (from the past 240 hour(s))  TECHNOLOGIST REVIEW     Status: None   Collection Time: 04/19/17 12:54 PM  Result Value Ref Range Status   Technologist Review   Final    mod poly and ovalos, few teardrops, schistocytes, acanthos and helmet cells      Radiological Exams on Admission: No results found.   Assessment/Plan Principal Problem:   Urethral bleeding Active Problems:   Prostate cancer ([place])   Pancytopenia ([place])    1. Urethral bleeding/hematuria - since patient has significant thrombocytopenia platelet transfusion has been ordered. If bleeding continues reconsult urologist in a.m. Patient is on Foley catheter. UA is pending. 2. Pancytopenia - has been on  methotrexate until recently. Patient is receiving platelet transfusion. Follow CBC. Patient is afebrile. 3. Urinary retention status post Foley catheter placement last week. 4. History of prostate cancer in remission.    DVT prophylaxis: SCDs.  Code Status: Full code.  Family Communication: Discuss with patient.  Disposition Plan: Home.  Consults called: ER physician had discussed the on-call oncologist Dr. Irene Limbo and urologist.  Admission status: Observation.    Rise Patience MD [facility] Pager [phone].  If 7PM-7AM, please contact night-coverage [email or web address] Password [email or web address]  04/19/2017, 10:20 PM

## 2017-04-19 NOTE — ED Provider Notes (Signed)
[facility] DEPT Provider Note   CSN: [id number] Arrival date & time: 04/19/17  1722     History   Chief Complaint Chief Complaint  Patient presents with  . Hematuria  . FOLEY    DIFFICULT PLACEMENT BY [facility] 1 WEEK AGO    HPI Jordan Terry is a 83 y.o. male with PMH/o Prostate cancer who presents with hematuria and penile bleeding. Patient has an indwelling catheter in place. He was seen in the ED on 04/14/17 because the catheter fell out and he could not get it reinserted. At that time, Urology was consulted who came to the ED and placed the catheter. Since then patient has had intermittent episodes of hematuria but states that it would resolve. Patient notes that on 6/24 he began having some intermittent blood at the head of the penis. He describes it as "oozing" that has just progressively worsened. He reports that today he noted worsening bleeding and noted that he had soaked a sanitary pad and his underwear, prompting EMS call. Patient denies any penile or testicular pain. He denies any fever, dysuria, urinary retention. Of note, patient has a history of prostate cancer in 2003 where he received radiation and a seed implant.   Urology: Alinda Money  The history is provided by the patient.    Past Medical History:  Diagnosis Date  . Anginal pain ([place]) 20 years ago  . Anxiety   . Arthritis   . Bilateral tinnitus    wears hearing aides  . Cancer [facility])    prostate  . Grover's disease   . Headache(784.0)    not since retired  . Hematuria 04/14/2017  . Self-catheterizes urinary bladder 04/19/2017  . Vertigo     Patient Active Problem List   Diagnosis Date Noted  . Urethral bleeding 04/19/2017  . Pancytopenia ([place]) 04/19/2017  . Sepsis ([place]) 03/09/2016  . Prostate cancer ([place]) 03/08/2016  . Hematuria 03/08/2016  . Fever and chills 03/08/2016  . Fever 03/08/2016  . Abnormality of gait 05/31/2013  . Dizziness and giddiness 05/31/2013    Past Surgical  History:  Procedure Laterality Date  . CARDIAC CATHETERIZATION  "20 years ago"  . CARPAL TUNNEL RELEASE Left 2008  . CATARACT EXTRACTION Bilateral 2014  . CYSTOSCOPY WITH URETHRAL DILATATION N/A 08/14/2013   Procedure: CYSTOSCOPY WITH URETHRAL DILATATION BALLOON DILATION OF URETHRAL STRICTURE;  Surgeon: Dutch Gray, MD;  Location: [facility];  Service: Urology;  Laterality: N/A;  BALLOON DILATION   . CYSTOSCOPY WITH URETHRAL DILATATION N/A 05/07/2014   Procedure: CYSTOSCOPY WITH BALLOON DILATION OF URETHRAL STRICTURE;  Surgeon: Raynelle Bring, MD;  Location: [facility];  Service: Urology;  Laterality: N/A;  [place]   "scope"  . NOSE SURGERY  1991, 1998  . RADIOACTIVE SEED IMPLANT  2002   "for prostate cancer"  . SHOULDER SURGERY  2012   right  . THYROIDECTOMY, PARTIAL  1974  . VASECTOMY  1970       Home Medications    Prior to Admission medications   Medication Sig Start Date End Date Taking? Authorizing Provider  gabapentin (NEURONTIN) 100 MG capsule Take 200 mg by mouth 3 (three) times daily.    Yes [provider]  LORazepam (ATIVAN) 0.5 MG tablet Take 1 mg by mouth at bedtime.   Yes [provider]  nortriptyline (PAMELOR) 25 MG capsule Take 1 capsule (25 mg total) by mouth at bedtime. 01/19/17  Yes  Dennie Bible, NP  oxyCODONE-acetaminophen (PERCOCET) 7.5-325 MG per tablet Take 0.5-1 tablets by mouth every 8 (eight) hours as needed (pain.).    Yes [provider]  temazepam (RESTORIL) 15 MG capsule Take 15 mg by mouth at bedtime.   Yes [provider]  traMADol (ULTRAM) 50 MG tablet Take 50 mg by mouth every 6 (six) hours as needed for moderate pain.    Yes [provider]  triamcinolone cream (KENALOG) 0.1 % Apply 1 application topically every morning. APPLY TO CHEST AND BACK EVERY MORNING FOR [place] DISEASE   Yes [provider]    Family History Family History    Problem Relation Age of Onset  . Anxiety disorder Mother   . Cancer Father        colon, lung  . Diabetes Brother   . Cancer Brother   . Diabetes Sister   . Leukemia Sister   . Cancer Sister        breast  . Cancer Sister        breast  . Cancer Sister        thyroid  . Diabetes Sister     Social History Social History  Substance Use Topics  . Smoking status: Former Smoker    Years: 10.00    Types: Cigars    Quit date: 1979  . Smokeless tobacco: Never Used  . Alcohol use No     Allergies   Benadryl [diphenhydramine hcl] and Hydrocodone   Review of Systems Review of Systems  Cardiovascular: Negative for chest pain.  Genitourinary: Positive for hematuria. Negative for dysuria.       Penile bleeding  All other systems reviewed and are negative.    Physical Exam Updated Vital Signs BP 122/72   Pulse 78   Temp 98 F (36.7 C) (Oral)   Resp 20   Ht 6\' 2"  (1.88 m)   Wt 77.3 kg (170 lb 6.7 oz)   SpO2 99%   BMI 21.88 kg/m   Physical Exam  Constitutional: He appears well-developed and well-nourished.  Sitting comfortably on examination table  HENT:  Head: Normocephalic and atraumatic.  Eyes: Conjunctivae and EOM are normal. Right eye exhibits no discharge. Left eye exhibits no discharge. No scleral icterus.  Pulmonary/Chest: Effort normal.  Abdominal: Normal appearance. He exhibits no distension. There is no tenderness. There is no rigidity and no guarding.  Genitourinary: Testes normal. Right testis shows no swelling and no tenderness. Left testis shows no swelling and no tenderness. Circumcised.  Genitourinary Comments: The exam was performed with a chaperone present. Indwelling catheter in place. Blood surrounding the urethral meatus with large clotted blood extending down the shaft of the penis. No evidence of sores, lesions, or cuts on the penis. Indwelling catheter appears intact.  Neurological: He is alert.  Skin: Skin is warm and dry.  Psychiatric: He  has a normal mood and affect. His speech is normal and behavior is normal.  Nursing note and vitals reviewed.    ED Treatments / Results  Labs (all labs ordered are listed, but only abnormal results are displayed) Labs Reviewed  CBC WITH DIFFERENTIAL/PLATELET - Abnormal; Notable for the following:       Result Value   WBC 2.1 (*)    RBC 3.31 (*)    Hemoglobin 9.0 (*)    HCT 27.4 (*)    RDW 20.5 (*)    Platelets 26 (*)    Neutro Abs 1.2 (*)    All other  components within normal limits  BASIC METABOLIC PANEL - Abnormal; Notable for the following:    Glucose, Bld 158 (*)    Calcium 8.5 (*)    All other components within normal limits  URINALYSIS, ROUTINE W REFLEX MICROSCOPIC - Abnormal; Notable for the following:    APPearance CLOUDY (*)    Hgb urine dipstick LARGE (*)    Protein, ur 100 (*)    Nitrite POSITIVE (*)    Leukocytes, UA LARGE (*)    Bacteria, UA MANY (*)    Non Squamous Epithelial 0-5 (*)    All other components within normal limits  PROTIME-INR  BASIC METABOLIC PANEL  CBC  TYPE AND SCREEN  ABO/RH  PREPARE PLATELET PHERESIS    EKG  EKG Interpretation None       Radiology No results found.  Procedures Procedures (including critical care time)  Medications Ordered in ED Medications  nortriptyline (PAMELOR) capsule 25 mg (25 mg Oral Given 04/19/17 2303)  traMADol (ULTRAM) tablet 50 mg (not administered)  gabapentin (NEURONTIN) capsule 200 mg (200 mg Oral Given 04/19/17 2304)  LORazepam (ATIVAN) tablet 1 mg (1 mg Oral Given 04/19/17 2304)  oxyCODONE-acetaminophen (PERCOCET) 7.5-325 MG per tablet 0.5-1 tablet (not administered)  temazepam (RESTORIL) capsule 15 mg (15 mg Oral Given 04/19/17 2304)  acetaminophen (TYLENOL) tablet 650 mg (not administered)    Or  acetaminophen (TYLENOL) suppository 650 mg (not administered)  ondansetron (ZOFRAN) tablet 4 mg (not administered)    Or  ondansetron (ZOFRAN) injection 4 mg (not administered)  0.9 %  sodium  chloride infusion ( Intravenous Restarted 04/20/17 0139)     Initial Impression / Assessment and Plan / ED Course  I have reviewed the triage vital signs and the nursing notes.  Pertinent labs & imaging results that were available during my care of the patient were reviewed by me and considered in my medical decision making (see chart for details).     83 yo M with indwelling catheter presents with bleeding surrounding the urethral meatus and intermittent hematuria. Fully appears to be intact and patient is able to drain without any difficulty. He is urine appears to be tea colored in the department. No evidence of clots. Records reviewed show the patient has a history of thrombocytopenia. Discussed with patient. He was referred to the [facility] because of his repeated low platelet counts over the last few months. Patient saw Dr. Irene Limbo the first time today. Labs at that time were drawn into the patient was pancytopenic with platelets at 32. Given the patient has continued to have bleeding, we'll repeat labs today for reevaluation. Labs including CBC, BMP, PTT/INR, and screen ordered.  Labs reviewed. Platelet count is 26 which is a drop from 30 to previously. H&H is stable at 9.0/27.4. Records reviewed show that is consistent with prior levels. Patient does have decrease in white blood cells and red blood cells and is pancytopenic. INR is 1.04.   Discussed with Dr. Diona Fanti (Urology) he was in the department. Recommends that foley stay in place if he is still able to drain and does not have any clots. Recommend applying perineal pressure to help with bleeding.  Discussed with Dr. Learta Codding (Oncology). Recommends transfusing 1 unit of platelets given patient's low platelet count and bleeding.  Given that patient needs transfusion, we'll plan to admit. Consult hospitalist place.   Transfusion of platelets initiated.  Discussed with hospitalist will admit.   Final  Clinical Impressions(s) / ED Diagnoses  Final diagnoses:  Pancytopenia ([place])  Thrombocytopenia [facility])    New Prescriptions Current Discharge Medication List       Desma Mcgregor 04/20/17 2979    Tanna Furry, MD 04/30/17 [phone]

## 2017-04-19 NOTE — ED Notes (Signed)
Bladder Scan: 17ML

## 2017-04-19 NOTE — ED Notes (Signed)
EDPA Provider at bedside. 

## 2017-04-19 NOTE — Patient Instructions (Signed)
Thank you for choosing [facility] to provide your oncology and hematology care.  To afford each patient quality time with our providers, please arrive 30 minutes before your scheduled appointment time.  If you arrive late for your appointment, you may be asked to reschedule.  We strive to give you quality time with our providers, and arriving late affects you and other patients whose appointments are after yours.  If you are a no show for multiple scheduled visits, you may be dismissed from the clinic at the providers discretion.   Again, thank you for choosing [facility], our hope is that these requests will decrease the amount of time that you wait before being seen by our physicians.  ______________________________________________________________________ Should you have questions after your visit to the [facility], please contact our office at [phone] between the hours of 8:30 and 4:30 p.m.    Voicemails left after 4:30p.m will not be returned until the following business day.   For prescription refill requests, please have your pharmacy contact us directly.  Please also try to allow 48 hours for prescription requests.   Please contact the scheduling department for questions regarding scheduling.  For scheduling of procedures such as PET scans, CT scans, MRI, Ultrasound, etc please contact central scheduling at [phone].   Resources For Cancer Patients and Caregivers:  American Cancer Society:  800-227-2345  Can help patients locate various types of support and financial assistance Cancer Care: 1-800-813-HOPE (4673) Provides financial assistance, online support groups, medication/co-pay assistance.   [place] DSS:  336-641-3447 Where to apply for food stamps, Medicaid, and utility assistance Medicare Rights Center: 800-333-4114 Helps people with Medicare understand their rights and benefits, navigate the Medicare system, and secure the  quality healthcare they deserve SCAT: 336-333-6589 [place] [employer]'s shared-ride transportation service for eligible riders who have a disability that prevents them from riding the fixed route bus.   For additional information on assistance programs please contact our social worker:   Grier Hock/Abigail Elmore:  [phone] 

## 2017-04-19 NOTE — ED Notes (Signed)
Bed: [facility] Expected date:  Expected time:  Means of arrival:  Comments: EMS bleeding from foley

## 2017-04-19 NOTE — Telephone Encounter (Signed)
Appointments scheduled per 04/19/17 los. °Patient was given a copy of the AVS report and appointment schedule per 04/19/17 los. °

## 2017-04-19 NOTE — ED Notes (Signed)
Bed: [facility]16 Expected date:  Expected time:  Means of arrival:  Comments: [facility]  

## 2017-04-19 NOTE — Progress Notes (Signed)
[facility]    HEMATOLOGY/ONCOLOGY CONSULTATION NOTE  Date of Service: 04/19/2017  Patient Care Team: [name], MD as PCP - General (Internal Medicine) Norma Fredrickson, MD as Consulting Physician (Psychiatry)  CHIEF COMPLAINTS/PURPOSE OF CONSULTATION:  pancytopenia  HISTORY OF PRESENTING ILLNESS:   Jordan Terry is a wonderful 83 y.o. male who has been referred to [place] by Dr .[name], MD/ Dr Harriett Sine MD for evaluation and management of pancytopenia.  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  He recently transferred his dermatology care to Dr. Elvera Lennox in [place] and had recent labs on 03/29/2017 which showed pancytopenia with a hemoglobin of 10.7 with an MCV of 82.5, platelet count of 45k , WBC count of 2.1k with an [place] of 924.  He reports that he has been off methotrexate for 2 months. Notes no other new medications. No recent symptoms suggestive of a viral infection. No overt fevers chills or night sweats. Does note some fatigue. Denies any previous history of liver disease. No recent infections requiring antibiotics. No new focal bone pain.  MEDICAL HISTORY:  Past Medical History:  Diagnosis Date  . Anginal pain ([place]) 20 years ago  . Anxiety   . Arthritis   . Bilateral tinnitus    wears hearing aides  . Cancer [facility])    prostate  . Grover's disease   . Headache(784.0)    not since retired  . Renal disorder   . Vertigo     SURGICAL HISTORY: Past Surgical History:  Procedure Laterality Date  . CARDIAC CATHETERIZATION  "20 years ago"  . CARPAL TUNNEL RELEASE Left 2008  . CATARACT EXTRACTION Bilateral 2014  . CYSTOSCOPY WITH URETHRAL DILATATION N/A 08/14/2013   Procedure: CYSTOSCOPY WITH URETHRAL DILATATION BALLOON DILATION OF URETHRAL  STRICTURE;  Surgeon: Dutch Gray, MD;  Location: [facility];  Service: Urology;  Laterality: N/A;  BALLOON DILATION   . CYSTOSCOPY WITH URETHRAL DILATATION N/A 05/07/2014   Procedure: CYSTOSCOPY WITH BALLOON DILATION OF URETHRAL STRICTURE;  Surgeon: Raynelle Bring, MD;  Location: [facility];  Service: Urology;  Laterality: N/A;  [place]   "scope"  . NOSE SURGERY  1991, 1998  . RADIOACTIVE SEED IMPLANT  2002   "for prostate cancer"  . SHOULDER SURGERY  2012   right  . THYROIDECTOMY, PARTIAL  1974  . VASECTOMY  1970    SOCIAL HISTORY: Social History   Social History  . Marital status: Married    Spouse name: [name]  . Number of children: 3  . Years of education: college   Occupational History  . Not on file.   Social History Main Topics  . Smoking status: Never Smoker  . Smokeless tobacco: Never Used  . Alcohol use No  . Drug use: No  . Sexual activity: Not on file   Other Topics Concern  . Not on file   Social History Narrative   Patient is married [name]) and lives at home with his wife.   Patient has three children.   Patient is retired.   Patient has a college education in [place].   Patient is right-handed.   Patient drinks one cup of coffee daily and one 8 oz of tea daily.    FAMILY HISTORY: Family History  Problem Relation Age of Onset  . Anxiety disorder Mother   . Cancer Father        colon, lung  . Diabetes Brother   . Cancer Brother   . Diabetes Sister   . Leukemia Sister   . Cancer Sister        breast  . Cancer Sister        breast  . Cancer Sister        thyroid  . Diabetes Sister     ALLERGIES:  is allergic to benadryl [diphenhydramine hcl] and hydrocodone.  MEDICATIONS:  Current Outpatient Prescriptions  Medication Sig Dispense Refill  . folic acid (FOLVITE) 1 MG tablet Take 1 mg by mouth daily. Six days a week. Pt takes every day but on sat.    . gabapentin (NEURONTIN) 100 MG capsule  Take 200 mg by mouth 3 (three) times daily.     Marland Kitchen LORazepam (ATIVAN) 0.5 MG tablet Take 1 mg by mouth at bedtime.    . methotrexate 2.5 MG tablet Take 12.5 mg by mouth every Saturday.     . nortriptyline (PAMELOR) 25 MG capsule Take 1 capsule (25 mg total) by mouth at bedtime. 90 capsule 1  . oxyCODONE-acetaminophen (PERCOCET) 7.5-325 MG per tablet Take 0.5-1 tablets by mouth every 8 (eight) hours as needed (pain.).     Marland Kitchen temazepam (RESTORIL) 15 MG capsule Take 15 mg by mouth at bedtime.    . traMADol (ULTRAM) 50 MG tablet Take 50 mg by mouth every 6 (six) hours as needed for moderate pain.     Marland Kitchen triamcinolone cream (KENALOG) 0.1 % Apply 1 application topically every morning. APPLY TO CHEST AND BACK EVERY MORNING FOR [place] DISEASE     No current facility-administered medications for this visit.     REVIEW OF SYSTEMS:    10 Point review of Systems was done is negative except as noted above.  PHYSICAL EXAMINATION: ECOG PERFORMANCE STATUS: 2 - Symptomatic, <50% confined to bed  . Vitals:   04/19/17 1117  BP: 129/67  Pulse: 79  Resp: 18  Temp: 98.7 F (37.1 C)   Filed Weights   04/19/17 1117  Weight: 170 lb (77.1 kg)   .Body mass index is 21.83 kg/m.  GENERAL:alert, in no acute distress and comfortable SKIN: no acute rashes, no significant lesions EYES: conjunctiva are pink and non-injected, sclera anicteric OROPHARYNX: MMM, no exudates, no oropharyngeal erythema or ulceration NECK: supple, no JVD LYMPH:  no palpable lymphadenopathy in the cervical, axillary or inguinal regions LUNGS: clear to auscultation b/l with normal respiratory effort HEART: regular rate & rhythm  ABDOMEN:  normoactive bowel sounds , non tender, not distended. Borderline palpable spleen no overt palpable hepatomegaly. Extremity: no pedal edema PSYCH: alert & oriented x 3 with fluent speech NEURO: no focal motor/sensory deficits  LABORATORY DATA:  I have reviewed the data as listed  . CBC Latest  Ref Rng & Units 03/12/2016 03/11/2016 03/10/2016  WBC 4.0 - 10.5 K/uL 5.3 5.6 7.1  Hemoglobin 13.0 - 17.0 g/dL 9.8(L) 9.8(L) 9.4(L)  Hematocrit 39.0 - 52.0 % 30.2(L) 30.5(L) 29.4(L)  Platelets 150 - 400 K/uL 101(L) 82(L) 64(L)    . CMP Latest Ref Rng & Units 03/12/2016 03/11/2016 03/10/2016  Glucose 65 - 99 mg/dL 106(H) 100(H) 106(H)  BUN 6 - 20 mg/dL _0 Creatinine 0.61 - 1.24 mg/dL 0.93 1.00 1.07  Sodium 135 - 145 mmol/L 139 137 134(L)  Potassium 3.5 - 5.1 mmol/L 3.8 3.9 3.7  Chloride 101 - 111 mmol/L 107 104 105  CO2 22 - 32 mmol/L _0 Calcium 8.9 - 10.3 mg/dL 8.3(L) 8.1(L) 7.7(L)  Total Protein 6.5 - 8.1 g/dL - 6.1(L) -  Total Bilirubin 0.3 - 1.2 mg/dL - 0.5 -  Alkaline Phos 38 - 126 U/L - 61 -  AST 15 - 41 U/L - 20 -  ALT 17 - 63 U/L - 25 -   Component     Latest Ref Rng & Units 04/19/2017  WBC     4.0 - 10.3 10e3/uL 2.5 (L)  NEUT#     1.5 - 6.5 10e3/uL 1.3 (L)  Hemoglobin     13.0 - 17.1 g/dL 9.8 (L)  HCT     38.4 - 49.9 % 30.8 (L)  Platelets     140 - 400 10e3/uL 32 (L)  MCV     79.3 - 98.0 fL 86.0  MCH     27.2 - 33.4 pg 27.4  MCHC     32.0 - 36.0 g/dL 31.8 (L)  RBC     4.20 - 5.82 10e6/uL 3.58 (L)  RDW     11.0 - 14.6 % 20.6 (H)  lymph#     0.9 - 3.3 10e3/uL 1.1  MONO#     0.1 - 0.9 10e3/uL 0.1  Eosinophils Absolute     0.0 - 0.5 10e3/uL 0.1  Basophils Absolute     0.0 - 0.1 10e3/uL 0.0  NEUT%     39.0 - 75.0 % 50.1  LYMPH%     14.0 - 49.0 % 41.9  MONO%     0.0 - 14.0 % 4.0  EOS%     0.0 - 7.0 % 3.6  BASO%     0.0 - 2.0 % 0.4  Retic %     0.80 - 1.80 % 1.23  Retic Ct Abs     34.80 - 93.90 10e3/uL 44.03  Immature Retic Fract     3.00 - 10.60 % 14.10 (H)  Sodium     136 - 145 mEq/L 143  Potassium     3.5 - 5.1 mEq/L 4.0  Chloride     98 - 109 mEq/L 103  CO2     22 - 29 mEq/L 29  Glucose     70 - 140 mg/dl 93  BUN     7.0 - 26.0 mg/dL 16.2  Creatinine     0.7 - 1.3 mg/dL 1.1  Total Bilirubin     0.20 - 1.20 mg/dL 0.59    Alkaline Phosphatase     40 - 150 U/L 60  AST     5 - 34 U/L 19  ALT     0 - 55 U/L 25  Total Protein     6.4 - 8.3 g/dL 7.5  Albumin     3.5 - 5.0 g/dL 3.9  Calcium     8.4 - 10.4 mg/dL 9.5  Anion gap     3 - 11 mEq/L 11  EGFR     >90 ml/min/1.73 m2 65 (L)  LDH     125 - 245 U/L 191  Sed Rate     0 - 30 mm/hr 19  Vitamin B12     232 - 1245 pg/mL >2000 (H)  HIV     Non Reactive Non Reactive  Hep C Virus Ab     0.0 - 0.9 s/co ratio <0.1    RADIOGRAPHIC STUDIES: I have personally reviewed the radiological images  as listed and agreed with the findings in the report. No results found.  ASSESSMENT & PLAN:   83 year old gentleman with   #1 Progressive pancytopenia. Patient was previously on methotrexate but notes that he has been off of this for 2 months And continues to have pancytopenia. He has been on folic acid replacement as well.  Vitamin B12 and folate levels within normal limits. HIV nonreactive Hepatitis C negative LDH within normal limits at 191  #2 thrombocytopenia platelet count of 32k #3 neutropenia WBC count of 2.5k with an ANC of 1.3k #4 normocytic anemia hemoglobin of 9.8 with an MCV of 86. 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  Labs today RTC with Dr Irene Limbo in 3-4 weeks (first available after PAL  All of the patients questions were answered with apparent satisfaction. The patient knows to call the clinic with any problems, questions or concerns.  I spent 60 minutes counseling the patient face to face. The total time spent in the appointment was 80 minutes and more than 50% was on counseling and direct patient cares.    Sullivan Lone MD Accident AAHIVMS [facility] Hematology/Oncology Physician [facility]  (Office):       [phone] (Work cell):  [phone] (Fax):            [phone]  04/19/2017 11:34 AM

## 2017-04-19 NOTE — ED Triage Notes (Signed)
Per [facility]. Pt resides at home. HX of prostate Ca. Difficult placement of foley 1 week ago of foley cath by [facility]. Bleeding with clots then. Pt states this improved. Pt states bleeding today around meatus and in foley. Pt denies urinary retention and pain with urinating. Pt states bleeding has increased. Pt denies fever, N/V/D.

## 2017-04-20 ENCOUNTER — Inpatient Hospital Stay (HOSPITAL_COMMUNITY): Payer: Medicare Other

## 2017-04-20 ENCOUNTER — Encounter (HOSPITAL_COMMUNITY): Payer: Self-pay | Admitting: Radiology

## 2017-04-20 DIAGNOSIS — B961 Klebsiella pneumoniae [K. pneumoniae] as the cause of diseases classified elsewhere: Secondary | ICD-10-CM | POA: Diagnosis present

## 2017-04-20 DIAGNOSIS — Z8546 Personal history of malignant neoplasm of prostate: Secondary | ICD-10-CM | POA: Diagnosis not present

## 2017-04-20 DIAGNOSIS — D61818 Other pancytopenia: Secondary | ICD-10-CM | POA: Diagnosis present

## 2017-04-20 DIAGNOSIS — L111 Transient acantholytic dermatosis [Grover]: Secondary | ICD-10-CM | POA: Diagnosis present

## 2017-04-20 DIAGNOSIS — N390 Urinary tract infection, site not specified: Secondary | ICD-10-CM | POA: Diagnosis not present

## 2017-04-20 DIAGNOSIS — F419 Anxiety disorder, unspecified: Secondary | ICD-10-CM | POA: Diagnosis present

## 2017-04-20 DIAGNOSIS — N368 Other specified disorders of urethra: Secondary | ICD-10-CM | POA: Diagnosis present

## 2017-04-20 DIAGNOSIS — K5900 Constipation, unspecified: Secondary | ICD-10-CM | POA: Diagnosis present

## 2017-04-20 DIAGNOSIS — Z79899 Other long term (current) drug therapy: Secondary | ICD-10-CM | POA: Diagnosis not present

## 2017-04-20 DIAGNOSIS — D696 Thrombocytopenia, unspecified: Secondary | ICD-10-CM | POA: Diagnosis not present

## 2017-04-20 DIAGNOSIS — Z888 Allergy status to other drugs, medicaments and biological substances status: Secondary | ICD-10-CM | POA: Diagnosis not present

## 2017-04-20 DIAGNOSIS — N3091 Cystitis, unspecified with hematuria: Secondary | ICD-10-CM | POA: Diagnosis present

## 2017-04-20 DIAGNOSIS — Z87891 Personal history of nicotine dependence: Secondary | ICD-10-CM | POA: Diagnosis not present

## 2017-04-20 DIAGNOSIS — C8590 Non-Hodgkin lymphoma, unspecified, unspecified site: Secondary | ICD-10-CM | POA: Diagnosis present

## 2017-04-20 DIAGNOSIS — B965 Pseudomonas (aeruginosa) (mallei) (pseudomallei) as the cause of diseases classified elsewhere: Secondary | ICD-10-CM | POA: Diagnosis present

## 2017-04-20 DIAGNOSIS — C61 Malignant neoplasm of prostate: Secondary | ICD-10-CM | POA: Diagnosis not present

## 2017-04-20 LAB — CBC
HCT: 25.8 % — ABNORMAL LOW (ref 39.0–52.0)
Hemoglobin: 8.4 g/dL — ABNORMAL LOW (ref 13.0–17.0)
MCH: 27.7 pg (ref 26.0–34.0)
MCHC: 32.6 g/dL (ref 30.0–36.0)
MCV: 85.1 fL (ref 78.0–100.0)
PLATELETS: 55 10*3/uL — AB (ref 150–400)
RBC: 3.03 MIL/uL — ABNORMAL LOW (ref 4.22–5.81)
RDW: 20.4 % — ABNORMAL HIGH (ref 11.5–15.5)
WBC: 2.2 10*3/uL — ABNORMAL LOW (ref 4.0–10.5)

## 2017-04-20 LAB — BASIC METABOLIC PANEL
Anion gap: 5 (ref 5–15)
BUN: 15 mg/dL (ref 6–20)
CALCIUM: 8.5 mg/dL — AB (ref 8.9–10.3)
CO2: 31 mmol/L (ref 22–32)
CREATININE: 1 mg/dL (ref 0.61–1.24)
Chloride: 106 mmol/L (ref 101–111)
GFR calc non Af Amer: >60 mL/min (ref >60)
Glucose, Bld: 107 mg/dL — ABNORMAL HIGH (ref 65–99)
Potassium: 4.1 mmol/L (ref 3.5–5.1)
Sodium: 142 mmol/L (ref 135–145)

## 2017-04-20 LAB — PREPARE PLATELET PHERESIS: UNIT DIVISION: 0

## 2017-04-20 LAB — SEDIMENTATION RATE: Sedimentation Rate-Westergren: 19 mm/hr (ref 0–30)

## 2017-04-20 LAB — BPAM PLATELET PHERESIS
Blood Product Expiration Date: 201806272359
ISSUE DATE / TIME: 201806252338
Unit Type and Rh: 6200

## 2017-04-20 LAB — HEPATITIS C ANTIBODY: Hep C Virus Ab: <0.1 s/co ratio (ref 0.0–0.9)

## 2017-04-20 LAB — HIV ANTIBODY (ROUTINE TESTING W REFLEX): HIV Screen 4th Generation wRfx: NONREACTIVE

## 2017-04-20 LAB — VITAMIN B12

## 2017-04-20 MED ORDER — IOPAMIDOL (ISOVUE-300) INJECTION 61%
30.0000 mL | Freq: Once | INTRAVENOUS | Status: AC | PRN
  Administered 2017-04-20: 30 mL via ORAL

## 2017-04-20 MED ORDER — DEXTROSE 5 % IV SOLN
1.0000 g | INTRAVENOUS | Status: DC
  Administered 2017-04-20 – 2017-04-22 (×3): 1 g via INTRAVENOUS
  Filled 2017-04-20 (×3): qty 10

## 2017-04-20 MED ORDER — POLYETHYLENE GLYCOL 3350 17 G PO PACK
17.0000 g | PACK | Freq: Every day | ORAL | Status: DC
  Administered 2017-04-20 – 2017-04-22 (×3): 17 g via ORAL
  Filled 2017-04-20 (×3): qty 1

## 2017-04-20 MED ORDER — IOPAMIDOL (ISOVUE-300) INJECTION 61%
100.0000 mL | Freq: Once | INTRAVENOUS | Status: AC | PRN
  Administered 2017-04-20: 100 mL via INTRAVENOUS

## 2017-04-20 MED ORDER — DOCUSATE SODIUM 100 MG PO CAPS
100.0000 mg | ORAL_CAPSULE | Freq: Every day | ORAL | Status: DC
  Administered 2017-04-20 – 2017-04-22 (×3): 100 mg via ORAL
  Filled 2017-04-20 (×3): qty 1

## 2017-04-20 MED ORDER — IOPAMIDOL (ISOVUE-300) INJECTION 61%
INTRAVENOUS | Status: AC
  Filled 2017-04-20: qty 30

## 2017-04-20 MED ORDER — IOPAMIDOL (ISOVUE-300) INJECTION 61%
INTRAVENOUS | Status: AC
  Filled 2017-04-20: qty 100

## 2017-04-20 NOTE — Consult Note (Signed)
Chief Complaint: Patient was seen in consultation today for CT-guided bone marrow biopsy Chief Complaint  Patient presents with  . Hematuria  . FOLEY    DIFFICULT PLACEMENT BY [facility] 1 WEEK AGO    Referring Physician(s): [place]  Supervising Physician: Arne Cleveland  Patient Status: [facility] - In-pt  History of Present Illness: Jordan Terry is a 83 y.o. male with history of prostate cancer in remission and urinary retention (recent Foley placement) who presented to the ED on 6/25 with urethral bleeding, constipation,?UTI. Subsequent lab evaluation revealed pancytopenia of unknown etiology. Request now received from oncology for CT-guided bone marrow biopsy for further evaluation.  Past Medical History:  Diagnosis Date  . Anginal pain ([place]) 20 years ago  . Anxiety   . Arthritis   . Bilateral tinnitus    wears hearing aides  . Cancer [facility])    prostate  . Grover's disease   . Headache(784.0)    not since retired  . Hematuria 04/14/2017  . Self-catheterizes urinary bladder 04/19/2017  . Vertigo     Past Surgical History:  Procedure Laterality Date  . CARDIAC CATHETERIZATION  "20 years ago"  . CARPAL TUNNEL RELEASE Left 2008  . CATARACT EXTRACTION Bilateral 2014  . CYSTOSCOPY WITH URETHRAL DILATATION N/A 08/14/2013   Procedure: CYSTOSCOPY WITH URETHRAL DILATATION BALLOON DILATION OF URETHRAL STRICTURE;  Surgeon: Dutch Gray, MD;  Location: [facility];  Service: Urology;  Laterality: N/A;  BALLOON DILATION   . CYSTOSCOPY WITH URETHRAL DILATATION N/A 05/07/2014   Procedure: CYSTOSCOPY WITH BALLOON DILATION OF URETHRAL STRICTURE;  Surgeon: Raynelle Bring, MD;  Location: [facility];  Service: Urology;  Laterality: N/A;  [place]   "scope"  . NOSE SURGERY  1991, 1998  . RADIOACTIVE SEED IMPLANT  2002   "for prostate cancer"  . SHOULDER SURGERY  2012   right  . THYROIDECTOMY, PARTIAL  1974  . VASECTOMY  1970     Allergies: Benadryl [diphenhydramine hcl] and Hydrocodone  Medications: Prior to Admission medications   Medication Sig Start Date End Date Taking? Authorizing Provider  gabapentin (NEURONTIN) 100 MG capsule Take 200 mg by mouth 3 (three) times daily.    Yes [provider]  LORazepam (ATIVAN) 0.5 MG tablet Take 1 mg by mouth at bedtime.   Yes [provider]  nortriptyline (PAMELOR) 25 MG capsule Take 1 capsule (25 mg total) by mouth at bedtime. 01/19/17  Yes Dennie Bible, NP  oxyCODONE-acetaminophen (PERCOCET) 7.5-325 MG per tablet Take 0.5-1 tablets by mouth every 8 (eight) hours as needed (pain.).    Yes [provider]  temazepam (RESTORIL) 15 MG capsule Take 15 mg by mouth at bedtime.   Yes [provider]  traMADol (ULTRAM) 50 MG tablet Take 50 mg by mouth every 6 (six) hours as needed for moderate pain.    Yes [provider]  triamcinolone cream (KENALOG) 0.1 % Apply 1 application topically every morning. APPLY TO CHEST AND BACK EVERY MORNING FOR [place] DISEASE   Yes [provider]     Family History  Problem Relation Age of Onset  . Anxiety disorder Mother   . Cancer Father        colon, lung  . Diabetes Brother   . Cancer Brother   . Diabetes Sister   . Leukemia Sister   . Cancer Sister        breast  . Cancer  Sister        breast  . Cancer Sister        thyroid  . Diabetes Sister     Social History   Social History  . Marital status: Married    Spouse name: [name]  . Number of children: 3  . Years of education: college   Social History Main Topics  . Smoking status: Former Smoker    Years: 10.00    Types: Cigars    Quit date: 1979  . Smokeless tobacco: Never Used  . Alcohol use No  . Drug use: No  . Sexual activity: Not Asked   Other Topics Concern  . 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   Patient has three children.   Patient  is retired.   Patient has a college education in [place].   Patient is right-handed.   Patient drinks one cup of coffee daily and one 8 oz of tea daily.      Review of Systems see above; currently denies fever, headache, dyspnea, cough, abdominal pain, nausea, vomiting; does have some intermittent chest/?anginal discomfort, especially when lying on back. He is hard of hearing.  Vital Signs: BP 113/65 (BP Location: Right Arm)   Pulse 78   Temp 98.2 F (36.8 C) (Oral)   Resp 18   Ht _0  (1.88 m)   Wt 170 lb 6.7 oz (77.3 kg)   SpO2 98%   BMI 21.88 kg/m   Physical Exam awake, alert. Chest clear to auscultation bilaterally. Heart with regular rate and rhythm. Abdomen soft, positive bowel sounds, nontender. No lower extremity edema. Foley with yellow urine.  Mallampati Score:     Imaging: No results found.  Labs:  CBC:  Recent Labs  04/19/17 1254 04/19/17 1803 04/20/17 0456  WBC 2.5* 2.1* 2.2*  HGB 9.8* 9.0* 8.4*  HCT 30.8* 27.4* 25.8*  PLT 32* 26* 55*    COAGS:  Recent Labs  04/19/17 1803  INR 1.04    BMP:  Recent Labs  04/19/17 1254 04/19/17 1803 04/20/17 0456  NA 143 139 142  K 4.0 3.6 4.1  CL  --  103 106  CO2 _1 GLUCOSE 93 158* 107*  BUN 16._2 CALCIUM 9.5 8.5* 8.5*  CREATININE 1.1 1.01 1.00  GFRNONAA  --  >60 >60  GFRAA  --  >60 >60    LIVER FUNCTION TESTS:  Recent Labs  04/19/17 1254  BILITOT 0.59  AST 19  ALT 25  ALKPHOS 60  PROT 7.5  ALBUMIN 3.9    TUMOR MARKERS: No results for input(s): AFPTM, CEA, CA199, CHROMGRNA in the last 8760 hours.  Assessment and Plan: 83 y.o. male with history of prostate cancer in remission and urinary retention (recent Foley placement) who presented to the ED on 6/25 with urethral bleeding, constipation,?UTI. Subsequent lab evaluation revealed pancytopenia of unknown etiology. Request now received from oncology for CT-guided bone marrow biopsy for further evaluation.Risks and  benefits discussed with the patient including, but not limited to bleeding, infection, damage to adjacent structures or low yield requiring additional tests.All of the patient's questions were answered, patient is agreeable to proceed.Consent signed and in chart. Procedure tentatively scheduled for 6/27.     Thank you for this interesting consult.  I greatly enjoyed meeting Jordan Terry and look forward to participating in their care.  A copy of this report was sent to  the requesting provider on this date.  Electronically Signed: D. Rowe Robert, PA-C 04/20/2017, 1:34 PM   I spent a total of  20 minutes   in face to face in clinical consultation, greater than 50% of which was counseling/coordinating care for CT-guided bone marrow biopsy

## 2017-04-20 NOTE — Progress Notes (Deleted)
PROGRESS NOTE    Jordan Terry  JQG:[id number] DOB: Aug 11, 1932 DOA: 04/19/2017 PCP: [name], MD     Brief Narrative:  Jordan Terry is a 83 y.o. male with history of prostate cancer in remission who had come to the ER last week with urinary retention and had Foley catheter placement. He presented to the ER with persistent bleeding per urethra over the last few days. He has been following with Dr. Alinda Money with [facility], and recently saw Dr. Irene Limbo due to his pancytopenia. Due to his bleeding and thrombocytopenia, oncologist recommended platelet transfusion.   Assessment & Plan:   Principal Problem:   Urethral bleeding Active Problems:   Prostate cancer (HCC)   Pancytopenia (HCC)   Urethral bleeding -Hx prostate cancer in remission, recent urinary retention and Foley catheter placement -Urology Dr. Diona Fanti notified by ED, he has recommended keeping foley in place if still able to drain, recommend applying perineal pressure to help with bleeding  -Has now resolved, Foley bag with yellow urine this morning, no blood around the meatus  Pancytopenia -Has been on methotrexate until recently -Received platelet transfusion last night -Consulted Dr. Irene Limbo, need bone marrow biopsy -Trend CBC   UTI, POA, related to chronic foley -Urine culture pending, empiric rocephin   Constipation -Colace, miralax ordered   Anxiety -Ativan, pamelor qhs     DVT prophylaxis: SCD Code Status: Full Family Communication: No family at bedside Disposition Plan: pending further evaluation   Consultants:   Oncology  Procedures:   None  Antimicrobials:  Anti-infectives    Start     Dose/Rate Route Frequency Ordered Stop   04/20/17 1400  cefTRIAXone (ROCEPHIN) 1 g in dextrose 5 % 50 mL IVPB     1 g 100 mL/hr over 30 Minutes Intravenous Every 24 hours 04/20/17 1237         Subjective: Patient feeling well this morning. No new complaints, no new  bleed.  Objective: Vitals:   04/20/17 0005 04/20/17 0134 04/20/17 0617 04/20/17 1405  BP: 117/65 122/72 113/65 111/76  Pulse: 74 78 78 88  Resp: '16 20 18 18  ' Temp: 98.4 F (36.9 C) 98 F (36.7 C) 98.2 F (36.8 C) 98.2 F (36.8 C)  TempSrc: Oral Oral Oral Oral  SpO2: 97% 99% 98% 99%  Weight:      Height:        Intake/Output Summary (Last 24 hours) at 04/20/17 1749 Last data filed at 04/20/17 1344  Gross per 24 hour  Intake          2161.25 ml  Output             1950 ml  Net           211.25 ml   Filed Weights   04/19/17 2209  Weight: 77.3 kg (170 lb 6.7 oz)    Examination:  General exam: Appears calm and comfortable  Respiratory system: Clear to auscultation. Respiratory effort normal. Cardiovascular system: S1 & S2 heard, RRR. No JVD, murmurs, rubs, gallops or clicks. No pedal edema. Gastrointestinal system: Abdomen is nondistended, soft and nontender. No organomegaly or masses felt. Normal bowel sounds heard. GU: foley bag with yellow urine, penile meatus with dried old blood but no acute bleeding seen Central nervous system: Alert and oriented. No focal neurological deficits. Extremities: Symmetric 5 x 5 power. Skin: No rashes, lesions or ulcers Psychiatry: Judgement and insight appear normal. Mood & affect appropriate.   Data Reviewed: I have personally reviewed following labs and  imaging studies  CBC:  Recent Labs Lab 04/19/17 1254 04/19/17 1803 04/20/17 0456  WBC 2.5* 2.1* 2.2*  NEUTROABS 1.3* 1.2*  --   HGB 9.8* 9.0* 8.4*  HCT 30.8* 27.4* 25.8*  MCV 86.0 82.8 85.1  PLT 32* 26* 55*   Basic Metabolic Panel:  Recent Labs Lab 04/19/17 1254 04/19/17 1803 04/20/17 0456  NA 143 139 142  K 4.0 3.6 4.1  CL  --  103 106  CO2 '29 28 31  ' GLUCOSE 93 158* 107*  BUN 16.'2 17 15  ' CREATININE 1.1 1.01 1.00  CALCIUM 9.5 8.5* 8.5*   GFR: Estimated Creatinine Clearance: 62.3 mL/min (by C-G formula based on SCr of 1 mg/dL). Liver Function Tests:  Recent  Labs Lab 04/19/17 1254  AST 19  ALT 25  ALKPHOS 60  BILITOT 0.59  PROT 7.5  ALBUMIN 3.9   No results for input(s): LIPASE, AMYLASE in the last 168 hours. No results for input(s): AMMONIA in the last 168 hours. Coagulation Profile:  Recent Labs Lab 04/19/17 1803  INR 1.04   Cardiac Enzymes: No results for input(s): CKTOTAL, CKMB, CKMBINDEX, TROPONINI in the last 168 hours. BNP (last 3 results) No results for input(s): PROBNP in the last 8760 hours. HbA1C: No results for input(s): HGBA1C in the last 72 hours. CBG: No results for input(s): GLUCAP in the last 168 hours. Lipid Profile: No results for input(s): CHOL, HDL, LDLCALC, TRIG, CHOLHDL, LDLDIRECT in the last 72 hours. Thyroid Function Tests: No results for input(s): TSH, T4TOTAL, FREET4, T3FREE, THYROIDAB in the last 72 hours. Anemia Panel:  Recent Labs  04/19/17 1254  VITAMINB12 >2000*  RETICCTPCT 1.23   Sepsis Labs: No results for input(s): PROCALCITON, LATICACIDVEN in the last 168 hours.  Recent Results (from the past 240 hour(s))  TECHNOLOGIST REVIEW     Status: None   Collection Time: 04/19/17 12:54 PM  Result Value Ref Range Status   Technologist Review   Final    mod poly and ovalos, few teardrops, schistocytes, acanthos and helmet cells        Radiology Studies: No results found.    Scheduled Meds: . docusate sodium  100 mg Oral Daily  . gabapentin  200 mg Oral TID  . iopamidol      . LORazepam  1 mg Oral QHS  . nortriptyline  25 mg Oral QHS  . polyethylene glycol  17 g Oral Daily  . temazepam  15 mg Oral QHS   Continuous Infusions: . cefTRIAXone (ROCEPHIN)  IV Stopped (04/20/17 1414)     LOS: 0 days    Time spent: 40 minutes   Dessa Phi, DO [facility] [email or web address] Password [email or web address] 04/20/2017, 5:49 PM

## 2017-04-20 NOTE — Progress Notes (Addendum)
PROGRESS NOTE    Jordan Terry  QMG:[id number] DOB: May 17, 1932 DOA: 04/19/2017 PCP: Jordan Battles, MD     Brief Narrative:  Jordan Terry is a 83 y.o. male with history of prostate cancer in remission who had come to the ER last week with urinary retention and had Foley catheter placement. He presented to the ER with persistent bleeding per urethra over the last few days. He has been following with Jordan Terry with [facility], and recently saw Jordan Terry due to his pancytopenia. Due to his bleeding and thrombocytopenia, oncologist recommended platelet transfusion.   Assessment & Plan:   Principal Problem:   Urethral bleeding Active Problems:   Prostate cancer (HCC)   Pancytopenia (HCC)   Urethral bleeding -Hx prostate cancer in remission, recent urinary retention and Foley catheter placement -Urology Jordan Terry notified by ED, he has recommended keeping foley in place if still able to drain, recommend applying perineal pressure to help with bleeding  -Has now resolved, Foley bag with yellow urine this morning, no blood around the meatus  Pancytopenia -Has been on methotrexate until recently -Received platelet transfusion last night -Consulted Jordan Terry, need bone marrow biopsy -Trend CBC   UTI, POA, related to chronic foley -Urine culture pending, empiric rocephin   Constipation -Colace, miralax ordered   Anxiety -Ativan, pamelor qhs     DVT prophylaxis: SCD Code Status: Full Family Communication: No family at bedside Disposition Plan: pending further evaluation   Consultants:   Oncology  Procedures:   None  Antimicrobials:  Anti-infectives    Start     Dose/Rate Route Frequency Ordered Stop   04/20/17 1245  cefTRIAXone (ROCEPHIN) 1 g in dextrose 5 % 50 mL IVPB     1 g 100 mL/hr over 30 Minutes Intravenous Every 24 hours 04/20/17 1237         Subjective: Patient feeling well this morning. No new complaints, no new  bleed.  Objective: Vitals:   04/19/17 2337 04/20/17 0005 04/20/17 0134 04/20/17 0617  BP: 113/63 117/65 122/72 113/65  Pulse: 79 74 78 78  Resp: _0 Temp: 98.4 F (36.9 C) 98.4 F (36.9 C) 98 F (36.7 C) 98.2 F (36.8 C)  TempSrc: Oral Oral Oral Oral  SpO2: 99% 97% 99% 98%  Weight:      Height:        Intake/Output Summary (Last 24 hours) at 04/20/17 1238 Last data filed at 04/20/17 1217  Gross per 24 hour  Intake          1871.25 ml  Output             1950 ml  Net           -78.75 ml   Filed Weights   04/19/17 2209  Weight: 77.3 kg (170 lb 6.7 oz)    Examination:  General exam: Appears calm and comfortable  Respiratory system: Clear to auscultation. Respiratory effort normal. Cardiovascular system: S1 & S2 heard, RRR. No JVD, murmurs, rubs, gallops or clicks. No pedal edema. Gastrointestinal system: Abdomen is nondistended, soft and nontender. No organomegaly or masses felt. Normal bowel sounds heard. GU: foley bag with yellow urine, penile meatus with dried old blood but no acute bleeding seen Central nervous system: Alert and oriented. No focal neurological deficits. Extremities: Symmetric 5 x 5 power. Skin: No rashes, lesions or ulcers Psychiatry: Judgement and insight appear normal. Mood & affect appropriate.   Data Reviewed: I have personally reviewed following labs and  imaging studies  CBC:  Recent Labs Lab 04/19/17 1254 04/19/17 1803 04/20/17 0456  WBC 2.5* 2.1* 2.2*  NEUTROABS 1.3* 1.2*  --   HGB 9.8* 9.0* 8.4*  HCT 30.8* 27.4* 25.8*  MCV 86.0 82.8 85.1  PLT 32* 26* 55*   Basic Metabolic Panel:  Recent Labs Lab 04/19/17 1254 04/19/17 1803 04/20/17 0456  NA 143 139 142  K 4.0 3.6 4.1  CL  --  103 106  CO2 _0 GLUCOSE 93 158* 107*  BUN 16._1 CREATININE 1.1 1.01 1.00  CALCIUM 9.5 8.5* 8.5*   GFR: Estimated Creatinine Clearance: 62.3 mL/min (by C-G formula based on SCr of 1 mg/dL). Liver Function Tests:  Recent  Labs Lab 04/19/17 1254  AST 19  ALT 25  ALKPHOS 60  BILITOT 0.59  PROT 7.5  ALBUMIN 3.9   No results for input(s): LIPASE, AMYLASE in the last 168 hours. No results for input(s): AMMONIA in the last 168 hours. Coagulation Profile:  Recent Labs Lab 04/19/17 1803  INR 1.04   Cardiac Enzymes: No results for input(s): CKTOTAL, CKMB, CKMBINDEX, TROPONINI in the last 168 hours. BNP (last 3 results) No results for input(s): PROBNP in the last 8760 hours. HbA1C: No results for input(s): HGBA1C in the last 72 hours. CBG: No results for input(s): GLUCAP in the last 168 hours. Lipid Profile: No results for input(s): CHOL, HDL, LDLCALC, TRIG, CHOLHDL, LDLDIRECT in the last 72 hours. Thyroid Function Tests: No results for input(s): TSH, T4TOTAL, FREET4, T3FREE, THYROIDAB in the last 72 hours. Anemia Panel:  Recent Labs  04/19/17 1254  VITAMINB12 >2000*  RETICCTPCT 1.23   Sepsis Labs: No results for input(s): PROCALCITON, LATICACIDVEN in the last 168 hours.  Recent Results (from the past 240 hour(s))  TECHNOLOGIST REVIEW     Status: None   Collection Time: 04/19/17 12:54 PM  Result Value Ref Range Status   Technologist Review   Final    mod poly and ovalos, few teardrops, schistocytes, acanthos and helmet cells        Radiology Studies: No results found.    Scheduled Meds: . docusate sodium  100 mg Oral Daily  . gabapentin  200 mg Oral TID  . LORazepam  1 mg Oral QHS  . nortriptyline  25 mg Oral QHS  . polyethylene glycol  17 g Oral Daily  . temazepam  15 mg Oral QHS   Continuous Infusions: . cefTRIAXone (ROCEPHIN)  IV       LOS: 0 days    Time spent: 40 minutes   Jordan Phi, DO [facility] [email or web address] Password [facility] 04/20/2017, 12:38 PM

## 2017-04-21 ENCOUNTER — Inpatient Hospital Stay (HOSPITAL_COMMUNITY): Payer: Medicare Other

## 2017-04-21 ENCOUNTER — Encounter (HOSPITAL_COMMUNITY): Payer: Self-pay | Admitting: Radiology

## 2017-04-21 DIAGNOSIS — D696 Thrombocytopenia, unspecified: Secondary | ICD-10-CM

## 2017-04-21 DIAGNOSIS — N390 Urinary tract infection, site not specified: Secondary | ICD-10-CM

## 2017-04-21 DIAGNOSIS — C8590 Non-Hodgkin lymphoma, unspecified, unspecified site: Secondary | ICD-10-CM

## 2017-04-21 LAB — BASIC METABOLIC PANEL
Anion gap: 7 (ref 5–15)
BUN: 18 mg/dL (ref 6–20)
CHLORIDE: 103 mmol/L (ref 101–111)
CO2: 29 mmol/L (ref 22–32)
CREATININE: 1.08 mg/dL (ref 0.61–1.24)
Calcium: 8.7 mg/dL — ABNORMAL LOW (ref 8.9–10.3)
GFR calc non Af Amer: >60 mL/min (ref >60)
Glucose, Bld: 104 mg/dL — ABNORMAL HIGH (ref 65–99)
POTASSIUM: 4.1 mmol/L (ref 3.5–5.1)
Sodium: 139 mmol/L (ref 135–145)

## 2017-04-21 LAB — CBC WITH DIFFERENTIAL/PLATELET
Basophils Absolute: 0 10*3/uL (ref 0.0–0.1)
Basophils Relative: 0 %
EOS ABS: 0.1 10*3/uL (ref 0.0–0.7)
Eosinophils Relative: 5 %
HEMATOCRIT: 26.9 % — AB (ref 39.0–52.0)
HEMOGLOBIN: 8.8 g/dL — AB (ref 13.0–17.0)
LYMPHS ABS: 1.2 10*3/uL (ref 0.7–4.0)
LYMPHS PCT: 47 %
MCH: 27.5 pg (ref 26.0–34.0)
MCHC: 32.7 g/dL (ref 30.0–36.0)
MCV: 84.1 fL (ref 78.0–100.0)
MONOS PCT: 4 %
Monocytes Absolute: 0.1 10*3/uL (ref 0.1–1.0)
NEUTROS ABS: 1.1 10*3/uL — AB (ref 1.7–7.7)
NEUTROS PCT: 44 %
Platelets: 52 10*3/uL — ABNORMAL LOW (ref 150–400)
RBC: 3.2 MIL/uL — AB (ref 4.22–5.81)
RDW: 20.1 % — ABNORMAL HIGH (ref 11.5–15.5)
WBC: 2.5 10*3/uL — AB (ref 4.0–10.5)

## 2017-04-21 LAB — FOLATE RBC
Folate, Hemolysate: 320.4 ng/mL
Folate, RBC: 1105 ng/mL
Hematocrit: 29 % — ABNORMAL LOW (ref 37.5–51.0)

## 2017-04-21 MED ORDER — MIDAZOLAM HCL 2 MG/2ML IJ SOLN
INTRAMUSCULAR | Status: AC | PRN
  Administered 2017-04-21 (×2): 1 mg via INTRAVENOUS

## 2017-04-21 MED ORDER — LORAZEPAM 0.5 MG PO TABS
0.5000 mg | ORAL_TABLET | Freq: Every day | ORAL | Status: DC
  Administered 2017-04-21: 0.5 mg via ORAL
  Filled 2017-04-21: qty 1

## 2017-04-21 MED ORDER — SODIUM CHLORIDE 0.9 % IV SOLN
INTRAVENOUS | Status: AC
  Filled 2017-04-21: qty 250

## 2017-04-21 MED ORDER — FENTANYL CITRATE (PF) 100 MCG/2ML IJ SOLN
INTRAMUSCULAR | Status: AC
  Filled 2017-04-21: qty 4

## 2017-04-21 MED ORDER — LIDOCAINE HCL 1 % IJ SOLN
INTRAMUSCULAR | Status: AC | PRN
  Administered 2017-04-21: 10 mL

## 2017-04-21 MED ORDER — LORAZEPAM 1 MG PO TABS: 1.0000 mg | ORAL_TABLET | Freq: Every day | ORAL | Status: DC

## 2017-04-21 MED ORDER — LORAZEPAM 2 MG/ML IJ SOLN
0.5000 mg | Freq: Once | INTRAMUSCULAR | Status: AC
  Administered 2017-04-21: 0.5 mg via INTRAVENOUS
  Filled 2017-04-21: qty 1

## 2017-04-21 MED ORDER — FENTANYL CITRATE (PF) 100 MCG/2ML IJ SOLN
INTRAMUSCULAR | Status: AC | PRN
  Administered 2017-04-21 (×2): 50 ug via INTRAVENOUS

## 2017-04-21 MED ORDER — MIDAZOLAM HCL 2 MG/2ML IJ SOLN
INTRAMUSCULAR | Status: AC
  Filled 2017-04-21: qty 4

## 2017-04-21 MED ORDER — SODIUM CHLORIDE 0.9 % IV SOLN
INTRAVENOUS | Status: AC
  Filled 2017-04-21: qty 1000

## 2017-04-21 NOTE — Procedures (Signed)
   CT-guided  R iliac bone marrow aspiration and core biopsy No complication No blood loss. See complete dictation in [facility] PACS   D. Florella Mcneese MD Main # [phone] Pager  [phone]      

## 2017-04-21 NOTE — Progress Notes (Signed)
PROGRESS NOTE    Jordan Terry  TRZ:[id number] DOB: Jan 31, 1932 DOA: 04/19/2017 PCP: [name], MD   Brief Narrative: Jordan Terry is a 83 y.o. male with history of prostate cancer in remission and recent Foley catheter placement secondary to urinary retention. He presented with bleeding per his urethra. This is likely mediated by his pancytopenia. He received platelet transfusion bleeding. CT abdomen was given surrounding for cystitis and urine culture is significant for gram-negative rods. He is on ceftriaxone for treatment.   Assessment & Plan:   Principal Problem:   Urethral bleeding Active Problems:   Prostate cancer ([place])   Pancytopenia ([facility])   Urethral bleeding Dried blood over meatus today. Hemoglobin stable. Concerning since patient stated he did have some bleeding overnight -Outpatient follow-up with urology -Watch overnight for resolution of bleeding  Prostate cancer -Outpatient follow-up with urology  Pancytopenia Platelets improved with platelet infusion. CBC stable from yesterday. -outpatient follow-up with Dr. Irene Limbo  Cystitis Started on ceftriaxone. Urine culture significant for gram negative rods. Would be hesitant to send patient out on empiric coverage since he has a chronic indwelling catheter. -continue ceftriaxone -urine culture pending   DVT prophylaxis: SCDs Code Status: Full code Family Communication: None at bedside Disposition Plan: Discharge in 24 hours pending urine culture results   Consultants:   Hematology  Procedures:   Bone marrow biopsy (04/21/17)  Antimicrobials:  Ceftriaxone    Subjective: Patient reports some bleeding overnight around the meatus.  Objective: Vitals:   04/21/17 1045 04/21/17 1113 04/21/17 1226 04/21/17 1253  BP: (!) 148/78 118/70 105/72 109/73  Pulse: 90 83  76  Resp: 14  18   Temp:  98.2 F (36.8 C) 98.1 F (36.7 C)   TempSrc:  Oral Oral   SpO2: 100% 100% 98%   Weight:      Height:         Intake/Output Summary (Last 24 hours) at 04/21/17 1439 Last data filed at 04/21/17 1252  Gross per 24 hour  Intake                0 ml  Output             1650 ml  Net            -1650 ml   Filed Weights   04/19/17 2209  Weight: 77.3 kg (170 lb 6.7 oz)    Examination:  General exam: Appears calm and comfortable Respiratory system: Clear to auscultation. Respiratory effort normal. Cardiovascular system: S1 & S2 heard, RRR. No murmurs. Gastrointestinal system: Abdomen is nondistended, soft and nontender. Normal bowel sounds heard. GU: Right blood around urethral meatus. Indwelling Foley in place Central nervous system: Alert and oriented. No focal neurological deficits. Extremities: No edema. No calf tenderness Skin: No cyanosis. No rashes Psychiatry: Judgement and insight appear normal. Mood & affect appropriate.     Data Reviewed: I have personally reviewed following labs and imaging studies  CBC:  Recent Labs Lab 04/19/17 1254 04/19/17 1803 04/20/17 0456 04/21/17 0537  WBC 2.5* 2.1* 2.2* 2.5*  NEUTROABS 1.3* 1.2*  --  1.1*  HGB 9.8* 9.0* 8.4* 8.8*  HCT 30.8* 27.4* 25.8* 26.9*  MCV 86.0 82.8 85.1 84.1  PLT 32* 26* 55* 52*   Basic Metabolic Panel:  Recent Labs Lab 04/19/17 1254 04/19/17 1803 04/20/17 0456 04/21/17 0537  NA 143 139 142 139  K 4.0 3.6 4.1 4.1  CL  --  103 106 103  CO2 '29 28 31 ' 29  GLUCOSE 93 158* 107* 104*  BUN 16.'2 17 15 18  ' CREATININE 1.1 1.01 1.00 1.08  CALCIUM 9.5 8.5* 8.5* 8.7*   GFR: Estimated Creatinine Clearance: 57.7 mL/min (by C-G formula based on SCr of 1.08 mg/dL). Liver Function Tests:  Recent Labs Lab 04/19/17 1254  AST 19  ALT 25  ALKPHOS 60  BILITOT 0.59  PROT 7.5  ALBUMIN 3.9   No results for input(s): LIPASE, AMYLASE in the last 168 hours. No results for input(s): AMMONIA in the last 168 hours. Coagulation Profile:  Recent Labs Lab 04/19/17 1803  INR 1.04   Cardiac Enzymes: No results for  input(s): CKTOTAL, CKMB, CKMBINDEX, TROPONINI in the last 168 hours. BNP (last 3 results) No results for input(s): PROBNP in the last 8760 hours. HbA1C: No results for input(s): HGBA1C in the last 72 hours. CBG: No results for input(s): GLUCAP in the last 168 hours. Lipid Profile: No results for input(s): CHOL, HDL, LDLCALC, TRIG, CHOLHDL, LDLDIRECT in the last 72 hours. Thyroid Function Tests: No results for input(s): TSH, T4TOTAL, FREET4, T3FREE, THYROIDAB in the last 72 hours. Anemia Panel:  Recent Labs  04/19/17 1254  VITAMINB12 >2000*  RETICCTPCT 1.23   Sepsis Labs: No results for input(s): PROCALCITON, LATICACIDVEN in the last 168 hours.  Recent Results (from the past 240 hour(s))  TECHNOLOGIST REVIEW     Status: None   Collection Time: 04/19/17 12:54 PM  Result Value Ref Range Status   Technologist Review   Final    mod poly and ovalos, few teardrops, schistocytes, acanthos and helmet cells   Culture, Urine     Status: Abnormal (Preliminary result)   Collection Time: 04/19/17 10:19 PM  Result Value Ref Range Status   Specimen Description URINE, CATHETERIZED  Final   Special Requests NONE  Final   Culture (A)  Final    >=100,000 COLONIES/mL GRAM NEGATIVE RODS CULTURE REINCUBATED FOR BETTER GROWTH Performed at [facility], [address] [place], [place] [address]    Report Status PENDING  Incomplete         Radiology Studies: Ct Chest W Contrast  Result Date: 04/21/2017 CLINICAL DATA:  Non-Hodgkin's lymphoma. Hematuria and urinary retention. Constipation. Personal history of prostate carcinoma. EXAM: CT CHEST, ABDOMEN, AND PELVIS WITH CONTRAST TECHNIQUE: Multidetector CT imaging of the chest, abdomen and pelvis was performed following the standard protocol during bolus administration of intravenous contrast. CONTRAST:  135m ISOVUE-300 IOPAMIDOL (ISOVUE-300) INJECTION 61% COMPARISON:  AP CT only on 06/09/2011 FINDINGS: CT CHEST FINDINGS Cardiovascular: No  acute findings. Mediastinum/Lymph Nodes: 2.6 cm solid nodule in left thyroid lobe. No other masses or lymphadenopathy identified. Lungs/Pleura: Mild biapical and bilateral lower lobe scarring. No pulmonary infiltrate or mass identified. No effusion present. Musculoskeletal:  No suspicious bone lesions identified. CT ABDOMEN AND PELVIS FINDINGS Hepatobiliary: No masses identified. Gallbladder is unremarkable. Pancreas:  No mass or inflammatory changes. Spleen:  Within normal limits in size and appearance. Adrenals/Urinary tract: No masses or hydronephrosis. Tiny bilateral renal cysts again noted. Foley catheter seen within the bladder which is nearly completely empty. Mild diffuse bladder wall thickening and mucosal enhancement is seen, consistent with cystitis. Stomach/Bowel: No evidence of obstruction, inflammatory process, or abnormal fluid collections. Diverticulosis is seen predominately involving the descending and sigmoid colon, however there is no evidence of diverticulitis . Normal appendix visualized. Vascular/Lymphatic: No pathologically enlarged lymph nodes identified. No abdominal aortic aneurysm. Aortic atherosclerosis. Reproductive: Normal size prostate gland with brachytherapy seeds. Unremarkable seminal vesicles. Other:  None. Musculoskeletal:  No suspicious bone lesions identified. IMPRESSION: Diffuse cystitis involving the urinary bladder. Foley catheter in place. No evidence of lymphadenopathy or metastatic disease within the chest, abdomen, or pelvis. 2.6 cm left thyroid lobe nodule. Consider thyroid ultrasound for further evaluation. This recommendation follows ACR consensus guidelines: Managing Incidental Thyroid Nodules Detected on Imaging: White Paper of the ACR Incidental Thyroid Findings Committee. J Am Coll Radiol 2015;12(2):143-150. Colonic diverticulosis. No radiographic evidence of diverticulitis. Aortic atherosclerosis. Electronically Signed   By: Earle Gell M.D.   On: 04/21/2017  07:28   Ct Abdomen Pelvis W Contrast  Result Date: 04/21/2017 CLINICAL DATA:  Non-Hodgkin's lymphoma. Hematuria and urinary retention. Constipation. Personal history of prostate carcinoma. EXAM: CT CHEST, ABDOMEN, AND PELVIS WITH CONTRAST TECHNIQUE: Multidetector CT imaging of the chest, abdomen and pelvis was performed following the standard protocol during bolus administration of intravenous contrast. CONTRAST:  167m ISOVUE-300 IOPAMIDOL (ISOVUE-300) INJECTION 61% COMPARISON:  AP CT only on 06/09/2011 FINDINGS: CT CHEST FINDINGS Cardiovascular: No acute findings. Mediastinum/Lymph Nodes: 2.6 cm solid nodule in left thyroid lobe. No other masses or lymphadenopathy identified. Lungs/Pleura: Mild biapical and bilateral lower lobe scarring. No pulmonary infiltrate or mass identified. No effusion present. Musculoskeletal:  No suspicious bone lesions identified. CT ABDOMEN AND PELVIS FINDINGS Hepatobiliary: No masses identified. Gallbladder is unremarkable. Pancreas:  No mass or inflammatory changes. Spleen:  Within normal limits in size and appearance. Adrenals/Urinary tract: No masses or hydronephrosis. Tiny bilateral renal cysts again noted. Foley catheter seen within the bladder which is nearly completely empty. Mild diffuse bladder wall thickening and mucosal enhancement is seen, consistent with cystitis. Stomach/Bowel: No evidence of obstruction, inflammatory process, or abnormal fluid collections. Diverticulosis is seen predominately involving the descending and sigmoid colon, however there is no evidence of diverticulitis . Normal appendix visualized. Vascular/Lymphatic: No pathologically enlarged lymph nodes identified. No abdominal aortic aneurysm. Aortic atherosclerosis. Reproductive: Normal size prostate gland with brachytherapy seeds. Unremarkable seminal vesicles. Other:  None. Musculoskeletal:  No suspicious bone lesions identified. IMPRESSION: Diffuse cystitis involving the urinary bladder. Foley  catheter in place. No evidence of lymphadenopathy or metastatic disease within the chest, abdomen, or pelvis. 2.6 cm left thyroid lobe nodule. Consider thyroid ultrasound for further evaluation. This recommendation follows ACR consensus guidelines: Managing Incidental Thyroid Nodules Detected on Imaging: White Paper of the ACR Incidental Thyroid Findings Committee. J Am Coll Radiol 2015;12(2):143-150. Colonic diverticulosis. No radiographic evidence of diverticulitis. Aortic atherosclerosis. Electronically Signed   By: JEarle GellM.D.   On: 04/21/2017 07:28   Ct Biopsy  Result Date: 04/21/2017 CLINICAL DATA:  Pancytopenia of uncertain etiology. EXAM: CT GUIDED DEEP ILIAC BONE ASPIRATION AND CORE BIOPSY TECHNIQUE: The procedure, risks (including but not limited to bleeding, infection, organ damage ), benefits, and alternatives were explained to the patient. Questions regarding the procedure were encouraged and answered. The patient understands and consents to the procedure. Patient was placed supine on the CT gantry and limited axial scans through the pelvis were obtained. Appropriate skin entry site was identified. Skin site was marked, prepped with chlorhexidine, draped in usual sterile fashion, and infiltrated locally with 1% lidocaine. Intravenous Fentanyl and Versed were administered as conscious sedation during continuous monitoring of the patient's level of consciousness and physiological / cardiorespiratory status by the radiology RN, with a total moderate sedation time of 10 minutes. Under CT fluoroscopic guidance an 11-gauge Cook trocar bone needle was advanced into the right iliac bone just lateral to the sacroiliac joint. Once needle tip position was confirmed, core and aspiration  samples were obtained, submitted to pathology for approval. Post procedure scans show no hematoma or fracture. Patient tolerated procedure well. COMPLICATIONS: COMPLICATIONS none IMPRESSION: 1. Technically successful CT  guided right iliac bone core and aspiration biopsy. Electronically Signed   By: Lucrezia Europe M.D.   On: 04/21/2017 11:24   Ct Bone Marrow Biopsy & Aspiration  Result Date: 04/21/2017 CLINICAL DATA:  Pancytopenia of uncertain etiology. EXAM: CT GUIDED DEEP ILIAC BONE ASPIRATION AND CORE BIOPSY TECHNIQUE: The procedure, risks (including but not limited to bleeding, infection, organ damage ), benefits, and alternatives were explained to the patient. Questions regarding the procedure were encouraged and answered. The patient understands and consents to the procedure. Patient was placed supine on the CT gantry and limited axial scans through the pelvis were obtained. Appropriate skin entry site was identified. Skin site was marked, prepped with chlorhexidine, draped in usual sterile fashion, and infiltrated locally with 1% lidocaine. Intravenous Fentanyl and Versed were administered as conscious sedation during continuous monitoring of the patient's level of consciousness and physiological / cardiorespiratory status by the radiology RN, with a total moderate sedation time of 10 minutes. Under CT fluoroscopic guidance an 11-gauge Cook trocar bone needle was advanced into the right iliac bone just lateral to the sacroiliac joint. Once needle tip position was confirmed, core and aspiration samples were obtained, submitted to pathology for approval. Post procedure scans show no hematoma or fracture. Patient tolerated procedure well. COMPLICATIONS: COMPLICATIONS none IMPRESSION: 1. Technically successful CT guided right iliac bone core and aspiration biopsy. Electronically Signed   By: Lucrezia Europe M.D.   On: 04/21/2017 11:24        Scheduled Meds: . docusate sodium  100 mg Oral Daily  . fentaNYL      . gabapentin  200 mg Oral TID  . LORazepam  1 mg Oral QHS  . midazolam      . nortriptyline  25 mg Oral QHS  . polyethylene glycol  17 g Oral Daily  . temazepam  15 mg Oral QHS   Continuous Infusions: .  cefTRIAXone (ROCEPHIN)  IV 1 g (04/21/17 1341)  . sodium chloride    . sodium chloride       LOS: 1 day     Cordelia Poche, MD [facility] 04/21/2017, 2:39 PM Pager: [phone]  If 7PM-7AM, please contact night-coverage [email or web address] Password [email or web address] 04/21/2017, 2:39 PM

## 2017-04-21 NOTE — Evaluation (Signed)
Physical Therapy Evaluation Patient Details Name: Jordan Terry MRN: [id number] DOB: 02/05/1932 Today's Date: 04/21/2017   History of Present Illness  83 y/o male admitted with urethral bleeding.  PMH: prostate CA, B tinnitus, Grover's Disease, vertigo  Clinical Impression  Pt admitted with above diagnosis. Pt currently with functional limitations due to the deficits listed below (see PT Problem List). Pt will benefit from skilled PT to increase their independence and safety with mobility to allow discharge to the venue listed below.  Pt normally ambulates 2 miles every AM.  He was a bit tentative with gait initially, but improved as gait progressed. Will follow acutely, but no PT needs after d/c.     Follow Up Recommendations No PT follow up    Equipment Recommendations  None recommended by PT    Recommendations for Other Services       Precautions / Restrictions Precautions Precautions: None Restrictions Weight Bearing Restrictions: No      Mobility  Bed Mobility Overal bed mobility: Modified Independent                Transfers Overall transfer level: Modified independent                  Ambulation/Gait Ambulation/Gait assistance: Min guard;Supervision Ambulation Distance (Feet): 350 Feet Assistive device: None Gait Pattern/deviations: Step-through pattern;Trunk flexed   Gait velocity interpretation: at or above normal speed for age/gender General Gait Details: Pt tenative and guarded with gait initially, but improved as gait progressed. Pt needed cues for posture  Stairs            Wheelchair Mobility    Modified Rankin (Stroke Patients Only)       Balance Overall balance assessment: No apparent balance deficits (not formally assessed)                                           Pertinent Vitals/Pain Pain Assessment: No/denies pain    Home Living Family/patient expects to be discharged to:: Private  residence Living Arrangements: Spouse/significant other Available Help at Discharge: Family Type of Home: House Home Access: Stairs to enter   Technical brewer of Steps: 1 Home Layout: One level Home Equipment: Cane - single point      Prior Function Level of Independence: Independent         Comments: walks 2 miles/day every AM and does his yardwork     Hand Dominance        Extremity/Trunk Assessment   Upper Extremity Assessment Upper Extremity Assessment: Overall WFL for tasks assessed    Lower Extremity Assessment Lower Extremity Assessment: Overall WFL for tasks assessed       Communication   Communication: HOH  Cognition Arousal/Alertness: Awake/alert Behavior During Therapy: WFL for tasks assessed/performed Overall Cognitive Status: Within Functional Limits for tasks assessed                                 General Comments: very pleasant      General Comments      Exercises     Assessment/Plan    PT Assessment Patient needs continued PT services  PT Problem List Decreased mobility;Decreased activity tolerance;Decreased balance;Cardiopulmonary status limiting activity       PT Treatment Interventions Gait training;Stair training;Functional mobility training;Therapeutic activities;Therapeutic exercise    PT Goals (Current  goals can be found in the Care Plan section)  Acute Rehab PT Goals Patient Stated Goal: return to PLOF PT Goal Formulation: With patient Time For Goal Achievement: 05/05/17 Potential to Achieve Goals: Good    Frequency Min 3X/week   Barriers to discharge        Co-evaluation               AM-PAC PT "6 Clicks" Daily Activity  Outcome Measure Difficulty turning over in bed (including adjusting bedclothes, sheets and blankets)?: None Difficulty moving from lying on back to sitting on the side of the bed? : None Difficulty sitting down on and standing up from a chair with arms (e.g.,  wheelchair, bedside commode, etc,.)?: None Help needed moving to and from a bed to chair (including a wheelchair)?: None Help needed walking in hospital room?: A Little Help needed climbing 3-5 steps with a railing? : A Little 6 Click Score: 22    End of Session Equipment Utilized During Treatment: Gait belt Activity Tolerance: Patient tolerated treatment well Patient left: in bed (transport arrived to take to procedure) Nurse Communication: Mobility status PT Visit Diagnosis: Difficulty in walking, not elsewhere classified (R26.2)    Time: 0347-4259 PT Time Calculation (min) (ACUTE ONLY): 24 min   Charges:   PT Evaluation $PT Eval Low Complexity: 1 Procedure PT Treatments $Gait Training: 8-22 mins   PT G Codes:        Jordan Terry L. Jordan Terry, [place] Pager [phone] 04/21/2017   Jordan Terry 04/21/2017, 10:41 AM

## 2017-04-21 NOTE — Progress Notes (Signed)
Marland Kitchen   HEMATOLOGY/ONCOLOGY INPATIENT PROGRESS NOTE  Date of Service: 04/21/2017  Inpatient Attending: .Mariel Aloe, MD   SUBJECTIVE  Patient was seen in follow-up for his pancytopenia. Still notes some mild bleeding around his Foleys catheter. I discussed his flow cytometry results as having a monoclonal lymphocyte population. CT chest abdomen pelvis does not show any overt lymphadenopathy or splenomegaly or other evidence of metastatic disease. He does have a 2.6 cm left thyroid nodule that would need to be evaluated as outpatient. Noted to have diffuse cystitis with a Foley catheter in place. On antibiotics as per the hospitalist. He had his bone marrow biopsy today without any significant discomfort.    OBJECTIVE:  NAD  PHYSICAL EXAMINATION: . Vitals:   04/21/17 1045 04/21/17 1113 04/21/17 1226 04/21/17 1253  BP: (!) 148/78 118/70 105/72 109/73  Pulse: 90 83  76  Resp: 14  18   Temp:  98.2 F (36.8 C) 98.1 F (36.7 C)   TempSrc:  Oral Oral   SpO2: 100% 100% 98%   Weight:      Height:       Filed Weights   04/19/17 2209  Weight: 170 lb 6.7 oz (77.3 kg)   .Body mass index is 21.88 kg/m.  GENERAL:alert, in no acute distress and comfortable SKIN: skin color, texture, turgor are normal, no rashes or significant lesions EYES: normal, conjunctiva are pink and non-injected, sclera clear OROPHARYNX:no exudate, no erythema and lips, buccal mucosa, and tongue normal  NECK: supple, no JVD, thyroid normal size, non-tender, without nodularity LYMPH:  no palpable lymphadenopathy in the cervical, axillary or inguinal LUNGS: clear to auscultation with normal respiratory effort HEART: regular rate & rhythm,  no murmurs and no lower extremity edema ABDOMEN: abdomen soft, non-tender, normoactive bowel sounds  Musculoskeletal: no cyanosis of digits and no clubbing  PSYCH: alert & oriented x 3 with fluent speech NEURO: no focal motor/sensory deficits  MEDICAL HISTORY:  Past  Medical History:  Diagnosis Date  . Anginal pain ([place]) 20 years ago  . Anxiety   . Arthritis   . Bilateral tinnitus    wears hearing aides  . Cancer [facility])    prostate  . Grover's disease   . Headache(784.0)    not since retired  . Hematuria 04/14/2017  . Self-catheterizes urinary bladder 04/19/2017  . Vertigo     SURGICAL HISTORY: Past Surgical History:  Procedure Laterality Date  . CARDIAC CATHETERIZATION  "20 years ago"  . CARPAL TUNNEL RELEASE Left 2008  . CATARACT EXTRACTION Bilateral 2014  . CYSTOSCOPY WITH URETHRAL DILATATION N/A 08/14/2013   Procedure: CYSTOSCOPY WITH URETHRAL DILATATION BALLOON DILATION OF URETHRAL STRICTURE;  Surgeon: Dutch Gray, MD;  Location: [facility];  Service: Urology;  Laterality: N/A;  BALLOON DILATION   . CYSTOSCOPY WITH URETHRAL DILATATION N/A 05/07/2014   Procedure: CYSTOSCOPY WITH BALLOON DILATION OF URETHRAL STRICTURE;  Surgeon: Raynelle Bring, MD;  Location: [facility];  Service: Urology;  Laterality: N/A;  [place]   "scope"  . NOSE SURGERY  1991, 1998  . RADIOACTIVE SEED IMPLANT  2002   "for prostate cancer"  . SHOULDER SURGERY  2012   right  . THYROIDECTOMY, PARTIAL  1974  . VASECTOMY  1970    SOCIAL HISTORY: Social History   Social History  . Marital status: Married    Spouse name: [name]  . Number of children: 3  . Years of education: college  Occupational History  . Not on file.   Social History Main Topics  . Smoking status: Former Smoker    Years: 10.00    Types: Cigars    Quit date: 1979  . Smokeless tobacco: Never Used  . Alcohol use No  . Drug use: No  . Sexual activity: Not on file   Other Topics Concern  . Not on file   Social History Narrative   Patient is married [name]) and lives at home with his wife.   Patient has three children.   Patient is retired.   Patient has a college education in [place].   Patient is right-handed.   Patient drinks  one cup of coffee daily and one 8 oz of tea daily.    FAMILY HISTORY: Family History  Problem Relation Age of Onset  . Anxiety disorder Mother   . Cancer Father        colon, lung  . Diabetes Brother   . Cancer Brother   . Diabetes Sister   . Leukemia Sister   . Cancer Sister        breast  . Cancer Sister        breast  . Cancer Sister        thyroid  . Diabetes Sister     ALLERGIES:  is allergic to benadryl [diphenhydramine hcl] and hydrocodone.  MEDICATIONS:  Scheduled Meds: . docusate sodium  100 mg Oral Daily  . fentaNYL      . gabapentin  200 mg Oral TID  . LORazepam  1 mg Oral QHS  . midazolam      . nortriptyline  25 mg Oral QHS  . polyethylene glycol  17 g Oral Daily  . temazepam  15 mg Oral QHS   Continuous Infusions: . cefTRIAXone (ROCEPHIN)  IV Stopped (04/21/17 1411)  . sodium chloride    . sodium chloride     PRN Meds:.acetaminophen **OR** acetaminophen, ondansetron **OR** ondansetron (ZOFRAN) IV, oxyCODONE-acetaminophen, traMADol  REVIEW OF SYSTEMS:    10 Point review of Systems was done is negative except as noted above.   LABORATORY DATA:  I have reviewed the data as listed  . CBC Latest Ref Rng & Units 04/21/2017 04/20/2017 04/19/2017  WBC 4.0 - 10.5 K/uL 2.5(L) 2.2(L) 2.1(L)  Hemoglobin 13.0 - 17.0 g/dL 8.8(L) 8.4(L) 9.0(L)  Hematocrit 39.0 - 52.0 % 26.9(L) 25.8(L) 27.4(L)  Platelets 150 - 400 K/uL 52(L) 55(L) 26(LL)    . CMP Latest Ref Rng & Units 04/21/2017 04/20/2017 04/19/2017  Glucose 65 - 99 mg/dL 104(H) 107(H) 158(H)  BUN 6 - 20 mg/dL '18 15 17  ' Creatinine 0.61 - 1.24 mg/dL 1.08 1.00 1.01  Sodium 135 - 145 mmol/L 139 142 139  Potassium 3.5 - 5.1 mmol/L 4.1 4.1 3.6  Chloride 101 - 111 mmol/L 103 106 103  CO2 22 - 32 mmol/L '29 31 28  ' Calcium 8.9 - 10.3 mg/dL 8.7(L) 8.5(L) 8.5(L)  Total Protein 6.4 - 8.3 g/dL - - 7.5  Total Bilirubin 0.20 - 1.20 mg/dL - - 0.59  Alkaline Phos 40 - 150 U/L - - 60  AST 5 - 34 U/L - - 19  ALT 0 - 55  U/L - - 25   Component     Latest Ref Rng & Units 04/19/2017  WBC     4.0 - 10.3 10e3/uL 2.5 (L)  NEUT#     1.5 - 6.5 10e3/uL 1.3 (L)  Hemoglobin     13.0 - 17.1 g/dL 9.8 (  L)  HCT     38.4 - 49.9 % 30.8 (L)  Platelets     140 - 400 10e3/uL 32 (L)  MCV     79.3 - 98.0 fL 86.0  MCH     27.2 - 33.4 pg 27.4  MCHC     32.0 - 36.0 g/dL 31.8 (L)  RBC     4.20 - 5.82 10e6/uL 3.58 (L)  RDW     11.0 - 14.6 % 20.6 (H)  lymph#     0.9 - 3.3 10e3/uL 1.1  MONO#     0.1 - 0.9 10e3/uL 0.1  Eosinophils Absolute     0.0 - 0.5 10e3/uL 0.1  Basophils Absolute     0.0 - 0.1 10e3/uL 0.0  NEUT%     39.0 - 75.0 % 50.1  LYMPH%     14.0 - 49.0 % 41.9  MONO%     0.0 - 14.0 % 4.0  EOS%     0.0 - 7.0 % 3.6  BASO%     0.0 - 2.0 % 0.4  Retic %     0.80 - 1.80 % 1.23  Retic Ct Abs     34.80 - 93.90 10e3/uL 44.03  Immature Retic Fract     3.00 - 10.60 % 14.10 (H)  Sodium     136 - 145 mEq/L 143  Potassium     3.5 - 5.1 mEq/L 4.0  Chloride     98 - 109 mEq/L 103  CO2     22 - 29 mEq/L 29  Glucose     70 - 140 mg/dl 93  BUN     7.0 - 26.0 mg/dL 16.2  Creatinine     0.7 - 1.3 mg/dL 1.1  Total Bilirubin     0.20 - 1.20 mg/dL 0.59  Alkaline Phosphatase     40 - 150 U/L 60  AST     5 - 34 U/L 19  ALT     0 - 55 U/L 25  Total Protein     6.4 - 8.3 g/dL 7.5  Albumin     3.5 - 5.0 g/dL 3.9  Calcium     8.4 - 10.4 mg/dL 9.5  Anion gap     3 - 11 mEq/L 11  EGFR     >90 ml/min/1.73 m2 65 (L)  LDH     125 - 245 U/L 191  Sed Rate     0 - 30 mm/hr 19  Vitamin B12     232 - 1245 pg/mL >2000 (H)  HIV     Non Reactive Non Reactive  Hep C Virus Ab     0.0 - 0.9 s/co ratio <0.1     RADIOGRAPHIC STUDIES: I have personally reviewed the radiological images as listed and agreed with the findings in the report. Ct Chest W Contrast  Result Date: 04/21/2017 CLINICAL DATA:  Non-Hodgkin's lymphoma. Hematuria and urinary retention. Constipation. Personal history of prostate  carcinoma. EXAM: CT CHEST, ABDOMEN, AND PELVIS WITH CONTRAST TECHNIQUE: Multidetector CT imaging of the chest, abdomen and pelvis was performed following the standard protocol during bolus administration of intravenous contrast. CONTRAST:  136m ISOVUE-300 IOPAMIDOL (ISOVUE-300) INJECTION 61% COMPARISON:  AP CT only on 06/09/2011 FINDINGS: CT CHEST FINDINGS Cardiovascular: No acute findings. Mediastinum/Lymph Nodes: 2.6 cm solid nodule in left thyroid lobe. No other masses or lymphadenopathy identified. Lungs/Pleura: Mild biapical and bilateral lower lobe scarring. No pulmonary infiltrate or mass identified. No effusion present. Musculoskeletal:  No  suspicious bone lesions identified. CT ABDOMEN AND PELVIS FINDINGS Hepatobiliary: No masses identified. Gallbladder is unremarkable. Pancreas:  No mass or inflammatory changes. Spleen:  Within normal limits in size and appearance. Adrenals/Urinary tract: No masses or hydronephrosis. Tiny bilateral renal cysts again noted. Foley catheter seen within the bladder which is nearly completely empty. Mild diffuse bladder wall thickening and mucosal enhancement is seen, consistent with cystitis. Stomach/Bowel: No evidence of obstruction, inflammatory process, or abnormal fluid collections. Diverticulosis is seen predominately involving the descending and sigmoid colon, however there is no evidence of diverticulitis . Normal appendix visualized. Vascular/Lymphatic: No pathologically enlarged lymph nodes identified. No abdominal aortic aneurysm. Aortic atherosclerosis. Reproductive: Normal size prostate gland with brachytherapy seeds. Unremarkable seminal vesicles. Other:  None. Musculoskeletal:  No suspicious bone lesions identified. IMPRESSION: Diffuse cystitis involving the urinary bladder. Foley catheter in place. No evidence of lymphadenopathy or metastatic disease within the chest, abdomen, or pelvis. 2.6 cm left thyroid lobe nodule. Consider thyroid ultrasound for further  evaluation. This recommendation follows ACR consensus guidelines: Managing Incidental Thyroid Nodules Detected on Imaging: White Paper of the ACR Incidental Thyroid Findings Committee. J Am Coll Radiol 2015;12(2):143-150. Colonic diverticulosis. No radiographic evidence of diverticulitis. Aortic atherosclerosis. Electronically Signed   By: Earle Gell M.D.   On: 04/21/2017 07:28   Ct Abdomen Pelvis W Contrast  Result Date: 04/21/2017 CLINICAL DATA:  Non-Hodgkin's lymphoma. Hematuria and urinary retention. Constipation. Personal history of prostate carcinoma. EXAM: CT CHEST, ABDOMEN, AND PELVIS WITH CONTRAST TECHNIQUE: Multidetector CT imaging of the chest, abdomen and pelvis was performed following the standard protocol during bolus administration of intravenous contrast. CONTRAST:  127m ISOVUE-300 IOPAMIDOL (ISOVUE-300) INJECTION 61% COMPARISON:  AP CT only on 06/09/2011 FINDINGS: CT CHEST FINDINGS Cardiovascular: No acute findings. Mediastinum/Lymph Nodes: 2.6 cm solid nodule in left thyroid lobe. No other masses or lymphadenopathy identified. Lungs/Pleura: Mild biapical and bilateral lower lobe scarring. No pulmonary infiltrate or mass identified. No effusion present. Musculoskeletal:  No suspicious bone lesions identified. CT ABDOMEN AND PELVIS FINDINGS Hepatobiliary: No masses identified. Gallbladder is unremarkable. Pancreas:  No mass or inflammatory changes. Spleen:  Within normal limits in size and appearance. Adrenals/Urinary tract: No masses or hydronephrosis. Tiny bilateral renal cysts again noted. Foley catheter seen within the bladder which is nearly completely empty. Mild diffuse bladder wall thickening and mucosal enhancement is seen, consistent with cystitis. Stomach/Bowel: No evidence of obstruction, inflammatory process, or abnormal fluid collections. Diverticulosis is seen predominately involving the descending and sigmoid colon, however there is no evidence of diverticulitis . Normal  appendix visualized. Vascular/Lymphatic: No pathologically enlarged lymph nodes identified. No abdominal aortic aneurysm. Aortic atherosclerosis. Reproductive: Normal size prostate gland with brachytherapy seeds. Unremarkable seminal vesicles. Other:  None. Musculoskeletal:  No suspicious bone lesions identified. IMPRESSION: Diffuse cystitis involving the urinary bladder. Foley catheter in place. No evidence of lymphadenopathy or metastatic disease within the chest, abdomen, or pelvis. 2.6 cm left thyroid lobe nodule. Consider thyroid ultrasound for further evaluation. This recommendation follows ACR consensus guidelines: Managing Incidental Thyroid Nodules Detected on Imaging: White Paper of the ACR Incidental Thyroid Findings Committee. J Am Coll Radiol 2015;12(2):143-150. Colonic diverticulosis. No radiographic evidence of diverticulitis. Aortic atherosclerosis. Electronically Signed   By: JEarle GellM.D.   On: 04/21/2017 07:28   Ct Biopsy  Result Date: 04/21/2017 CLINICAL DATA:  Pancytopenia of uncertain etiology. EXAM: CT GUIDED DEEP ILIAC BONE ASPIRATION AND CORE BIOPSY TECHNIQUE: The procedure, risks (including but not limited to bleeding, infection, organ damage ), benefits, and alternatives were  explained to the patient. Questions regarding the procedure were encouraged and answered. The patient understands and consents to the procedure. Patient was placed supine on the CT gantry and limited axial scans through the pelvis were obtained. Appropriate skin entry site was identified. Skin site was marked, prepped with chlorhexidine, draped in usual sterile fashion, and infiltrated locally with 1% lidocaine. Intravenous Fentanyl and Versed were administered as conscious sedation during continuous monitoring of the patient's level of consciousness and physiological / cardiorespiratory status by the radiology RN, with a total moderate sedation time of 10 minutes. Under CT fluoroscopic guidance an 11-gauge  Cook trocar bone needle was advanced into the right iliac bone just lateral to the sacroiliac joint. Once needle tip position was confirmed, core and aspiration samples were obtained, submitted to pathology for approval. Post procedure scans show no hematoma or fracture. Patient tolerated procedure well. COMPLICATIONS: COMPLICATIONS none IMPRESSION: 1. Technically successful CT guided right iliac bone core and aspiration biopsy. Electronically Signed   By: Lucrezia Europe M.D.   On: 04/21/2017 11:24   Ct Bone Marrow Biopsy & Aspiration  Result Date: 04/21/2017 CLINICAL DATA:  Pancytopenia of uncertain etiology. EXAM: CT GUIDED DEEP ILIAC BONE ASPIRATION AND CORE BIOPSY TECHNIQUE: The procedure, risks (including but not limited to bleeding, infection, organ damage ), benefits, and alternatives were explained to the patient. Questions regarding the procedure were encouraged and answered. The patient understands and consents to the procedure. Patient was placed supine on the CT gantry and limited axial scans through the pelvis were obtained. Appropriate skin entry site was identified. Skin site was marked, prepped with chlorhexidine, draped in usual sterile fashion, and infiltrated locally with 1% lidocaine. Intravenous Fentanyl and Versed were administered as conscious sedation during continuous monitoring of the patient's level of consciousness and physiological / cardiorespiratory status by the radiology RN, with a total moderate sedation time of 10 minutes. Under CT fluoroscopic guidance an 11-gauge Cook trocar bone needle was advanced into the right iliac bone just lateral to the sacroiliac joint. Once needle tip position was confirmed, core and aspiration samples were obtained, submitted to pathology for approval. Post procedure scans show no hematoma or fracture. Patient tolerated procedure well. COMPLICATIONS: COMPLICATIONS none IMPRESSION: 1. Technically successful CT guided right iliac bone core and aspiration  biopsy. Electronically Signed   By: Lucrezia Europe M.D.   On: 04/21/2017 11:24    ASSESSMENT & PLAN:     ASSESSMENT & PLAN:   83 year old gentleman with   #1 Progressive pancytopenia. Patient was previously on methotrexate but notes that he has been off of this for 2 months And continues to have pancytopenia. He has been on folic acid replacement as well.  Vitamin B12 and folate levels within normal limits. HIV nonreactive Hepatitis C negative LDH within normal limits at 191  #2 thrombocytopenia platelet count of 32k. Current lip to 52k after platelet transfusion. #3 neutropenia WBC count of 2.5k with an ANC of 1.3k #4 normocytic anemia hemoglobin of 9.8 with an MCV of 86.--> hgb 8.8 #5 atypical lymphocytes noted on peripheral blood smear - monoclonal B lymphocytosis versus non-Hodgkin's lymphoma   CT chest abdomen pelvis as noted above shows no overt lymphadenopathy or hepatosplenomegaly. Bone marrow biopsy done on 04/21/2017- results pending. Plan -May discharged from a hematology standpoint. We shall set him up for outpatient follow-up. -Would plan to transfuse PRBCs for hemoglobin less than 8 or if symptomatic. -Transfuse platelets for platelets less than 20k prophylactically and for platelets less than 50k if  bleeding. -Patient has been counseled on infection prophylaxis. -Further management based on bone marrow biopsy results.  #6 urinary tract infection and bleeding around Foleys catheter -Antibiotics per hospitalist -If bleeding continues despite platelets more than 50k might need to have a urology evaluation for urethral trauma from catheter. We'll defer management of this to the hospitalist.   I spent 30 minutes counseling the patient face to face. The total time spent in the appointment was 40 minutes and more than 50% was on counseling and direct patient cares.    Sullivan Lone MD [place] AAHIVMS [facility] Hematology/Oncology Physician [facility]  (Office):       [phone] (Work cell):  [phone] (Fax):           [phone]  04/21/2017 4:33 PM

## 2017-04-21 NOTE — Progress Notes (Signed)
Received paged to address patient's benzodiazepine prescriptions. patien tis currently on lorazepam and temazepam. Patient has been checked on [facility] database and receives his prescriptions from Dr. Casimiro Needle. Patient has been on this regimen for decades. Will continue home regimen and recommend this be changed as an outpatient.  Cordelia Poche, MD [facility] 04/21/2017, 5:15 PM Pager: [phone]

## 2017-04-22 DIAGNOSIS — C8590 Non-Hodgkin lymphoma, unspecified, unspecified site: Secondary | ICD-10-CM

## 2017-04-22 LAB — CBC
HEMATOCRIT: 29 % — AB (ref 39.0–52.0)
HEMOGLOBIN: 9.5 g/dL — AB (ref 13.0–17.0)
MCH: 27.1 pg (ref 26.0–34.0)
MCHC: 32.8 g/dL (ref 30.0–36.0)
MCV: 82.6 fL (ref 78.0–100.0)
Platelets: 55 10*3/uL — ABNORMAL LOW (ref 150–400)
RBC: 3.51 MIL/uL — AB (ref 4.22–5.81)
RDW: 20.1 % — ABNORMAL HIGH (ref 11.5–15.5)
WBC: 2.2 10*3/uL — ABNORMAL LOW (ref 4.0–10.5)

## 2017-04-22 LAB — FLOW CYTOMETRY

## 2017-04-22 MED ORDER — CIPROFLOXACIN HCL 500 MG PO TABS: 500.0000 mg | ORAL_TABLET | Freq: Two times a day (BID) | ORAL | 0 refills | Status: DC

## 2017-04-22 MED ORDER — DOCUSATE SODIUM 100 MG PO CAPS: 100.0000 mg | ORAL_CAPSULE | Freq: Every day | ORAL | 0 refills | Status: DC

## 2017-04-22 MED ORDER — POLYETHYLENE GLYCOL 3350 17 G PO PACK: 17.0000 g | PACK | Freq: Every day | ORAL | 0 refills | Status: DC

## 2017-04-22 NOTE — Discharge Instructions (Signed)
Jordan Terry,  You were admitted because of your bleeding. You were given platelets to help. Your blood count has been stable. Urology had been consulted and recommended you to see them in the hospital. A follow-up appointment has been made. Please also follow-up with Dr. Irene Limbo with regard to your biopsy results.

## 2017-04-22 NOTE — Progress Notes (Signed)
Went over all discharge paperwork with patient and family.  All questions answered.  VSS.  Prescriptions and paperwork given to patient. Education for foley care given and leg bag.  Supplies given.  PT wheeled out.

## 2017-04-22 NOTE — Discharge Summary (Signed)
Physician Discharge Summary  Jordan Terry ZOX:[id number] DOB: [birth year] DOA: 04/19/2017  PCP: [name], MD  Admit date: 04/19/2017 Discharge date: 04/22/2017  Admitted From: Home Disposition: Home  Recommendations for Outpatient Follow-up:  1. Follow up with PCP in 1 week 2. Follow up with Dr. Irene Limbo in 1 week for biopsy results and management of pancytopenia 3. Follow up with urology on 04/26/2017 4. Please obtain a CBC in one week to recheck WBC, Hemoglobin and Platelets 5. Please follow up on the following pending results: Bone marrow biopsy  Equipment/Devices: Foley  Discharge Condition: Stable CODE STATUS: Full code Diet recommendation: Heart healthy   Brief/Interim Summary:  Admission HPI written by Rise Patience, MD   Chief Complaint: Bleeding per urethra.  HPI: Jordan Terry is a 83 y.o. male with history of prostate cancer in remission who had come to the ER last week with urinary retention and had Foley catheter placement presents to the ER with persistent bleeding per urethra over the last few days. Denies any abdominal pain nausea vomiting or fever or chills. Patient was originally scheduled to follow with Dr. Alinda Money. 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  ED Course: In the ER patient was found to have active bleeding per the meatus. Urologist was consulted at this time urologist recommended perineal pressure. Since patient also had significant pancytopenia oncologist was consulted and was advised to have platelet transfusion since patient was actively bleeding.   Hospital course:  Urethral bleeding This is been present since patient's Foley was inserted in the setting of Darvocet opinion. Patient was transfused 1 unit of platelets. Bleeding became intermittent and self-limiting. Discussed with urology who recommends outpatient follow-up. Platelets are stable.  Prostate cancer Outpatient  follow-up with urology  Pancytopenia Platelets improved with platelet infusion and remained stable. Bleeding as mentioned above. Hemoglobin and white blood cell counts are also stable. Dr. Irene Limbo saw patient in the hospital. Peripheral smear significant for monoclonal B-cell population. Bone marrow biopsy obtained and results are pending at time of discharge. Patient to follow-up with Dr. Irene Limbo for results.  Cystitis Started on ceftriaxone. Urine culture significant for gram negative rods which was later identified as Klebsiella oxytocin and pseudomonas aeruginosa. Patient was transitioned to ciprofloxacin to complete a seven-day course of antibiotic therapy.   Discharge Diagnoses:  Principal Problem:   Urethral bleeding Active Problems:   Prostate cancer ([place])   Pancytopenia ([facility])   NHL (non-Hodgkin's lymphoma) ([facility])    Discharge Instructions   Allergies as of 04/22/2017      Reactions   Benadryl [diphenhydramine Hcl] Anxiety   MAKES HIM GO CRAZY   Hydrocodone Itching   Small amounts are okay      Medication List    TAKE these medications   ciprofloxacin 500 MG tablet Commonly known as:  CIPRO Take 1 tablet (500 mg total) by mouth 2 (two) times daily.   docusate sodium 100 MG capsule Commonly known as:  COLACE Take 1 capsule (100 mg total) by mouth daily. Start taking on:  04/23/2017   gabapentin 100 MG capsule Commonly known as:  NEURONTIN Take 200 mg by mouth 3 (three) times daily.   LORazepam 0.5 MG tablet Commonly known as:  ATIVAN Take 1 mg by mouth at bedtime.   nortriptyline 25 MG capsule Commonly known as:  PAMELOR Take 1 capsule (25 mg total) by mouth at bedtime.   oxyCODONE-acetaminophen 7.5-325 MG tablet Commonly known as:  PERCOCET Take  0.5-1 tablets by mouth every 8 (eight) hours as needed (pain.).   polyethylene glycol packet Commonly known as:  MIRALAX / GLYCOLAX Take 17 g by mouth daily. Start taking on:  04/23/2017   temazepam 15 MG  capsule Commonly known as:  RESTORIL Take 15 mg by mouth at bedtime.   traMADol 50 MG tablet Commonly known as:  ULTRAM Take 50 mg by mouth every 6 (six) hours as needed for moderate pain.   triamcinolone cream 0.1 % Commonly known as:  KENALOG Apply 1 application topically every morning. APPLY TO CHEST AND BACK EVERY MORNING FOR [place] DISEASE      Follow-up Information    [name], MD. Schedule an appointment as soon as possible for a visit in 1 week(s).   Specialty:  Internal Medicine Contact information: [place] [address] [phone]        Brunetta Genera, MD. Schedule an appointment as soon as possible for a visit in 1 week(s).   Specialties:  Hematology, Oncology Contact information: [address] [place] [address] [phone]          Allergies  Allergen Reactions  . Benadryl [Diphenhydramine Hcl] Anxiety    MAKES HIM GO CRAZY  . Hydrocodone Itching    Small amounts are okay    Consultations:  Urology  Oncology   Procedures/Studies: Ct Chest W Contrast  Result Date: 04/21/2017 CLINICAL DATA:  Non-Hodgkin's lymphoma. Hematuria and urinary retention. Constipation. Personal history of prostate carcinoma. EXAM: CT CHEST, ABDOMEN, AND PELVIS WITH CONTRAST TECHNIQUE: Multidetector CT imaging of the chest, abdomen and pelvis was performed following the standard protocol during bolus administration of intravenous contrast. CONTRAST:  119m ISOVUE-300 IOPAMIDOL (ISOVUE-300) INJECTION 61% COMPARISON:  AP CT only on 06/09/2011 FINDINGS: CT CHEST FINDINGS Cardiovascular: No acute findings. Mediastinum/Lymph Nodes: 2.6 cm solid nodule in left thyroid lobe. No other masses or lymphadenopathy identified. Lungs/Pleura: Mild biapical and bilateral lower lobe scarring. No pulmonary infiltrate or mass identified. No effusion present. Musculoskeletal:  No suspicious bone lesions identified. CT ABDOMEN AND PELVIS FINDINGS  Hepatobiliary: No masses identified. Gallbladder is unremarkable. Pancreas:  No mass or inflammatory changes. Spleen:  Within normal limits in size and appearance. Adrenals/Urinary tract: No masses or hydronephrosis. Tiny bilateral renal cysts again noted. Foley catheter seen within the bladder which is nearly completely empty. Mild diffuse bladder wall thickening and mucosal enhancement is seen, consistent with cystitis. Stomach/Bowel: No evidence of obstruction, inflammatory process, or abnormal fluid collections. Diverticulosis is seen predominately involving the descending and sigmoid colon, however there is no evidence of diverticulitis . Normal appendix visualized. Vascular/Lymphatic: No pathologically enlarged lymph nodes identified. No abdominal aortic aneurysm. Aortic atherosclerosis. Reproductive: Normal size prostate gland with brachytherapy seeds. Unremarkable seminal vesicles. Other:  None. Musculoskeletal:  No suspicious bone lesions identified. IMPRESSION: Diffuse cystitis involving the urinary bladder. Foley catheter in place. No evidence of lymphadenopathy or metastatic disease within the chest, abdomen, or pelvis. 2.6 cm left thyroid lobe nodule. Consider thyroid ultrasound for further evaluation. This recommendation follows ACR consensus guidelines: Managing Incidental Thyroid Nodules Detected on Imaging: White Paper of the ACR Incidental Thyroid Findings Committee. J Am Coll Radiol 2015;12(2):143-150. Colonic diverticulosis. No radiographic evidence of diverticulitis. Aortic atherosclerosis. Electronically Signed   By: JEarle GellM.D.   On: 04/21/2017 07:28   Ct Abdomen Pelvis W Contrast  Result Date: 04/21/2017 CLINICAL DATA:  Non-Hodgkin's lymphoma. Hematuria and urinary retention. Constipation. Personal history of prostate carcinoma. EXAM: CT CHEST, ABDOMEN, AND PELVIS WITH  CONTRAST TECHNIQUE: Multidetector CT imaging of the chest, abdomen and pelvis was performed following the standard  protocol during bolus administration of intravenous contrast. CONTRAST:  100mL ISOVUE-300 IOPAMIDOL (ISOVUE-300) INJECTION 61% COMPARISON:  AP CT only on 06/09/2011 FINDINGS: CT CHEST FINDINGS Cardiovascular: No acute findings. Mediastinum/Lymph Nodes: 2.6 cm solid nodule in left thyroid lobe. No other masses or lymphadenopathy identified. Lungs/Pleura: Mild biapical and bilateral lower lobe scarring. No pulmonary infiltrate or mass identified. No effusion present. Musculoskeletal:  No suspicious bone lesions identified. CT ABDOMEN AND PELVIS FINDINGS Hepatobiliary: No masses identified. Gallbladder is unremarkable. Pancreas:  No mass or inflammatory changes. Spleen:  Within normal limits in size and appearance. Adrenals/Urinary tract: No masses or hydronephrosis. Tiny bilateral renal cysts again noted. Foley catheter seen within the bladder which is nearly completely empty. Mild diffuse bladder wall thickening and mucosal enhancement is seen, consistent with cystitis. Stomach/Bowel: No evidence of obstruction, inflammatory process, or abnormal fluid collections. Diverticulosis is seen predominately involving the descending and sigmoid colon, however there is no evidence of diverticulitis . Normal appendix visualized. Vascular/Lymphatic: No pathologically enlarged lymph nodes identified. No abdominal aortic aneurysm. Aortic atherosclerosis. Reproductive: Normal size prostate gland with brachytherapy seeds. Unremarkable seminal vesicles. Other:  None. Musculoskeletal:  No suspicious bone lesions identified. IMPRESSION: Diffuse cystitis involving the urinary bladder. Foley catheter in place. No evidence of lymphadenopathy or metastatic disease within the chest, abdomen, or pelvis. 2.6 cm left thyroid lobe nodule. Consider thyroid ultrasound for further evaluation. This recommendation follows ACR consensus guidelines: Managing Incidental Thyroid Nodules Detected on Imaging: White Paper of the ACR Incidental Thyroid  Findings Committee. J Am Coll Radiol 2015;12(2):143-150. Colonic diverticulosis. No radiographic evidence of diverticulitis. Aortic atherosclerosis. Electronically Signed   By: John  Stahl M.D.   On: 04/21/2017 07:28   Ct Biopsy  Result Date: 04/21/2017 CLINICAL DATA:  Pancytopenia of uncertain etiology. EXAM: CT GUIDED DEEP ILIAC BONE ASPIRATION AND CORE BIOPSY TECHNIQUE: The procedure, risks (including but not limited to bleeding, infection, organ damage ), benefits, and alternatives were explained to the patient. Questions regarding the procedure were encouraged and answered. The patient understands and consents to the procedure. Patient was placed supine on the CT gantry and limited axial scans through the pelvis were obtained. Appropriate skin entry site was identified. Skin site was marked, prepped with chlorhexidine, draped in usual sterile fashion, and infiltrated locally with 1% lidocaine. Intravenous Fentanyl and Versed were administered as conscious sedation during continuous monitoring of the patient's level of consciousness and physiological / cardiorespiratory status by the radiology RN, with a total moderate sedation time of 10 minutes. Under CT fluoroscopic guidance an 11-gauge Cook trocar bone needle was advanced into the right iliac bone just lateral to the sacroiliac joint. Once needle tip position was confirmed, core and aspiration samples were obtained, submitted to pathology for approval. Post procedure scans show no hematoma or fracture. Patient tolerated procedure well. COMPLICATIONS: COMPLICATIONS none IMPRESSION: 1. Technically successful CT guided right iliac bone core and aspiration biopsy. Electronically Signed   By: D  Hassell M.D.   On: 04/21/2017 11:24   Ct Bone Marrow Biopsy & Aspiration  Result Date: 04/21/2017 CLINICAL DATA:  Pancytopenia of uncertain etiology. EXAM: CT GUIDED DEEP ILIAC BONE ASPIRATION AND CORE BIOPSY TECHNIQUE: The procedure, risks (including but not  limited to bleeding, infection, organ damage ), benefits, and alternatives were explained to the patient. Questions regarding the procedure were encouraged and answered. The patient understands and consents to the procedure. Patient was placed supine on the   CT gantry and limited axial scans through the pelvis were obtained. Appropriate skin entry site was identified. Skin site was marked, prepped with chlorhexidine, draped in usual sterile fashion, and infiltrated locally with 1% lidocaine. Intravenous Fentanyl and Versed were administered as conscious sedation during continuous monitoring of the patient's level of consciousness and physiological / cardiorespiratory status by the radiology RN, with a total moderate sedation time of 10 minutes. Under CT fluoroscopic guidance an 11-gauge Cook trocar bone needle was advanced into the right iliac bone just lateral to the sacroiliac joint. Once needle tip position was confirmed, core and aspiration samples were obtained, submitted to pathology for approval. Post procedure scans show no hematoma or fracture. Patient tolerated procedure well. COMPLICATIONS: COMPLICATIONS none IMPRESSION: 1. Technically successful CT guided right iliac bone core and aspiration biopsy. Electronically Signed   By: D  Hassell M.D.   On: 04/21/2017 11:24      Subjective: Patient reports some minor bleeding overnight that was self-limiting. No gross hematuria.  Discharge Exam: Vitals:   04/22/17 0516 04/22/17 1256  BP: 108/72 129/64  Pulse: 75 84  Resp: 18 18  Temp: 98.4 F (36.9 C) 97.8 F (36.6 C)   Vitals:   04/21/17 1253 04/21/17 1932 04/22/17 0516 04/22/17 1256  BP: 109/73 137/72 108/72 129/64  Pulse: 76 90 75 84  Resp:  18 18 18  Temp:  98 F (36.7 C) 98.4 F (36.9 C) 97.8 F (36.6 C)  TempSrc:  Oral Oral Oral  SpO2:  98% 96% 99%  Weight:      Height:        General: Pt is alert, awake, not in acute distress Cardiovascular: RRR, S1/S2 +, no rubs, no  gallops Respiratory: CTA bilaterally, no wheezing, no rhonchi Abdominal: Soft, NT, ND, bowel sounds + GU: dried blood over foley tube Extremities: no edema, no cyanosis    The results of significant diagnostics from this hospitalization (including imaging, microbiology, ancillary and laboratory) are listed below for reference.     Microbiology: Recent Results (from the past 240 hour(s))  TECHNOLOGIST REVIEW     Status: None   Collection Time: 04/19/17 12:54 PM  Result Value Ref Range Status   Technologist Review   Final    mod poly and ovalos, few teardrops, schistocytes, acanthos and helmet cells   Culture, Urine     Status: Abnormal (Preliminary result)   Collection Time: 04/19/17 10:19 PM  Result Value Ref Range Status   Specimen Description URINE, CATHETERIZED  Final   Special Requests NONE  Final   Culture (A)  Final    >=100,000 COLONIES/mL KLEBSIELLA OXYTOCA >=100,000 COLONIES/mL PSEUDOMONAS AERUGINOSA    Report Status PENDING  Incomplete     Labs: BNP (last 3 results) No results for input(s): BNP in the last 8760 hours. Basic Metabolic Panel:  Recent Labs Lab 04/19/17 1254 04/19/17 1803 04/20/17 0456 04/21/17 0537  NA 143 139 142 139  K 4.0 3.6 4.1 4.1  CL  --  103 106 103  CO2 29 28 31 29  GLUCOSE 93 158* 107* 104*  BUN 16.2 17 15 18  CREATININE 1.1 1.01 1.00 1.08  CALCIUM 9.5 8.5* 8.5* 8.7*   Liver Function Tests:  Recent Labs Lab 04/19/17 1254  AST 19  ALT 25  ALKPHOS 60  BILITOT 0.59  PROT 7.5  ALBUMIN 3.9   No results for input(s): LIPASE, AMYLASE in the last 168 hours. No results for input(s): AMMONIA in the last 168 hours. CBC:  Recent   Labs Lab 04/19/17 1254 04/19/17 1803 04/20/17 0456 04/21/17 0537 04/22/17 1314  WBC 2.5* 2.1* 2.2* 2.5* 2.2*  NEUTROABS 1.3* 1.2*  --  1.1*  --   HGB 9.8* 9.0* 8.4* 8.8* 9.5*  HCT 30.8*  29.0* 27.4* 25.8* 26.9* 29.0*  MCV 86.0 82.8 85.1 84.1 82.6  PLT 32* 26* 55* 52* 55*   Cardiac  Enzymes: No results for input(s): CKTOTAL, CKMB, CKMBINDEX, TROPONINI in the last 168 hours. BNP: Invalid input(s): POCBNP CBG: No results for input(s): GLUCAP in the last 168 hours. D-Dimer No results for input(s): DDIMER in the last 72 hours. Hgb A1c No results for input(s): HGBA1C in the last 72 hours. Lipid Profile No results for input(s): CHOL, HDL, LDLCALC, TRIG, CHOLHDL, LDLDIRECT in the last 72 hours. Thyroid function studies No results for input(s): TSH, T4TOTAL, T3FREE, THYROIDAB in the last 72 hours.  Invalid input(s): FREET3 Anemia work up No results for input(s): VITAMINB12, FOLATE, FERRITIN, TIBC, IRON, RETICCTPCT in the last 72 hours. Urinalysis    Component Value Date/Time   COLORURINE YELLOW 04/19/2017 2219   APPEARANCEUR CLOUDY (A) 04/19/2017 2219   LABSPEC 1.013 04/19/2017 2219   PHURINE 7.0 04/19/2017 2219   GLUCOSEU NEGATIVE 04/19/2017 2219   HGBUR LARGE (A) 04/19/2017 2219   BILIRUBINUR NEGATIVE 04/19/2017 2219   KETONESUR NEGATIVE 04/19/2017 2219   PROTEINUR 100 (A) 04/19/2017 2219   UROBILINOGEN 0.2 03/29/2015 0611   NITRITE POSITIVE (A) 04/19/2017 2219   LEUKOCYTESUR LARGE (A) 04/19/2017 2219   Sepsis Labs Invalid input(s): PROCALCITONIN,  WBC,  LACTICIDVEN Microbiology Recent Results (from the past 240 hour(s))  TECHNOLOGIST REVIEW     Status: None   Collection Time: 04/19/17 12:54 PM  Result Value Ref Range Status   Technologist Review   Final    mod poly and ovalos, few teardrops, schistocytes, acanthos and helmet cells   Culture, Urine     Status: Abnormal (Preliminary result)   Collection Time: 04/19/17 10:19 PM  Result Value Ref Range Status   Specimen Description URINE, CATHETERIZED  Final   Special Requests NONE  Final   Culture (A)  Final    >=100,000 COLONIES/mL KLEBSIELLA OXYTOCA >=100,000 COLONIES/mL PSEUDOMONAS AERUGINOSA    Report Status PENDING  Incomplete     Time coordinating discharge: Over 30  minutes  SIGNED:    , MD [facility] 04/22/2017, 3:48 PM Pager [phone]  If 7PM-7AM, please contact night-coverage [email or web address] Password [email or web address] 

## 2017-04-23 LAB — URINE CULTURE: Culture: >=100000 — AB

## 2017-04-26 ENCOUNTER — Inpatient Hospital Stay (HOSPITAL_COMMUNITY)
Admission: EM | Admit: 2017-04-26 | Discharge: 2017-05-05 | DRG: 699 | Disposition: A | Discharge status: Home / Self Care | Payer: Medicare Other | Attending: Internal Medicine | Admitting: Internal Medicine

## 2017-04-26 ENCOUNTER — Other Ambulatory Visit: Payer: Self-pay | Admitting: Oncology

## 2017-04-26 ENCOUNTER — Encounter (HOSPITAL_COMMUNITY): Payer: Self-pay | Admitting: Emergency Medicine

## 2017-04-26 DIAGNOSIS — N3041 Irradiation cystitis with hematuria: Secondary | ICD-10-CM | POA: Diagnosis present

## 2017-04-26 DIAGNOSIS — Y92019 Unspecified place in single-family (private) house as the place of occurrence of the external cause: Secondary | ICD-10-CM

## 2017-04-26 DIAGNOSIS — Z888 Allergy status to other drugs, medicaments and biological substances status: Secondary | ICD-10-CM

## 2017-04-26 DIAGNOSIS — Z818 Family history of other mental and behavioral disorders: Secondary | ICD-10-CM | POA: Diagnosis not present

## 2017-04-26 DIAGNOSIS — Z801 Family history of malignant neoplasm of trachea, bronchus and lung: Secondary | ICD-10-CM

## 2017-04-26 DIAGNOSIS — R310 Gross hematuria: Secondary | ICD-10-CM | POA: Diagnosis present

## 2017-04-26 DIAGNOSIS — Z800 Family history of malignant neoplasm of digestive organs: Secondary | ICD-10-CM

## 2017-04-26 DIAGNOSIS — Z808 Family history of malignant neoplasm of other organs or systems: Secondary | ICD-10-CM

## 2017-04-26 DIAGNOSIS — L111 Transient acantholytic dermatosis [Grover]: Secondary | ICD-10-CM | POA: Diagnosis present

## 2017-04-26 DIAGNOSIS — N358 Other urethral stricture: Secondary | ICD-10-CM | POA: Diagnosis present

## 2017-04-26 DIAGNOSIS — E890 Postprocedural hypothyroidism: Secondary | ICD-10-CM | POA: Diagnosis present

## 2017-04-26 DIAGNOSIS — Z803 Family history of malignant neoplasm of breast: Secondary | ICD-10-CM | POA: Diagnosis not present

## 2017-04-26 DIAGNOSIS — R0600 Dyspnea, unspecified: Secondary | ICD-10-CM | POA: Diagnosis not present

## 2017-04-26 DIAGNOSIS — Z974 Presence of external hearing-aid: Secondary | ICD-10-CM | POA: Diagnosis not present

## 2017-04-26 DIAGNOSIS — F329 Major depressive disorder, single episode, unspecified: Secondary | ICD-10-CM | POA: Diagnosis present

## 2017-04-26 DIAGNOSIS — Z806 Family history of leukemia: Secondary | ICD-10-CM | POA: Diagnosis not present

## 2017-04-26 DIAGNOSIS — D62 Acute posthemorrhagic anemia: Secondary | ICD-10-CM | POA: Diagnosis present

## 2017-04-26 DIAGNOSIS — B965 Pseudomonas (aeruginosa) (mallei) (pseudomallei) as the cause of diseases classified elsewhere: Secondary | ICD-10-CM | POA: Diagnosis present

## 2017-04-26 DIAGNOSIS — N359 Urethral stricture, unspecified: Secondary | ICD-10-CM | POA: Diagnosis not present

## 2017-04-26 DIAGNOSIS — Z8546 Personal history of malignant neoplasm of prostate: Secondary | ICD-10-CM

## 2017-04-26 DIAGNOSIS — Z87891 Personal history of nicotine dependence: Secondary | ICD-10-CM

## 2017-04-26 DIAGNOSIS — S3730XA Unspecified injury of urethra, initial encounter: Secondary | ICD-10-CM | POA: Diagnosis present

## 2017-04-26 DIAGNOSIS — H9190 Unspecified hearing loss, unspecified ear: Secondary | ICD-10-CM | POA: Diagnosis present

## 2017-04-26 DIAGNOSIS — Z8572 Personal history of non-Hodgkin lymphomas: Secondary | ICD-10-CM

## 2017-04-26 DIAGNOSIS — C61 Malignant neoplasm of prostate: Secondary | ICD-10-CM | POA: Diagnosis not present

## 2017-04-26 DIAGNOSIS — Y33XXXA Other specified events, undetermined intent, initial encounter: Secondary | ICD-10-CM | POA: Diagnosis present

## 2017-04-26 DIAGNOSIS — D61818 Other pancytopenia: Secondary | ICD-10-CM | POA: Diagnosis present

## 2017-04-26 DIAGNOSIS — Y842 Radiological procedure and radiotherapy as the cause of abnormal reaction of the patient, or of later complication, without mention of misadventure at the time of the procedure: Secondary | ICD-10-CM | POA: Diagnosis present

## 2017-04-26 DIAGNOSIS — Z885 Allergy status to narcotic agent status: Secondary | ICD-10-CM | POA: Diagnosis not present

## 2017-04-26 DIAGNOSIS — B961 Klebsiella pneumoniae [K. pneumoniae] as the cause of diseases classified elsewhere: Secondary | ICD-10-CM | POA: Diagnosis present

## 2017-04-26 DIAGNOSIS — E041 Nontoxic single thyroid nodule: Secondary | ICD-10-CM | POA: Diagnosis present

## 2017-04-26 DIAGNOSIS — H9313 Tinnitus, bilateral: Secondary | ICD-10-CM | POA: Diagnosis present

## 2017-04-26 LAB — CBC WITH DIFFERENTIAL/PLATELET
BASOS ABS: 0 10*3/uL (ref 0.0–0.1)
Basophils Relative: 0 %
EOS ABS: 0 10*3/uL (ref 0.0–0.7)
Eosinophils Relative: 1 %
HCT: 26.6 % — ABNORMAL LOW (ref 39.0–52.0)
HEMOGLOBIN: 8.6 g/dL — AB (ref 13.0–17.0)
LYMPHS PCT: 43 %
Lymphs Abs: 1 10*3/uL (ref 0.7–4.0)
MCH: 27.1 pg (ref 26.0–34.0)
MCHC: 32.3 g/dL (ref 30.0–36.0)
MCV: 83.9 fL (ref 78.0–100.0)
Monocytes Absolute: 0.1 10*3/uL (ref 0.1–1.0)
Monocytes Relative: 3 %
NEUTROS ABS: 1.3 10*3/uL — AB (ref 1.7–7.7)
NEUTROS PCT: 53 %
Platelets: 36 10*3/uL — ABNORMAL LOW (ref 150–400)
RBC: 3.17 MIL/uL — AB (ref 4.22–5.81)
RDW: 20.4 % — ABNORMAL HIGH (ref 11.5–15.5)
WBC: 2.4 10*3/uL — AB (ref 4.0–10.5)

## 2017-04-26 LAB — MAGNESIUM: MAGNESIUM: 2.1 mg/dL (ref 1.7–2.4)

## 2017-04-26 LAB — FIBRINOGEN: FIBRINOGEN: 293 mg/dL (ref 210–475)

## 2017-04-26 LAB — I-STAT CHEM 8, ED
BUN: 17 mg/dL (ref 6–20)
CREATININE: 1 mg/dL (ref 0.61–1.24)
Calcium, Ion: 1.22 mmol/L (ref 1.15–1.40)
Chloride: 101 mmol/L (ref 101–111)
Glucose, Bld: 100 mg/dL — ABNORMAL HIGH (ref 65–99)
HEMATOCRIT: 27 % — AB (ref 39.0–52.0)
HEMOGLOBIN: 9.2 g/dL — AB (ref 13.0–17.0)
Potassium: 3.8 mmol/L (ref 3.5–5.1)
Sodium: 140 mmol/L (ref 135–145)
TCO2: 28 mmol/L (ref 0–100)

## 2017-04-26 LAB — PHOSPHORUS: PHOSPHORUS: 4.3 mg/dL (ref 2.5–4.6)

## 2017-04-26 LAB — APTT: APTT: 30 s (ref 24–36)

## 2017-04-26 LAB — PROTIME-INR
INR: 1.15
Prothrombin Time: 14.7 seconds (ref 11.4–15.2)

## 2017-04-26 MED ORDER — CIPROFLOXACIN HCL 500 MG PO TABS
500.0000 mg | ORAL_TABLET | Freq: Two times a day (BID) | ORAL | Status: DC
  Administered 2017-04-26 – 2017-04-27 (×2): 500 mg via ORAL
  Filled 2017-04-26 (×2): qty 1

## 2017-04-26 MED ORDER — GABAPENTIN 100 MG PO CAPS
200.0000 mg | ORAL_CAPSULE | Freq: Three times a day (TID) | ORAL | Status: DC
  Administered 2017-04-26 – 2017-04-28 (×6): 200 mg via ORAL
  Filled 2017-04-26 (×6): qty 2

## 2017-04-26 MED ORDER — NORTRIPTYLINE HCL 25 MG PO CAPS
25.0000 mg | ORAL_CAPSULE | Freq: Every day | ORAL | Status: DC
  Administered 2017-04-26 – 2017-05-04 (×9): 25 mg via ORAL
  Filled 2017-04-26 (×12): qty 1

## 2017-04-26 MED ORDER — TRAMADOL HCL 50 MG PO TABS
50.0000 mg | ORAL_TABLET | Freq: Four times a day (QID) | ORAL | Status: DC | PRN
  Administered 2017-04-27: 50 mg via ORAL
  Filled 2017-04-26 (×2): qty 1

## 2017-04-26 MED ORDER — LORAZEPAM 1 MG PO TABS
1.0000 mg | ORAL_TABLET | Freq: Once | ORAL | Status: AC
  Administered 2017-04-26: 1 mg via ORAL
  Filled 2017-04-26: qty 1

## 2017-04-26 MED ORDER — SODIUM CHLORIDE 0.9 % IV SOLN
INTRAVENOUS | Status: DC
  Administered 2017-04-26: 1000 mL via INTRAVENOUS

## 2017-04-26 MED ORDER — SODIUM CHLORIDE 0.9 % IV SOLN
Freq: Once | INTRAVENOUS | Status: AC
  Administered 2017-04-26: 250 mL via INTRAVENOUS

## 2017-04-26 MED ORDER — OXYCODONE-ACETAMINOPHEN 7.5-325 MG PO TABS
0.5000 | ORAL_TABLET | Freq: Three times a day (TID) | ORAL | Status: DC | PRN
  Administered 2017-04-26 – 2017-05-05 (×11): 1 via ORAL
  Filled 2017-04-26 (×11): qty 1

## 2017-04-26 MED ORDER — POLYETHYLENE GLYCOL 3350 17 G PO PACK: 17.0000 g | PACK | Freq: Every day | ORAL | Status: DC | PRN

## 2017-04-26 MED ORDER — TEMAZEPAM 15 MG PO CAPS
15.0000 mg | ORAL_CAPSULE | Freq: Every evening | ORAL | Status: DC | PRN
  Administered 2017-04-26 – 2017-05-04 (×9): 15 mg via ORAL
  Filled 2017-04-26 (×9): qty 1

## 2017-04-26 MED ORDER — TRIAMCINOLONE ACETONIDE 0.1 % EX CREA
1.0000 "application " | TOPICAL_CREAM | Freq: Every morning | CUTANEOUS | Status: DC
  Administered 2017-04-29 – 2017-05-05 (×8): 1 via TOPICAL
  Filled 2017-04-26 (×4): qty 15

## 2017-04-26 NOTE — ED Notes (Signed)
Transporter called to transport patient.

## 2017-04-26 NOTE — H&P (Signed)
History and Physical    Jordan Terry MEQ:[id number] DOB: 07-26-1932 DOA: 04/26/2017  PCP: [name], MD   Patient coming from: Home.  I have personally briefly reviewed patient's old medical records in [place]  Chief Complaint: Home.  HPI: Jordan Terry is a 83 y.o. male with medical history significant of anginal pain, anxiety, circumflex arthritis, bilateral tinnitus, vertigo, prostate cancer, history of headaches, Grover's disease who came to the emergency department from neurologist's office for persistent rectal bleeding. He had a similar episode last week, who underwent a transfusion of 1 unit of platelets and CT-guided bone marrow biopsy. He mentions that since yesterday the bleeding has increased significantly. He has felt lightheaded. He denies fever, chills, chest pain, palpitations, diaphoresis, PND, orthopnea, but states he has occasional pitting dyspnea on exertion and edema lower extremities. He denies abdominal pain, nausea, emesis, diarrhea, constipation, melena, hematochezia, dysuria or frequency. He denies using aspirin or herbal supplements.  ED Course: Initial vital signs temperature 98.95F, pulse 93, respirations 16, blood pressure 116/80 mmHg and O2 sat 99% on room air. Workup in the ER shows WBC of 2.4, hemoglobin 8.6 g/dL (decreased from 9.5 last week), platelets 36. PT/INR and PTT are still pending. Dr. Kathrynn Humble spoke to urology which did not have any new recommendations. I spoke to Dr. Jana Hakim recommended platelet transfusion.  Review of Systems: As per HPI otherwise 10 point review of systems negative.    Past Medical History:  Diagnosis Date  . Anginal pain ([place]) 20 years ago  . Anxiety   . Arthritis   . Bilateral tinnitus    wears hearing aides  . Cancer [facility])    prostate  . Grover's disease   . Headache(784.0)    not since retired  . Hematuria 04/14/2017  . Self-catheterizes urinary bladder 04/19/2017  . Vertigo     Past Surgical  History:  Procedure Laterality Date  . CARDIAC CATHETERIZATION  "20 years ago"  . CARPAL TUNNEL RELEASE Left 2008  . CATARACT EXTRACTION Bilateral 2014  . CYSTOSCOPY WITH URETHRAL DILATATION N/A 08/14/2013   Procedure: CYSTOSCOPY WITH URETHRAL DILATATION BALLOON DILATION OF URETHRAL STRICTURE;  Surgeon: Dutch Gray, MD;  Location: [facility];  Service: Urology;  Laterality: N/A;  BALLOON DILATION   . CYSTOSCOPY WITH URETHRAL DILATATION N/A 05/07/2014   Procedure: CYSTOSCOPY WITH BALLOON DILATION OF URETHRAL STRICTURE;  Surgeon: Raynelle Bring, MD;  Location: [facility];  Service: Urology;  Laterality: N/A;  [place]   "scope"  . NOSE SURGERY  1991, 1998  . RADIOACTIVE SEED IMPLANT  2002   "for prostate cancer"  . SHOULDER SURGERY  2012   right  . THYROIDECTOMY, PARTIAL  1974  . VASECTOMY  1970     reports that he quit smoking about 39 years ago. His smoking use included Cigars. He quit after 10.00 years of use. He has never used smokeless tobacco. He reports that he does not drink alcohol or use drugs.  Allergies  Allergen Reactions  . Benadryl [Diphenhydramine Hcl] Anxiety    MAKES HIM GO CRAZY  . Hydrocodone Itching    Small amounts are okay    Family History  Problem Relation Age of Onset  . Anxiety disorder Mother   . Cancer Father        colon, lung  . Diabetes Brother   . Cancer Brother   . Diabetes Sister   . Leukemia Sister   .  Cancer Sister        breast  . Cancer Sister        breast  . Cancer Sister        thyroid  . Diabetes Sister     Prior to Admission medications   Medication Sig Start Date End Date Taking? Authorizing Provider  ciprofloxacin (CIPRO) 500 MG tablet Take 1 tablet (500 mg total) by mouth 2 (two) times daily. 04/22/17 04/29/17 Yes Mariel Aloe, MD  gabapentin (NEURONTIN) 100 MG capsule Take 200 mg by mouth 3 (three) times daily.    Yes [provider]  LORazepam (ATIVAN) 0.5 MG  tablet Take 1 mg by mouth at bedtime.   Yes [provider]  nortriptyline (PAMELOR) 25 MG capsule Take 1 capsule (25 mg total) by mouth at bedtime. 01/19/17  Yes Dennie Bible, NP  oxyCODONE-acetaminophen (PERCOCET) 7.5-325 MG per tablet Take 0.5-1 tablets by mouth every 8 (eight) hours as needed (pain.).    Yes [provider]  polyethylene glycol (MIRALAX / GLYCOLAX) packet Take 17 g by mouth daily. Patient taking differently: Take 17 g by mouth daily as needed for mild constipation or moderate constipation.  04/23/17  Yes Mariel Aloe, MD  temazepam (RESTORIL) 15 MG capsule Take 15 mg by mouth at bedtime.   Yes [provider]  tetrahydrozoline-zinc (VISINE-AC) 0.05-0.25 % ophthalmic solution Place 1 drop into both eyes daily as needed (allergies).   Yes [provider]  traMADol (ULTRAM) 50 MG tablet Take 50 mg by mouth every 6 (six) hours as needed for moderate pain.    Yes [provider]  triamcinolone cream (KENALOG) 0.1 % Apply 1 application topically every morning. APPLY TO CHEST AND BACK EVERY MORNING FOR GROVER DISEASE   Yes [provider]  docusate sodium (COLACE) 100 MG capsule Take 1 capsule (100 mg total) by mouth daily. Patient not taking: Reported on 04/26/2017 04/23/17   Mariel Aloe, MD    Physical Exam: Vitals:   04/26/17 1128 04/26/17 1449  BP: 136/83 116/80  Pulse: 93 80  Resp: 16 16  Temp: 98.1 F (36.7 C) 98.1 F (36.7 C)  TempSrc: Oral Oral  SpO2: 99% 99%    Constitutional: NAD, calm, comfortable Eyes: PERRL, lids and conjunctivae normal ENMT: Mucous membranes are moist. Posterior pharynx clear of any exudate or lesions. Neck: normal, supple, no masses, no thyromegaly Respiratory: clear to auscultation bilaterally, no wheezing, no crackles. Normal respiratory effort. No accessory muscle use.  Cardiovascular: Regular rate and rhythm, no murmurs / rubs / gallops. Trace lower extremity edema. 2+  pedal pulses. No carotid bruits. Abdomen: Soft, no tenderness, no masses palpated. No hepatosplenomegaly. Bowel sounds positive.  GU: Foley catheter in place showing bleeding meatus with several blood clots on dressings. Musculoskeletal: no clubbing / cyanosis. Good ROM, no contractures. Normal muscle tone.  Skin: Scattered areas of erythema. Neurologic: CN 2-12 grossly intact. Sensation intact, DTR normal. Strength 5/5 in all 4.  Psychiatric: Normal judgment and insight. Alert and oriented x 3. Normal mood.    Labs on Admission: I have personally reviewed following labs and imaging studies  CBC:  Recent Labs Lab 04/19/17 1803 04/20/17 0456 04/21/17 0537 04/22/17 1314 04/26/17 1351 04/26/17 1405  WBC 2.1* 2.2* 2.5* 2.2* 2.4*  --   NEUTROABS 1.2*  --  1.1*  --  1.3*  --   HGB 9.0* 8.4* 8.8* 9.5* 8.6* 9.2*  HCT 27.4* 25.8* 26.9* 29.0* 26.6* 27.0*  MCV 82.8 85.1  84.1 82.6 83.9  --   PLT 26* 55* 52* 55* 36*  --    Basic Metabolic Panel:  Recent Labs Lab 04/19/17 1803 04/20/17 0456 04/21/17 0537 04/26/17 1405  NA 139 142 139 140  K 3.6 4.1 4.1 3.8  CL 103 106 103 101  CO2 '28 31 29  ' --   GLUCOSE 158* 107* 104* 100*  BUN '17 15 18 17  ' CREATININE 1.01 1.00 1.08 1.00  CALCIUM 8.5* 8.5* 8.7*  --    GFR: Estimated Creatinine Clearance: 62.3 mL/min (by C-G formula based on SCr of 1 mg/dL). Liver Function Tests: No results for input(s): AST, ALT, ALKPHOS, BILITOT, PROT, ALBUMIN in the last 168 hours. No results for input(s): LIPASE, AMYLASE in the last 168 hours. No results for input(s): AMMONIA in the last 168 hours. Coagulation Profile:  Recent Labs Lab 04/19/17 1803  INR 1.04   Cardiac Enzymes: No results for input(s): CKTOTAL, CKMB, CKMBINDEX, TROPONINI in the last 168 hours. BNP (last 3 results) No results for input(s): PROBNP in the last 8760 hours. HbA1C: No results for input(s): HGBA1C in the last 72 hours. CBG: No results for input(s): GLUCAP in the last  168 hours. Lipid Profile: No results for input(s): CHOL, HDL, LDLCALC, TRIG, CHOLHDL, LDLDIRECT in the last 72 hours. Thyroid Function Tests: No results for input(s): TSH, T4TOTAL, FREET4, T3FREE, THYROIDAB in the last 72 hours. Anemia Panel: No results for input(s): VITAMINB12, FOLATE, FERRITIN, TIBC, IRON, RETICCTPCT in the last 72 hours. Urine analysis:    Component Value Date/Time   COLORURINE YELLOW 04/19/2017 2219   APPEARANCEUR CLOUDY (A) 04/19/2017 2219   LABSPEC 1.013 04/19/2017 2219   PHURINE 7.0 04/19/2017 2219   GLUCOSEU NEGATIVE 04/19/2017 2219   HGBUR LARGE (A) 04/19/2017 2219   BILIRUBINUR NEGATIVE 04/19/2017 2219   [place] 04/19/2017 2219   PROTEINUR 100 (A) 04/19/2017 2219   UROBILINOGEN 0.2 03/29/2015 0611   NITRITE POSITIVE (A) 04/19/2017 2219   LEUKOCYTESUR LARGE (A) 04/19/2017 2219    Radiological Exams on Admission: No results found.  EKG: Independently reviewed.  Assessment/Plan Principal Problem:   Gross hematuria Secondary to thrombocytopenia. Transfuse a unit of platelets. Monitor platelets, hematocrit and hemoglobin. No intervention by urology at this time.  Active Problems:   Dyspnea Also trace edema on physical exam. Check EKG in a.m. Check echocardiogram in a.m.    Prostate cancer [facility]) Continue follow-up as per urology.    Pancytopenia ([place]) Bone marrow biopsy still pending. He is scheduled to see hematology later this month. Monitor CBC in the meantime.    Grover's disease Continue Kenalog cream.    DVT prophylaxis: SCDs. Code Status: Full code. Family Communication: His wife and daughter were present in the knee. Disposition Plan:  Consults called: Consulted Dr.Magrinat over the phone who approved a unit of platelets to be transfused and follow blood work for next week. He is scheduled to see Dr. Irene Limbo at the clinic later this month on 05/17/2017. Admission status: Inpatient/Medsurg   Reubin Milan  MD [facility] Pager [phone]  If 7PM-7AM, please contact night-coverage [email or web address] Password [facility]  04/26/2017, 4:44 PM

## 2017-04-26 NOTE — ED Triage Notes (Signed)
Patient was at Urology office today to get catheter changed but having too much blood in cath and around catheter so sent to ED for further evaluation. Patient had platelet transfusion last week.

## 2017-04-26 NOTE — ED Notes (Signed)
Bed: WA17 Expected date:  Expected time:  Means of arrival:  Comments: Triage 2  

## 2017-04-26 NOTE — ED Provider Notes (Addendum)
[place] DEPT Provider Note   CSN: [id number] Arrival date & time: 04/26/17  1123     History   Chief Complaint Chief Complaint  Patient presents with  . sent from Urology for bleeding    HPI Jordan Terry is a 83 y.o. male.  HPI  83 y.o.malewith history of prostate cancer in remission, thrombocytopenia comes to the ER with bloody urine. On 6/20 pt had a foley catheter placed. He then needed admission for heavy bleeding and required platelet transfusion and was discharged 6/28.  Today the bleeding had returned, and is severe and there is bleeding around the catheter. Pt went to Urology clinic, there was bandage applied, and pt was discharged, but family is concerned. PT is still draining in the foley bag - but it is reduced.  Past Medical History:  Diagnosis Date  . Anginal pain ([place]) 20 years ago  . Anxiety   . Arthritis   . Bilateral tinnitus    wears hearing aides  . Cancer [facility])    prostate  . Grover's disease   . Headache(784.0)    not since retired  . Hematuria 04/14/2017  . Self-catheterizes urinary bladder 04/19/2017  . Vertigo     Patient Active Problem List   Diagnosis Date Noted  . Gross hematuria 04/26/2017  . NHL (non-Hodgkin's lymphoma) ([place])   . Urethral bleeding 04/19/2017  . Pancytopenia ([place]) 04/19/2017  . Sepsis ([place]) 03/09/2016  . Prostate cancer ([place]) 03/08/2016  . Hematuria 03/08/2016  . Fever and chills 03/08/2016  . Fever 03/08/2016  . Abnormality of gait 05/31/2013  . Dizziness and giddiness 05/31/2013    Past Surgical History:  Procedure Laterality Date  . CARDIAC CATHETERIZATION  "20 years ago"  . CARPAL TUNNEL RELEASE Left 2008  . CATARACT EXTRACTION Bilateral 2014  . CYSTOSCOPY WITH URETHRAL DILATATION N/A 08/14/2013   Procedure: CYSTOSCOPY WITH URETHRAL DILATATION BALLOON DILATION OF URETHRAL STRICTURE;  Surgeon: Dutch Gray, MD;  Location: [facility];  Service: Urology;  Laterality: N/A;  BALLOON DILATION   . CYSTOSCOPY  WITH URETHRAL DILATATION N/A 05/07/2014   Procedure: CYSTOSCOPY WITH BALLOON DILATION OF URETHRAL STRICTURE;  Surgeon: Raynelle Bring, MD;  Location: [facility];  Service: Urology;  Laterality: N/A;  [place]   "scope"  . NOSE SURGERY  1991, 1998  . RADIOACTIVE SEED IMPLANT  2002   "for prostate cancer"  . SHOULDER SURGERY  2012   right  . THYROIDECTOMY, PARTIAL  1974  . VASECTOMY  1970       Home Medications    Prior to Admission medications   Medication Sig Start Date End Date Taking? Authorizing Provider  ciprofloxacin (CIPRO) 500 MG tablet Take 1 tablet (500 mg total) by mouth 2 (two) times daily. 04/22/17 04/29/17 Yes Mariel Aloe, MD  gabapentin (NEURONTIN) 100 MG capsule Take 200 mg by mouth 3 (three) times daily.    Yes [provider]  LORazepam (ATIVAN) 0.5 MG tablet Take 1 mg by mouth at bedtime.   Yes [provider]  nortriptyline (PAMELOR) 25 MG capsule Take 1 capsule (25 mg total) by mouth at bedtime. 01/19/17  Yes Dennie Bible, NP  oxyCODONE-acetaminophen (PERCOCET) 7.5-325 MG per tablet Take 0.5-1 tablets by mouth every 8 (eight) hours as needed (pain.).    Yes [provider]  polyethylene glycol (MIRALAX / GLYCOLAX) packet Take 17 g by mouth daily. Patient taking differently: Take 17 g by  mouth daily as needed for mild constipation or moderate constipation.  04/23/17  Yes Mariel Aloe, MD  temazepam (RESTORIL) 15 MG capsule Take 15 mg by mouth at bedtime.   Yes [provider]  tetrahydrozoline-zinc (VISINE-AC) 0.05-0.25 % ophthalmic solution Place 1 drop into both eyes daily as needed (allergies).   Yes [provider]  traMADol (ULTRAM) 50 MG tablet Take 50 mg by mouth every 6 (six) hours as needed for moderate pain.    Yes [provider]  triamcinolone cream (KENALOG) 0.1 % Apply 1 application topically every morning. APPLY TO CHEST AND BACK EVERY  MORNING FOR GROVER DISEASE   Yes [provider]  docusate sodium (COLACE) 100 MG capsule Take 1 capsule (100 mg total) by mouth daily. Patient not taking: Reported on 04/26/2017 04/23/17   Mariel Aloe, MD    Family History Family History  Problem Relation Age of Onset  . Anxiety disorder Mother   . Cancer Father        colon, lung  . Diabetes Brother   . Cancer Brother   . Diabetes Sister   . Leukemia Sister   . Cancer Sister        breast  . Cancer Sister        breast  . Cancer Sister        thyroid  . Diabetes Sister     Social History Social History  Substance Use Topics  . Smoking status: Former Smoker    Years: 10.00    Types: Cigars    Quit date: 1979  . Smokeless tobacco: Never Used  . Alcohol use No     Allergies   Benadryl [diphenhydramine hcl] and Hydrocodone   Review of Systems Review of Systems  Constitutional: Positive for activity change.  Genitourinary: Positive for hematuria.  All other systems reviewed and are negative.    Physical Exam Updated Vital Signs BP 116/80 (BP Location: Right Arm)   Pulse 80   Temp 98.1 F (36.7 C) (Oral)   Resp 16   SpO2 99%   Physical Exam  Constitutional: He is oriented to person, place, and time. He appears well-developed.  HENT:  Head: Normocephalic and atraumatic.  Eyes: Conjunctivae and EOM are normal. Pupils are equal, round, and reactive to light.  Neck: Normal range of motion. Neck supple.  Cardiovascular: Normal rate and regular rhythm.   Pulmonary/Chest: Effort normal and breath sounds normal.  Abdominal: Soft. Bowel sounds are normal. He exhibits no distension. There is no tenderness.  Genitourinary:  Genitourinary Comments: Pt has a 16 Fr foley catheter and there is a dressing around the penis. Blood seen arounf the meatus  Musculoskeletal: He exhibits no deformity.  Neurological: He is alert and oriented to person, place, and time.  Skin: Skin is warm.  Nursing note and  vitals reviewed.    ED Treatments / Results  Labs (all labs ordered are listed, but only abnormal results are displayed) Labs Reviewed  CBC WITH DIFFERENTIAL/PLATELET - Abnormal; Notable for the following:       Result Value   WBC 2.4 (*)    RBC 3.17 (*)    Hemoglobin 8.6 (*)    HCT 26.6 (*)    RDW 20.4 (*)    Platelets 36 (*)    Neutro Abs 1.3 (*)    All other components within normal limits  I-STAT CHEM 8, ED - Abnormal; Notable for the following:    Glucose, Bld 100 (*)  Hemoglobin 9.2 (*)    HCT 27.0 (*)    All other components within normal limits  PROTIME-INR  APTT  FIBRINOGEN  MAGNESIUM  PHOSPHORUS  TYPE AND SCREEN    EKG  EKG Interpretation None       Radiology No results found.  Procedures Procedures (including critical care time)  Medications Ordered in ED Medications - No data to display   Initial Impression / Assessment and Plan / ED Course  I have reviewed the triage vital signs and the nursing notes.  Pertinent labs & imaging results that were available during my care of the patient were reviewed by me and considered in my medical decision making (see chart for details).  Clinical Course as of Apr 27 1599  Mon Apr 26, 2017  1558 Spoke with Dr. Tresa Moore. He know the patient well. He reported that the bleeding is from the penis, and for now the plan is to keep the penis area wrapped with compression dressing and keep the foley in. Otherwise, Urology has no role. I will communicate this to Dr. Olevia Bowens from Medicine.  [AN]    Clinical Course User Index [AN] Varney Biles, MD   Pt comes in with cc of hematuria. He has hx of pancytopenia. We are not sure why there is bleeding, but the low platelets is certainly making things worse. Pt has required platelet transfusion in the past, and that has controlled his symptoms.   Platelet count is 36 today. Patient will need platelet transfusion and admission.  Results from the ER workup discussed with  the patient face to face and all questions answered to the best of my ability.     Final Clinical Impressions(s) / ED Diagnoses   Final diagnoses:  Pancytopenia ([place])  Gross hematuria    New Prescriptions New Prescriptions   No medications on file     Varney Biles, MD 04/26/17 [place], [place], MD 04/26/17 [place], Syerra Abdelrahman, MD 04/26/17 1600

## 2017-04-26 NOTE — Progress Notes (Unsigned)
I was called by Dr. Olevia Bowens from the emergency room where Jordan Terry presented with significant hematuria. He is down a point on his H&H. His platelet count is less than 40,000.  I suggested he go ahead and receive a unit of platelets which unfortunately we will not be able to do here. He will receive it in the emergency room.  I have arranged for him to have a visit and lab work next week. He does have an appointment with Dr. Irene Limbo for follow-up 05/17/2017.

## 2017-04-26 NOTE — ED Notes (Signed)
Leg bag was drained. This tech observed a large clot in the neck of the bag. I removed leg bag and dislodged the clot. I also check the catheter and did not observe any clots in the tubing. Catheter was not draining at this time also. I reattached the leg bag to see if patients catheter would now drain without clot in tubing. (Patient now had about 60cc in it, RN states its draining some.

## 2017-04-26 NOTE — ED Notes (Signed)
Patient was complaining of pain in penis, on assessment the patient had coban wrapped around penis. On removal of coban there was congealed blood around penis. This was removed and peri area was cleaned. MD to be notified.

## 2017-04-26 NOTE — ED Notes (Signed)
Attempted to call report. RN to call back. 

## 2017-04-26 NOTE — Progress Notes (Addendum)
Pt states he has Bertell Maria skin disease, he becomes itchy and bumps appear on his skin. Usually on his chest and back. Pet[place]ia noted on pt's chest.Pt HOH and does wear hearing aids. Blood noted draining in catheter bag. Denies having any pain. Pt to receive 1 unit of [place] tonight

## 2017-04-26 NOTE — ED Notes (Addendum)
Per patients family, the urology clinic did not send him over to the ED. The family felt it was necessary to get him checked out due to the blood in his leg bag.

## 2017-04-27 ENCOUNTER — Telehealth: Payer: Self-pay | Admitting: Hematology

## 2017-04-27 ENCOUNTER — Inpatient Hospital Stay (HOSPITAL_COMMUNITY): Payer: Medicare Other

## 2017-04-27 DIAGNOSIS — L111 Transient acantholytic dermatosis [Grover]: Secondary | ICD-10-CM

## 2017-04-27 DIAGNOSIS — R0600 Dyspnea, unspecified: Secondary | ICD-10-CM

## 2017-04-27 LAB — ECHOCARDIOGRAM COMPLETE
AO mean calculated velocity dopler: 109 cm/s
AOPV: 0.73 m/s
AOVTI: 30.5 cm
AV Area VTI index: 1.33 cm2/m2
AV Area VTI: 2.54 cm2
AV Area mean vel: 2.45 cm2
AV VEL mean LVOT/AV: 0.71
AV area mean vel ind: 1.22 cm2/m2
AV peak Index: 1.27
AVA: 2.67 cm2
AVG: 6 mmHg
AVPG: 11 mmHg
AVPHT: 470 ms
AVPKVEL: 166 cm/s
CHL CUP AV VEL: 2.67
CHL CUP DOP CALC LVOT VTI: 23.5 cm
E decel time: 211 msec
FS: 24 % — AB (ref 28–44)
IV/PV OW: 0.72
LADIAMINDEX: 1.6 cm/m2
LASIZE: 32 mm
LAVOL: 76.9 mL
LAVOLA4C: 80.1 mL
LAVOLIN: 38.4 mL/m2
LEFT ATRIUM END SYS DIAM: 32 mm
LV sys vol: 46 mL (ref 21–61)
LVDIAVOL: 92 mL (ref 62–150)
LVDIAVOLIN: 46 mL/m2
LVOT area: 3.46 cm2
LVOT diameter: 21 mm
LVOT peak grad rest: 6 mmHg
LVOT peak vel: 122 cm/s
LVOTSV: 81 mL
LVOTVTI: 0.77 cm
LVSYSVOLIN: 23 mL/m2
MV Dec: 211
MV Peak grad: 2 mmHg
MV pk A vel: 101 m/s
MVPKEVEL: 76.6 m/s
PW: 15.7 mm — AB (ref 0.6–1.1)
Simpson's disk: 51
Stroke v: 47 ml
TAPSE: 28.1 mm
Valve area index: 1.33

## 2017-04-27 LAB — CBC
HCT: 24.9 % — ABNORMAL LOW (ref 39.0–52.0)
HEMOGLOBIN: 8.5 g/dL — AB (ref 13.0–17.0)
MCH: 28.4 pg (ref 26.0–34.0)
MCHC: 34.1 g/dL (ref 30.0–36.0)
MCV: 83.3 fL (ref 78.0–100.0)
Platelets: 43 10*3/uL — ABNORMAL LOW (ref 150–400)
RBC: 2.99 MIL/uL — ABNORMAL LOW (ref 4.22–5.81)
RDW: 18.3 % — ABNORMAL HIGH (ref 11.5–15.5)
WBC: 2.2 10*3/uL — ABNORMAL LOW (ref 4.0–10.5)

## 2017-04-27 LAB — CBC WITH DIFFERENTIAL/PLATELET
BASOS ABS: 0 10*3/uL (ref 0.0–0.1)
Basophils Relative: 0 %
Eosinophils Absolute: 0 10*3/uL (ref 0.0–0.7)
Eosinophils Relative: 2 %
HCT: 20 % — ABNORMAL LOW (ref 39.0–52.0)
Hemoglobin: 6.7 g/dL — CL (ref 13.0–17.0)
LYMPHS ABS: 0.9 10*3/uL (ref 0.7–4.0)
Lymphocytes Relative: 49 %
MCH: 27.7 pg (ref 26.0–34.0)
MCHC: 33.5 g/dL (ref 30.0–36.0)
MCV: 82.6 fL (ref 78.0–100.0)
MONO ABS: 0 10*3/uL — AB (ref 0.1–1.0)
Monocytes Relative: 1 %
NEUTROS ABS: 1 10*3/uL — AB (ref 1.7–7.7)
Neutrophils Relative %: 48 %
PLATELETS: 53 10*3/uL — AB (ref 150–400)
RBC: 2.42 MIL/uL — AB (ref 4.22–5.81)
RDW: 19.9 % — AB (ref 11.5–15.5)
WBC: 1.9 10*3/uL — AB (ref 4.0–10.5)

## 2017-04-27 LAB — PREPARE PLATELET PHERESIS: Unit division: 0

## 2017-04-27 LAB — BPAM PLATELET PHERESIS
BLOOD PRODUCT EXPIRATION DATE: 201807042359
ISSUE DATE / TIME: 201807022122
UNIT TYPE AND RH: 6200

## 2017-04-27 LAB — PREPARE RBC (CROSSMATCH)

## 2017-04-27 MED ORDER — SODIUM CHLORIDE 0.9 % IV SOLN
Freq: Once | INTRAVENOUS | Status: AC
  Administered 2017-04-27: 18:00:00 via INTRAVENOUS

## 2017-04-27 MED ORDER — CIPROFLOXACIN HCL 500 MG PO TABS
500.0000 mg | ORAL_TABLET | Freq: Two times a day (BID) | ORAL | Status: AC
  Administered 2017-04-27: 500 mg via ORAL
  Filled 2017-04-27: qty 1

## 2017-04-27 MED ORDER — LIDOCAINE 5 % EX OINT
TOPICAL_OINTMENT | Freq: Four times a day (QID) | CUTANEOUS | Status: DC | PRN
  Administered 2017-04-27: 1 via TOPICAL
  Administered 2017-04-29: 14:00:00 via TOPICAL
  Filled 2017-04-27: qty 35.44

## 2017-04-27 MED ORDER — LORAZEPAM 1 MG PO TABS
1.0000 mg | ORAL_TABLET | Freq: Every day | ORAL | Status: DC
  Administered 2017-04-27 – 2017-05-04 (×8): 1 mg via ORAL
  Filled 2017-04-27 (×8): qty 1

## 2017-04-27 NOTE — Telephone Encounter (Signed)
lvm to inform pt of 7/6 appts per sch msg

## 2017-04-27 NOTE — Progress Notes (Signed)
Nursing Note: Bp 12/68 P-81 R-18 PO2 98% on 2L n/c and pt denies chest pain,says he was "almost asleep".A: Paged on-call and made aware that chest pain is resolved.wbb

## 2017-04-27 NOTE — Progress Notes (Signed)
Rapid Response Event Note  Overview: Patient complaining of chest pain. Patient has history of anginal pain.     Initial Focused Assessment: Patient complaining of mild mid-sternal chest pain 2/10. BP 121/68, HR 75, 98%RA RR 16. Pt. States he has experienced this type of pain in past and no different.    Interventions: EKG completed prior to arrival- NSR with 1st block, 2L [place] applied. After O2 applied, pt. states that chest pain is completely resolved.   Plan of Care (if not transferred): NP on call notified by primary nurse of event. No new orders given.   Event Summary:  04/27/2017 at  [place]    at          [facility]

## 2017-04-27 NOTE — Progress Notes (Signed)
*  PRELIMINARY RESULTS* Echocardiogram 2D Echocardiogram has been performed.  Samuel Germany 04/27/2017, 1:13 PM

## 2017-04-27 NOTE — Progress Notes (Addendum)
PROGRESS NOTE  Jordan Terry  WTU:[id number] DOB: 1932-07-06 DOA: 04/26/2017 PCP: [name], MD  Outpatient Specialists: Urology, Alinda Money  Brief Narrative: Jordan Terry is a 83 y.o. 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 He had a similar episode in the setting of thrombocytopenia a week prior requiring platelet transfusion and platelets again were found to be down to 36 on arrival. Hemoglobin had dropped to 8.6 from 9.5 as well. He endorsed lightheadedness and mild dyspnea in addition to continuous red blood from the urethral meatus with clots, but otherwise no other symptoms. He was admitted, given 1u platelets but continued to have bleeding. Subsequent hemoglobin dropped further to 6.7, so 2u PRBCs are ordered. Fortunately after completion of platelet transfusion, platelets are up to 53 and this morning he has not had any further bleeding.   Assessment & Plan: Principal Problem:   Gross hematuria Active Problems:   Prostate cancer ([place])   Pancytopenia ([place])   Grover's disease   Dyspnea  Gross hematuria: Due to traumatic foley insertion thought to be required due to urethral stricture, precipitated by recurrent thrombocytopenia.  - Resolved as of this AM s/p 1u platelets - No interventions per urology, continue foley.  - Follow up with urology as outpatient  Pancytopenia: Bone marrow biopsy result is pending.  - 2u PRBCs to be given today and observation overnight. If blood counts stable, anticipate could be discharged.  - Follow up later this week for repeat labs.  - Has scheduled follow up 7/23 with Dr. Irene Limbo.   Klebsiella and proteus UTI: Dx on urine Cx 6/25, initially on CTX, but not switched to cipro until 6/27.  - Continue ciprofloxacin for the  rest of today to complete 7 days therapy.  Grover's disease: No current flare. Formerly on MTX, stopped 2 months ago. - Kenalog cream prn  DVT prophylaxis: SCDs Code Status: Full Family Communication: Daughter and wife at bedside Disposition Plan: Transfusion today, monitor blood counts and bleeding overnight. DC home in AM if stable.  Consultants:   Discussed with Dr. Jana Hakim at admission  Procedures:   Echocardiogram 7/3 Read pending  Antimicrobials:  None   Subjective: Had continued bleeding overnight. Reported left chest pain worse when laying on his back not associated with dyspnea, better when laying on the left side.   Objective: BP 121/68 (BP Location: Left Arm)   Pulse 81   Temp 98.6 F (37 C) (Oral)   Resp 18   SpO2 100%   General exam: 83 y.o. male in no distress Respiratory system: Non-labored breathing room air. Clear to auscultation bilaterally.  Cardiovascular system: Regular rate and rhythm. No murmur, rub, or gallop. No JVD, and no pedal edema. Mild TTP on left sternal border. Gastrointestinal system: Abdomen soft, non-tender, non-distended, with normoactive bowel sounds. No organomegaly or masses felt. GU: Red blood with clots around foley within the urethral meatus in addition to in foley. When cleared from foley, urine is clear yellow without clots/redness.  Central nervous system: Alert and oriented. No focal neurological deficits. Extremities: Warm, no deformities Skin: No rashes, lesions no ulcers. Psychiatry: Judgement and insight appear normal. Mood & affect appropriate.   Data Reviewed: I have personally reviewed following labs and imaging studies  CBC:  Recent Labs Lab 04/21/17 0537 04/22/17 1314 04/26/17 1351 04/26/17 1405 04/27/17  0855  WBC 2.5* 2.2* 2.4*  --  1.9*  NEUTROABS 1.1*  --  1.3*  --  1.0*  HGB 8.8* 9.5* 8.6* 9.2* 6.7*  HCT 26.9* 29.0* 26.6* 27.0* 20.0*  MCV 84.1 82.6 83.9  --  82.6  PLT 52* 55* 36*  --  53*   Basic  Metabolic Panel:  Recent Labs Lab 04/21/17 0537 04/26/17 1405 04/26/17 1825  NA 139 140  --   K 4.1 3.8  --   CL 103 101  --   CO2 29  --   --   GLUCOSE 104* 100*  --   BUN 18 17  --   CREATININE 1.08 1.00  --   CALCIUM 8.7*  --   --   MG  --   --  2.1  PHOS  --   --  4.3   GFR: Estimated Creatinine Clearance: 62.3 mL/min (by C-G formula based on SCr of 1 mg/dL). Liver Function Tests: No results for input(s): AST, ALT, ALKPHOS, BILITOT, PROT, ALBUMIN in the last 168 hours. No results for input(s): LIPASE, AMYLASE in the last 168 hours. No results for input(s): AMMONIA in the last 168 hours. Coagulation Profile:  Recent Labs Lab 04/26/17 1825  INR 1.15   Cardiac Enzymes: No results for input(s): CKTOTAL, CKMB, CKMBINDEX, TROPONINI in the last 168 hours. BNP (last 3 results) No results for input(s): PROBNP in the last 8760 hours. HbA1C: No results for input(s): HGBA1C in the last 72 hours. CBG: No results for input(s): GLUCAP in the last 168 hours. Lipid Profile: No results for input(s): CHOL, HDL, LDLCALC, TRIG, CHOLHDL, LDLDIRECT in the last 72 hours. Thyroid Function Tests: No results for input(s): TSH, T4TOTAL, FREET4, T3FREE, THYROIDAB in the last 72 hours. Anemia Panel: No results for input(s): VITAMINB12, FOLATE, FERRITIN, TIBC, IRON, RETICCTPCT in the last 72 hours. Urine analysis:    Component Value Date/Time   COLORURINE YELLOW 04/19/2017 2219   APPEARANCEUR CLOUDY (A) 04/19/2017 2219   LABSPEC 1.013 04/19/2017 2219   PHURINE 7.0 04/19/2017 2219   GLUCOSEU NEGATIVE 04/19/2017 2219   HGBUR LARGE (A) 04/19/2017 2219   BILIRUBINUR NEGATIVE 04/19/2017 2219   KETONESUR NEGATIVE 04/19/2017 2219   PROTEINUR 100 (A) 04/19/2017 2219   UROBILINOGEN 0.2 03/29/2015 0611   NITRITE POSITIVE (A) 04/19/2017 2219   LEUKOCYTESUR LARGE (A) 04/19/2017 2219   Recent Results (from the past 240 hour(s))  TECHNOLOGIST REVIEW     Status: None   Collection Time: 04/19/17  12:54 PM  Result Value Ref Range Status   Technologist Review   Final    mod poly and ovalos, few teardrops, schistocytes, acanthos and helmet cells   Culture, Urine     Status: Abnormal   Collection Time: 04/19/17 10:19 PM  Result Value Ref Range Status   Specimen Description URINE, CATHETERIZED  Final   Special Requests NONE  Final   Culture (A)  Final    >=100,000 COLONIES/mL KLEBSIELLA OXYTOCA >=100,000 COLONIES/mL PSEUDOMONAS AERUGINOSA    Report Status 04/23/2017 FINAL  Final   Organism ID, Bacteria KLEBSIELLA OXYTOCA (A)  Final   Organism ID, Bacteria PSEUDOMONAS AERUGINOSA (A)  Final      Susceptibility   Klebsiella oxytoca - MIC*    AMPICILLIN >=32 RESISTANT Resistant     CEFAZOLIN >=64 RESISTANT Resistant     CEFTRIAXONE <=1 SENSITIVE Sensitive     CIPROFLOXACIN <=0.25 SENSITIVE Sensitive     GENTAMICIN <=1 SENSITIVE Sensitive     IMIPENEM <=0.25 SENSITIVE Sensitive  NITROFURANTOIN <=16 SENSITIVE Sensitive     TRIMETH/SULFA <=20 SENSITIVE Sensitive     AMPICILLIN/SULBACTAM 8 SENSITIVE Sensitive     PIP/TAZO <=4 SENSITIVE Sensitive     Extended ESBL NEGATIVE Sensitive     * >=100,000 COLONIES/mL KLEBSIELLA OXYTOCA   Pseudomonas aeruginosa - MIC*    CEFTAZIDIME <=1 SENSITIVE Sensitive     CIPROFLOXACIN <=0.25 SENSITIVE Sensitive     GENTAMICIN <=1 SENSITIVE Sensitive     IMIPENEM 1 SENSITIVE Sensitive     PIP/TAZO <=4 SENSITIVE Sensitive     CEFEPIME <=1 SENSITIVE Sensitive     * >=100,000 COLONIES/mL PSEUDOMONAS AERUGINOSA      Radiology Studies: No results found.  Scheduled Meds: . ciprofloxacin  500 mg Oral BID  . gabapentin  200 mg Oral TID  . nortriptyline  25 mg Oral QHS  . triamcinolone cream  1 application Topical q morning - 10a   Continuous Infusions: . sodium chloride 1,000 mL (04/26/17 1842)  . sodium chloride       LOS: 1 day   Time spent: 25 minutes.  Vance Gather, MD [facility] Pager [phone]  If 7PM-7AM, please contact  night-coverage [email or web address] Password [email or web address] 04/27/2017, 1:43 PM

## 2017-04-27 NOTE — Progress Notes (Signed)
Nursing Note: Pt c/o chest pain.A: Requested NT get am EKG.Percocet given for pain.Paged on-call w/ result of EKG,sinus rhythm w/ 1st degree AV block.Paged rapid response nurse and she applied Oxygen 2L n/c.wbb

## 2017-04-27 NOTE — Progress Notes (Signed)
CRITICAL VALUE ALERT  Critical Value:  Hgb 6.7 Date & Time Notied:  04/27/2017 0935  Provider Notified: Dr. Bonner Puna    Orders Received/Actions taken: Orders recieved

## 2017-04-27 NOTE — Progress Notes (Signed)
Nursing Note: Pt has foley in place,secure device applied,pt has Blood on pad under him,large to moderate amount and has large blood clots on his leg and at head of penis.Will clean once platelets are infusing.Paged on-call about pt's ativan that he takes at bedtime.Pt upset and requesting med.wbb

## 2017-04-27 NOTE — Progress Notes (Signed)
Nursing Note: Pt called for nurse and says that his penis feels like it is about to explode.A: Bladder scan for 0.Paged on-call.Obtained order to irrigate foley once.Order for lidocaine creme also.A: Irrigate foley w/ sterile technique.Pt immediately felt better.0420 applied lidocaine ointment per orders.wbb

## 2017-04-28 DIAGNOSIS — D62 Acute posthemorrhagic anemia: Secondary | ICD-10-CM

## 2017-04-28 LAB — BPAM RBC
BLOOD PRODUCT EXPIRATION DATE: 201807172359
BLOOD PRODUCT EXPIRATION DATE: 201807172359
ISSUE DATE / TIME: 201807031752
ISSUE DATE / TIME: 201807031528
UNIT TYPE AND RH: 6200
UNIT TYPE AND RH: 6200

## 2017-04-28 LAB — TYPE AND SCREEN
ABO/RH(D): A POS
ANTIBODY SCREEN: NEGATIVE
UNIT DIVISION: 0
Unit division: 0

## 2017-04-28 LAB — CBC
HCT: 24.3 % — ABNORMAL LOW (ref 39.0–52.0)
Hemoglobin: 8.3 g/dL — ABNORMAL LOW (ref 13.0–17.0)
MCH: 29 pg (ref 26.0–34.0)
MCHC: 34.2 g/dL (ref 30.0–36.0)
MCV: 85 fL (ref 78.0–100.0)
PLATELETS: 41 10*3/uL — AB (ref 150–400)
RBC: 2.86 MIL/uL — AB (ref 4.22–5.81)
RDW: 18.4 % — AB (ref 11.5–15.5)
WBC: 1.9 10*3/uL — AB (ref 4.0–10.5)

## 2017-04-28 MED ORDER — GABAPENTIN 100 MG PO CAPS
200.0000 mg | ORAL_CAPSULE | ORAL | Status: DC
  Administered 2017-04-28 – 2017-05-05 (×21): 200 mg via ORAL
  Filled 2017-04-28 (×21): qty 2

## 2017-04-28 NOTE — Progress Notes (Addendum)
Patient ID: Jordan Terry, male   DOB: August 16, 1932, 83 y.o.   MRN: [id number]  PROGRESS NOTE    Jordan Terry  [id number]:[id number] DOB: 02/07/1932 DOA: 04/26/2017  PCP: [name], MD   Brief Narrative:  83 y.o. 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 He had a similar episode in the setting of thrombocytopenia a week prior requiring platelet transfusion and platelets again were found to be down to 36 on arrival. Hemoglobin had dropped to 8.6 from 9.5 as well.  Patient was given 1 unit of platelets and 2 U PRBC so far during this hospital stay.   Assessment & Plan:   Principal Problem: Gross hematuria / Acute blood loss anemia - Thought to be from traumatic foley insertion due to ureteral stricture precipitated by thrombocytopenia - S/P 1 U platelets- S/P 2 U PRBC transfusion - No intervention per GU  Active Problems: Thyroid lobe nodule - See in CT scan 6/25, 2.6 cm, left lobe - Outpt follow up  Pancytopenia ([place]) - Due to history of prostate cancer, ? Grover disease - CT biopsy bone marrow done 6/26 - results pending  - Follows with Dr. Irene Limbo  - Follow up CBC in am  Klebsiella and pseudomonas UTI - Has completed 7 days of cipro - Urine cx is from 04/19/17  Depression - Continue Pamelor, ativan   DVT prophylaxis: SCD's Code Status: full code  Family Communication: no family at the bedside this am Disposition Plan: home in am if CBC stable    Consultants:   None   Procedures:   ECHO 7/3 - EF 50%, moderate LVH, grade 1 DD  Antimicrobials:   None    Subjective: No overnight events.  Objective: Vitals:   04/27/17 1900 04/27/17 2022 04/27/17 2042 04/28/17 0447  BP:  133/82 (!) 105/55 116/64  Pulse:  86 64 76  Resp:  18 16 18   Temp:   99.4 F (37.4 C) 98.8 F (37.1 C) 98.4 F (36.9 C)  TempSrc:  Oral Oral Oral  SpO2:  99% 100% 98%  Weight: 77.1 kg (170 lb)     Height: 6\' 2"  (1.88 m)       Intake/Output Summary (Last 24 hours) at 04/28/17 0939 Last data filed at 04/28/17 0858  Gross per 24 hour  Intake             1625 ml  Output             4250 ml  Net            -2625 ml   Filed Weights   04/27/17 1900  Weight: 77.1 kg (170 lb)    Examination:  General exam: Appears calm and comfortable  Respiratory system: Clear to auscultation. Respiratory effort normal. Cardiovascular system: S1 & S2 heard, RRR.  Gastrointestinal system: Abdomen is nondistended, soft and nontender. No organomegaly or masses felt. Normal bowel sounds heard. Central nervous system: Alert and oriented. No focal neurological deficits. Extremities: Symmetric 5 x 5 power. Skin: No rashes, lesions or ulcers Psychiatry: Judgement and insight appear normal. Mood & affect appropriate.   Data Reviewed: I have personally reviewed following labs and imaging studies  CBC:  Recent Labs Lab 04/22/17 1314 04/26/17 1351 04/26/17 1405 04/27/17 0855 04/27/17 2305 04/28/17 0305  WBC 2.2* 2.4*  --  1.9* 2.2* 1.9*  NEUTROABS  --  1.3*  --  1.0*  --   --   HGB 9.5* 8.6* 9.2* 6.7* 8.5* 8.3*  HCT 29.0* 26.6* 27.0* 20.0* 24.9* 24.3*  MCV 82.6 83.9  --  82.6 83.3 85.0  PLT 55* 36*  --  53* 43* 41*   Basic Metabolic Panel:  Recent Labs Lab 04/26/17 1405 04/26/17 1825  NA 140  --   K 3.8  --   CL 101  --   GLUCOSE 100*  --   BUN 17  --   CREATININE 1.00  --   MG  --  2.1  PHOS  --  4.3   GFR: Estimated Creatinine Clearance: 62.1 mL/min (by C-G formula based on SCr of 1 mg/dL). Liver Function Tests: No results for input(s): AST, ALT, ALKPHOS, BILITOT, PROT, ALBUMIN in the last 168 hours. No results for input(s): LIPASE, AMYLASE in the last 168 hours. No results for input(s): AMMONIA in the last 168 hours. Coagulation  Profile:  Recent Labs Lab 04/26/17 1825  INR 1.15   Cardiac Enzymes: No results for input(s): CKTOTAL, CKMB, CKMBINDEX, TROPONINI in the last 168 hours. BNP (last 3 results) No results for input(s): PROBNP in the last 8760 hours. HbA1C: No results for input(s): HGBA1C in the last 72 hours. CBG: No results for input(s): GLUCAP in the last 168 hours. Lipid Profile: No results for input(s): CHOL, HDL, LDLCALC, TRIG, CHOLHDL, LDLDIRECT in the last 72 hours. Thyroid Function Tests: No results for input(s): TSH, T4TOTAL, FREET4, T3FREE, THYROIDAB in the last 72 hours. Anemia Panel: No results for input(s): VITAMINB12, FOLATE, FERRITIN, TIBC, IRON, RETICCTPCT in the last 72 hours. Urine analysis:    Component Value Date/Time   COLORURINE YELLOW 04/19/2017 2219   APPEARANCEUR CLOUDY (A) 04/19/2017 2219   LABSPEC 1.013 04/19/2017 2219   PHURINE 7.0 04/19/2017 2219   GLUCOSEU NEGATIVE 04/19/2017 2219   HGBUR LARGE (A) 04/19/2017 2219   BILIRUBINUR NEGATIVE 04/19/2017 2219   KETONESUR NEGATIVE 04/19/2017 2219   PROTEINUR 100 (A) 04/19/2017 2219   UROBILINOGEN 0.2 03/29/2015 0611   NITRITE POSITIVE (A) 04/19/2017 2219   LEUKOCYTESUR LARGE (A) 04/19/2017 2219   Sepsis Labs: @LABRCNTIP (procalcitonin:4,lacticidven:4)   ) Recent Results (from the past 240 hour(s))  TECHNOLOGIST REVIEW     Status: None   Collection Time: 04/19/17 12:54 PM  Result Value Ref Range Status   Technologist Review   Final    mod poly and ovalos, few teardrops, schistocytes, acanthos and helmet cells   Culture, Urine     Status: Abnormal   Collection Time: 04/19/17 10:19 PM  Result Value Ref Range Status   Specimen Description URINE, CATHETERIZED  Final   Special Requests NONE  Final   Culture (A)  Final    >=100,000 COLONIES/mL KLEBSIELLA OXYTOCA >=100,000 COLONIES/mL PSEUDOMONAS AERUGINOSA    Report Status 04/23/2017 FINAL  Final   Organism ID, Bacteria KLEBSIELLA OXYTOCA (A)  Final   Organism ID,  Bacteria PSEUDOMONAS AERUGINOSA (A)  Final      Susceptibility   Klebsiella oxytoca - MIC*    AMPICILLIN >=32 RESISTANT Resistant     CEFAZOLIN >=64 RESISTANT Resistant     CEFTRIAXONE <=1 SENSITIVE Sensitive     CIPROFLOXACIN <=0.25 SENSITIVE Sensitive     GENTAMICIN <=1 SENSITIVE Sensitive     IMIPENEM <=0.25 SENSITIVE Sensitive     NITROFURANTOIN <=16 SENSITIVE Sensitive     TRIMETH/SULFA <=20 SENSITIVE Sensitive     AMPICILLIN/SULBACTAM 8 SENSITIVE Sensitive  PIP/TAZO <=4 SENSITIVE Sensitive     Extended ESBL NEGATIVE Sensitive     * >=100,000 COLONIES/mL KLEBSIELLA OXYTOCA   Pseudomonas aeruginosa - MIC*    CEFTAZIDIME <=1 SENSITIVE Sensitive     CIPROFLOXACIN <=0.25 SENSITIVE Sensitive     GENTAMICIN <=1 SENSITIVE Sensitive     IMIPENEM 1 SENSITIVE Sensitive     PIP/TAZO <=4 SENSITIVE Sensitive     CEFEPIME <=1 SENSITIVE Sensitive     * >=100,000 COLONIES/mL PSEUDOMONAS AERUGINOSA      Radiology Studies: No results found.      Scheduled Meds: . gabapentin  200 mg Oral TID  . LORazepam  1 mg Oral QHS  . nortriptyline  25 mg Oral QHS  . triamcinolone cream  1 application Topical q morning - 10a   Continuous Infusions:   LOS: 2 days    Time spent: 25 minutes Greater than 50% of the time spent on counseling and coordinating the care.   Leisa Lenz, MD [facility] Pager [phone]  If 7PM-7AM, please contact night-coverage [email or web address] Password [facility] 04/28/2017, 9:39 AM

## 2017-04-29 ENCOUNTER — Telehealth: Payer: Self-pay | Admitting: *Deleted

## 2017-04-29 ENCOUNTER — Encounter: Payer: Self-pay | Admitting: *Deleted

## 2017-04-29 DIAGNOSIS — C61 Malignant neoplasm of prostate: Secondary | ICD-10-CM

## 2017-04-29 LAB — CBC
HEMATOCRIT: 27.7 % — AB (ref 39.0–52.0)
HEMOGLOBIN: 9.2 g/dL — AB (ref 13.0–17.0)
MCH: 28 pg (ref 26.0–34.0)
MCHC: 33.2 g/dL (ref 30.0–36.0)
MCV: 84.2 fL (ref 78.0–100.0)
Platelets: 43 10*3/uL — ABNORMAL LOW (ref 150–400)
RBC: 3.29 MIL/uL — ABNORMAL LOW (ref 4.22–5.81)
RDW: 18.6 % — AB (ref 11.5–15.5)
WBC: 2.6 10*3/uL — ABNORMAL LOW (ref 4.0–10.5)

## 2017-04-29 MED ORDER — FENTANYL CITRATE (PF) 100 MCG/2ML IJ SOLN
25.0000 ug | INTRAMUSCULAR | Status: DC | PRN
  Administered 2017-04-29: 25 ug via INTRAVENOUS

## 2017-04-29 MED ORDER — FENTANYL CITRATE (PF) 100 MCG/2ML IJ SOLN
INTRAMUSCULAR | Status: AC
  Filled 2017-04-29: qty 2

## 2017-04-29 NOTE — Progress Notes (Addendum)
Patient ID: Jordan Terry, male   DOB: [birth year], 83 y.o.   MRN: [id number]  PROGRESS NOTE    Jordan Terry  QIW:[id number] DOB: 01/12/1932 DOA: 04/26/2017  PCP: Jordan Battles, MD   Brief Narrative:  83 y.o. 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 He had a similar episode in the setting of thrombocytopenia a week prior requiring platelet transfusion and platelets again were found to be down to 36 on arrival. Hemoglobin had dropped to 8.6 from 9.5 as well.  Patient was given 1 unit of platelets and 2 U PRBC so far during this hospital stay.   Assessment & Plan:   Principal Problem: Gross hematuria / Acute blood loss anemia - Thought to be from traumatic foley insertion due to ureteral stricture precipitated by thrombocytopenia - Has received 1 U platelets during this hospital stay and 2 U PRBC transfusion  - No intervention required per GU - Continues to have blood clots and hematuria this am - Will continue to monitor  - Hgb and platelets have been stable in past 24 hours   Active Problems: Thyroid lobe nodule - Follow up outpt  Pancytopenia ([facility]) - Due to history of prostate cancer, ? Grover disease - CT bone marrow bx done 6/26 - final bx result pending as of this am - Continue to follow with Jordan Terry - Daily CBC  History of prostate cancer - Follows with oncology   Klebsiella and pseudomonas UTI - Completed cipro for 7 days  Depression - Continue Pamelor and ativan   DVT prophylaxis: SCD's Code Status: full code  Family Communication: family not at the bedside this am Disposition Plan: home in am if hematuria better    Consultants:   GU   Procedures:   ECHO 7/3 - EF 50%, moderate LVH, grade 1 DD  Antimicrobials:   Cipro     Subjective: Hematuria again this am.  Objective: Vitals:   04/28/17 0447 04/28/17 1359 04/28/17 2051 04/29/17 0507  BP: 116/64 113/67 (!) 160/88 109/69  Pulse: 76 94 82 85  Resp: 18 18 18 18   Temp: 98.4 F (36.9 C) 98.5 F (36.9 C) 98.7 F (37.1 C) 98 F (36.7 C)  TempSrc: Oral Oral Oral Oral  SpO2: 98% 97% 100% 99%  Weight:      Height:        Intake/Output Summary (Last 24 hours) at 04/29/17 1121 Last data filed at 04/29/17 0836  Gross per 24 hour  Intake             1080 ml  Output             3775 ml  Net            -2695 ml   Filed Weights   04/27/17 1900  Weight: 77.1 kg (170 lb)    Examination:  Physical Exam  Constitutional: Appears well-developed and well-nourished. No distress.  Neck: Normal ROM. Neck supple.  CVS: RRR, S1/S2 +, no murmurs, no gallops, no carotid bruit.  Pulmonary: Effort and breath sounds normal, no stridor, rhonchi, wheezes, rales.  Abdominal: Soft. BS +,  no distension, tenderness, rebound or guarding.  Musculoskeletal: Normal range of motion. No edema and no tenderness.  Lymphadenopathy: No lymphadenopathy noted, cervical, inguinal. Neuro: Alert. Normal reflexes, muscle tone coordination. No cranial nerve  deficit. Skin: Skin is warm and dry.  Psychiatric: Normal mood and affect. Behavior, judgment, thought content normal.     Data Reviewed: I have personally reviewed following labs and imaging studies  CBC:  Recent Labs Lab 04/26/17 1351 04/26/17 1405 04/27/17 0855 04/27/17 2305 04/28/17 0305 04/29/17 0309  WBC 2.4*  --  1.9* 2.2* 1.9* 2.6*  NEUTROABS 1.3*  --  1.0*  --   --   --   HGB 8.6* 9.2* 6.7* 8.5* 8.3* 9.2*  HCT 26.6* 27.0* 20.0* 24.9* 24.3* 27.7*  MCV 83.9  --  82.6 83.3 85.0 84.2  PLT 36*  --  53* 43* 41* 43*   Basic Metabolic Panel:  Recent Labs Lab 04/26/17 1405 04/26/17 1825  NA 140  --   K 3.8  --   CL 101  --   GLUCOSE 100*  --   BUN 17  --   CREATININE 1.00  --   MG  --  2.1  PHOS  --   4.3   GFR: Estimated Creatinine Clearance: 62.1 mL/min (by C-G formula based on SCr of 1 mg/dL). Liver Function Tests: No results for input(s): AST, ALT, ALKPHOS, BILITOT, PROT, ALBUMIN in the last 168 hours. No results for input(s): LIPASE, AMYLASE in the last 168 hours. No results for input(s): AMMONIA in the last 168 hours. Coagulation Profile:  Recent Labs Lab 04/26/17 1825  INR 1.15   Cardiac Enzymes: No results for input(s): CKTOTAL, CKMB, CKMBINDEX, TROPONINI in the last 168 hours. BNP (last 3 results) No results for input(s): PROBNP in the last 8760 hours. HbA1C: No results for input(s): HGBA1C in the last 72 hours. CBG: No results for input(s): GLUCAP in the last 168 hours. Lipid Profile: No results for input(s): CHOL, HDL, LDLCALC, TRIG, CHOLHDL, LDLDIRECT in the last 72 hours. Thyroid Function Tests: No results for input(s): TSH, T4TOTAL, FREET4, T3FREE, THYROIDAB in the last 72 hours. Anemia Panel: No results for input(s): VITAMINB12, FOLATE, FERRITIN, TIBC, IRON, RETICCTPCT in the last 72 hours. Urine analysis:    Component Value Date/Time   COLORURINE YELLOW 04/19/2017 2219   APPEARANCEUR CLOUDY (A) 04/19/2017 2219   LABSPEC 1.013 04/19/2017 2219   PHURINE 7.0 04/19/2017 2219   GLUCOSEU NEGATIVE 04/19/2017 2219   HGBUR LARGE (A) 04/19/2017 2219   BILIRUBINUR NEGATIVE 04/19/2017 2219   [place] 04/19/2017 2219   PROTEINUR 100 (A) 04/19/2017 2219   UROBILINOGEN 0.2 03/29/2015 0611   NITRITE POSITIVE (A) 04/19/2017 2219   LEUKOCYTESUR LARGE (A) 04/19/2017 2219   Sepsis Labs: @LABRCNTIP (procalcitonin:4,lacticidven:4)   ) Recent Results (from the past 240 hour(s))  TECHNOLOGIST REVIEW     Status: None   Collection Time: 04/19/17 12:54 PM  Result Value Ref Range Status   Technologist Review   Final    mod poly and ovalos, few teardrops, schistocytes, acanthos and helmet cells   Culture, Urine     Status: Abnormal   Collection Time: 04/19/17  10:19 PM  Result Value Ref Range Status   Specimen Description URINE, CATHETERIZED  Final   Special Requests NONE  Final   Culture (A)  Final    >=100,000 COLONIES/mL KLEBSIELLA OXYTOCA >=100,000 COLONIES/mL PSEUDOMONAS AERUGINOSA    Report Status 04/23/2017 FINAL  Final   Organism ID, Bacteria KLEBSIELLA OXYTOCA (A)  Final   Organism ID, Bacteria PSEUDOMONAS AERUGINOSA (A)  Final      Susceptibility   Klebsiella oxytoca - MIC*    AMPICILLIN >=32 RESISTANT Resistant     CEFAZOLIN >=64 RESISTANT  Resistant     CEFTRIAXONE <=1 SENSITIVE Sensitive     CIPROFLOXACIN <=0.25 SENSITIVE Sensitive     GENTAMICIN <=1 SENSITIVE Sensitive     IMIPENEM <=0.25 SENSITIVE Sensitive     NITROFURANTOIN <=16 SENSITIVE Sensitive     TRIMETH/SULFA <=20 SENSITIVE Sensitive     AMPICILLIN/SULBACTAM 8 SENSITIVE Sensitive     PIP/TAZO <=4 SENSITIVE Sensitive     Extended ESBL NEGATIVE Sensitive     * >=100,000 COLONIES/mL KLEBSIELLA OXYTOCA   Pseudomonas aeruginosa - MIC*    CEFTAZIDIME <=1 SENSITIVE Sensitive     CIPROFLOXACIN <=0.25 SENSITIVE Sensitive     GENTAMICIN <=1 SENSITIVE Sensitive     IMIPENEM 1 SENSITIVE Sensitive     PIP/TAZO <=4 SENSITIVE Sensitive     CEFEPIME <=1 SENSITIVE Sensitive     * >=100,000 COLONIES/mL PSEUDOMONAS AERUGINOSA      Radiology Studies: No results found.      Scheduled Meds: . gabapentin  200 mg Oral 3 times per day  . LORazepam  1 mg Oral QHS  . nortriptyline  25 mg Oral QHS  . triamcinolone cream  1 application Topical q morning - 10a   Continuous Infusions:   LOS: 3 days    Time spent: 25 minutes Greater than 50% of the time spent on counseling and coordinating the care.   Leisa Lenz, MD [facility] Pager [phone]  If 7PM-7AM, please contact night-coverage [email or web address] Password [email or web address] 04/29/2017, 11:21 AM

## 2017-04-29 NOTE — Progress Notes (Signed)
Pt called for foley irrigation; then moments later screamed; admin Percocet. Pt requesting IV pain medication ("Fentanyl")

## 2017-04-29 NOTE — Progress Notes (Signed)
Patient screaming out in pain at the penis head. Large clot noted along with bleeding bright red blood. Lidocaine cream applied to penis head. Md notified and made aware that patient is c/o severe pain and requesting that MD order IV pain medication. Dr. Charlies Silvers made aware of patient passing large clots with c/o severe pain. New orders taken for fentanyl IV. Foley catheter irrigated with removal of several clots. Pt reports almost instant relief after irrigation. Will continue to monitor patient.

## 2017-04-29 NOTE — Telephone Encounter (Signed)
Family member calling to say patient is inpt at [facility] long and may not be at appt 04/30/17 with dr Lebron Conners. Note to desk nurse

## 2017-04-30 ENCOUNTER — Other Ambulatory Visit: Payer: Medicare Other

## 2017-04-30 ENCOUNTER — Inpatient Hospital Stay: Payer: Medicare Other | Admitting: Hematology and Oncology

## 2017-04-30 LAB — CBC
HCT: 25 % — ABNORMAL LOW (ref 39.0–52.0)
Hemoglobin: 8.4 g/dL — ABNORMAL LOW (ref 13.0–17.0)
MCH: 28.6 pg (ref 26.0–34.0)
MCHC: 33.6 g/dL (ref 30.0–36.0)
MCV: 85 fL (ref 78.0–100.0)
PLATELETS: 37 10*3/uL — AB (ref 150–400)
RBC: 2.94 MIL/uL — AB (ref 4.22–5.81)
RDW: 18.9 % — AB (ref 11.5–15.5)
WBC: 2.5 10*3/uL — ABNORMAL LOW (ref 4.0–10.5)

## 2017-04-30 LAB — BASIC METABOLIC PANEL
Anion gap: 4 — ABNORMAL LOW (ref 5–15)
BUN: 20 mg/dL (ref 6–20)
CALCIUM: 8.5 mg/dL — AB (ref 8.9–10.3)
CO2: 31 mmol/L (ref 22–32)
CREATININE: 1.03 mg/dL (ref 0.61–1.24)
Chloride: 103 mmol/L (ref 101–111)
GFR calc Af Amer: >60 mL/min (ref >60)
GLUCOSE: 115 mg/dL — AB (ref 65–99)
Potassium: 4 mmol/L (ref 3.5–5.1)
SODIUM: 138 mmol/L (ref 135–145)

## 2017-04-30 MED ORDER — SODIUM CHLORIDE 0.9 % IV SOLN
Freq: Once | INTRAVENOUS | Status: AC
  Administered 2017-04-30: 12:00:00 via INTRAVENOUS

## 2017-04-30 NOTE — Care Management Note (Signed)
Case Management Note  Patient Details  Name: Jordan Terry MRN: [id number] Date of Birth: 02-Feb-1932  Subjective/Objective:       83 yo admitted with gross hematuria. Hx of prostate cancer.             Action/Plan: From home with spouse and independent with ADLs prior to admission. CM will follow for DC needs.  Expected Discharge Date:   (unknown)               Expected Discharge Plan:  Home/Self Care  In-House Referral:     Discharge planning Services  CM Consult  Post Acute Care Choice:    Choice offered to:     DME Arranged:    DME Agency:     HH Arranged:    HH Agency:     Status of Service:  In process, will continue to follow  If discussed at Long Length of Stay Meetings, dates discussed:    Additional CommentsLynnell Catalan, RN 04/30/2017, 10:38 AM  [phone]

## 2017-04-30 NOTE — Progress Notes (Signed)
Patient ID: Jordan Terry, male   DOB: Nov 06, 1931, 83 y.o.   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 He had a similar episode in the setting of thrombocytopenia a week prior requiring platelet transfusion and platelets again were found to be down to 36 on arrival. Hemoglobin had dropped to 8.6 from 9.5 as well.  Patient was given 1 unit of platelets and 2 U PRBC so far during this hospital stay.    Assessment & Plan:   Principal Problem: Gross hematuria / Acute blood loss anemia - Thought to be from traumatic Foley insertion due to urethral stricture precipitated by thrombocytopenia - Still has ongoing hematuria and passing blood clots so we will consult GU again this morning - Patient has received 1 unit of platelets and 2 units of PRBC so far during this hospital stay - We'll give 1 unit of platelets this morning since platelet count dropped to 37 - Hemoglobin is stable  Active Problems: Thyroid lobe nodule - Patient will follow up outpatient  Pancytopenia ([place]) - Likely due to history of prostate cancer - Patient had CT bone marrow biopsy done 04/20/2017 but final biopsy results are still pending - Follow up daily CBC  History of prostate cancer - Follow with onc   Klebsiella and pseudomonas UTI - Completed cipro for 7 days   Depression - Continue ativan and Pamelor    DVT prophylaxis: SCD's  Code Status: full code  Family Communication: spoke with pt, no family at the bedside  Disposition Plan: home once bleeding improves    Consultants:   GU  Procedures:   ECHO 7/3 - EF 50% and  grade 1 DD  Antimicrobials:   Cipro   Subjective: Feels better this am, still has hematuria.  Objective: Vitals:   04/29/17 0507 04/29/17 1433 04/29/17 2113 04/30/17 0606  BP: 109/69 (!) 119/56 103/74 108/62  Pulse: 85 86 88 79  Resp: '18 16 18 16  ' Temp: 98 F (36.7 C) 98 F (36.7 C) 97.8 F (36.6 C) 98 F (36.7 C)  TempSrc: Oral Oral Oral Oral  SpO2: 99% 98% 99% 99%  Weight:      Height:        Intake/Output Summary (Last 24 hours) at 04/30/17 0841 Last data filed at 04/30/17 0830  Gross per 24 hour  Intake             1300 ml  Output             2475 ml  Net            -1175 ml   Filed Weights   04/27/17 1900  Weight: 77.1 kg (170 lb)    Examination:  General exam: Appears calm and comfortable  Respiratory system: Clear to auscultation. Respiratory effort normal. Cardiovascular system: S1 & S2 heard, RRR.  Gastrointestinal system: Abdomen is nondistended, soft and nontender. No organomegaly or masses felt. Normal bowel sounds heard. Central nervous system: Alert and oriented. No focal neurological deficits. Extremities: Symmetric 5 x 5 power. Skin: No rashes, lesions or ulcers Psychiatry: Judgement and insight appear normal. Mood & affect appropriate.   Data Reviewed: I have  personally reviewed following labs and imaging studies  CBC:  Recent Labs Lab 04/26/17 1351  04/27/17 0855 04/27/17 2305 04/28/17 0305 04/29/17 0309 04/30/17 0337  WBC 2.4*  --  1.9* 2.2* 1.9* 2.6* 2.5*  NEUTROABS 1.3*  --  1.0*  --   --   --   --   HGB 8.6*  < > 6.7* 8.5* 8.3* 9.2* 8.4*  HCT 26.6*  < > 20.0* 24.9* 24.3* 27.7* 25.0*  MCV 83.9  --  82.6 83.3 85.0 84.2 85.0  PLT 36*  --  53* 43* 41* 43* 37*  < > = values in this interval not displayed. Basic Metabolic Panel:  Recent Labs Lab 04/26/17 1405 04/26/17 1825 04/30/17 0337  NA 140  --  138  K 3.8  --  4.0  CL 101  --  103  CO2  --   --  31  GLUCOSE 100*  --  115*  BUN 17  --  20  CREATININE 1.00  --  1.03    CALCIUM  --   --  8.5*  MG  --  2.1  --   PHOS  --  4.3  --    GFR: Estimated Creatinine Clearance: 60.3 mL/min (by C-G formula based on SCr of 1.03 mg/dL). Liver Function Tests: No results for input(s): AST, ALT, ALKPHOS, BILITOT, PROT, ALBUMIN in the last 168 hours. No results for input(s): LIPASE, AMYLASE in the last 168 hours. No results for input(s): AMMONIA in the last 168 hours. Coagulation Profile:  Recent Labs Lab 04/26/17 1825  INR 1.15   Cardiac Enzymes: No results for input(s): CKTOTAL, CKMB, CKMBINDEX, TROPONINI in the last 168 hours. BNP (last 3 results) No results for input(s): PROBNP in the last 8760 hours. HbA1C: No results for input(s): HGBA1C in the last 72 hours. CBG: No results for input(s): GLUCAP in the last 168 hours. Lipid Profile: No results for input(s): CHOL, HDL, LDLCALC, TRIG, CHOLHDL, LDLDIRECT in the last 72 hours. Thyroid Function Tests: No results for input(s): TSH, T4TOTAL, FREET4, T3FREE, THYROIDAB in the last 72 hours. Anemia Panel: No results for input(s): VITAMINB12, FOLATE, FERRITIN, TIBC, IRON, RETICCTPCT in the last 72 hours. Urine analysis:  Sepsis Labs: '@LABRCNTIP' (procalcitonin:4,lacticidven:4)   )No results found for this or any previous visit (from the past 240 hour(s)).    Radiology Studies: No results found.      Scheduled Meds: . gabapentin  200 mg Oral 3 times per day  . LORazepam  1 mg Oral QHS  . nortriptyline  25 mg Oral QHS  . triamcinolone cream  1 application Topical q morning - 10a   Continuous Infusions:   LOS: 4 days    Time spent: 25 minutes  Greater than 50% of the time spent on counseling and coordinating the care.   Jordan Lenz, MD [facility] Pager [phone]  If 7PM-7AM, please contact night-coverage [email or web address] Password [email or web address] 04/30/2017, 8:41 AM

## 2017-04-30 NOTE — Consult Note (Signed)
Urology Consult  Consulting MD: Jordan Terry   CC: blood and urine  HPI: This is a 83 year old male with  The following history:   1) Prostate cancer: He is s/p treatment with combination EBRT/palladium seed implantation in January 2003. He has had no evidence for cancer recurrence.   TNM stage: cT1c Nx Mx  Gleason score: 3+4=7  PSA nadir: 0.06 (Jul 2015)   2)  History of urethral stricture / LUTS: He developed urinary retention in August 2008 and did undergo catheter placement after balloon dilation of his urethral stricture by Dr. Matilde Terry in the emergency department. He subsequently passed a voiding trial. He developed worsening urinary urgency and frequency in January 2011 and was found to have a recurrent stricture and underwent repeat balloon dilation in May 2011. In May 2012, he developed nocturnal incontinence after shoulder surgery. Interestingly, he did not have any daytime symptoms. His symptoms ultimately resolved spontaneously. He has had intermittent recurrence of his urethral stricture requiring dilation and catheter placement. He has been managed with CIC to keep his stricture open.   Aug 2008: Dilation of urethral stricture  May 2011: Balloon dilation of urethral stricture  May 2013: Balloon dilation of urethral stricture  April 2014: Dilation of urethral stricture  June 2014: Balloon dilation of urethral stricture  Oct 2014: Balloon dilation of urethral stricture  Jul 2015: Balloon dilation of urethral stricture  May 2017: Balloon dilation of urethral stricture  3) gross hematuria: Recent history/admission for gross hematuria. He was discharged with fairly clear urine, seen earlier this week in our office with bleeding around the catheter.  The urine draining from the catheter was apparently fairly clear.  He was admitted recently for exacerbation of his hematuria.  Apparently, clots were irrigated recently.  Over the past 2 days, he has had clear urine.  A complicating  factor is that the patient does have significant thrombocytopenia and has been undergoing platelet transfusions.    PMH: Past Medical History:  Diagnosis Date  . Anginal pain ([place]) 20 years ago  . Anxiety   . Arthritis   . Bilateral tinnitus    wears hearing aides  . Cancer [facility])    prostate  . Grover's disease   . Headache(784.0)    not since retired  . Hematuria 04/14/2017  . Self-catheterizes urinary bladder 04/19/2017  . Vertigo     PSH: Past Surgical History:  Procedure Laterality Date  . CARDIAC CATHETERIZATION  "20 years ago"  . CARPAL TUNNEL RELEASE Left 2008  . CATARACT EXTRACTION Bilateral 2014  . CYSTOSCOPY WITH URETHRAL DILATATION N/A 08/14/2013   Procedure: CYSTOSCOPY WITH URETHRAL DILATATION BALLOON DILATION OF URETHRAL STRICTURE;  Surgeon: Jordan Gray, MD;  Location: [facility]S;  Service: Urology;  Laterality: N/A;  BALLOON DILATION   . CYSTOSCOPY WITH URETHRAL DILATATION N/A 05/07/2014   Procedure: CYSTOSCOPY WITH BALLOON DILATION OF URETHRAL STRICTURE;  Surgeon: Jordan Bring, MD;  Location: [facility];  Service: Urology;  Laterality: N/A;  [place]   "scope"  . NOSE SURGERY  1991, 1998  . RADIOACTIVE SEED IMPLANT  2002   "for prostate cancer"  . SHOULDER SURGERY  2012   right  . THYROIDECTOMY, PARTIAL  1974  . VASECTOMY  1970    Allergies: Allergies  Allergen Reactions  . Benadryl [Diphenhydramine Hcl] Anxiety    MAKES HIM GO CRAZY  . Hydrocodone Itching    Small amounts are okay  Medications: Prescriptions Prior to Admission  Medication Sig Dispense Refill Last Dose  . [EXPIRED] ciprofloxacin (CIPRO) 500 MG tablet Take 1 tablet (500 mg total) by mouth 2 (two) times daily. 14 tablet 0 04/25/2017 at Unknown time  . gabapentin (NEURONTIN) 100 MG capsule Take 200 mg by mouth 3 (three) times daily.    04/26/2017 at Unknown time  . LORazepam (ATIVAN) 0.5 MG tablet Take 1 mg by mouth at bedtime.    04/25/2017 at Unknown time  . nortriptyline (PAMELOR) 25 MG capsule Take 1 capsule (25 mg total) by mouth at bedtime. 90 capsule 1 04/25/2017 at Unknown time  . oxyCODONE-acetaminophen (PERCOCET) 7.5-325 MG per tablet Take 0.5-1 tablets by mouth every 8 (eight) hours as needed (pain.).    Past Week at Unknown time  . polyethylene glycol (MIRALAX / GLYCOLAX) packet Take 17 g by mouth daily. (Patient taking differently: Take 17 g by mouth daily as needed for mild constipation or moderate constipation. ) 14 each 0 Past Week at Unknown time  . temazepam (RESTORIL) 15 MG capsule Take 15 mg by mouth at bedtime.   04/25/2017 at Unknown time  . tetrahydrozoline-zinc (VISINE-AC) 0.05-0.25 % ophthalmic solution Place 1 drop into both eyes daily as needed (allergies).   Past Week at Unknown time  . traMADol (ULTRAM) 50 MG tablet Take 50 mg by mouth every 6 (six) hours as needed for moderate pain.    Past Month at Unknown time  . triamcinolone cream (KENALOG) 0.1 % Apply 1 application topically every morning. APPLY TO CHEST AND BACK EVERY MORNING FOR [place] DISEASE   04/26/2017 at Unknown time  . docusate sodium (COLACE) 100 MG capsule Take 1 capsule (100 mg total) by mouth daily. (Patient not taking: Reported on 04/26/2017) 10 capsule 0 Not Taking at Unknown time     Social History: Social History   Social History  . Marital status: Married    Spouse name: [name]  . Number of children: 3  . Years of education: college   Occupational History  . Not on file.   Social History Main Topics  . Smoking status: Former Smoker    Years: 10.00    Types: Cigars    Quit date: 1979  . Smokeless tobacco: Never Used  . Alcohol use No  . Drug use: No  . Sexual activity: Not on file   Other Topics Concern  . Not on file   Social History Narrative   Patient is married [name]) and lives at home with his wife.   Patient has three children.   Patient is retired.   Patient has a college education in [place].   Patient  is right-handed.   Patient drinks one cup of coffee daily and one 8 oz of tea daily.    Family History: Family History  Problem Relation Age of Onset  . Anxiety disorder Mother   . Cancer Father        colon, lung  . Diabetes Brother   . Cancer Brother   . Diabetes Sister   . Leukemia Sister   . Cancer Sister        breast  . Cancer Sister        breast  . Cancer Sister        thyroid  . Diabetes Sister     Review of Systems: Positive: gross hematuria, bladder spasms Negative: .  A further 10 point review of systems was negative except what is listed in the HPI.  Physical Exam: @  [id number]@ General: No acute distress.  Awake. Head:  Normocephalic.  Atraumatic. ENT:  EOMI.  Mucous membranes moist Neck:  Supple.  No lymphadenopathy. CV:  S1 present. S2 present. Regular rate. Pulmonary: Equal effort bilaterally.  Clear to auscultation bilaterally. Abdomen: Soft.  Non- tender to palpation. Skin:  Normal turgor.  No visible rash. Extremity: No gross deformity of bilateral upper extremities.  No gross deformity of bilateral lower extremities. Neurologic: Alert. Appropriate mood.   Urethra:  Foley catheter in place.  Orthotopic meatus. Scrotum: No lesions.  No ecchymosis.  No erythema. Testicles: Descended bilaterally.  No masses bilaterally. Epididymis: Palpable bilaterally.  Non Tender to palpation.  Studies:  Recent Labs     04/29/17  0309  04/30/17  0337  HGB  9.2*  8.4*  WBC  2.6*  2.5*  PLT  43*  37*    Recent Labs     04/30/17  0337  NA  138  K  4.0  CL  103  CO2  31  BUN  20  CREATININE  1.03  CALCIUM  8.5*  GFRNONAA  >60  GFRAA  >60     No results for input(s): INR, APTT in the last 72 hours.  Invalid input(s): PT   Invalid input(s): ABG    Assessment:  Gross hematuria, Most likely secondary to combination of radiation-induced hemorrhagic cystitis, urethral stricture as well as significant thrombocytopenia.  Currently, the urine is  clearing and certainly I don't think looking at the urine and this does not look significant.  Plan: 1.  I would continue platelet transfusions.  Unfortunately, with this patient this will be a long-term issue  2.  If the patient does have persistent bleeding over the weekend, I would consider cystoscopy and cauterization of any bleeders.  However, it is important to realize that a patient with normal platelets would more than likely not have an issue with this.  I worry that this may be a long-term issue with him, with regards to the bleeding.  3.  I'll follow carefully over the weekend and, if necessary, consider urgent anesthetic procedure    Pager:[phone]

## 2017-05-01 LAB — CBC
HCT: 23.3 % — ABNORMAL LOW (ref 39.0–52.0)
Hemoglobin: 7.9 g/dL — ABNORMAL LOW (ref 13.0–17.0)
MCH: 29 pg (ref 26.0–34.0)
MCHC: 33.9 g/dL (ref 30.0–36.0)
MCV: 85.7 fL (ref 78.0–100.0)
Platelets: 64 10*3/uL — ABNORMAL LOW (ref 150–400)
RBC: 2.72 MIL/uL — ABNORMAL LOW (ref 4.22–5.81)
RDW: 18.9 % — AB (ref 11.5–15.5)
WBC: 2.3 10*3/uL — ABNORMAL LOW (ref 4.0–10.5)

## 2017-05-01 LAB — BASIC METABOLIC PANEL
Anion gap: 6 (ref 5–15)
BUN: 18 mg/dL (ref 6–20)
CALCIUM: 8.5 mg/dL — AB (ref 8.9–10.3)
CO2: 31 mmol/L (ref 22–32)
CREATININE: 1.06 mg/dL (ref 0.61–1.24)
Chloride: 104 mmol/L (ref 101–111)
GFR calc non Af Amer: >60 mL/min (ref >60)
Glucose, Bld: 106 mg/dL — ABNORMAL HIGH (ref 65–99)
Potassium: 3.9 mmol/L (ref 3.5–5.1)
SODIUM: 141 mmol/L (ref 135–145)

## 2017-05-01 LAB — PREPARE PLATELET PHERESIS: Unit division: 0

## 2017-05-01 LAB — BPAM PLATELET PHERESIS
Blood Product Expiration Date: 201807082359
ISSUE DATE / TIME: 201807061206
UNIT TYPE AND RH: 6200

## 2017-05-01 LAB — PREPARE RBC (CROSSMATCH)

## 2017-05-01 MED ORDER — SODIUM CHLORIDE 0.9 % IV SOLN: Freq: Once | INTRAVENOUS | Status: DC

## 2017-05-01 NOTE — Progress Notes (Signed)
Patient ID: Jordan Terry, male   DOB: October 17, 1932, 83 y.o.   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 He had a similar episode in the setting of thrombocytopenia a week prior requiring platelet transfusion and platelets again were found to be down to 36 on arrival. Hemoglobin had dropped to 8.6 from 9.5 as well.  Patient was given 1 unit of platelets and 2 U PRBC so far during this hospital stay.    Assessment & Plan:   Principal Problem: Gross hematuria / Acute blood loss anemia - Thought to be from traumatic Foley insertion due to urethral stricture precipitated by thrombocytopenia - Seen by GU again - Most certainly there has been improvement since platelet transfusion - So far had gotten 1 U platelets and 2 U PRBC and then 1 more unit platelet 7/6 - Will give 1 U PRBC today - Follow up CBC In am   Active Problems: Thyroid lobe nodule - Outpt follow up  Pancytopenia ([place]) - Likely due to history of prostate cancer - CT bone marrow bx done 6/26 - no obvious B cell lymphoproliferative disorder despite monoclonal B-cell population ; possible MDS but non-clonal caused of dysplasia must be excluded such as drug/toxin exposure, GF therapy, viral infection, nutritional deficiency (B12, copper...)  History of prostate cancer - Pt will follow up with oncology   Klebsiella and pseudomonas UTI - Completed cipro for 7 days   Depression - Continue ativan and Pamelor    DVT prophylaxis: SCD's  Code Status: full code  Family Communication: no family this am at bedside Disposition Plan: home once platelets  and hgb stable    Consultants:   Urology, Dr. Franchot Gallo  Procedures:   ECHO 7/3 - EF 50% and grade 1 DD  Antimicrobials:   Cipro completed    Subjective: No overnight events.  Objective: Vitals:   04/30/17 1236 04/30/17 1430 04/30/17 2010 05/01/17 0610  BP: 139/73 110/63 104/66 128/76  Pulse: 96 86 82 96  Resp: 16 16 16 16   Temp: 98.7 F (37.1 C) 99.2 F (37.3 C) 98.3 F (36.8 C) 97.7 F (36.5 C)  TempSrc: Oral Oral Oral Oral  SpO2: 97% 99% 98% 98%  Weight:      Height:        Intake/Output Summary (Last 24 hours) at 05/01/17 1025 Last data filed at 05/01/17 0843  Gross per 24 hour  Intake             1147 ml  Output              400 ml  Net              747 ml   Filed Weights   04/27/17 1900  Weight: 77.1 kg (170 lb)    Examination:  Physical Exam  Constitutional: Appears in no distress.  CVS: RRR, S1/S2 + Pulmonary: Effort and breath sounds normal, no stridor, rhonchi, wheezes, rales.  Abdominal: Soft. BS +,  no distension, tenderness, rebound or guarding.  Musculoskeletal: Normal range of motion. No edema and no tenderness.  Neuro: Alert. Normal reflexes, muscle tone coordination. No cranial nerve deficit. Skin: Skin is  warm and dry.  Psychiatric: Normal mood and affect. Behavior, judgment, thought content normal.     Data Reviewed: I have personally reviewed following labs and imaging studies  CBC:  Recent Labs Lab 04/26/17 1351  04/27/17 0855 04/27/17 2305 04/28/17 0305 04/29/17 0309 04/30/17 0337 05/01/17 0412  WBC 2.4*  --  1.9* 2.2* 1.9* 2.6* 2.5* 2.3*  NEUTROABS 1.3*  --  1.0*  --   --   --   --   --   HGB 8.6*  < > 6.7* 8.5* 8.3* 9.2* 8.4* 7.9*  HCT 26.6*  < > 20.0* 24.9* 24.3* 27.7* 25.0* 23.3*  MCV 83.9  --  82.6 83.3 85.0 84.2 85.0 85.7  PLT 36*  --  53* 43* 41* 43* 37* 64*  < > = values in this interval not displayed. Basic Metabolic Panel:  Recent Labs Lab 04/26/17 1405 04/26/17 1825 04/30/17 0337  05/01/17 0412  NA 140  --  138 141  K 3.8  --  4.0 3.9  CL 101  --  103 104  CO2  --   --  31 31  GLUCOSE 100*  --  115* 106*  BUN 17  --  20 18  CREATININE 1.00  --  1.03 1.06  CALCIUM  --   --  8.5* 8.5*  MG  --  2.1  --   --   PHOS  --  4.3  --   --    GFR: Estimated Creatinine Clearance: 58.6 mL/min (by C-G formula based on SCr of 1.06 mg/dL). Liver Function Tests: No results for input(s): AST, ALT, ALKPHOS, BILITOT, PROT, ALBUMIN in the last 168 hours. No results for input(s): LIPASE, AMYLASE in the last 168 hours. No results for input(s): AMMONIA in the last 168 hours. Coagulation Profile:  Recent Labs Lab 04/26/17 1825  INR 1.15   Cardiac Enzymes: No results for input(s): CKTOTAL, CKMB, CKMBINDEX, TROPONINI in the last 168 hours. BNP (last 3 results) No results for input(s): PROBNP in the last 8760 hours. HbA1C: No results for input(s): HGBA1C in the last 72 hours. CBG: No results for input(s): GLUCAP in the last 168 hours. Lipid Profile: No results for input(s): CHOL, HDL, LDLCALC, TRIG, CHOLHDL, LDLDIRECT in the last 72 hours. Thyroid Function Tests: No results for input(s): TSH, T4TOTAL, FREET4, T3FREE, THYROIDAB in the last 72 hours. Anemia Panel: No results for input(s): VITAMINB12, FOLATE, FERRITIN, TIBC, IRON, RETICCTPCT in the last 72 hours. Urine analysis:  Sepsis Labs: @LABRCNTIP (procalcitonin:4,lacticidven:4)   )No results found for this or any previous visit (from the past 240 hour(s)).    Radiology Studies: No results found.      Scheduled Meds: . gabapentin  200 mg Oral 3 times per day  . LORazepam  1 mg Oral QHS  . nortriptyline  25 mg Oral QHS  . triamcinolone cream  1 application Topical q morning - 10a   Continuous Infusions: . sodium chloride       LOS: 5 days    Time spent: 25 minutes  Greater than 50% of the time spent on counseling and coordinating the care.   Leisa Lenz, MD [facility] Pager  [phone]  If 7PM-7AM, please contact night-coverage [email or web address] Password [email or web address] 05/01/2017, 10:25 AM

## 2017-05-01 NOTE — Progress Notes (Signed)
  Subjective: Patient reports no bladder pain. No need for catheter irrigation over past 24 hrs  Objective: Vital signs in last 24 hours: Temp:  [97.7 F (36.5 C)-99.2 F (37.3 C)] 97.7 F (36.5 C) (07/07 0610) Pulse Rate:  [82-96] 96 (07/07 0610) Resp:  [16] 16 (07/07 0610) BP: (104-139)/(63-76) 128/76 (07/07 0610) SpO2:  [97 %-100 %] 98 % (07/07 0610)  Intake/Output from previous day: 07/06 0701 - 07/07 0700 In: 847 [P.O.:120; I.V.:250; Blood:297] Out: 900 [Urine:900] Intake/Output this shift: Total I/O In: 480 [P.O.:480] Out: -   Physical Exam:  Constitutional: Vital signs reviewed. WD WN in NAD   Eyes: PERRL, No scleral icterus.   Cardiovascular: RRR Pulmonary/Chest: Normal effort   Urine amber w/o clots  Lab Results:  Recent Labs  04/29/17 0309 04/30/17 0337 05/01/17 0412  HGB 9.2* 8.4* 7.9*  HCT 27.7* 25.0* 23.3*   BMET  Recent Labs  04/30/17 0337 05/01/17 0412  NA 138 141  K 4.0 3.9  CL 103 104  CO2 31 31  GLUCOSE 115* 106*  BUN 20 18  CREATININE 1.03 1.06  CALCIUM 8.5* 8.5*   No results for input(s): LABPT, INR in the last 72 hours. No results for input(s): LABURIN in the last 72 hours. Results for orders placed or performed during the hospital encounter of 04/19/17  Culture, Urine     Status: Abnormal   Collection Time: 04/19/17 10:19 PM  Result Value Ref Range Status   Specimen Description URINE, CATHETERIZED  Final   Special Requests NONE  Final   Culture (A)  Final    >=100,000 COLONIES/mL KLEBSIELLA OXYTOCA >=100,000 COLONIES/mL PSEUDOMONAS AERUGINOSA    Report Status 04/23/2017 FINAL  Final   Organism ID, Bacteria KLEBSIELLA OXYTOCA (A)  Final   Organism ID, Bacteria PSEUDOMONAS AERUGINOSA (A)  Final      Susceptibility   Klebsiella oxytoca - MIC*    AMPICILLIN >=32 RESISTANT Resistant     CEFAZOLIN >=64 RESISTANT Resistant     CEFTRIAXONE <=1 SENSITIVE Sensitive     CIPROFLOXACIN <=0.25 SENSITIVE Sensitive     GENTAMICIN  <=1 SENSITIVE Sensitive     IMIPENEM <=0.25 SENSITIVE Sensitive     NITROFURANTOIN <=16 SENSITIVE Sensitive     TRIMETH/SULFA <=20 SENSITIVE Sensitive     AMPICILLIN/SULBACTAM 8 SENSITIVE Sensitive     PIP/TAZO <=4 SENSITIVE Sensitive     Extended ESBL NEGATIVE Sensitive     * >=100,000 COLONIES/mL KLEBSIELLA OXYTOCA   Pseudomonas aeruginosa - MIC*    CEFTAZIDIME <=1 SENSITIVE Sensitive     CIPROFLOXACIN <=0.25 SENSITIVE Sensitive     GENTAMICIN <=1 SENSITIVE Sensitive     IMIPENEM 1 SENSITIVE Sensitive     PIP/TAZO <=4 SENSITIVE Sensitive     CEFEPIME <=1 SENSITIVE Sensitive     * >=100,000 COLONIES/mL PSEUDOMONAS AERUGINOSA    Studies/Results: No results found.  Assessment/Plan:   Bladder hemorrhage w/ thrombocytopenia--resolved @ present w/ plt transfusions. For now. No GU intervention needed. Consider catheter d/c in 2 days w/ return to I/o cath if urine remains clear   LOS: 5 days   Franchot Gallo M 05/01/2017, 9:03 AM

## 2017-05-02 LAB — BASIC METABOLIC PANEL
Anion gap: 7 (ref 5–15)
BUN: 22 mg/dL — AB (ref 6–20)
CHLORIDE: 103 mmol/L (ref 101–111)
CO2: 32 mmol/L (ref 22–32)
Calcium: 8.6 mg/dL — ABNORMAL LOW (ref 8.9–10.3)
Creatinine, Ser: 0.98 mg/dL (ref 0.61–1.24)
Glucose, Bld: 103 mg/dL — ABNORMAL HIGH (ref 65–99)
POTASSIUM: 3.9 mmol/L (ref 3.5–5.1)
SODIUM: 142 mmol/L (ref 135–145)

## 2017-05-02 LAB — CBC
HCT: 26.4 % — ABNORMAL LOW (ref 39.0–52.0)
HEMOGLOBIN: 8.8 g/dL — AB (ref 13.0–17.0)
MCH: 28.8 pg (ref 26.0–34.0)
MCHC: 33.3 g/dL (ref 30.0–36.0)
MCV: 86.3 fL (ref 78.0–100.0)
PLATELETS: 63 10*3/uL — AB (ref 150–400)
RBC: 3.06 MIL/uL — AB (ref 4.22–5.81)
RDW: 18.5 % — ABNORMAL HIGH (ref 11.5–15.5)
WBC: 2.6 10*3/uL — AB (ref 4.0–10.5)

## 2017-05-02 NOTE — Progress Notes (Signed)
Patient ID: Jordan Terry, male   DOB: Dec 08, 1931, 83 y.o.   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 He had a similar episode in the setting of thrombocytopenia a week prior requiring platelet transfusion and platelets again were found to be down to 36 on arrival. Hemoglobin had dropped to 8.6 from 9.5 as well.  Patient was given 1 unit of platelets and 2 U PRBC so far during this hospital stay.    Assessment & Plan:   Principal Problem: Gross hematuria / Acute blood loss anemia - Thought to be from traumatic Foley insertion due to urethral stricture precipitated by thrombocytopenia - S/P 2 U platelets transfusion , last one given 7/6 - S/P 3 U PRBC transfusion this hospitalization, last one given 7/7 - Urine clearing up - May be able to remove foley tomorrow - Follow up with GU   Active Problems: Thyroid lobe nodule - Outpt follow up  Pancytopenia ([place]) - Likely due to history of prostate cancer - CT bone marrow bx done 6/26 - no obvious B cell lymphoproliferative disorder despite monoclonal B-cell population ; possible MDS but non-clonal caused of dysplasia must be excluded such as drug/toxin exposure, GF therapy, viral infection, nutritional deficiency (B12, copper...)  History of prostate cancer - Follow up with onc  Klebsiella and pseudomonas UTI - Completed cipro for 7 days   Depression - Continue ativan and pamelor   DVT prophylaxis: SCD's Code Status: full code  Family Communication: no family this am at bedside, spoke with daughter 81/7 Disposition Plan: possible D/C in 24-48  hours if bleeding resolves    Consultants:   Urology, Dr. Franchot Gallo  Procedures:   ECHO 7/3 - EF 50% and grade 1 DD  Antimicrobials:   Cipro completed   Subjective: Feels better.  Objective: Vitals:   05/01/17 1415 05/01/17 1929 05/02/17 0400 05/02/17 1300  BP: 135/80 139/77 114/63 120/73  Pulse: 79 82 74 75  Resp: 16 18 18 18   Temp: 98.6 F (37 C) 98.4 F (36.9 C) 98 F (36.7 C) 98.3 F (36.8 C)  TempSrc: Oral Oral Oral Oral  SpO2: 100% 100% 100% 99%  Weight:      Height:        Intake/Output Summary (Last 24 hours) at 05/02/17 1657 Last data filed at 05/02/17 1437  Gross per 24 hour  Intake              480 ml  Output              825 ml  Net             -345 ml   Filed Weights   04/27/17 1900  Weight: 77.1 kg (170 lb)    Examination:  General exam: Appears calm and comfortable  Respiratory system: Clear to auscultation. Respiratory effort normal. Cardiovascular system: S1 & S2 heard, Rate controlled  Gastrointestinal system: Abdomen is nondistended, soft and nontender. No organomegaly or masses felt. Normal bowel sounds heard. Central nervous system: Alert and oriented. No focal neurological deficits. Extremities: Symmetric 5 x 5 power. Skin: No rashes, lesions or ulcers Psychiatry: Judgement and insight appear normal. Mood &  affect appropriate.   Data Reviewed: I have personally reviewed following labs and imaging studies  CBC:  Recent Labs Lab 04/26/17 1351  04/27/17 0855  04/28/17 0305 04/29/17 0309 04/30/17 0337 05/01/17 0412 05/02/17 0351  WBC 2.4*  --  1.9*  < > 1.9* 2.6* 2.5* 2.3* 2.6*  NEUTROABS 1.3*  --  1.0*  --   --   --   --   --   --   HGB 8.6*  < > 6.7*  < > 8.3* 9.2* 8.4* 7.9* 8.8*  HCT 26.6*  < > 20.0*  < > 24.3* 27.7* 25.0* 23.3* 26.4*  MCV 83.9  --  82.6  < > 85.0 84.2 85.0 85.7 86.3  PLT 36*  --  53*  < > 41* 43* 37* 64* 63*  < > = values in this interval not displayed. Basic Metabolic Panel:  Recent  Labs Lab 04/26/17 1405 04/26/17 1825 04/30/17 0337 05/01/17 0412 05/02/17 0351  NA 140  --  138 141 142  K 3.8  --  4.0 3.9 3.9  CL 101  --  103 104 103  CO2  --   --  31 31 32  GLUCOSE 100*  --  115* 106* 103*  BUN 17  --  20 18 22*  CREATININE 1.00  --  1.03 1.06 0.98  CALCIUM  --   --  8.5* 8.5* 8.6*  MG  --  2.1  --   --   --   PHOS  --  4.3  --   --   --    GFR: Estimated Creatinine Clearance: 63.4 mL/min (by C-G formula based on SCr of 0.98 mg/dL). Liver Function Tests: No results for input(s): AST, ALT, ALKPHOS, BILITOT, PROT, ALBUMIN in the last 168 hours. No results for input(s): LIPASE, AMYLASE in the last 168 hours. No results for input(s): AMMONIA in the last 168 hours. Coagulation Profile:  Recent Labs Lab 04/26/17 1825  INR 1.15   Cardiac Enzymes: No results for input(s): CKTOTAL, CKMB, CKMBINDEX, TROPONINI in the last 168 hours. BNP (last 3 results) No results for input(s): PROBNP in the last 8760 hours. HbA1C: No results for input(s): HGBA1C in the last 72 hours. CBG: No results for input(s): GLUCAP in the last 168 hours. Lipid Profile: No results for input(s): CHOL, HDL, LDLCALC, TRIG, CHOLHDL, LDLDIRECT in the last 72 hours. Thyroid Function Tests: No results for input(s): TSH, T4TOTAL, FREET4, T3FREE, THYROIDAB in the last 72 hours. Anemia Panel: No results for input(s): VITAMINB12, FOLATE, FERRITIN, TIBC, IRON, RETICCTPCT in the last 72 hours. Urine analysis:    Component Value Date/Time   COLORURINE YELLOW 04/19/2017 2219   APPEARANCEUR CLOUDY (A) 04/19/2017 2219   LABSPEC 1.013 04/19/2017 2219   PHURINE 7.0 04/19/2017 2219   GLUCOSEU NEGATIVE 04/19/2017 2219   HGBUR LARGE (A) 04/19/2017 2219   BILIRUBINUR NEGATIVE 04/19/2017 [place] 04/19/2017 2219   PROTEINUR 100 (A) 04/19/2017 2219   UROBILINOGEN 0.2 03/29/2015 0611   NITRITE POSITIVE (A) 04/19/2017 2219   LEUKOCYTESUR LARGE (A) 04/19/2017 2219   Sepsis  Labs: @LABRCNTIP (procalcitonin:4,lacticidven:4)   )No results found for this or any previous visit (from the past 240 hour(s)).    Radiology Studies: No results found.      Scheduled Meds: . gabapentin  200 mg Oral 3 times per day  . LORazepam  1 mg Oral QHS  . nortriptyline  25 mg Oral QHS  . triamcinolone cream  1 application Topical q morning -  10a   Continuous Infusions: . sodium chloride       LOS: 6 days    Time spent: 15 minutes  Greater than 50% of the time spent on counseling and coordinating the care.   Leisa Lenz, MD [facility] Pager [phone]  If 7PM-7AM, please contact night-coverage [email or web address] Password [facility] 05/02/2017, 4:57 PM

## 2017-05-02 NOTE — Progress Notes (Signed)
  Subjective: Patient reports no significant problems with catheter or bladder pain over the past 24 hours.  Objective: Vital signs in last 24 hours: Temp:  [98 F (36.7 C)-98.6 F (37 C)] 98 F (36.7 C) (07/08 0400) Pulse Rate:  [74-91] 74 (07/08 0400) Resp:  [16-18] 18 (07/08 0400) BP: (113-139)/(63-80) 114/63 (07/08 0400) SpO2:  [98 %-100 %] 100 % (07/08 0400)  Intake/Output from previous day: 07/07 0701 - 07/08 0700 In: 1295 [P.O.:960; Blood:335] Out: 825 [Urine:825] Intake/Output this shift: No intake/output data recorded.  Physical Exam:  Constitutional: Vital signs reviewed. WD WN in NAD   Eyes: PERRL, No scleral icterus.   Pulmonary/Chest: Normal effort Abdominal: Soft. Non-tender, non-distended, bowel sounds are normal, no masses, organomegaly, or guarding present.  Extremities: No cyanosis or edema    Urine within drainage tube and catheter bag is clear, without clots, yellow Lab Results:  Recent Labs  04/30/17 0337 05/01/17 0412 05/02/17 0351  HGB 8.4* 7.9* 8.8*  HCT 25.0* 23.3* 26.4*   BMET  Recent Labs  05/01/17 0412 05/02/17 0351  NA 141 142  K 3.9 3.9  CL 104 103  CO2 31 32  GLUCOSE 106* 103*  BUN 18 22*  CREATININE 1.06 0.98  CALCIUM 8.5* 8.6*   No results for input(s): LABPT, INR in the last 72 hours. No results for input(s): LABURIN in the last 72 hours. Results for orders placed or performed during the hospital encounter of 04/19/17  Culture, Urine     Status: Abnormal   Collection Time: 04/19/17 10:19 PM  Result Value Ref Range Status   Specimen Description URINE, CATHETERIZED  Final   Special Requests NONE  Final   Culture (A)  Final    >=100,000 COLONIES/mL KLEBSIELLA OXYTOCA >=100,000 COLONIES/mL PSEUDOMONAS AERUGINOSA    Report Status 04/23/2017 FINAL  Final   Organism ID, Bacteria KLEBSIELLA OXYTOCA (A)  Final   Organism ID, Bacteria PSEUDOMONAS AERUGINOSA (A)  Final      Susceptibility   Klebsiella oxytoca - MIC*   AMPICILLIN >=32 RESISTANT Resistant     CEFAZOLIN >=64 RESISTANT Resistant     CEFTRIAXONE <=1 SENSITIVE Sensitive     CIPROFLOXACIN <=0.25 SENSITIVE Sensitive     GENTAMICIN <=1 SENSITIVE Sensitive     IMIPENEM <=0.25 SENSITIVE Sensitive     NITROFURANTOIN <=16 SENSITIVE Sensitive     TRIMETH/SULFA <=20 SENSITIVE Sensitive     AMPICILLIN/SULBACTAM 8 SENSITIVE Sensitive     PIP/TAZO <=4 SENSITIVE Sensitive     Extended ESBL NEGATIVE Sensitive     * >=100,000 COLONIES/mL KLEBSIELLA OXYTOCA   Pseudomonas aeruginosa - MIC*    CEFTAZIDIME <=1 SENSITIVE Sensitive     CIPROFLOXACIN <=0.25 SENSITIVE Sensitive     GENTAMICIN <=1 SENSITIVE Sensitive     IMIPENEM 1 SENSITIVE Sensitive     PIP/TAZO <=4 SENSITIVE Sensitive     CEFEPIME <=1 SENSITIVE Sensitive     * >=100,000 COLONIES/mL PSEUDOMONAS AERUGINOSA    Studies/Results: No results found.  Assessment/Plan:   Hemorrhage from bladder/urethra, resolved with catheter drainage/platelet transfusion.  The big issue here is when to remove the catheter and start him back on twice-daily self-catheterization.  I will have the patient discuss this with Dr. Alinda Money, who more than likely will be back in the office this week.  For now, we will leave the catheter in.   LOS: 6 days   Franchot Gallo M 05/02/2017, 8:13 AM

## 2017-05-03 ENCOUNTER — Telehealth: Payer: Self-pay | Admitting: *Deleted

## 2017-05-03 DIAGNOSIS — N359 Urethral stricture, unspecified: Secondary | ICD-10-CM

## 2017-05-03 DIAGNOSIS — Z8546 Personal history of malignant neoplasm of prostate: Secondary | ICD-10-CM

## 2017-05-03 DIAGNOSIS — D61818 Other pancytopenia: Secondary | ICD-10-CM

## 2017-05-03 DIAGNOSIS — R310 Gross hematuria: Secondary | ICD-10-CM

## 2017-05-03 LAB — CBC
HEMATOCRIT: 24.5 % — AB (ref 39.0–52.0)
Hemoglobin: 8.2 g/dL — ABNORMAL LOW (ref 13.0–17.0)
MCH: 28.5 pg (ref 26.0–34.0)
MCHC: 33.5 g/dL (ref 30.0–36.0)
MCV: 85.1 fL (ref 78.0–100.0)
PLATELETS: 44 10*3/uL — AB (ref 150–400)
RBC: 2.88 MIL/uL — AB (ref 4.22–5.81)
RDW: 18.2 % — AB (ref 11.5–15.5)
WBC: 2 10*3/uL — AB (ref 4.0–10.5)

## 2017-05-03 LAB — TYPE AND SCREEN
ABO/RH(D): A POS
Antibody Screen: NEGATIVE
UNIT DIVISION: 0

## 2017-05-03 LAB — BPAM RBC
BLOOD PRODUCT EXPIRATION DATE: 201807232359
ISSUE DATE / TIME: 201807071144
UNIT TYPE AND RH: 6200

## 2017-05-03 MED ORDER — SODIUM CHLORIDE 0.9 % IV SOLN
Freq: Once | INTRAVENOUS | Status: AC
  Administered 2017-05-03: 250 mL via INTRAVENOUS

## 2017-05-03 MED ORDER — LIP MEDEX EX OINT
TOPICAL_OINTMENT | CUTANEOUS | Status: AC
  Filled 2017-05-03: qty 7

## 2017-05-03 NOTE — Progress Notes (Signed)
XAYNE BRUMBAUGH   DOB:Mar 30, 1932   QZ#:[id number]   FHL#:[id number]  Hematology and oncology follow-up note  Subjective: Patient was seen by my partner Dr. Irene Limbo on 6/25 for pancytopenia. He was admitted for severe hematuria, I am covering Dr. Irene Limbo to see him. He has received  1 unit of platelets and 2 units of red blood cell since admission. His hematuria has stopped.    Objective:  Vitals:   05/03/17 1319 05/03/17 1425  BP: 131/75 107/65  Pulse: 77 81  Resp: 18 18  Temp: 97.8 F (36.6 C) 97.9 F (36.6 C)    Body mass index is 21.83 kg/m.  Intake/Output Summary (Last 24 hours) at 05/03/17 1913 Last data filed at 05/03/17 1759  Gross per 24 hour  Intake             1365 ml  Output             1400 ml  Net              -35 ml     Sclerae unicteric  Oropharynx clear  No peripheral adenopathy  Lungs clear -- no rales or rhonchi  Heart regular rate and rhythm  Abdomen benign  MSK no focal spinal tenderness, no peripheral edema  Neuro nonfocal  Mild old blood at the foley, urine is clear   CBG (last 3)  No results for input(s): GLUCAP in the last 72 hours.   Labs:  Lab Results  Component Value Date   WBC 2.0 (L) 05/03/2017   HGB 8.2 (L) 05/03/2017   HCT 24.5 (L) 05/03/2017   MCV 85.1 05/03/2017   PLT 44 (L) 05/03/2017   NEUTROABS 1.0 (L) 04/27/2017    CMP Latest Ref Rng & Units 05/02/2017 05/01/2017 04/30/2017  Glucose 65 - 99 mg/dL 103(H) 106(H) 115(H)  BUN 6 - 20 mg/dL 22(H) 18 20  Creatinine 0.61 - 1.24 mg/dL 0.98 1.06 1.03  Sodium 135 - 145 mmol/L 142 141 138  Potassium 3.5 - 5.1 mmol/L 3.9 3.9 4.0  Chloride 101 - 111 mmol/L 103 104 103  CO2 22 - 32 mmol/L 32 31 31  Calcium 8.9 - 10.3 mg/dL 8.6(L) 8.5(L) 8.5(L)  Total Protein 6.4 - 8.3 g/dL - - -  Total Bilirubin 0.20 - 1.20 mg/dL - - -  Alkaline Phos 40 - 150 U/L - - -  AST 5 - 34 U/L - - -  ALT 0 - 55 U/L - - -    Urine Studies No results for input(s): UHGB, CRYS in the last 72 hours.  Invalid input(s):  UACOL, UAPR, USPG, UPH, UTP, UGL, UKET, UBIL, UNIT, UROB, ULEU, UEPI, UWBC, Duwayne Heck [place], [place]  Basic Metabolic Panel:  Recent Labs Lab 04/30/17 0337 05/01/17 0412 05/02/17 0351  NA 138 141 142  K 4.0 3.9 3.9  CL 103 104 103  CO2 31 31 32  GLUCOSE 115* 106* 103*  BUN 20 18 22*  CREATININE 1.03 1.06 0.98  CALCIUM 8.5* 8.5* 8.6*   GFR Estimated Creatinine Clearance: 63.4 mL/min (by C-G formula based on SCr of 0.98 mg/dL). Liver Function Tests: No results for input(s): AST, ALT, ALKPHOS, BILITOT, PROT, ALBUMIN in the last 168 hours. No results for input(s): LIPASE, AMYLASE in the last 168 hours. No results for input(s): AMMONIA in the last 168 hours. Coagulation profile No results for input(s): INR, PROTIME in the last 168 hours.  CBC:  Recent Labs Lab 04/27/17 3734  04/29/17 0309 04/30/17 2876 05/01/17 8115  05/02/17 0351 05/03/17 0334  WBC 1.9*  < > 2.6* 2.5* 2.3* 2.6* 2.0*  NEUTROABS 1.0*  --   --   --   --   --   --   HGB 6.7*  < > 9.2* 8.4* 7.9* 8.8* 8.2*  HCT 20.0*  < > 27.7* 25.0* 23.3* 26.4* 24.5*  MCV 82.6  < > 84.2 85.0 85.7 86.3 85.1  PLT 53*  < > 43* 37* 64* 63* 44*  < > = values in this interval not displayed. Cardiac Enzymes: No results for input(s): CKTOTAL, CKMB, CKMBINDEX, TROPONINI in the last 168 hours. BNP: Invalid input(s): POCBNP CBG: No results for input(s): GLUCAP in the last 168 hours. D-Dimer No results for input(s): DDIMER in the last 72 hours. Hgb A1c No results for input(s): HGBA1C in the last 72 hours. Lipid Profile No results for input(s): CHOL, HDL, LDLCALC, TRIG, CHOLHDL, LDLDIRECT in the last 72 hours. Thyroid function studies No results for input(s): TSH, T4TOTAL, T3FREE, THYROIDAB in the last 72 hours.  Invalid input(s): FREET3 Anemia work up No results for input(s): VITAMINB12, FOLATE, FERRITIN, TIBC, IRON, RETICCTPCT in the last 72 hours. Microbiology No results found for this or any previous visit (from the  past 240 hour(s)).  PATHOLOGY REPORT  Diagnosis 04/21/2017 Bone Marrow, Aspirate,Biopsy, and Clot, right iliac BONE MARROW: HYPERCELLULAR MARROW WITH INCREASED BLASTS AND FIBROSIS SEE COMMENT PERIPHERAL BLOOD: PANCYTOPENIA SEE COMPLETE BLOOD COUNT Diagnosis Note The material is limited with suboptimal aspirate smears and touch preparations. The core biopsy is hypercellular with an apparent increase in blasts, dysmegakaryopoiesis and fibrosis. There is no obvious B-cell lymphoproliferative disorder in the core biopsy, despite a monoclonal B-cell population detected in the peripheral blood. While the features are suggestive of underlying myelodysplastic syndrome, non-clonal causes of dysplasia must be excluded before the diagnosis of myelodysplasia is established. These include, but are not limited to, drug and toxin exposure, growth factor therapy, viral infections, immunologic disorders, and nutritional deficiencies (e.g., B12, copper, etc.). A repeat biopsy maybe be helpful for further classification (flow, cytogenetics and FISH).  Studies:  No results found.  Assessment: 83 y.o. 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  1. 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 Urethral bleeding and hematuria, secondary to thrombocytopenia 3. History of prostate cancer, NED 4. Depression  5. Urethral stricture, status post Foley placement. 6. Hearing loss   Plan:  -I have reviewed his bone marrow biopsy results with patient. The bone marrow biopsy was suspicious for MDS, but not definitive, bone marrow flow also showed a small clone of B-cell, lymphoproliferative disorder is not excluded. I'll discuss with the pathologist tomorrow, to see if he  needs a repeated bone marrow biopsy for more definitive diagnosis.  -He did have MTX before, but last treatment was 2 months ago, I don't think his severe pancytopenia is related to methotrexate  -His previous lab showed normal L93, folic acid, he has never had post factor, we'll recent virus infection. -Further treatment recommendation will based on the definite diagnosis. -I agree with supportive care right now, consider plt transfusion for active bleeding, and blood transfusion if hemoglobin less than 8.0. -I'll keep him nothing by mouth after midnight today, in case he needs bone marrow biopsy tomorrow morning. I'll call patient as well as soon as I  find out if he will have the biopsy or not tomorrow morning.  -I'll follow him up in the hospital. -I have called patient's wife and daughter and discussed above. He agree with the plan.   Truitt Merle, MD 05/03/2017  7:13 PM

## 2017-05-03 NOTE — Care Management Important Message (Signed)
Important Message  Patient Details  Name: Jordan Terry MRN: [id number] Date of Birth: 05-13-1932   Medicare Important Message Given:  Yes    Kerin Salen 05/03/2017, 9:41 AMImportant Message  Patient Details  Name: Jordan Terry MRN: [id number] Date of Birth: 02-05-1932   Medicare Important Message Given:  Yes    Kerin Salen 05/03/2017, 9:41 AM

## 2017-05-03 NOTE — Telephone Encounter (Signed)
"  This is Jordan Terry calling about my father who's been admitted, receiving platelet transfusions since he's bleeding and low platelet counts.  The doctors here cannot see the Bone Marrow Biopsy Results.  I know Dr. Irene Limbo is out of town.  The results should be in.  Someone in the office will communicate with the hospital or call the family the results correct.  I am listed as contact and can get the results."  No results found at this time.  The full complete results can take up to fourteen business days.  Provider will schedule F/U appointment to go over results.  [place] can sign for results.  Answered questions about differences with P.O.A., durable verses health care P.O.A., advanced directives, Release of information and emergency contacts.  Suggested use of hospital to help with Advanced Directives, Health P.O.A.  Hospitals and doctor's offices need a copy for their patient files. In [place] says she will contact her father's lawyer to make sure these weren't previously prepared.

## 2017-05-03 NOTE — Progress Notes (Signed)
Patient ID: Jordan Terry, male   DOB: 1932/10/15, 83 y.o.   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 He had a similar episode in the setting of thrombocytopenia a week prior requiring platelet transfusion and platelets again were found to be down to 36 on arrival. Hemoglobin had dropped to 8.6 from 9.5 as well.  Patient was given 1 unit of platelets and 2 U PRBC so far during this hospital stay.    Assessment & Plan:   Principal Problem: Gross hematuria / Acute blood loss anemia - Thought to be from traumatic Foley insertion due to urethral stricture precipitated by thrombocytopenia - S/P 2 U platelets transfusion , last one given 7/6 - S/P 3 U PRBC transfusion this hospitalization, last one given 7/7 - Hemoglobin remains stable but platelets down to 40's this am - Give 1 more unit platelets today due to hematuria this am - Appreciate GU input  Active Problems: Thyroid lobe nodule - Outpt follow up   Pancytopenia ([place]) - Likely due to history of prostate cancer - CT bone marrow bx done 6/26 - no obvious B cell lymphoproliferative disorder despite monoclonal B-cell population ; possible MDS but non-clonal caused of dysplasia must be excluded such as drug/toxin exposure, GF therapy, viral infection, nutritional deficiency (B12, copper...) - Appreciate oncology input   History of prostate cancer - Follow up with oncology   Klebsiella and pseudomonas UTI - Completed cipro for 7 days   Depression - Continue ativan and pamelor   DVT prophylaxis: SCD's due to thrombocytopenia  Code Status: full  code  Family Communication: no family at the bedside  Disposition Plan: D/C once platelets stable for at least 48 hours    Consultants:   Urology, Dr. Franchot Gallo  Oncology   Procedures:   ECHO 7/3 - EF 50% and grade 1 DD  Antimicrobials:   Cipro completed   Subjective: No overngiht events. Had hematuria this am.  Objective: Vitals:   05/02/17 0400 05/02/17 1300 05/02/17 2015 05/03/17 0455  BP: 114/63 120/73 109/73 107/65  Pulse: 74 75 78 74  Resp: 18 18 20 20   Temp: 98 F (36.7 C) 98.3 F (36.8 C) 98.4 F (36.9 C) 97.9 F (36.6 C)  TempSrc: Oral Oral Oral Oral  SpO2: 100% 99% 98% 98%  Weight:      Height:        Intake/Output Summary (Last 24 hours) at 05/03/17 1013 Last data filed at 05/03/17 0954  Gross per 24 hour  Intake              720 ml  Output             1425 ml  Net             -705 ml   Filed Weights   04/27/17 1900  Weight: 77.1 kg (170 lb)    Examination:  General exam: Appears calm and comfortable  Respiratory system: Clear to auscultation. Respiratory effort normal. Cardiovascular system: S1 & S2 heard, Rate controlled  Gastrointestinal system: Abdomen is nondistended, soft and nontender. No organomegaly or masses felt. Normal bowel sounds heard. Central nervous system: Alert and  oriented. No focal neurological deficits. Extremities: Symmetric 5 x 5 power. Skin: No rashes, lesions or ulcers Psychiatry: Judgement and insight appear normal. Mood & affect appropriate.   Data Reviewed: I have personally reviewed following labs and imaging studies  CBC:  Recent Labs Lab 04/26/17 1351  04/27/17 0855  04/29/17 0309 04/30/17 0337 05/01/17 0412 05/02/17 0351 05/03/17 0334  WBC 2.4*  --  1.9*  < > 2.6* 2.5* 2.3* 2.6* 2.0*  NEUTROABS 1.3*  --  1.0*  --   --   --   --   --   --   HGB 8.6*  < > 6.7*  < > 9.2* 8.4* 7.9* 8.8* 8.2*  HCT 26.6*  < > 20.0*  < > 27.7* 25.0* 23.3* 26.4* 24.5*  MCV 83.9  --  82.6  < > 84.2 85.0 85.7  86.3 85.1  PLT 36*  --  53*  < > 43* 37* 64* 63* 44*  < > = values in this interval not displayed. Basic Metabolic Panel:  Recent Labs Lab 04/26/17 1405 04/26/17 1825 04/30/17 0337 05/01/17 0412 05/02/17 0351  NA 140  --  138 141 142  K 3.8  --  4.0 3.9 3.9  CL 101  --  103 104 103  CO2  --   --  31 31 32  GLUCOSE 100*  --  115* 106* 103*  BUN 17  --  20 18 22*  CREATININE 1.00  --  1.03 1.06 0.98  CALCIUM  --   --  8.5* 8.5* 8.6*  MG  --  2.1  --   --   --   PHOS  --  4.3  --   --   --    GFR: Estimated Creatinine Clearance: 63.4 mL/min (by C-G formula based on SCr of 0.98 mg/dL). Liver Function Tests: No results for input(s): AST, ALT, ALKPHOS, BILITOT, PROT, ALBUMIN in the last 168 hours. No results for input(s): LIPASE, AMYLASE in the last 168 hours. No results for input(s): AMMONIA in the last 168 hours. Coagulation Profile:  Recent Labs Lab 04/26/17 1825  INR 1.15   Cardiac Enzymes: No results for input(s): CKTOTAL, CKMB, CKMBINDEX, TROPONINI in the last 168 hours. BNP (last 3 results) No results for input(s): PROBNP in the last 8760 hours. HbA1C: No results for input(s): HGBA1C in the last 72 hours. CBG: No results for input(s): GLUCAP in the last 168 hours. Lipid Profile: No results for input(s): CHOL, HDL, LDLCALC, TRIG, CHOLHDL, LDLDIRECT in the last 72 hours. Thyroid Function Tests: No results for input(s): TSH, T4TOTAL, FREET4, T3FREE, THYROIDAB in the last 72 hours. Anemia Panel: No results for input(s): VITAMINB12, FOLATE, FERRITIN, TIBC, IRON, RETICCTPCT in the last 72 hours. Urine analysis:    Component Value Date/Time   COLORURINE YELLOW 04/19/2017 2219   APPEARANCEUR CLOUDY (A) 04/19/2017 2219   LABSPEC 1.013 04/19/2017 2219   PHURINE 7.0 04/19/2017 2219   GLUCOSEU NEGATIVE 04/19/2017 2219   HGBUR LARGE (A) 04/19/2017 2219   BILIRUBINUR NEGATIVE 04/19/2017 2219   [place] 04/19/2017 2219   PROTEINUR 100 (A) 04/19/2017 2219    UROBILINOGEN 0.2 03/29/2015 0611   NITRITE POSITIVE (A) 04/19/2017 2219   LEUKOCYTESUR LARGE (A) 04/19/2017 2219   Sepsis Labs: @LABRCNTIP (procalcitonin:4,lacticidven:4)   )No results found for this or any previous visit (from the past 240 hour(s)).    Radiology Studies: No results found.      Scheduled Meds: . gabapentin  200 mg Oral 3 times per day  .  LORazepam  1 mg Oral QHS  . nortriptyline  25 mg Oral QHS  . triamcinolone cream  1 application Topical q morning - 10a   Continuous Infusions: . sodium chloride    . sodium chloride       LOS: 7 days    Time spent: 15 minutes  Greater than 50% of the time spent on counseling and coordinating the care.   Leisa Lenz, MD [facility] Pager [phone]  If 7PM-7AM, please contact night-coverage [email or web address] Password [email or web address] 05/03/2017, 10:13 AM

## 2017-05-04 ENCOUNTER — Telehealth: Payer: Self-pay

## 2017-05-04 LAB — CBC
HEMATOCRIT: 24.4 % — AB (ref 39.0–52.0)
Hemoglobin: 8.1 g/dL — ABNORMAL LOW (ref 13.0–17.0)
MCH: 28.7 pg (ref 26.0–34.0)
MCHC: 33.2 g/dL (ref 30.0–36.0)
MCV: 86.5 fL (ref 78.0–100.0)
PLATELETS: 59 10*3/uL — AB (ref 150–400)
RBC: 2.82 MIL/uL — ABNORMAL LOW (ref 4.22–5.81)
RDW: 18 % — AB (ref 11.5–15.5)
WBC: 1.8 10*3/uL — AB (ref 4.0–10.5)

## 2017-05-04 LAB — BPAM PLATELET PHERESIS
BLOOD PRODUCT EXPIRATION DATE: 201807101215
ISSUE DATE / TIME: 201807091257
Unit Type and Rh: 6200

## 2017-05-04 LAB — PREPARE PLATELET PHERESIS: UNIT DIVISION: 0

## 2017-05-04 MED ORDER — SENNA 8.6 MG PO TABS: 1.0000 | ORAL_TABLET | Freq: Every day | ORAL | Status: DC | PRN

## 2017-05-04 NOTE — Telephone Encounter (Signed)
Spoke with daughter-in-law who I was given permission to speak to by the pt. Pt having trouble hearing at this time. Communicated scheduled appts on 7/13, this Friday, and doctor appt being expected time to discuss bone marrow biopsy results with Dr. Perlov. Pt in the hospital at this time. Famiyl member advised to call back Thursday if it appears that pt will not be out of hospital by that time. Family member verbalized understanding, and read back appt schedule. 

## 2017-05-04 NOTE — Progress Notes (Signed)
Jordan Terry   DOB:[birth year]   WE#:[id number]   ELF#:[id number]  Hematology and oncology follow-up note  Subjective: Patient is doing well overall, no more him to urea or urethral bleeding. His platelet counts has increased after transfusion. No other new complaints.   Objective:  Vitals:   05/04/17 0513 05/04/17 1417  BP: 120/83 132/76  Pulse: 76 84  Resp: 18 18  Temp: 97.9 F (36.6 C) 98 F (36.7 C)    Body mass index is 21.83 kg/m.  Intake/Output Summary (Last 24 hours) at 05/04/17 1826 Last data filed at 05/04/17 1720  Gross per 24 hour  Intake              240 ml  Output             2150 ml  Net            -1910 ml     Sclerae unicteric  Oropharynx clear  No peripheral adenopathy  Lungs clear -- no rales or rhonchi  Heart regular rate and rhythm  Abdomen benign  MSK no focal spinal tenderness, no peripheral edema  Neuro nonfocal  Mild old blood at the foley, urine is clear   CBG (last 3)  No results for input(s): GLUCAP in the last 72 hours.   Labs:  Lab Results  Component Value Date   WBC 1.8 (L) 05/04/2017   HGB 8.1 (L) 05/04/2017   HCT 24.4 (L) 05/04/2017   MCV 86.5 05/04/2017   PLT 59 (L) 05/04/2017   NEUTROABS 1.0 (L) 04/27/2017    CMP Latest Ref Rng & Units 05/02/2017 05/01/2017 04/30/2017  Glucose 65 - 99 mg/dL 103(H) 106(H) 115(H)  BUN 6 - 20 mg/dL 22(H) 18 20  Creatinine 0.61 - 1.24 mg/dL 0.98 1.06 1.03  Sodium 135 - 145 mmol/L 142 141 138  Potassium 3.5 - 5.1 mmol/L 3.9 3.9 4.0  Chloride 101 - 111 mmol/L 103 104 103  CO2 22 - 32 mmol/L 32 31 31  Calcium 8.9 - 10.3 mg/dL 8.6(L) 8.5(L) 8.5(L)  Total Protein 6.4 - 8.3 g/dL - - -  Total Bilirubin 0.20 - 1.20 mg/dL - - -  Alkaline Phos 40 - 150 U/L - - -  AST 5 - 34 U/L - - -  ALT 0 - 55 U/L - - -    Urine Studies No results for input(s): UHGB, CRYS in the last 72 hours.  Invalid input(s): UACOL, UAPR, USPG, UPH, UTP, UGL, UKET, UBIL, UNIT, UROB, ULEU, UEPI, UWBC, [place],  [place]  Basic Metabolic Panel:  Recent Labs Lab 04/30/17 0337 05/01/17 0412 05/02/17 0351  NA 138 141 142  K 4.0 3.9 3.9  CL 103 104 103  CO2 31 31 32  GLUCOSE 115* 106* 103*  BUN 20 18 22*  CREATININE 1.03 1.06 0.98  CALCIUM 8.5* 8.5* 8.6*   GFR Estimated Creatinine Clearance: 63.4 mL/min (by C-G formula based on SCr of 0.98 mg/dL). Liver Function Tests: No results for input(s): AST, ALT, ALKPHOS, BILITOT, PROT, ALBUMIN in the last 168 hours. No results for input(s): LIPASE, AMYLASE in the last 168 hours. No results for input(s): AMMONIA in the last 168 hours. Coagulation profile No results for input(s): INR, PROTIME in the last 168 hours.  CBC:  Recent Labs Lab 04/30/17 0337 05/01/17 0412 05/02/17 0351 05/03/17 0334 05/04/17 0405  WBC 2.5* 2.3* 2.6* 2.0* 1.8*  HGB 8.4* 7.9* 8.8* 8.2* 8.1*  HCT 25.0* 23.3* 26.4* 24.5* 24.4*  MCV 85.0  85.7 86.3 85.1 86.5  PLT 37* 64* 63* 44* 59*   Cardiac Enzymes: No results for input(s): CKTOTAL, CKMB, CKMBINDEX, TROPONINI in the last 168 hours. BNP: Invalid input(s): POCBNP CBG: No results for input(s): GLUCAP in the last 168 hours. D-Dimer No results for input(s): DDIMER in the last 72 hours. Hgb A1c No results for input(s): HGBA1C in the last 72 hours. Lipid Profile No results for input(s): CHOL, HDL, LDLCALC, TRIG, CHOLHDL, LDLDIRECT in the last 72 hours. Thyroid function studies No results for input(s): TSH, T4TOTAL, T3FREE, THYROIDAB in the last 72 hours.  Invalid input(s): FREET3 Anemia work up No results for input(s): VITAMINB12, FOLATE, FERRITIN, TIBC, IRON, RETICCTPCT in the last 72 hours. Microbiology No results found for this or any previous visit (from the past 240 hour(s)).  PATHOLOGY REPORT  Diagnosis 04/21/2017 Bone Marrow, Aspirate,Biopsy, and Clot, right iliac BONE MARROW: HYPERCELLULAR MARROW WITH INCREASED BLASTS AND FIBROSIS SEE COMMENT PERIPHERAL BLOOD: PANCYTOPENIA SEE COMPLETE BLOOD  COUNT Diagnosis Note The material is limited with suboptimal aspirate smears and touch preparations. The core biopsy is hypercellular with an apparent increase in blasts, dysmegakaryopoiesis and fibrosis. There is no obvious B-cell lymphoproliferative disorder in the core biopsy, despite a monoclonal B-cell population detected in the peripheral blood. While the features are suggestive of underlying myelodysplastic syndrome, non-clonal causes of dysplasia must be excluded before the diagnosis of myelodysplasia is established. These include, but are not limited to, drug and toxin exposure, growth factor therapy, viral infections, immunologic disorders, and nutritional deficiencies (e.g., B12, copper, etc.). A repeat biopsy maybe be helpful for further classification (flow, cytogenetics and FISH).  Studies:  No results found.  Assessment: 83 y.o. 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  1. 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 Urethral bleeding and hematuria, secondary to thrombocytopenia, stopped now  3. History of prostate cancer, NED 4. Depression  5. Urethral stricture, status post Foley placement. 6. Hearing loss   Plan:  -I have reviewed his bone marrow biopsy results with the pathologist Dr. Melina Copa. She recommends to have a repeated bone marrow biopsy for more definitive diagnosis. Patient is agreeable. -I have tried to arrange the bone marrow biopsy through interventional radiology, unfortunately they're not available for this week. I have asked my partner Dr. Lebron Conners to do the bone marrow biopsy at his bedside around 10:00 tomorrow morning. Will keep him NPO tonight. Patient prefers to be sedated for the procedure. -If his pain  accounts stable tomorrow, and no new bleeding. He can be discharged after the bone marrow biopsy. I'll call him in a few days with the bone marrow biopsy results. -will schedule his f/u with Dr. Irene Limbo next week.  -I have discussed the above with pt, his daughter Butch Penny and Dr. Vonda Antigua, MD 05/04/2017  6:26 PM

## 2017-05-04 NOTE — Progress Notes (Signed)
Patient ID: Jordan Terry, male   DOB: 11-01-1931, 83 y.o.   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 He had a similar episode in the setting of thrombocytopenia a week prior requiring platelet transfusion and platelets again were found to be down to 36 on arrival. Hemoglobin had dropped to 8.6 from 9.5 as well.  Hospital course is complicated with ongoing hematuria. Pt was seen by urology, had very short course of bladder irrigation. Pt was transfused so far 3 U platelets and 3 U PRBC transfusion. He continues to have thrombocytopenia and bone marrow biopsy done 6/26 did not come with very conclusive results and needs to be repeated.  Patient is not stable for discharge at this point. Thrombocytopenia and hematuria is unpredictable and he is at high risk of bleeding. Therefore, we will continue to monitor CBC and most likely pt will have BMB by the ned of the week.    Assessment & Plan:   Principal Problem: Gross hematuria / Acute blood loss anemia - Thought to be from traumatic Foley insertion due to urethral stricture precipitated by thrombocytopenia - Pt has had 3 U PRBC and 3 U Platelets so dar during this hospital stay - He had some dry blood this am but not sure if he had hematuria - Platelets are 59 this am, hemoglobin 8.1 - Repeat bone marrow biopsy tomorrow or Friday depending on IR schedule - GU saw pt 7/8, Dr. Dorina Hoyer to talk with Dr. Alinda Money about plan of care if hematuria persists   Active Problems: Thyroid lobe nodule - Outpt follow up  Pancytopenia ([employer]) - Likely due to history of prostate  cancer - CT bone marrow bx done 6/26 - no obvious B cell lymphoproliferative disorder despite monoclonal B-cell population ; possible MDS but non-clonal caused of dysplasia must be excluded such as drug/toxin exposure, GF therapy, viral infection, nutritional deficiency (B12, copper...) - Per oncology, possible MDS but really inconclusive findings and another BMB needs to be done, hopefully to be done this week  History of prostate cancer - Stable  Klebsiella and pseudomonas UTI - Completed cipro for 7 days   Depression - Continue ativan and pamelor   DVT prophylaxis: SCD's due to risk of bleeding  Code Status: full code  Family Communication: spoke with daughter over the phone Disposition Plan: home or SNF after bone marrow bx and if CBC stable  Consultants:   Urology, Dr. Franchot Gallo  Oncology, Dr. Burr Medico  IR  Procedures:   ECHO 7/3 - EF 50% and grade 1 DD  Antimicrobials:   Cipro completed   Subjective: No overnight events.  Objective: Vitals:   05/03/17 1319 05/03/17 1425 05/03/17 2054 05/04/17 0513  BP: 131/75 107/65 (!) 90/52 120/83  Pulse: 77 81 78 76  Resp: '18 18 17 18  ' Temp: 97.8 F (36.6 C) 97.9 F (36.6 C) 98.2 F (36.8 C) 97.9 F (36.6 C)  TempSrc: Oral Oral Oral Oral  SpO2: 100%  100% 100%  Weight:      Height:        Intake/Output Summary (Last 24 hours) at 05/04/17 1236 Last data filed at 05/04/17 1100  Gross  per 24 hour  Intake             1125 ml  Output             2450 ml  Net            -1325 ml   Filed Weights   04/27/17 1900  Weight: 77.1 kg (170 lb)    Examination:  Physical Exam  Constitutional: Appears well-developed and well-nourished. No distress.  CVS: RRR, S1/S2 (+) Pulmonary: Effort and breath sounds normal, no stridor, rhonchi, wheezes, rales.  Abdominal: Soft. BS +,  no distension, tenderness, rebound or guarding.  Musculoskeletal: Normal range of motion. No edema and no tenderness.    Lymphadenopathy: No lymphadenopathy noted, cervical, inguinal. Neuro: Alert. Normal reflexes, muscle tone coordination. No cranial nerve deficit. Skin: Skin is warm and dry.   Psychiatric: Normal mood and affect. Behavior, judgment, thought content normal.     Data Reviewed: I have personally reviewed following labs and imaging studies  CBC:  Recent Labs Lab 04/30/17 0337 05/01/17 0412 05/02/17 0351 05/03/17 0334 05/04/17 0405  WBC 2.5* 2.3* 2.6* 2.0* 1.8*  HGB 8.4* 7.9* 8.8* 8.2* 8.1*  HCT 25.0* 23.3* 26.4* 24.5* 24.4*  MCV 85.0 85.7 86.3 85.1 86.5  PLT 37* 64* 63* 44* 59*   Basic Metabolic Panel:  Recent Labs Lab 04/30/17 0337 05/01/17 0412 05/02/17 0351  NA 138 141 142  K 4.0 3.9 3.9  CL 103 104 103  CO2 31 31 32  GLUCOSE 115* 106* 103*  BUN 20 18 22*  CREATININE 1.03 1.06 0.98  CALCIUM 8.5* 8.5* 8.6*   GFR: Estimated Creatinine Clearance: 63.4 mL/min (by C-G formula based on SCr of 0.98 mg/dL). Liver Function Tests: No results for input(s): AST, ALT, ALKPHOS, BILITOT, PROT, ALBUMIN in the last 168 hours. No results for input(s): LIPASE, AMYLASE in the last 168 hours. No results for input(s): AMMONIA in the last 168 hours. Coagulation Profile: No results for input(s): INR, PROTIME in the last 168 hours. Cardiac Enzymes: No results for input(s): CKTOTAL, CKMB, CKMBINDEX, TROPONINI in the last 168 hours. BNP (last 3 results) No results for input(s): PROBNP in the last 8760 hours. HbA1C: No results for input(s): HGBA1C in the last 72 hours. CBG: No results for input(s): GLUCAP in the last 168 hours. Lipid Profile: No results for input(s): CHOL, HDL, LDLCALC, TRIG, CHOLHDL, LDLDIRECT in the last 72 hours. Thyroid Function Tests: No results for input(s): TSH, T4TOTAL, FREET4, T3FREE, THYROIDAB in the last 72 hours. Anemia Panel: No results for input(s): VITAMINB12, FOLATE, FERRITIN, TIBC, IRON, RETICCTPCT in the last 72 hours. Urine analysis:     Component Value Date/Time   COLORURINE YELLOW 04/19/2017 2219   APPEARANCEUR CLOUDY (A) 04/19/2017 2219   LABSPEC 1.013 04/19/2017 2219   PHURINE 7.0 04/19/2017 2219   GLUCOSEU NEGATIVE 04/19/2017 2219   HGBUR LARGE (A) 04/19/2017 2219   BILIRUBINUR NEGATIVE 04/19/2017 [place] 04/19/2017 2219   PROTEINUR 100 (A) 04/19/2017 2219   UROBILINOGEN 0.2 03/29/2015 0611   NITRITE POSITIVE (A) 04/19/2017 2219   LEUKOCYTESUR LARGE (A) 04/19/2017 2219   Sepsis Labs: '@[email or web address]' (procalcitonin:4,lacticidven:4)   )No results found for this or any previous visit (from the past 240 hour(s)).    Radiology Studies: No results found.      Scheduled Meds: . gabapentin  200 mg Oral 3 times per day  . LORazepam  1 mg Oral QHS  . nortriptyline  25 mg Oral QHS  . triamcinolone cream  1 application Topical q morning - 10a   Continuous Infusions: . sodium chloride       LOS: 8 days    Time spent: 25 minutes  Greater than 50% of the time spent on counseling and coordinating the care.   Leisa Lenz, MD [facility] Pager [phone]  If 7PM-7AM, please contact night-coverage [email or web address] Password [email or web address] 05/04/2017, 12:36 PM

## 2017-05-05 LAB — BASIC METABOLIC PANEL
ANION GAP: 7 (ref 5–15)
BUN: 17 mg/dL (ref 6–20)
CHLORIDE: 104 mmol/L (ref 101–111)
CO2: 28 mmol/L (ref 22–32)
Calcium: 8.5 mg/dL — ABNORMAL LOW (ref 8.9–10.3)
Creatinine, Ser: 1.1 mg/dL (ref 0.61–1.24)
GFR calc Af Amer: >60 mL/min (ref >60)
GFR calc non Af Amer: >60 mL/min (ref >60)
GLUCOSE: 98 mg/dL (ref 65–99)
POTASSIUM: 3.6 mmol/L (ref 3.5–5.1)
Sodium: 139 mmol/L (ref 135–145)

## 2017-05-05 LAB — CBC
HEMATOCRIT: 24.5 % — AB (ref 39.0–52.0)
Hemoglobin: 8.2 g/dL — ABNORMAL LOW (ref 13.0–17.0)
MCH: 29 pg (ref 26.0–34.0)
MCHC: 33.5 g/dL (ref 30.0–36.0)
MCV: 86.6 fL (ref 78.0–100.0)
Platelets: 52 10*3/uL — ABNORMAL LOW (ref 150–400)
RBC: 2.83 MIL/uL — AB (ref 4.22–5.81)
RDW: 18.1 % — AB (ref 11.5–15.5)
WBC: 1.9 10*3/uL — AB (ref 4.0–10.5)

## 2017-05-05 MED ORDER — MORPHINE SULFATE 2 MG/ML IJ SOLN
2.0000 mg | Freq: Once | INTRAMUSCULAR | Status: AC
  Administered 2017-05-05: 2 mg via INTRAVENOUS
  Filled 2017-05-05: qty 1

## 2017-05-05 MED ORDER — LORAZEPAM 2 MG/ML IJ SOLN
0.5000 mg | Freq: Once | INTRAMUSCULAR | Status: AC
  Administered 2017-05-05: 0.5 mg via INTRAVENOUS
  Filled 2017-05-05: qty 1

## 2017-05-05 NOTE — Procedures (Signed)
Procedure was discussed with the patient. Discussion included the explanation of reasons for obtaining the biopsy, conduct of the procedure, and potential complications. Patient signed informed consent to proceed with the procedure as recommended. After identifying the procedure site at the left superior particularly iliac spine, timeout was obtained location, procedure, and identity of the patient by name and date of birth. The procedure area was sterilized with solution, sterile drapes applied to protect the area. Patient has received 2 mg of IV morphine and 0.5 mg of IV lorazepam. Local anesthesia was conducted using a total of 6 mL of 1% solution of lidocaine. Initially, skin was anesthetized, subsequently surface of the bone was advised as well. Small, 3 mm incision was done with a scalpel 11 blade, subsequently, Jamshidi core needle was introduced. The initial past was successful in obtaining a small sample of the bone marrow which was submitted for flow cytometry. Second core sample was obtained for additional pathological review. Subsequently, additional past was obtained for aspiration which was submitted for additional studies. Patient had no significant discomfort during the procedure. Blood loss was minimal. Pressure dressing applied at the completion of the procedure.

## 2017-05-05 NOTE — Discharge Instructions (Signed)

## 2017-05-05 NOTE — Discharge Summary (Signed)
Physician Discharge Summary  Jordan Terry WOE:[id number] DOB: 23-Apr-1932 DOA: 04/26/2017  PCP: [name], MD  Admit date: 04/26/2017 Discharge date: 05/05/2017  Recommendations for Outpatient Follow-up:  1. Spoke with pt daughter over the phone daily. She has my cell # in case hematuria starts again. We talked about continuing foley cath and contacting GU for follow up appt. 2. Pt also has follow up in cancer center 3. Platelets ar in 50's and no hematuria this am 4. 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 He had a similar episode in the setting of thrombocytopenia a week prior requiring platelet transfusion and platelets again were found to be down to 36 on arrival. Hemoglobin had dropped to 8.6 from 9.5 as well.  Hospital course is complicated with ongoing hematuria. Pt was seen by urology, had very short course of bladder irrigation. Pt was transfused so far 3 U platelets and 3 U PRBC transfusion. He continues to have thrombocytopenia and bone marrow biopsy done 6/26 did not come with very conclusive results and needs to be repeated.   Hospital Course:  Principal Problem: Gross hematuria / Acute blood loss anemia - Thought to be from traumatic Foley insertion due to urethral stricture precipitated by thrombocytopenia - Seen by GU again, leave catheter in and follow up with GU - S/P 3 u platelets and 3 U PRBC transfusion so far during this  hospital stay  - Platelets stable at 50 range - Hgb stable   Active Problems: Thyroid lobe nodule - Outpt follow up  Pancytopenia ([facility]) - CT bone marrow bx done 6/26 - no obvious B cell lymphoproliferative disorder despite monoclonal B-cell population ; possible MDS but non-clonal caused of dysplasia must be excluded such as drug/toxin exposure, GF therapy, viral infection, nutritional deficiency (B12, copper...) - Repeated bone marrow biopsy this am, outpt follow up  History of prostate cancer - Pt will follow up with oncology   Klebsiella and pseudomonas UTI - Completed cipro for 7 days   Depression - Continue ativan and Pamelor    DVT prophylaxis: SCD  Code Status: full code  Family Communication: spoke with daughter over the phone daily and this am about d/c plan   Consultants:   Urology, Dr. Franchot Gallo  Procedures:   ECHO 7/3 - EF 50% and grade 1 DD  Bone marrow bx 7/11  Antimicrobials:   Cipro completed    Signed:  Leisa Lenz, MD  [facility] 05/05/2017, 1:20 PM  Pager #: [phone]  Time spent in minutes: more than 30 minutes   Discharge Exam: Vitals:   05/04/17 2107 05/05/17 0514  BP: 107/74 119/70  Pulse: 80 72  Resp: 18 18  Temp: 97.9 F (36.6 C) 98.3 F (36.8 C)   Vitals:   05/04/17 0513 05/04/17 1417 05/04/17 2107 05/05/17 0514  BP: 120/83 132/76 107/74 119/70  Pulse: 76 84 80 72  Resp: '18 18 18 18  ' Temp: 97.9 F (36.6 C) 98 F (36.7 C)  97.9 F (36.6 C) 98.3 F (36.8 C)  TempSrc: Oral Oral Oral Oral  SpO2: 100% 100% 98% 99%  Weight:      Height:        General: Pt is alert, follows commands appropriately, not in acute distress Cardiovascular: Regular rate and rhythm, S1/S2 + Respiratory: Clear to auscultation bilaterally, no wheezing, no crackles, no rhonchi Abdominal: Soft, non tender, non distended, bowel sounds +, no guarding Extremities: no edema, no cyanosis, pulses palpable bilaterally DP  and PT Neuro: Grossly nonfocal  Discharge Instructions  Discharge Instructions    Call MD for:  persistant nausea and vomiting    Complete by:  As directed    Call MD for:  redness, tenderness, or signs of infection (pain, swelling, redness, odor or green/yellow discharge around incision site)    Complete by:  As directed    Call MD for:  severe uncontrolled pain    Complete by:  As directed    Diet - low sodium heart healthy    Complete by:  As directed    Increase activity slowly    Complete by:  As directed      Allergies as of 05/05/2017      Reactions   Benadryl [diphenhydramine Hcl] Anxiety   MAKES HIM GO CRAZY   Hydrocodone Itching   Small amounts are okay      Medication List    STOP taking these medications   ciprofloxacin 500 MG tablet Commonly known as:  CIPRO   docusate sodium 100 MG capsule Commonly known as:  COLACE     TAKE these medications   gabapentin 100 MG capsule Commonly known as:  NEURONTIN Take 200 mg by mouth 3 (three) times daily.   LORazepam 0.5 MG tablet Commonly known as:  ATIVAN Take 1 mg by mouth at bedtime.   nortriptyline 25 MG capsule Commonly known as:  PAMELOR Take 1 capsule (25 mg total) by mouth at bedtime.   oxyCODONE-acetaminophen 7.5-325 MG tablet Commonly known as:  PERCOCET Take 0.5-1 tablets by mouth every 8 (eight) hours as needed (pain.).   polyethylene glycol packet Commonly known as:  MIRALAX / GLYCOLAX Take 17 g by mouth daily. What changed:  when to take this  reasons to take this   temazepam 15 MG capsule Commonly known as:  RESTORIL Take 15 mg by mouth at bedtime.   tetrahydrozoline-zinc 0.05-0.25 % ophthalmic solution Commonly known as:  VISINE-AC Place 1 drop into both eyes daily as needed (allergies).   traMADol 50 MG tablet Commonly known as:  ULTRAM Take 50 mg by mouth every 6 (six) hours as needed for moderate pain.   triamcinolone cream 0.1 % Commonly known as:  KENALOG Apply 1  application topically every morning. APPLY TO CHEST AND BACK EVERY MORNING FOR [place] DISEASE      Follow-up Information    [name], MD. Schedule an appointment as soon as possible for a visit.   Specialty:  Internal Medicine Contact information: [place] [address] [phone]            The results of significant diagnostics from this hospitalization (including imaging, microbiology, ancillary and laboratory) are listed below for reference.    Significant Diagnostic Studies: Ct Chest W Contrast  Result Date: 04/21/2017 CLINICAL DATA:  Non-Hodgkin's lymphoma. Hematuria and urinary retention. Constipation. Personal history of prostate carcinoma. EXAM: CT CHEST, ABDOMEN, AND PELVIS WITH CONTRAST TECHNIQUE: Multidetector CT imaging of the chest, abdomen and pelvis was performed following the  standard protocol during bolus administration of intravenous contrast. CONTRAST:  110m ISOVUE-300 IOPAMIDOL (ISOVUE-300) INJECTION 61% COMPARISON:  AP CT only on 06/09/2011 FINDINGS: CT CHEST FINDINGS Cardiovascular: No acute findings. Mediastinum/Lymph Nodes: 2.6 cm solid nodule in left thyroid lobe. No other masses or lymphadenopathy identified. Lungs/Pleura: Mild biapical and bilateral lower lobe scarring. No pulmonary infiltrate or mass identified. No effusion present. Musculoskeletal:  No suspicious bone lesions identified. CT ABDOMEN AND PELVIS FINDINGS Hepatobiliary: No masses identified. Gallbladder is unremarkable. Pancreas:  No mass or inflammatory changes. Spleen:  Within normal limits in size and appearance. Adrenals/Urinary tract: No masses or hydronephrosis. Tiny bilateral renal cysts again noted. Foley catheter seen within the bladder which is nearly completely empty. Mild diffuse bladder wall thickening and mucosal enhancement is seen, consistent with cystitis. Stomach/Bowel: No evidence of obstruction, inflammatory process, or abnormal fluid collections.  Diverticulosis is seen predominately involving the descending and sigmoid colon, however there is no evidence of diverticulitis . Normal appendix visualized. Vascular/Lymphatic: No pathologically enlarged lymph nodes identified. No abdominal aortic aneurysm. Aortic atherosclerosis. Reproductive: Normal size prostate gland with brachytherapy seeds. Unremarkable seminal vesicles. Other:  None. Musculoskeletal:  No suspicious bone lesions identified. IMPRESSION: Diffuse cystitis involving the urinary bladder. Foley catheter in place. No evidence of lymphadenopathy or metastatic disease within the chest, abdomen, or pelvis. 2.6 cm left thyroid lobe nodule. Consider thyroid ultrasound for further evaluation. This recommendation follows ACR consensus guidelines: Managing Incidental Thyroid Nodules Detected on Imaging: White Paper of the ACR Incidental Thyroid Findings Committee. J Am Coll Radiol 2015;12(2):143-150. Colonic diverticulosis. No radiographic evidence of diverticulitis. Aortic atherosclerosis. Electronically Signed   By: JEarle GellM.D.   On: 04/21/2017 07:28   Ct Abdomen Pelvis W Contrast  Result Date: 04/21/2017 CLINICAL DATA:  Non-Hodgkin's lymphoma. Hematuria and urinary retention. Constipation. Personal history of prostate carcinoma. EXAM: CT CHEST, ABDOMEN, AND PELVIS WITH CONTRAST TECHNIQUE: Multidetector CT imaging of the chest, abdomen and pelvis was performed following the standard protocol during bolus administration of intravenous contrast. CONTRAST:  1068mISOVUE-300 IOPAMIDOL (ISOVUE-300) INJECTION 61% COMPARISON:  AP CT only on 06/09/2011 FINDINGS: CT CHEST FINDINGS Cardiovascular: No acute findings. Mediastinum/Lymph Nodes: 2.6 cm solid nodule in left thyroid lobe. No other masses or lymphadenopathy identified. Lungs/Pleura: Mild biapical and bilateral lower lobe scarring. No pulmonary infiltrate or mass identified. No effusion present. Musculoskeletal:  No suspicious bone lesions  identified. CT ABDOMEN AND PELVIS FINDINGS Hepatobiliary: No masses identified. Gallbladder is unremarkable. Pancreas:  No mass or inflammatory changes. Spleen:  Within normal limits in size and appearance. Adrenals/Urinary tract: No masses or hydronephrosis. Tiny bilateral renal cysts again noted. Foley catheter seen within the bladder which is nearly completely empty. Mild diffuse bladder wall thickening and mucosal enhancement is seen, consistent with cystitis. Stomach/Bowel: No evidence of obstruction, inflammatory process, or abnormal fluid collections. Diverticulosis is seen predominately involving the descending and sigmoid colon, however there is no evidence of diverticulitis . Normal appendix visualized. Vascular/Lymphatic: No pathologically enlarged lymph nodes identified. No abdominal aortic aneurysm. Aortic atherosclerosis. Reproductive: Normal size prostate gland with brachytherapy seeds. Unremarkable seminal vesicles. Other:  None. Musculoskeletal:  No suspicious bone lesions identified. IMPRESSION: Diffuse cystitis involving the urinary bladder. Foley catheter in place. No evidence of lymphadenopathy or metastatic disease within the chest, abdomen, or pelvis. 2.6 cm left thyroid lobe nodule. Consider thyroid ultrasound for further evaluation. This recommendation follows ACR consensus guidelines: Managing Incidental Thyroid Nodules Detected on Imaging: White Paper of the ACR Incidental Thyroid Findings Committee. J Am Coll Radiol  2015;12(2):143-150. Colonic diverticulosis. No radiographic evidence of diverticulitis. Aortic atherosclerosis. Electronically Signed   By: Earle Gell M.D.   On: 04/21/2017 07:28   Ct Biopsy  Result Date: 04/21/2017 CLINICAL DATA:  Pancytopenia of uncertain etiology. EXAM: CT GUIDED DEEP ILIAC BONE ASPIRATION AND CORE BIOPSY TECHNIQUE: The procedure, risks (including but not limited to bleeding, infection, organ damage ), benefits, and alternatives were explained to the  patient. Questions regarding the procedure were encouraged and answered. The patient understands and consents to the procedure. Patient was placed supine on the CT gantry and limited axial scans through the pelvis were obtained. Appropriate skin entry site was identified. Skin site was marked, prepped with chlorhexidine, draped in usual sterile fashion, and infiltrated locally with 1% lidocaine. Intravenous Fentanyl and Versed were administered as conscious sedation during continuous monitoring of the patient's level of consciousness and physiological / cardiorespiratory status by the radiology RN, with a total moderate sedation time of 10 minutes. Under CT fluoroscopic guidance an 11-gauge Cook trocar bone needle was advanced into the right iliac bone just lateral to the sacroiliac joint. Once needle tip position was confirmed, core and aspiration samples were obtained, submitted to pathology for approval. Post procedure scans show no hematoma or fracture. Patient tolerated procedure well. COMPLICATIONS: COMPLICATIONS none IMPRESSION: 1. Technically successful CT guided right iliac bone core and aspiration biopsy. Electronically Signed   By: Lucrezia Europe M.D.   On: 04/21/2017 11:24   Ct Bone Marrow Biopsy & Aspiration  Result Date: 04/21/2017 CLINICAL DATA:  Pancytopenia of uncertain etiology. EXAM: CT GUIDED DEEP ILIAC BONE ASPIRATION AND CORE BIOPSY TECHNIQUE: The procedure, risks (including but not limited to bleeding, infection, organ damage ), benefits, and alternatives were explained to the patient. Questions regarding the procedure were encouraged and answered. The patient understands and consents to the procedure. Patient was placed supine on the CT gantry and limited axial scans through the pelvis were obtained. Appropriate skin entry site was identified. Skin site was marked, prepped with chlorhexidine, draped in usual sterile fashion, and infiltrated locally with 1% lidocaine. Intravenous Fentanyl and  Versed were administered as conscious sedation during continuous monitoring of the patient's level of consciousness and physiological / cardiorespiratory status by the radiology RN, with a total moderate sedation time of 10 minutes. Under CT fluoroscopic guidance an 11-gauge Cook trocar bone needle was advanced into the right iliac bone just lateral to the sacroiliac joint. Once needle tip position was confirmed, core and aspiration samples were obtained, submitted to pathology for approval. Post procedure scans show no hematoma or fracture. Patient tolerated procedure well. COMPLICATIONS: COMPLICATIONS none IMPRESSION: 1. Technically successful CT guided right iliac bone core and aspiration biopsy. Electronically Signed   By: Lucrezia Europe M.D.   On: 04/21/2017 11:24    Microbiology: No results found for this or any previous visit (from the past 240 hour(s)).   Labs: Basic Metabolic Panel:  Recent Labs Lab 04/30/17 0337 05/01/17 0412 05/02/17 0351 05/05/17 0337  NA 138 141 142 139  K 4.0 3.9 3.9 3.6  CL 103 104 103 104  CO2 31 31 32 28  GLUCOSE 115* 106* 103* 98  BUN 20 18 22* 17  CREATININE 1.03 1.06 0.98 1.10  CALCIUM 8.5* 8.5* 8.6* 8.5*   Liver Function Tests: No results for input(s): AST, ALT, ALKPHOS, BILITOT, PROT, ALBUMIN in the last 168 hours. No results for input(s): LIPASE, AMYLASE in the last 168 hours. No results for input(s): AMMONIA in the last 168 hours. CBC:  Recent Labs Lab 05/01/17 0412 05/02/17 0351 05/03/17 0334 05/04/17 0405 05/05/17 0337  WBC 2.3* 2.6* 2.0* 1.8* 1.9*  HGB 7.9* 8.8* 8.2* 8.1* 8.2*  HCT 23.3* 26.4* 24.5* 24.4* 24.5*  MCV 85.7 86.3 85.1 86.5 86.6  PLT 64* 63* 44* 59* 52*   Cardiac Enzymes: No results for input(s): CKTOTAL, CKMB, CKMBINDEX, TROPONINI in the last 168 hours. BNP: BNP (last 3 results) No results for input(s): BNP in the last 8760 hours.  ProBNP (last 3 results) No results for input(s): PROBNP in the last 8760  hours.  CBG: No results for input(s): GLUCAP in the last 168 hours.

## 2017-05-07 ENCOUNTER — Encounter: Payer: Self-pay | Admitting: Hematology and Oncology

## 2017-05-07 ENCOUNTER — Inpatient Hospital Stay (HOSPITAL_COMMUNITY): Admit: 2017-05-07 | Payer: Medicare Other

## 2017-05-07 ENCOUNTER — Ambulatory Visit (HOSPITAL_BASED_OUTPATIENT_CLINIC_OR_DEPARTMENT_OTHER): Payer: Medicare Other | Admitting: Hematology and Oncology

## 2017-05-07 ENCOUNTER — Other Ambulatory Visit (HOSPITAL_BASED_OUTPATIENT_CLINIC_OR_DEPARTMENT_OTHER): Payer: Medicare Other

## 2017-05-07 ENCOUNTER — Telehealth: Payer: Self-pay | Admitting: Hematology and Oncology

## 2017-05-07 VITALS — BP 120/64 | HR 83 | Temp 98.6°F | Resp 20

## 2017-05-07 DIAGNOSIS — D4620 Refractory anemia with excess of blasts, unspecified: Secondary | ICD-10-CM | POA: Diagnosis not present

## 2017-05-07 DIAGNOSIS — D469 Myelodysplastic syndrome, unspecified: Secondary | ICD-10-CM

## 2017-05-07 DIAGNOSIS — D759 Disease of blood and blood-forming organs, unspecified: Secondary | ICD-10-CM

## 2017-05-07 LAB — COMPREHENSIVE METABOLIC PANEL
ALBUMIN: 3.7 g/dL (ref 3.5–5.0)
ALK PHOS: 48 U/L (ref 40–150)
ALT: 9 U/L (ref 0–55)
AST: 15 U/L (ref 5–34)
Anion Gap: 10 mEq/L (ref 3–11)
BUN: 17.9 mg/dL (ref 7.0–26.0)
CALCIUM: 9 mg/dL (ref 8.4–10.4)
CO2: 28 mEq/L (ref 22–29)
Chloride: 105 mEq/L (ref 98–109)
Creatinine: 1.1 mg/dL (ref 0.7–1.3)
EGFR: 64 mL/min/{1.73_m2} — AB (ref >90)
Glucose: 108 mg/dl (ref 70–140)
POTASSIUM: 4 meq/L (ref 3.5–5.1)
Sodium: 142 mEq/L (ref 136–145)
Total Bilirubin: 0.36 mg/dL (ref 0.20–1.20)
Total Protein: 6.4 g/dL (ref 6.4–8.3)

## 2017-05-07 LAB — CBC & DIFF AND RETIC
BASO%: 0 % (ref 0.0–2.0)
BASOS ABS: 0 10*3/uL (ref 0.0–0.1)
EOS ABS: 0 10*3/uL (ref 0.0–0.5)
EOS%: 1.3 % (ref 0.0–7.0)
HEMATOCRIT: 24.7 % — AB (ref 38.4–49.9)
HEMOGLOBIN: 7.9 g/dL — AB (ref 13.0–17.1)
Immature Retic Fract: 10.3 % (ref 3.00–10.60)
LYMPH%: 49.7 % — AB (ref 14.0–49.0)
MCH: 28.4 pg (ref 27.2–33.4)
MCHC: 32 g/dL (ref 32.0–36.0)
MCV: 88.8 fL (ref 79.3–98.0)
MONO#: 0.1 10*3/uL (ref 0.1–0.9)
MONO%: 6.3 % (ref 0.0–14.0)
NEUT#: 0.7 10*3/uL — ABNORMAL LOW (ref 1.5–6.5)
NEUT%: 42.7 % (ref 39.0–75.0)
Platelets: 37 10*3/uL — ABNORMAL LOW (ref 140–400)
RBC: 2.78 10*6/uL — ABNORMAL LOW (ref 4.20–5.82)
RDW: 18.5 % — ABNORMAL HIGH (ref 11.0–14.6)
Retic %: 1.15 % (ref 0.80–1.80)
Retic Ct Abs: 31.97 10*3/uL — ABNORMAL LOW (ref 34.80–93.90)
WBC: 1.6 10*3/uL — AB (ref 4.0–10.3)
lymph#: 0.8 10*3/uL — ABNORMAL LOW (ref 0.9–3.3)

## 2017-05-07 LAB — TECHNOLOGIST REVIEW

## 2017-05-07 NOTE — Assessment & Plan Note (Signed)
83 year old male with new diagnosis of myelodysplastic syndrome with excess blasts. By report, blast count is approximately 8-9%. Cytogenetics are pending. Patient appears to be clinically stable. Platelet count is in the 30s active bleeding at this time. Hemoglobin slightly lower than couple of days ago down to 7.9.  Based on increasing fatigue, I have offered patient receive a unit of packed red blood cells today or tomorrow, but he has declined.  At the present time, would like to await the cytogenetics to confirm/stratify the MDS diagnosis in terms of offering patient therapy.  Plan: --No transfusion today due to patient refusal --RTC 05/11/17: labs, clinic visit with me, possible pRBC and/or Plt transfusions --Patient will keep appointment with Dr Irene Limbo scheduled on 05/17/17  Voice recognition software was used and creation of this note. Despite my best effort at editing the text, some misspelling/errors may have occurred.

## 2017-05-07 NOTE — Progress Notes (Signed)
[facility] Follow-up Visit:  Assessment: Myelodysplastic syndrome [name]) 83 year old male with new diagnosis of myelodysplastic syndrome with excess blasts. By report, blast count is approximately 8-9%. Cytogenetics are pending. Patient appears to be clinically stable. Platelet count is in the 30s active bleeding at this time. Hemoglobin slightly lower than couple of days ago down to 7.9.  Based on increasing fatigue, I have offered patient receive a unit of packed red blood cells today or tomorrow, but he has declined.  At the present time, would like to await the cytogenetics to confirm/stratify the MDS diagnosis in terms of offering patient therapy.  Plan: --No transfusion today due to patient refusal --RTC 05/11/17: labs, clinic visit with me, possible pRBC and/or Plt transfusions --Patient will keep appointment with Dr Irene Limbo scheduled on 05/17/17  Voice recognition software was used and creation of this note. Despite my best effort at editing the text, some misspelling/errors may have occurred.   Orders Placed This Encounter  Procedures  . CBC & Diff and Retic    Standing Status:   Standing    Number of Occurrences:   4    Standing Expiration Date:   05/05/2018  . Comprehensive metabolic panel    Standing Status:   Standing    Number of Occurrences:   4    Standing Expiration Date:   05/05/2018  . CBC & Diff and Retic    Standing Status:   Future    Standing Expiration Date:   05/07/2018  . Comprehensive metabolic panel    Standing Status:   Future    Standing Expiration Date:   05/07/2018  . Copper, serum    Standing Status:   Future    Standing Expiration Date:   05/07/2018  . ANA, IFA (with reflex)    Standing Status:   Future    Standing Expiration Date:   05/07/2018  . Hepatitis C antibody (reflex if positive)    Standing Status:   Future    Standing Expiration Date:   05/07/2018  . Hold Tube, Blood Bank    Standing Status:   Future    Standing  Expiration Date:   05/07/2018    Cancer Staging No matching staging information was found for the patient.  All questions were answered.  . The patient knows to call the clinic with any problems, questions or concerns.  This note was electronically signed.    History of Presenting Illness Jordan Terry 83 y.o. presenting to the [facility] for Hematological monitoring in the context of new diagnosis of myelodysplastic syndrome and possible non-Hodgkin's lymphoma discovered based on recent bone marrow biopsies.  Patient has been hospitalized until 2 days ago due to initial presentation with severe hematuria. Patient does have history of prostate cancer treated with brachytherapy. Due to persistent bleeding in the setting of noted thrombocytopenia, patient was admitted to the hospital for transfusional support. In this setting, platelets were down to 36 and hemoglobin was noted to have dropped from 9.5 down to 8.6. Received total of 3 units of packed red blood cells and 3 units of platelets during the hospital stay. Bone marrow biopsies were obtained during that period of time demonstrating presence of hypercellular bone marrow with increased blasts not reaching 20% threshold mild reticulin fibrosis and dysmagakaryopoiesis just in presence of myelodysplastic syndrome. Cytogenetics are still pending at this point in time to confirm presence of disease-defining mutations.  Since discharge from the hospital, he reports feeling reasonably well. He did not  have recurrence of severe hematuria despite self catheterizations twice daily. He appears to be able to urinate reasonably well his own as well. Denies any interval fever, chills, or night sweats. He does feel somewhat more fatigued than 2 days ago. Denies any new ecchymosis, epistaxis, gum bleeding, hematochezia, or melena.  Oncological/hematological History:  No history exists.    Medical History: Past Medical History:  Diagnosis Date  .  Anginal pain ([place]) 20 years ago  . Anxiety   . Arthritis   . Bilateral tinnitus    wears hearing aides  . Cancer [facility])    prostate  . Grover's disease   . Headache(784.0)    not since retired  . Hematuria 04/14/2017  . Self-catheterizes urinary bladder 04/19/2017  . Vertigo     Surgical History: Past Surgical History:  Procedure Laterality Date  . CARDIAC CATHETERIZATION  "20 years ago"  . CARPAL TUNNEL RELEASE Left 2008  . CATARACT EXTRACTION Bilateral 2014  . CYSTOSCOPY WITH URETHRAL DILATATION N/A 08/14/2013   Procedure: CYSTOSCOPY WITH URETHRAL DILATATION BALLOON DILATION OF URETHRAL STRICTURE;  Surgeon: Dutch Gray, MD;  Location: [facility];  Service: Urology;  Laterality: N/A;  BALLOON DILATION   . CYSTOSCOPY WITH URETHRAL DILATATION N/A 05/07/2014   Procedure: CYSTOSCOPY WITH BALLOON DILATION OF URETHRAL STRICTURE;  Surgeon: Raynelle Bring, MD;  Location: [facility];  Service: Urology;  Laterality: N/A;  [place]   "scope"  . NOSE SURGERY  1991, 1998  . RADIOACTIVE SEED IMPLANT  2002   "for prostate cancer"  . SHOULDER SURGERY  2012   right  . THYROIDECTOMY, PARTIAL  1974  . VASECTOMY  1970    Family History: Family History  Problem Relation Age of Onset  . Anxiety disorder Mother   . Cancer Father        colon, lung  . Diabetes Brother   . Cancer Brother   . Diabetes Sister   . Leukemia Sister   . Cancer Sister        breast  . Cancer Sister        breast  . Cancer Sister        thyroid  . Diabetes Sister     Social History: Social History   Social History  . Marital status: Married    Spouse name: [name]  . Number of children: 3  . Years of education: college   Occupational History  . Not on file.   Social History Main Topics  . Smoking status: Former Smoker    Years: 10.00    Types: Cigars    Quit date: 1979  . Smokeless tobacco: Never Used  . Alcohol use No  . Drug use: No  . Sexual  activity: Not on file   Other Topics Concern  . Not on file   Social History Narrative   Patient is married [name]) and lives at home with his wife.   Patient has three children.   Patient is retired.   Patient has a college education in [place].   Patient is right-handed.   Patient drinks one cup of coffee daily and one 8 oz of tea daily.    Allergies: Allergies  Allergen Reactions  . Benadryl [Diphenhydramine Hcl] Anxiety    MAKES HIM GO CRAZY  . Hydrocodone Itching    Small amounts are okay    Medications:  Current Outpatient Prescriptions  Medication Sig Dispense Refill  . gabapentin (  NEURONTIN) 100 MG capsule Take 200 mg by mouth 3 (three) times daily.     Marland Kitchen LORazepam (ATIVAN) 0.5 MG tablet Take 1 mg by mouth at bedtime.    . nortriptyline (PAMELOR) 25 MG capsule Take 1 capsule (25 mg total) by mouth at bedtime. 90 capsule 1  . oxyCODONE-acetaminophen (PERCOCET) 7.5-325 MG per tablet Take 0.5-1 tablets by mouth every 8 (eight) hours as needed (pain.).     Marland Kitchen polyethylene glycol (MIRALAX / GLYCOLAX) packet Take 17 g by mouth daily. (Patient taking differently: Take 17 g by mouth daily as needed for mild constipation or moderate constipation. ) 14 each 0  . temazepam (RESTORIL) 15 MG capsule Take 15 mg by mouth at bedtime.    Marland Kitchen tetrahydrozoline-zinc (VISINE-AC) 0.05-0.25 % ophthalmic solution Place 1 drop into both eyes daily as needed (allergies).    . traMADol (ULTRAM) 50 MG tablet Take 50 mg by mouth every 6 (six) hours as needed for moderate pain.     Marland Kitchen triamcinolone cream (KENALOG) 0.1 % Apply 1 application topically every morning. APPLY TO CHEST AND BACK EVERY MORNING FOR GROVER DISEASE     No current facility-administered medications for this visit.     Review of Systems: Review of Systems  All other systems reviewed and are negative.    PHYSICAL EXAMINATION Blood pressure 120/64, pulse 83, temperature 98.6 F (37 C), temperature source Oral, resp. rate 20,  SpO2 100 %.  ECOG PERFORMANCE STATUS: 1 - Symptomatic but completely ambulatory  Physical Exam  Constitutional: He is oriented to person, place, and time.  Thin elderly male in no apparent distress.  HENT:  Head: Normocephalic and atraumatic.  Mouth/Throat: Oropharynx is clear and moist. No oropharyngeal exudate.  Eyes: Pupils are equal, round, and reactive to light. EOM are normal. No scleral icterus.  Neck: No thyromegaly present.  Cardiovascular: Normal rate and regular rhythm.   No murmur heard. Pulmonary/Chest: Breath sounds normal. No respiratory distress. He has no wheezes.  Abdominal: Soft. He exhibits no distension and no mass. There is no tenderness.  No hepatosplenomegaly  Musculoskeletal: He exhibits no edema.  Lymphadenopathy:    He has no cervical adenopathy.  Neurological: He is alert and oriented to person, place, and time. He has normal reflexes.  Skin:  Scattered ecchymosis noted. Does not appear to be progressive compared to the last examination. No petechiae.     LABORATORY DATA: I have personally reviewed the data as listed: Appointment on 05/07/2017  Component Date Value Ref Range Status  . WBC 05/07/2017 1.6* 4.0 - 10.3 10e3/uL Final  . NEUT# 05/07/2017 0.7* 1.5 - 6.5 10e3/uL Final  . HGB 05/07/2017 7.9* 13.0 - 17.1 g/dL Final  . HCT 05/07/2017 24.7* 38.4 - 49.9 % Final  . Platelets 05/07/2017 37* 140 - 400 10e3/uL Final  . MCV 05/07/2017 88.8  79.3 - 98.0 fL Final  . MCH 05/07/2017 28.4  27.2 - 33.4 pg Final  . MCHC 05/07/2017 32.0  32.0 - 36.0 g/dL Final  . RBC 05/07/2017 2.78* 4.20 - 5.82 10e6/uL Final  . RDW 05/07/2017 18.5* 11.0 - 14.6 % Final  . lymph# 05/07/2017 0.8* 0.9 - 3.3 10e3/uL Final  . MONO# 05/07/2017 0.1  0.1 - 0.9 10e3/uL Final  . Eosinophils Absolute 05/07/2017 0.0  0.0 - 0.5 10e3/uL Final  . Basophils Absolute 05/07/2017 0.0  0.0 - 0.1 10e3/uL Final  . NEUT% 05/07/2017 42.7  39.0 - 75.0 % Final  . LYMPH% 05/07/2017 49.7* 14.0 -  49.0 %  Final  . MONO% 05/07/2017 6.3  0.0 - 14.0 % Final  . EOS% 05/07/2017 1.3  0.0 - 7.0 % Final  . BASO% 05/07/2017 0.0  0.0 - 2.0 % Final  . Retic % 05/07/2017 1.15  0.80 - 1.80 % Final  . Retic Ct Abs 05/07/2017 31.97* 34.80 - 93.90 10e3/uL Final  . Immature Retic Fract 05/07/2017 10.30  3.00 - 10.60 % Final  . Sodium 05/07/2017 142  136 - 145 mEq/L Final  . Potassium 05/07/2017 4.0  3.5 - 5.1 mEq/L Final  . Chloride 05/07/2017 105  98 - 109 mEq/L Final  . CO2 05/07/2017 28  22 - 29 mEq/L Final  . Glucose 05/07/2017 108  70 - 140 mg/dl Final   Glucose reference range is for nonfasting patients. Fasting glucose reference range is 70- 100.  Marland Kitchen BUN 05/07/2017 17.9  7.0 - 26.0 mg/dL Final  . Creatinine 05/07/2017 1.1  0.7 - 1.3 mg/dL Final  . Total Bilirubin 05/07/2017 0.36  0.20 - 1.20 mg/dL Final  . Alkaline Phosphatase 05/07/2017 48  40 - 150 U/L Final  . AST 05/07/2017 15  5 - 34 U/L Final  . ALT 05/07/2017 9  0 - 55 U/L Final  . Total Protein 05/07/2017 6.4  6.4 - 8.3 g/dL Final  . Albumin 05/07/2017 3.7  3.5 - 5.0 g/dL Final  . Calcium 05/07/2017 9.0  8.4 - 10.4 mg/dL Final  . Anion Gap 05/07/2017 10  3 - 11 mEq/L Final  . EGFR 05/07/2017 64* >90 ml/min/1.73 m2 Final   eGFR is calculated using the CKD-EPI Creatinine Equation (2009)  . Technologist Review 05/07/2017 Few Large platelets, Mod ovalos, few teardrop, schistocytes and burr cells    Final  Admission on 04/26/2017, Discharged on 05/05/2017  No results displayed because visit has over 200 results.         Ardath Sax, MD

## 2017-05-07 NOTE — Telephone Encounter (Signed)
Gave patient avs report and appointments for July. Per 7/13 los f/u w/perlov.

## 2017-05-10 ENCOUNTER — Encounter (HOSPITAL_COMMUNITY): Payer: Self-pay

## 2017-05-11 ENCOUNTER — Other Ambulatory Visit (HOSPITAL_BASED_OUTPATIENT_CLINIC_OR_DEPARTMENT_OTHER): Payer: Medicare Other

## 2017-05-11 ENCOUNTER — Encounter: Payer: Self-pay | Admitting: Hematology and Oncology

## 2017-05-11 ENCOUNTER — Telehealth: Payer: Self-pay | Admitting: Hematology and Oncology

## 2017-05-11 ENCOUNTER — Ambulatory Visit (HOSPITAL_BASED_OUTPATIENT_CLINIC_OR_DEPARTMENT_OTHER): Payer: Medicare Other | Admitting: Hematology and Oncology

## 2017-05-11 VITALS — BP 132/69 | HR 85 | Temp 98.6°F | Resp 18 | Ht 74.0 in | Wt 168.7 lb

## 2017-05-11 DIAGNOSIS — D469 Myelodysplastic syndrome, unspecified: Secondary | ICD-10-CM

## 2017-05-11 DIAGNOSIS — D4620 Refractory anemia with excess of blasts, unspecified: Secondary | ICD-10-CM

## 2017-05-11 LAB — COMPREHENSIVE METABOLIC PANEL
ALT: 15 U/L (ref 0–55)
ANION GAP: 7 meq/L (ref 3–11)
AST: 18 U/L (ref 5–34)
Albumin: 3.7 g/dL (ref 3.5–5.0)
Alkaline Phosphatase: 49 U/L (ref 40–150)
BUN: 13.8 mg/dL (ref 7.0–26.0)
CHLORIDE: 107 meq/L (ref 98–109)
CO2: 27 meq/L (ref 22–29)
Calcium: 8.9 mg/dL (ref 8.4–10.4)
Creatinine: 1 mg/dL (ref 0.7–1.3)
EGFR: 71 mL/min/{1.73_m2} — AB (ref >90)
Glucose: 109 mg/dl (ref 70–140)
POTASSIUM: 4.2 meq/L (ref 3.5–5.1)
Sodium: 141 mEq/L (ref 136–145)
Total Bilirubin: 0.37 mg/dL (ref 0.20–1.20)
Total Protein: 6.4 g/dL (ref 6.4–8.3)

## 2017-05-11 LAB — CBC & DIFF AND RETIC
BASO%: 0 % (ref 0.0–2.0)
Basophils Absolute: 0 10*3/uL (ref 0.0–0.1)
EOS%: 1.5 % (ref 0.0–7.0)
Eosinophils Absolute: 0 10*3/uL (ref 0.0–0.5)
HCT: 24.5 % — ABNORMAL LOW (ref 38.4–49.9)
HGB: 7.9 g/dL — ABNORMAL LOW (ref 13.0–17.1)
Immature Retic Fract: 12.7 % — ABNORMAL HIGH (ref 3.00–10.60)
LYMPH%: 55.5 % — AB (ref 14.0–49.0)
MCH: 28.7 pg (ref 27.2–33.4)
MCHC: 32.2 g/dL (ref 32.0–36.0)
MCV: 89.1 fL (ref 79.3–98.0)
MONO#: 0.1 10*3/uL (ref 0.1–0.9)
MONO%: 5.1 % (ref 0.0–14.0)
NEUT%: 37.9 % — ABNORMAL LOW (ref 39.0–75.0)
NEUTROS ABS: 0.5 10*3/uL — AB (ref 1.5–6.5)
PLATELETS: 27 10*3/uL — AB (ref 140–400)
RBC: 2.75 10*6/uL — AB (ref 4.20–5.82)
RDW: 19.3 % — ABNORMAL HIGH (ref 11.0–14.6)
RETIC CT ABS: 34.1 10*3/uL — AB (ref 34.80–93.90)
Retic %: 1.24 % (ref 0.80–1.80)
WBC: 1.4 10*3/uL — AB (ref 4.0–10.3)
lymph#: 0.8 10*3/uL — ABNORMAL LOW (ref 0.9–3.3)
nRBC: 0 % (ref 0–0)

## 2017-05-11 NOTE — Patient Instructions (Signed)
Thank you for choosing [facility] to provide your oncology and hematology care.  To afford each patient quality time with our providers, please arrive 30 minutes before your scheduled appointment time.  If you arrive late for your appointment, you may be asked to reschedule.  We strive to give you quality time with our providers, and arriving late affects you and other patients whose appointments are after yours.   If you are a no show for multiple scheduled visits, you may be dismissed from the clinic at the providers discretion.    Again, thank you for choosing [facility], our hope is that these requests will decrease the amount of time that you wait before being seen by our physicians.  ______________________________________________________________________  Should you have questions after your visit to the [facility], please contact our office at [phone] between the hours of 8:30 and 4:30 p.m.    Voicemails left after 4:30p.m will not be returned until the following business day.    For prescription refill requests, please have your pharmacy contact us directly.  Please also try to allow 48 hours for prescription requests.    Please contact the scheduling department for questions regarding scheduling.  For scheduling of procedures such as PET scans, CT scans, MRI, Ultrasound, etc please contact central scheduling at [phone].    Resources For Cancer Patients and Caregivers:   Oncolink.org:  A wonderful resource for patients and healthcare providers for information regarding your disease, ways to tract your treatment, what to expect, etc.     American Cancer Society:  800-227-2345  Can help patients locate various types of support and financial assistance  Cancer Care: 1-800-813-HOPE (4673) Provides financial assistance, online support groups, medication/co-pay assistance.    [place] DSS:  336-641-3447 Where to apply for food  stamps, Medicaid, and utility assistance  Medicare Rights Center: 800-333-4114 Helps people with Medicare understand their rights and benefits, navigate the Medicare system, and secure the quality healthcare they deserve  SCAT: 336-333-6589 [employer]'s shared-ride transportation service for eligible riders who have a disability that prevents them from riding the fixed route bus.    For additional information on assistance programs please contact our social worker:   Grier Hock/Abigail Elmore:  [phone]            

## 2017-05-11 NOTE — Telephone Encounter (Signed)
Scheduled appt per 7/17 los - gave patient AVS and calender pe rlos - unable to schedule treatment for 7/23 due to capped day - message sent to [facility] .

## 2017-05-11 NOTE — Assessment & Plan Note (Signed)
83 year old male with new diagnosis of myelodysplastic syndrome with excess blasts with multiple abnormalities including p53 mutation. By report, blast count is approximately 8-9%.  Patient appears to be clinically stable. Platelet count is down to 27 today and Hgb is stable at 7.9. No signs of active bleeding at this point. Patient does have increased fatigue as well as lightheadedness and dizziness, but these are likely attributable to dehydration attributed to patient being active at home doing yard work. Patient is encouraged to drink more fluids at this time.  Today, we have conducted in extensive conversation with the patient, his wife, and daughter regarding available treatment options. This included hospice referral due to underlying malignancy, supportive care with transfusions of packed red blood cells and platelets as needed, and disease-directed therapy with azacitidine or decitabine. Potential benefits and risks of each approach have been discussed with the patient. Comment patient this time was to proceed with antineoplastic therapy with either of the medications. Patient has asked to give hemoglobin with more time to consider his options.  Plan: --No transfusion today --Patient will obtain lab work on Friday. He will need a unit of red blood cells if his hemoglobin drops below or equal to 7.5, I do recommend him to receive a pack of platelets if platelet declined to 25 or lower due to previous history of bleeding. --RTC 05/17/17: labs, clinic visit with Dr Irene Limbo, possible pRBC and/or Plt transfusions and possible initiation of a azacitidine systemic therapy  Voice recognition software was used and creation of this note. Despite my best effort at editing the text, some misspelling/errors may have occurred.

## 2017-05-11 NOTE — Progress Notes (Signed)
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 By report, blast count is approximately 8-9%.  Patient appears to be clinically stable. Platelet count is down to 27 today and Hgb is stable at 7.9. No signs of active bleeding at this point. Patient does have increased fatigue as well as lightheadedness and dizziness, but these are likely attributable to dehydration attributed to patient being active at home doing yard work. Patient is encouraged to drink more fluids at this time.  Today, we have conducted in extensive conversation with the patient, his wife, and daughter regarding available treatment options. This included hospice referral due to underlying malignancy, supportive care with transfusions of packed red blood cells and platelets as needed, and disease-directed therapy with azacitidine or decitabine. Potential benefits and risks of each approach have been discussed with the patient. Comment patient this time was to proceed with antineoplastic therapy with either of the medications. Patient has asked to give hemoglobin with more time to consider his options.  Plan: --No transfusion today --Patient will obtain lab work on Friday. He will need a unit of red blood cells if his hemoglobin drops below or equal to 7.5, I do recommend him to receive a pack of platelets if platelet declined to 25 or lower due to previous history of bleeding. --RTC 05/17/17: labs, clinic visit with Dr Jordan Terry, possible pRBC and/or Plt transfusions and possible initiation of a azacitidine systemic therapy  Voice recognition software was used and creation of this note. Despite my best effort at editing the text, some misspelling/errors may have occurred.   Orders Placed This Encounter  Procedures  . Hemoglobin and Hematocrit, Blood     Standing Status:   Future    Standing Expiration Date:   05/11/2018  . Platelet count    Standing Status:   Future    Standing Expiration Date:   05/11/2018  . Hold Tube, Blood Bank    Standing Status:   Future    Standing Expiration Date:   05/11/2018    Cancer Staging No matching staging information was found for the patient.  All questions were answered.  . The patient knows to call the clinic with any problems, questions or concerns.  This note was electronically signed.    History of Presenting Illness Jordan Terry 83 y.o. followed in the [place] for Hematological monitoring in the context of new diagnosis of myelodysplastic syndrome and possible non-Hodgkin's lymphoma discovered based on recent bone marrow biopsies.  Patient presents to the clinic to continue monitoring hematological profile, discussed treatment strategies, and evaluate for possible need for transfusions. Since the last visit to the clinic, patient has been active outside, being essentially independent for his activities of daily living. He has been feeling more fatigue over the weekend as well as lightheadedness and dizziness. He had only a small amount of blood by penis overnight on one occasion, but no significant bleeding during catheterization.  Denies any interval fever, chills or night sweats. Denies increasing shortness of breath, abdominal discomfort, nausea, diarrhea, or constipation. No hematochezia or melena.  Oncological/hematological History:  No history exists.    Medical History: Past Medical History:  Diagnosis Date  . Anginal pain ([place]) 20 years ago  . Anxiety   . Arthritis   . Bilateral tinnitus    wears hearing aides  . Cancer [facility])    prostate  . Grover's  disease   . Headache(784.0)    not since retired  . Hematuria 04/14/2017  . Pancytopenia ([place]) 04/19/2017  . Self-catheterizes urinary bladder 04/19/2017  . Vertigo     Surgical History: Past Surgical History:   Procedure Laterality Date  . CARDIAC CATHETERIZATION  "20 years ago"  . CARPAL TUNNEL RELEASE Left 2008  . CATARACT EXTRACTION Bilateral 2014  . CYSTOSCOPY WITH URETHRAL DILATATION N/A 08/14/2013   Procedure: CYSTOSCOPY WITH URETHRAL DILATATION BALLOON DILATION OF URETHRAL STRICTURE;  Surgeon: Dutch Gray, MD;  Location: [facility];  Service: Urology;  Laterality: N/A;  BALLOON DILATION   . CYSTOSCOPY WITH URETHRAL DILATATION N/A 05/07/2014   Procedure: CYSTOSCOPY WITH BALLOON DILATION OF URETHRAL STRICTURE;  Surgeon: Raynelle Bring, MD;  Location: [facility];  Service: Urology;  Laterality: N/A;  [place]   "scope"  . NOSE SURGERY  1991, 1998  . RADIOACTIVE SEED IMPLANT  2002   "for prostate cancer"  . SHOULDER SURGERY  2012   right  . THYROIDECTOMY, PARTIAL  1974  . VASECTOMY  1970    Family History: Family History  Problem Relation Age of Onset  . Anxiety disorder Mother   . Cancer Father        colon, lung  . Diabetes Brother   . Cancer Brother   . Diabetes Sister   . Leukemia Sister   . Cancer Sister        breast  . Cancer Sister        breast  . Cancer Sister        thyroid  . Diabetes Sister     Social History: Social History   Social History  . Marital status: Married    Spouse name: Jordan Terry  . Number of children: 3  . Years of education: college   Occupational History  . Not on file.   Social History Main Topics  . Smoking status: Former Smoker    Years: 10.00    Types: Cigars    Quit date: 1979  . Smokeless tobacco: Never Used  . Alcohol use No  . Drug use: No  . Sexual activity: Not on file   Other Topics Concern  . Not on file   Social History Narrative   Patient is married Jordan Terry) and lives at home with his wife.   Patient has three children.   Patient is retired.   Patient has a college education in [employer].   Patient is right-handed.   Patient drinks one cup of coffee daily and one 8  oz of tea daily.    Allergies: Allergies  Allergen Reactions  . Benadryl [Diphenhydramine Hcl] Anxiety    MAKES HIM GO CRAZY  . Hydrocodone Itching    Small amounts are okay    Medications:  Current Outpatient Prescriptions  Medication Sig Dispense Refill  . gabapentin (NEURONTIN) 100 MG capsule Take 200 mg by mouth 3 (three) times daily.     Marland Kitchen LORazepam (ATIVAN) 0.5 MG tablet Take 1 mg by mouth at bedtime.    . nortriptyline (PAMELOR) 25 MG capsule Take 1 capsule (25 mg total) by mouth at bedtime. 90 capsule 1  . oxyCODONE-acetaminophen (PERCOCET) 7.5-325 MG per tablet Take 0.5-1 tablets by mouth every 8 (eight) hours as needed (pain.).     Marland Kitchen temazepam (RESTORIL) 15 MG capsule Take 15 mg by mouth at bedtime.    Marland Kitchen tetrahydrozoline-zinc (VISINE-AC) 0.05-0.25 % ophthalmic solution Place 1  drop into both eyes daily as needed (allergies).    . triamcinolone cream (KENALOG) 0.1 % Apply 1 application topically every morning. APPLY TO CHEST AND BACK EVERY MORNING FOR GROVER DISEASE     No current facility-administered medications for this visit.     Review of Systems: Review of Systems  All other systems reviewed and are negative.    PHYSICAL EXAMINATION Blood pressure 132/69, pulse 85, temperature 98.6 F (37 C), temperature source Oral, resp. rate 18, height '6\' 2"'  (1.88 m), weight 168 lb 11.2 oz (76.5 kg), SpO2 100 %.  ECOG PERFORMANCE STATUS: 1 - Symptomatic but completely ambulatory  Physical Exam  Constitutional: He is oriented to person, place, and time.  Thin elderly male in no apparent distress.  HENT:  Head: Normocephalic and atraumatic.  Mouth/Throat: Oropharynx is clear and moist. No oropharyngeal exudate.  Eyes: Pupils are equal, round, and reactive to light. EOM are normal. No scleral icterus.  Neck: No thyromegaly present.  Cardiovascular: Normal rate and regular rhythm.   No murmur heard. Pulmonary/Chest: Breath sounds normal. No respiratory distress. He has no  wheezes.  Abdominal: Soft. He exhibits no distension and no mass. There is no tenderness.  No hepatosplenomegaly  Musculoskeletal: He exhibits no edema.  Lymphadenopathy:    He has no cervical adenopathy.  Neurological: He is alert and oriented to person, place, and time. He has normal reflexes.  Skin:  Scattered ecchymosis noted. Does not appear to be progressive compared to the last examination. No petechiae.     LABORATORY DATA: I have personally reviewed the data as listed: Appointment on 05/11/2017  Component Date Value Ref Range Status  . WBC 05/11/2017 1.4* 4.0 - 10.3 10e3/uL Final  . NEUT# 05/11/2017 0.5* 1.5 - 6.5 10e3/uL Final  . HGB 05/11/2017 7.9* 13.0 - 17.1 g/dL Final  . HCT 05/11/2017 24.5* 38.4 - 49.9 % Final  . Platelets 05/11/2017 27* 140 - 400 10e3/uL Final  . MCV 05/11/2017 89.1  79.3 - 98.0 fL Final  . MCH 05/11/2017 28.7  27.2 - 33.4 pg Final  . MCHC 05/11/2017 32.2  32.0 - 36.0 g/dL Final  . RBC 05/11/2017 2.75* 4.20 - 5.82 10e6/uL Final  . RDW 05/11/2017 19.3* 11.0 - 14.6 % Final  . lymph# 05/11/2017 0.8* 0.9 - 3.3 10e3/uL Final  . MONO# 05/11/2017 0.1  0.1 - 0.9 10e3/uL Final  . Eosinophils Absolute 05/11/2017 0.0  0.0 - 0.5 10e3/uL Final  . Basophils Absolute 05/11/2017 0.0  0.0 - 0.1 10e3/uL Final  . NEUT% 05/11/2017 37.9* 39.0 - 75.0 % Final  . LYMPH% 05/11/2017 55.5* 14.0 - 49.0 % Final  . MONO% 05/11/2017 5.1  0.0 - 14.0 % Final  . EOS% 05/11/2017 1.5  0.0 - 7.0 % Final  . BASO% 05/11/2017 0.0  0.0 - 2.0 % Final  . nRBC 05/11/2017 0  0 - 0 % Final  . Retic % 05/11/2017 1.24  0.80 - 1.80 % Final  . Retic Ct Abs 05/11/2017 34.10* 34.80 - 93.90 10e3/uL Final  . Immature Retic Fract 05/11/2017 12.70* 3.00 - 10.60 % Final  . Sodium 05/11/2017 141  136 - 145 mEq/L Final  . Potassium 05/11/2017 4.2  3.5 - 5.1 mEq/L Final  . Chloride 05/11/2017 107  98 - 109 mEq/L Final  . CO2 05/11/2017 27  22 - 29 mEq/L Final  . Glucose 05/11/2017 109  70 - 140 mg/dl  Final   Glucose reference range is for nonfasting patients. Fasting glucose reference range  is 70- 100.  Marland Kitchen BUN 05/11/2017 13.8  7.0 - 26.0 mg/dL Final  . Creatinine 05/11/2017 1.0  0.7 - 1.3 mg/dL Final  . Total Bilirubin 05/11/2017 0.37  0.20 - 1.20 mg/dL Final  . Alkaline Phosphatase 05/11/2017 49  40 - 150 U/L Final  . AST 05/11/2017 18  5 - 34 U/L Final  . ALT 05/11/2017 15  0 - 55 U/L Final  . Total Protein 05/11/2017 6.4  6.4 - 8.3 g/dL Final  . Albumin 05/11/2017 3.7  3.5 - 5.0 g/dL Final  . Calcium 05/11/2017 8.9  8.4 - 10.4 mg/dL Final  . Anion Gap 05/11/2017 7  3 - 11 mEq/L Final  . EGFR 05/11/2017 71* >90 ml/min/1.73 m2 Final   eGFR is calculated using the CKD-EPI Creatinine Equation (2009)  Appointment on 05/07/2017  Component Date Value Ref Range Status  . WBC 05/07/2017 1.6* 4.0 - 10.3 10e3/uL Final  . NEUT# 05/07/2017 0.7* 1.5 - 6.5 10e3/uL Final  . HGB 05/07/2017 7.9* 13.0 - 17.1 g/dL Final  . HCT 05/07/2017 24.7* 38.4 - 49.9 % Final  . Platelets 05/07/2017 37* 140 - 400 10e3/uL Final  . MCV 05/07/2017 88.8  79.3 - 98.0 fL Final  . MCH 05/07/2017 28.4  27.2 - 33.4 pg Final  . MCHC 05/07/2017 32.0  32.0 - 36.0 g/dL Final  . RBC 05/07/2017 2.78* 4.20 - 5.82 10e6/uL Final  . RDW 05/07/2017 18.5* 11.0 - 14.6 % Final  . lymph# 05/07/2017 0.8* 0.9 - 3.3 10e3/uL Final  . MONO# 05/07/2017 0.1  0.1 - 0.9 10e3/uL Final  . Eosinophils Absolute 05/07/2017 0.0  0.0 - 0.5 10e3/uL Final  . Basophils Absolute 05/07/2017 0.0  0.0 - 0.1 10e3/uL Final  . NEUT% 05/07/2017 42.7  39.0 - 75.0 % Final  . LYMPH% 05/07/2017 49.7* 14.0 - 49.0 % Final  . MONO% 05/07/2017 6.3  0.0 - 14.0 % Final  . EOS% 05/07/2017 1.3  0.0 - 7.0 % Final  . BASO% 05/07/2017 0.0  0.0 - 2.0 % Final  . Retic % 05/07/2017 1.15  0.80 - 1.80 % Final  . Retic Ct Abs 05/07/2017 31.97* 34.80 - 93.90 10e3/uL Final  . Immature Retic Fract 05/07/2017 10.30  3.00 - 10.60 % Final  . Sodium 05/07/2017 142  136 - 145 mEq/L  Final  . Potassium 05/07/2017 4.0  3.5 - 5.1 mEq/L Final  . Chloride 05/07/2017 105  98 - 109 mEq/L Final  . CO2 05/07/2017 28  22 - 29 mEq/L Final  . Glucose 05/07/2017 108  70 - 140 mg/dl Final   Glucose reference range is for nonfasting patients. Fasting glucose reference range is 70- 100.  Marland Kitchen BUN 05/07/2017 17.9  7.0 - 26.0 mg/dL Final  . Creatinine 05/07/2017 1.1  0.7 - 1.3 mg/dL Final  . Total Bilirubin 05/07/2017 0.36  0.20 - 1.20 mg/dL Final  . Alkaline Phosphatase 05/07/2017 48  40 - 150 U/L Final  . AST 05/07/2017 15  5 - 34 U/L Final  . ALT 05/07/2017 9  0 - 55 U/L Final  . Total Protein 05/07/2017 6.4  6.4 - 8.3 g/dL Final  . Albumin 05/07/2017 3.7  3.5 - 5.0 g/dL Final  . Calcium 05/07/2017 9.0  8.4 - 10.4 mg/dL Final  . Anion Gap 05/07/2017 10  3 - 11 mEq/L Final  . EGFR 05/07/2017 64* >90 ml/min/1.73 m2 Final   eGFR is calculated using the CKD-EPI Creatinine Equation (2009)  . Technologist Review 05/07/2017 Few Large  platelets, Mod ovalos, few teardrop, schistocytes and burr cells    Final  Admission on 04/26/2017, Discharged on 05/05/2017  No results displayed because visit has over 200 results.         Ardath Sax, MD

## 2017-05-12 ENCOUNTER — Other Ambulatory Visit: Payer: Self-pay | Admitting: Hematology

## 2017-05-12 LAB — HEPATITIS C ANTIBODY (REFLEX): HCV Ab: <0.1 s/co ratio (ref 0.0–0.9)

## 2017-05-13 ENCOUNTER — Ambulatory Visit: Payer: Medicare Other | Admitting: Hematology and Oncology

## 2017-05-13 LAB — COPPER, SERUM: Copper: 95 ug/dL (ref 72–166)

## 2017-05-13 LAB — ANTINUCLEAR ANTIBODIES, IFA: ANA Titer 1: NEGATIVE

## 2017-05-13 NOTE — Progress Notes (Signed)
Pt stated he self catheterizes twice daily.

## 2017-05-14 ENCOUNTER — Ambulatory Visit (HOSPITAL_COMMUNITY)
Admission: RE | Admit: 2017-05-14 | Discharge: 2017-05-14 | Disposition: A | Discharge status: Home / Self Care | Payer: Medicare Other | Source: Ambulatory Visit | Attending: Hematology | Admitting: Hematology

## 2017-05-14 ENCOUNTER — Telehealth: Payer: Self-pay | Admitting: Hematology

## 2017-05-14 ENCOUNTER — Other Ambulatory Visit (HOSPITAL_BASED_OUTPATIENT_CLINIC_OR_DEPARTMENT_OTHER): Payer: Medicare Other

## 2017-05-14 DIAGNOSIS — D649 Anemia, unspecified: Secondary | ICD-10-CM | POA: Diagnosis present

## 2017-05-14 DIAGNOSIS — D469 Myelodysplastic syndrome, unspecified: Secondary | ICD-10-CM | POA: Diagnosis not present

## 2017-05-14 LAB — TYPE AND SCREEN
ABO/RH(D): A POS
ANTIBODY SCREEN: NEGATIVE
UNIT DIVISION: 0

## 2017-05-14 LAB — COMPREHENSIVE METABOLIC PANEL
ALBUMIN: 3.9 g/dL (ref 3.5–5.0)
ALT: 16 U/L (ref 0–55)
ANION GAP: 8 meq/L (ref 3–11)
AST: 17 U/L (ref 5–34)
Alkaline Phosphatase: 47 U/L (ref 40–150)
BUN: 12.4 mg/dL (ref 7.0–26.0)
CALCIUM: 9.2 mg/dL (ref 8.4–10.4)
CO2: 28 mEq/L (ref 22–29)
Chloride: 106 mEq/L (ref 98–109)
Creatinine: 1 mg/dL (ref 0.7–1.3)
EGFR: 68 mL/min/{1.73_m2} — AB (ref >90)
Glucose: 97 mg/dl (ref 70–140)
POTASSIUM: 4.4 meq/L (ref 3.5–5.1)
Sodium: 142 mEq/L (ref 136–145)
Total Bilirubin: 0.51 mg/dL (ref 0.20–1.20)
Total Protein: 6.8 g/dL (ref 6.4–8.3)

## 2017-05-14 LAB — BPAM RBC
BLOOD PRODUCT EXPIRATION DATE: 201808032359
UNIT TYPE AND RH: 6200

## 2017-05-14 LAB — TECHNOLOGIST REVIEW

## 2017-05-14 LAB — HEMOGLOBIN AND HEMATOCRIT, BLOOD
HCT: 25.4 % — ABNORMAL LOW (ref 38.7–49.9)
HEMOGLOBIN: 8.1 g/dL — AB (ref 13.0–17.1)

## 2017-05-14 LAB — PLATELET COUNT: PLATELETS: 22 10*3/uL — AB (ref 145–400)

## 2017-05-14 MED ORDER — SODIUM CHLORIDE 0.9 % IV SOLN: Freq: Once | INTRAVENOUS | Status: DC

## 2017-05-14 MED ORDER — SODIUM CHLORIDE 0.9% FLUSH: 10.0000 mL | INTRAVENOUS | Status: DC | PRN

## 2017-05-14 MED ORDER — SODIUM CHLORIDE 0.9 % IV SOLN
250.0000 mL | Freq: Once | INTRAVENOUS | Status: AC
  Administered 2017-05-14: 250 mL via INTRAVENOUS

## 2017-05-14 MED ORDER — SODIUM CHLORIDE 0.9% FLUSH: 3.0000 mL | INTRAVENOUS | Status: DC | PRN

## 2017-05-14 MED ORDER — HEPARIN SOD (PORK) LOCK FLUSH 100 UNIT/ML IV SOLN: 500.0000 [IU] | Freq: Every day | INTRAVENOUS | Status: DC | PRN

## 2017-05-14 MED ORDER — HEPARIN SOD (PORK) LOCK FLUSH 100 UNIT/ML IV SOLN: 250.0000 [IU] | INTRAVENOUS | Status: DC | PRN

## 2017-05-14 MED ORDER — SODIUM CHLORIDE 0.9 % IV SOLN: Freq: Once | INTRAVENOUS | Status: AC

## 2017-05-14 NOTE — Progress Notes (Signed)
Spoke with Dr. Clydene Laming nurse regarding the orders of Jordan Terry blood transfusion. His hgb is 8.1 and the order is for blood to be given if it is less than or equal to 7.5. An order was given per Dr. Lebron Conners to hold the unit of blood today and just given the pack of platelets. This was communicated to the patient and blood bank with verbal understanding from each.

## 2017-05-14 NOTE — Telephone Encounter (Signed)
Appts for treatment added - left message with appts dates and time.

## 2017-05-14 NOTE — Discharge Instructions (Signed)
Platelet Transfusion, Care After °Refer to this sheet in the next few weeks. These instructions provide you with information about caring for yourself after your procedure. Your health care provider may also give you more specific instructions. Your treatment has been planned according to current medical practices, but problems sometimes occur. Call your health care provider if you have any problems or questions after your procedure. °What can I expect after the procedure? °After the procedure, it is common to have: °· Bruising and soreness at the IV site. °· Fever or chills within the first 48 hours of your transfusion. ° °Follow these instructions at home: °· Take medicines only as directed by your health care provider. Ask your health care provider if you can take an over-the-counter pain reliever in case you have a fever or headache a day or two after your transfusion. °· Return to your normal activities as directed by your health care provider. °Contact a health care provider if: °· You have a fever. °· You have a headache. °· You have redness, swelling, or pain at your IV site. °· You have skin itching or a rash. °· You vomit. °· You feel unusually tired or weak. °Get help right away if: °· You have trouble breathing. °· You have a decreased amount of urine or you urinate less often than you normally do. °· Your urine is darker than normal. °· You have pain in your back, abdomen, or chest. °· You have cool, clammy skin. °· You have a rapid heartbeat. °This information is not intended to replace advice given to you by your health care provider. Make sure you discuss any questions you have with your health care provider. °Document Released: 11/02/2014 Document Revised: 03/19/2016 Document Reviewed: 08/22/2014 °Elsevier Interactive Patient Education © 2018 [employer] ° °

## 2017-05-14 NOTE — Procedures (Signed)
[facility]  Procedure Note  Jordan Terry MQT:[id number] DOB: 04/22/1932 DOA: 05/14/2017   Dr. Irene Limbo   Associated Diagnosis: Myelodysplastic syndrome  Procedure Note: IV started, 1 unit of platelets transfused per order, IV discontinued   Condition During Procedure:  Patient stable   Condition at Discharge: ambulatory at discharge.  Patient's daughter present for transport.   Roberto Scales, RN  [facility]

## 2017-05-15 LAB — BPAM PLATELET PHERESIS
BLOOD PRODUCT EXPIRATION DATE: 201807202359
ISSUE DATE / TIME: 201807201236
Unit Type and Rh: 6200

## 2017-05-15 LAB — PREPARE PLATELET PHERESIS: Unit division: 0

## 2017-05-17 ENCOUNTER — Ambulatory Visit (HOSPITAL_BASED_OUTPATIENT_CLINIC_OR_DEPARTMENT_OTHER): Payer: Medicare Other | Admitting: Hematology

## 2017-05-17 ENCOUNTER — Other Ambulatory Visit (HOSPITAL_BASED_OUTPATIENT_CLINIC_OR_DEPARTMENT_OTHER): Payer: Medicare Other

## 2017-05-17 ENCOUNTER — Telehealth: Payer: Self-pay

## 2017-05-17 ENCOUNTER — Ambulatory Visit (HOSPITAL_BASED_OUTPATIENT_CLINIC_OR_DEPARTMENT_OTHER): Payer: Medicare Other

## 2017-05-17 VITALS — BP 135/75 | HR 79 | Temp 98.2°F | Resp 18 | Ht 74.0 in | Wt 165.0 lb

## 2017-05-17 DIAGNOSIS — D4620 Refractory anemia with excess of blasts, unspecified: Secondary | ICD-10-CM | POA: Diagnosis not present

## 2017-05-17 DIAGNOSIS — D709 Neutropenia, unspecified: Secondary | ICD-10-CM

## 2017-05-17 DIAGNOSIS — Z5111 Encounter for antineoplastic chemotherapy: Secondary | ICD-10-CM

## 2017-05-17 DIAGNOSIS — D469 Myelodysplastic syndrome, unspecified: Secondary | ICD-10-CM

## 2017-05-17 LAB — COMPREHENSIVE METABOLIC PANEL
ALBUMIN: 4 g/dL (ref 3.5–5.0)
ALK PHOS: 45 U/L (ref 40–150)
ALT: 18 U/L (ref 0–55)
AST: 18 U/L (ref 5–34)
Anion Gap: 8 mEq/L (ref 3–11)
BUN: 13 mg/dL (ref 7.0–26.0)
CO2: 27 mEq/L (ref 22–29)
Calcium: 9.3 mg/dL (ref 8.4–10.4)
Chloride: 106 mEq/L (ref 98–109)
Creatinine: 1 mg/dL (ref 0.7–1.3)
EGFR: 72 mL/min/{1.73_m2} — ABNORMAL LOW (ref >90)
GLUCOSE: 91 mg/dL (ref 70–140)
POTASSIUM: 4.1 meq/L (ref 3.5–5.1)
SODIUM: 142 meq/L (ref 136–145)
Total Bilirubin: 0.56 mg/dL (ref 0.20–1.20)
Total Protein: 7 g/dL (ref 6.4–8.3)

## 2017-05-17 LAB — CBC & DIFF AND RETIC
BASO%: 0 % (ref 0.0–2.0)
Basophils Absolute: 0 10*3/uL (ref 0.0–0.1)
EOS ABS: 0 10*3/uL (ref 0.0–0.5)
EOS%: 1.2 % (ref 0.0–7.0)
HCT: 26 % — ABNORMAL LOW (ref 38.4–49.9)
HEMOGLOBIN: 8.4 g/dL — AB (ref 13.0–17.1)
IMMATURE RETIC FRACT: 14.7 % — AB (ref 3.00–10.60)
LYMPH#: 1 10*3/uL (ref 0.9–3.3)
LYMPH%: 59.5 % — ABNORMAL HIGH (ref 14.0–49.0)
MCH: 29 pg (ref 27.2–33.4)
MCHC: 32.3 g/dL (ref 32.0–36.0)
MCV: 89.7 fL (ref 79.3–98.0)
MONO#: 0 10*3/uL — AB (ref 0.1–0.9)
MONO%: 1.2 % (ref 0.0–14.0)
NEUT%: 38.1 % — ABNORMAL LOW (ref 39.0–75.0)
NEUTROS ABS: 0.6 10*3/uL — AB (ref 1.5–6.5)
Platelets: 34 10*3/uL — ABNORMAL LOW (ref 140–400)
RBC: 2.9 10*6/uL — ABNORMAL LOW (ref 4.20–5.82)
RDW: 19.4 % — AB (ref 11.0–14.6)
RETIC %: 1.12 % (ref 0.80–1.80)
Retic Ct Abs: 32.48 10*3/uL — ABNORMAL LOW (ref 34.80–93.90)
WBC: 1.6 10*3/uL — AB (ref 4.0–10.3)

## 2017-05-17 LAB — TECHNOLOGIST REVIEW

## 2017-05-17 MED ORDER — ACYCLOVIR 400 MG PO TABS: 400.0000 mg | ORAL_TABLET | Freq: Every day | ORAL | 2 refills | Status: DC

## 2017-05-17 MED ORDER — ONDANSETRON HCL 8 MG PO TABS: 8.0000 mg | ORAL_TABLET | Freq: Two times a day (BID) | ORAL | 1 refills | Status: AC | PRN

## 2017-05-17 MED ORDER — AZACITIDINE CHEMO SQ INJECTION
75.0000 mg/m2 | Freq: Once | INTRAMUSCULAR | Status: AC
  Administered 2017-05-17: 150 mg via SUBCUTANEOUS
  Filled 2017-05-17: qty 6

## 2017-05-17 MED ORDER — PROCHLORPERAZINE MALEATE 10 MG PO TABS: 10.0000 mg | ORAL_TABLET | Freq: Four times a day (QID) | ORAL | 1 refills | Status: DC | PRN

## 2017-05-17 MED ORDER — ONDANSETRON HCL 8 MG PO TABS
8.0000 mg | ORAL_TABLET | Freq: Once | ORAL | Status: AC
  Administered 2017-05-17: 8 mg via ORAL

## 2017-05-17 MED ORDER — ONDANSETRON HCL 8 MG PO TABS
ORAL_TABLET | ORAL | Status: AC
  Filled 2017-05-17: qty 1

## 2017-05-17 MED ORDER — FLUCONAZOLE 200 MG PO TABS: 200.0000 mg | ORAL_TABLET | Freq: Every day | ORAL | 1 refills | Status: DC

## 2017-05-17 NOTE — Progress Notes (Signed)
. .    HEMATOLOGY/ONCOLOGY CLINIC NOTE  Date of Service: .05/17/2017  Patient Care Team: [name], MD as PCP - General (Internal Medicine) Norma Fredrickson, MD as Consulting Physician (Psychiatry)  CHIEF COMPLAINTS/PURPOSE OF CONSULTATION:  pancytopenia  HISTORY OF PRESENTING ILLNESS:   Jordan Terry is a wonderful 83 y.o. male who has been referred to [place] by Dr .[name], MD/ Dr Harriett Sine MD for evaluation and management of pancytopenia.  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  He recently transferred his dermatology care to Dr. Elvera Lennox in [place] and had recent labs on 03/29/2017 which showed pancytopenia with a hemoglobin of 10.7 with an MCV of 82.5, platelet count of 45k , WBC count of 2.1k with an [place] of 924.  He reports that he has been off methotrexate for 2 months. Notes no other new medications. No recent symptoms suggestive of a viral infection. No overt fevers chills or night sweats. Does note some fatigue. Denies any previous history of liver disease. No recent infections requiring antibiotics. No new focal bone pain.   INTERVAL HISTORY  [name] is here for his scheduled f/u to discuss BM Bx results. He was seen by my partner on his last visit . He needed a rpt BM Bx due to inadequate sample the first time. 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 He was been scheduled to start [place] and completed informed consent for this. Multiple other questions were asked by the patient and his family members and were answered in details to his and his family's satisfaction.   MEDICAL HISTORY:  Past Medical History:  Diagnosis Date  . Anginal pain ([place]) 20 years ago  . Anxiety   . Arthritis   . Bilateral tinnitus    wears hearing aides  .  Cancer [facility])    prostate  . Grover's disease   . Headache(784.0)    not since retired  . Hematuria 04/14/2017  . Pancytopenia ([place]) 04/19/2017  . Self-catheterizes urinary bladder 04/19/2017  . Vertigo     SURGICAL HISTORY: Past Surgical History:  Procedure Laterality Date  . CARDIAC CATHETERIZATION  "20 years ago"  . CARPAL TUNNEL RELEASE Left 2008  . CATARACT EXTRACTION Bilateral 2014  . CYSTOSCOPY WITH URETHRAL DILATATION N/A 08/14/2013   Procedure: CYSTOSCOPY WITH URETHRAL DILATATION BALLOON DILATION OF URETHRAL STRICTURE;  Surgeon: Dutch Gray, MD;  Location: [facility];  Service: Urology;  Laterality: N/A;  BALLOON DILATION   . CYSTOSCOPY WITH URETHRAL DILATATION N/A 05/07/2014   Procedure: CYSTOSCOPY WITH BALLOON DILATION OF URETHRAL STRICTURE;  Surgeon: Raynelle Bring, MD;  Location: [facility];  Service: Urology;  Laterality: N/A;  [place]   "scope"  . NOSE SURGERY  1991, 1998  . RADIOACTIVE SEED IMPLANT  2002   "for prostate cancer"  . SHOULDER SURGERY  2012   right  . THYROIDECTOMY, PARTIAL  1974  . VASECTOMY  1970    SOCIAL HISTORY: Social History   Social History  . Marital status: Married    Spouse name: [name]  . Number of children: 3  . Years of education: college   Occupational History  . Not on file.   Social History Main Topics  . Smoking status: Former Smoker    Years: 10.00  Types: Cigars    Quit date: 50  . Smokeless tobacco: Never Used  . Alcohol use No  . Drug use: No  . Sexual activity: Not on file   Other Topics Concern  . Not on file   Social History Narrative   Patient is married [name]) and lives at home with his wife.   Patient has three children.   Patient is retired.   Patient has a college education in [place].   Patient is right-handed.   Patient drinks one cup of coffee daily and one 8 oz of tea daily.    FAMILY HISTORY: Family History  Problem Relation Age of  Onset  . Anxiety disorder Mother   . Cancer Father        colon, lung  . Diabetes Brother   . Cancer Brother   . Diabetes Sister   . Leukemia Sister   . Cancer Sister        breast  . Cancer Sister        breast  . Cancer Sister        thyroid  . Diabetes Sister     ALLERGIES:  is allergic to benadryl [diphenhydramine hcl] and hydrocodone.  MEDICATIONS:  Current Outpatient Prescriptions  Medication Sig Dispense Refill  . acyclovir (ZOVIRAX) 400 MG tablet Take 1 tablet (400 mg total) by mouth daily. 30 tablet 2  . fluconazole (DIFLUCAN) 200 MG tablet Take 1 tablet (200 mg total) by mouth daily. 30 tablet 1  . gabapentin (NEURONTIN) 100 MG capsule Take 200 mg by mouth 3 (three) times daily.     Marland Kitchen LORazepam (ATIVAN) 0.5 MG tablet Take 1 mg by mouth at bedtime.    . nortriptyline (PAMELOR) 25 MG capsule Take 1 capsule (25 mg total) by mouth at bedtime. 90 capsule 1  . ondansetron (ZOFRAN) 8 MG tablet Take 1 tablet (8 mg total) by mouth 2 (two) times daily as needed (Nausea or vomiting). 30 tablet 1  . oxyCODONE-acetaminophen (PERCOCET) 7.5-325 MG per tablet Take 0.5-1 tablets by mouth every 8 (eight) hours as needed (pain.).     Marland Kitchen prochlorperazine (COMPAZINE) 10 MG tablet Take 1 tablet (10 mg total) by mouth every 6 (six) hours as needed (Nausea or vomiting). 30 tablet 1  . temazepam (RESTORIL) 15 MG capsule Take 15 mg by mouth at bedtime.    Marland Kitchen tetrahydrozoline-zinc (VISINE-AC) 0.05-0.25 % ophthalmic solution Place 1 drop into both eyes daily as needed (allergies).    . triamcinolone cream (KENALOG) 0.1 % Apply 1 application topically every morning. APPLY TO CHEST AND BACK EVERY MORNING FOR GROVER DISEASE     No current facility-administered medications for this visit.     REVIEW OF SYSTEMS:    10 Point review of Systems was done is negative except as noted above.  PHYSICAL EXAMINATION: ECOG PERFORMANCE STATUS: 2 - Symptomatic, <50% confined to bed  . Vitals:   05/17/17 1044   BP: 135/75  Pulse: 79  Resp: 18  Temp: 98.2 F (36.8 C)   Filed Weights   05/17/17 1044  Weight: 165 lb (74.8 kg)   .Body mass index is 21.18 kg/m.  GENERAL:alert, in no acute distress and comfortable SKIN: no acute rashes, no significant lesions EYES: conjunctiva are pink and non-injected, sclera anicteric OROPHARYNX: MMM, no exudates, no oropharyngeal erythema or ulceration NECK: supple, no JVD LYMPH:  no palpable lymphadenopathy in the cervical, axillary or inguinal regions LUNGS: clear to auscultation b/l with normal respiratory effort HEART: regular rate &  rhythm  ABDOMEN:  normoactive bowel sounds , non tender, not distended. Borderline palpable spleen no overt palpable hepatomegaly. Extremity: no pedal edema PSYCH: alert & oriented x 3 with fluent speech NEURO: no focal motor/sensory deficits  LABORATORY DATA:  I have reviewed the data as listed  . CBC Latest Ref Rng & Units 05/17/2017 05/14/2017  WBC 4.0 - 10.3 10e3/uL 1.6(L) -  Hemoglobin 13.0 - 17.1 g/dL 8.4(L) 8.1(L)  Hematocrit 38.4 - 49.9 % 26.0(L) 25.4(L)  Platelets 140 - 400 10e3/uL 34(L) 22(L)    . CMP Latest Ref Rng & Units 05/17/2017 05/14/2017  Glucose 70 - 140 mg/dl 91 97  BUN 7.0 - 26.0 mg/dL 13.0 12.4  Creatinine 0.7 - 1.3 mg/dL 1.0 1.0  Sodium 136 - 145 mEq/L 142 142  Potassium 3.5 - 5.1 mEq/L 4.1 4.4  Chloride 101 - 111 mmol/L - -  CO2 22 - 29 mEq/L 27 28  Calcium 8.4 - 10.4 mg/dL 9.3 9.2  Total Protein 6.4 - 8.3 g/dL 7.0 6.8  Total Bilirubin 0.20 - 1.20 mg/dL 0.56 0.51  Alkaline Phos 40 - 150 U/L 45 47  AST 5 - 34 U/L 18 17  ALT 0 - 55 U/L 18 16   Component     Latest Ref Rng & Units 04/19/2017  WBC     4.0 - 10.3 10e3/uL 2.5 (L)  NEUT#     1.5 - 6.5 10e3/uL 1.3 (L)  Hemoglobin     13.0 - 17.1 g/dL 9.8 (L)  HCT     38.4 - 49.9 % 30.8 (L)  Platelets     140 - 400 10e3/uL 32 (L)  MCV     79.3 - 98.0 fL 86.0  MCH     27.2 - 33.4 pg 27.4  MCHC     32.0 - 36.0 g/dL 31.8 (L)   RBC     4.20 - 5.82 10e6/uL 3.58 (L)  RDW     11.0 - 14.6 % 20.6 (H)  lymph#     0.9 - 3.3 10e3/uL 1.1  MONO#     0.1 - 0.9 10e3/uL 0.1  Eosinophils Absolute     0.0 - 0.5 10e3/uL 0.1  Basophils Absolute     0.0 - 0.1 10e3/uL 0.0  NEUT%     39.0 - 75.0 % 50.1  LYMPH%     14.0 - 49.0 % 41.9  MONO%     0.0 - 14.0 % 4.0  EOS%     0.0 - 7.0 % 3.6  BASO%     0.0 - 2.0 % 0.4  Retic %     0.80 - 1.80 % 1.23  Retic Ct Abs     34.80 - 93.90 10e3/uL 44.03  Immature Retic Fract     3.00 - 10.60 % 14.10 (H)  Sodium     136 - 145 mEq/L 143  Potassium     3.5 - 5.1 mEq/L 4.0  Chloride     98 - 109 mEq/L 103  CO2     22 - 29 mEq/L 29  Glucose     70 - 140 mg/dl 93  BUN     7.0 - 26.0 mg/dL 16.2  Creatinine     0.7 - 1.3 mg/dL 1.1  Total Bilirubin     0.20 - 1.20 mg/dL 0.59  Alkaline Phosphatase     40 - 150 U/L 60  AST     5 - 34 U/L 19  ALT     0 -  55 U/L 25  Total Protein     6.4 - 8.3 g/dL 7.5  Albumin     3.5 - 5.0 g/dL 3.9  Calcium     8.4 - 10.4 mg/dL 9.5  Anion gap     3 - 11 mEq/L 11  EGFR     >90 ml/min/1.73 m2 65 (L)  LDH     125 - 245 U/L 191  Sed Rate     0 - 30 mm/hr 19  Vitamin B12     232 - 1245 pg/mL >2000 (H)  HIV     Non Reactive Non Reactive  Hep C Virus Ab     0.0 - 0.9 s/co ratio <0.1         RADIOGRAPHIC STUDIES: I have personally reviewed the radiological images as listed and agreed with the findings in the report. No results found.   CT chest/abd/pelvis 04/20/2017 IMPRESSION: Diffuse cystitis involving the urinary bladder. Foley catheter in place.  No evidence of lymphadenopathy or metastatic disease within the chest, abdomen, or pelvis.  2.6 cm left thyroid lobe nodule. Consider thyroid ultrasound for further evaluation. This recommendation follows ACR consensus guidelines: Managing Incidental Thyroid Nodules Detected on Imaging: White Paper of the ACR Incidental Thyroid Findings Committee. J Am Coll Radiol  2015;12(2):143-150.  Colonic diverticulosis. No radiographic evidence of diverticulitis.  Aortic atherosclerosis.   Electronically Signed   By: Earle Gell M.D.   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 He has been on folic acid replacement as well.  Vitamin B12 and folate levels within normal limits. HIV nonreactive Hepatitis C negative LDH within normal limits at 191  #2 thrombocytopenia platelet count of 34k #3 neutropenia WBC count of 1.6 k with an ANC of 0.6k #4 normocytic anemia hemoglobin of 8.4 with an MCV of 89 #5 atypical lymphocytes noted on peripheral blood smear - monoclonal B lymphocytosis v CT c/a/p with no evidence of lymphadenopathy/hepatosplenomegaly.   - I discussed the Bone marrow findings and cytogenetics with the patient and his family. - we discussed the diagnosis of MDS/RAEB , natural history, treatment options , clinic concerns and prognosis. -we discussed supportive treatment vs Vidaza and patient wants to proceed with Vidaza. -monitor counts. -start treatment with Vidaza as scheduled -transfuse PRBC prn for Hgb<8 or if symptomatic -platelet for plt counts <10k or if bleeding    -plz schedule Vidaza C1, C2 and C3 Weekly labs RTC with Dr Irene Limbo in 2 weeks for toxicity check   All of the patients questions were answered with apparent satisfaction. The patient knows to call the clinic with any problems, questions or concerns.  I spent 30 minutes counseling the patient face to face. The total time spent in the appointment was 40 minutes and more than 50% was on counseling and direct patient cares.    Sullivan Lone MD [place] AAHIVMS [facility] Hematology/Oncology Physician [facility]  (Office):       [phone] (Work cell):  [phone] (Fax):           [phone]

## 2017-05-17 NOTE — Telephone Encounter (Signed)
Explained purpose of two nausea medications to alternate if needed. Dr. Irene Limbo to add orders for antibiotic/antifungal. Daughter to p/u later this afternoon or tomorrow morning based on convenience. Daughter verbalized understanding.

## 2017-05-18 ENCOUNTER — Telehealth: Payer: Self-pay | Admitting: Hematology

## 2017-05-18 ENCOUNTER — Ambulatory Visit (HOSPITAL_BASED_OUTPATIENT_CLINIC_OR_DEPARTMENT_OTHER): Payer: Medicare Other

## 2017-05-18 VITALS — BP 129/70 | HR 75 | Temp 99.0°F | Resp 18

## 2017-05-18 DIAGNOSIS — D4620 Refractory anemia with excess of blasts, unspecified: Secondary | ICD-10-CM

## 2017-05-18 DIAGNOSIS — Z5111 Encounter for antineoplastic chemotherapy: Secondary | ICD-10-CM | POA: Diagnosis not present

## 2017-05-18 DIAGNOSIS — D469 Myelodysplastic syndrome, unspecified: Secondary | ICD-10-CM

## 2017-05-18 MED ORDER — AZACITIDINE CHEMO SQ INJECTION
75.0000 mg/m2 | Freq: Once | INTRAMUSCULAR | Status: AC
  Administered 2017-05-18: 150 mg via SUBCUTANEOUS
  Filled 2017-05-18: qty 4

## 2017-05-18 MED ORDER — ONDANSETRON HCL 8 MG PO TABS
8.0000 mg | ORAL_TABLET | Freq: Once | ORAL | Status: AC
Start: 2017-05-18 — End: 2017-05-18
  Administered 2017-05-18: 8 mg via ORAL

## 2017-05-18 MED ORDER — ONDANSETRON HCL 8 MG PO TABS
ORAL_TABLET | ORAL | Status: AC
  Filled 2017-05-18: qty 1

## 2017-05-18 NOTE — Telephone Encounter (Signed)
Added additional appointments for August per 7/23 los. Spoke with patient and he will get updated schedule at tomorrow's visit.

## 2017-05-18 NOTE — Patient Instructions (Signed)
[facility] Discharge Instructions for Patients Receiving Chemotherapy  Today you received the following chemotherapy agent:  Vidaza.  To help prevent nausea and vomiting after your treatment, we encourage you to take your nausea medication as prescribed.   If you develop nausea and vomiting that is not controlled by your nausea medication, call the clinic.   BELOW ARE SYMPTOMS THAT SHOULD BE REPORTED IMMEDIATELY:  *FEVER GREATER THAN 100.5 F  *CHILLS WITH OR WITHOUT FEVER  NAUSEA AND VOMITING THAT IS NOT CONTROLLED WITH YOUR NAUSEA MEDICATION  *UNUSUAL SHORTNESS OF BREATH  *UNUSUAL BRUISING OR BLEEDING  TENDERNESS IN MOUTH AND THROAT WITH OR WITHOUT PRESENCE OF ULCERS  *URINARY PROBLEMS  *BOWEL PROBLEMS  UNUSUAL RASH Items with * indicate a potential emergency and should be followed up as soon as possible.  Feel free to call the clinic you have any questions or concerns. The clinic phone number is [phone].  Please show the [place] at check-in to the Emergency Department and triage nurse.

## 2017-05-19 ENCOUNTER — Ambulatory Visit (HOSPITAL_BASED_OUTPATIENT_CLINIC_OR_DEPARTMENT_OTHER): Payer: Medicare Other

## 2017-05-19 VITALS — BP 119/69 | HR 82 | Temp 98.5°F | Resp 18

## 2017-05-19 DIAGNOSIS — D4620 Refractory anemia with excess of blasts, unspecified: Secondary | ICD-10-CM | POA: Diagnosis not present

## 2017-05-19 DIAGNOSIS — D469 Myelodysplastic syndrome, unspecified: Secondary | ICD-10-CM

## 2017-05-19 DIAGNOSIS — Z5111 Encounter for antineoplastic chemotherapy: Secondary | ICD-10-CM

## 2017-05-19 MED ORDER — AZACITIDINE CHEMO SQ INJECTION
75.0000 mg/m2 | Freq: Once | INTRAMUSCULAR | Status: AC
  Administered 2017-05-19: 150 mg via SUBCUTANEOUS
  Filled 2017-05-19: qty 6

## 2017-05-19 MED ORDER — ONDANSETRON HCL 8 MG PO TABS
8.0000 mg | ORAL_TABLET | Freq: Once | ORAL | Status: AC
  Administered 2017-05-19: 8 mg via ORAL

## 2017-05-19 MED ORDER — ONDANSETRON HCL 8 MG PO TABS
ORAL_TABLET | ORAL | Status: AC
  Filled 2017-05-19: qty 1

## 2017-05-19 NOTE — Patient Instructions (Signed)
[facility] Discharge Instructions for Patients Receiving Chemotherapy  Today you received the following chemotherapy agents Vidaza  To help prevent nausea and vomiting after your treatment, we encourage you to take your nausea medication   If you develop nausea and vomiting that is not controlled by your nausea medication, call the clinic.   BELOW ARE SYMPTOMS THAT SHOULD BE REPORTED IMMEDIATELY:  *FEVER GREATER THAN 100.5 F  *CHILLS WITH OR WITHOUT FEVER  NAUSEA AND VOMITING THAT IS NOT CONTROLLED WITH YOUR NAUSEA MEDICATION  *UNUSUAL SHORTNESS OF BREATH  *UNUSUAL BRUISING OR BLEEDING  TENDERNESS IN MOUTH AND THROAT WITH OR WITHOUT PRESENCE OF ULCERS  *URINARY PROBLEMS  *BOWEL PROBLEMS  UNUSUAL RASH Items with * indicate a potential emergency and should be followed up as soon as possible.  Feel free to call the clinic you have any questions or concerns. The clinic phone number is [phone].  Please show the CHEMO ALERT CARD at check-in to the Emergency Department and triage nurse.   

## 2017-05-20 ENCOUNTER — Ambulatory Visit (HOSPITAL_BASED_OUTPATIENT_CLINIC_OR_DEPARTMENT_OTHER): Payer: Medicare Other

## 2017-05-20 ENCOUNTER — Telehealth: Payer: Self-pay | Admitting: *Deleted

## 2017-05-20 VITALS — BP 127/75 | HR 85 | Temp 98.2°F | Resp 20

## 2017-05-20 DIAGNOSIS — Z5111 Encounter for antineoplastic chemotherapy: Secondary | ICD-10-CM | POA: Diagnosis not present

## 2017-05-20 DIAGNOSIS — D4620 Refractory anemia with excess of blasts, unspecified: Secondary | ICD-10-CM

## 2017-05-20 DIAGNOSIS — D469 Myelodysplastic syndrome, unspecified: Secondary | ICD-10-CM

## 2017-05-20 MED ORDER — ONDANSETRON HCL 8 MG PO TABS
8.0000 mg | ORAL_TABLET | Freq: Once | ORAL | Status: AC
  Administered 2017-05-20: 8 mg via ORAL

## 2017-05-20 MED ORDER — ONDANSETRON HCL 8 MG PO TABS
ORAL_TABLET | ORAL | Status: AC
  Filled 2017-05-20: qty 1

## 2017-05-20 MED ORDER — AZACITIDINE CHEMO SQ INJECTION
75.0000 mg/m2 | Freq: Once | INTRAMUSCULAR | Status: AC
  Administered 2017-05-20: 150 mg via SUBCUTANEOUS
  Filled 2017-05-20: qty 6

## 2017-05-20 NOTE — Telephone Encounter (Signed)
"  Jordan Terry, My mom received a call about an appointment next Tuesday at 9:00 am for my Dad. Could you verify or confirm?"  Reviewed scheduled appointments and cancelled.  Nothing scheduled for next Tuesday. No further questions.

## 2017-05-20 NOTE — Patient Instructions (Signed)
[facility] Discharge Instructions for Patients Receiving Chemotherapy  Today you received the following chemotherapy agents Vidaza  To help prevent nausea and vomiting after your treatment, we encourage you to take your nausea medication   If you develop nausea and vomiting that is not controlled by your nausea medication, call the clinic.   BELOW ARE SYMPTOMS THAT SHOULD BE REPORTED IMMEDIATELY:  *FEVER GREATER THAN 100.5 F  *CHILLS WITH OR WITHOUT FEVER  NAUSEA AND VOMITING THAT IS NOT CONTROLLED WITH YOUR NAUSEA MEDICATION  *UNUSUAL SHORTNESS OF BREATH  *UNUSUAL BRUISING OR BLEEDING  TENDERNESS IN MOUTH AND THROAT WITH OR WITHOUT PRESENCE OF ULCERS  *URINARY PROBLEMS  *BOWEL PROBLEMS  UNUSUAL RASH Items with * indicate a potential emergency and should be followed up as soon as possible.  Feel free to call the clinic you have any questions or concerns. The clinic phone number is [phone].  Please show the CHEMO ALERT CARD at check-in to the Emergency Department and triage nurse.   

## 2017-05-20 NOTE — Progress Notes (Signed)
OK to treat with CBC results from Monday 05/17/17.  VO  Dr. Areatha Keas RN

## 2017-05-21 ENCOUNTER — Ambulatory Visit (HOSPITAL_BASED_OUTPATIENT_CLINIC_OR_DEPARTMENT_OTHER): Payer: Medicare Other

## 2017-05-21 ENCOUNTER — Encounter (HOSPITAL_COMMUNITY): Payer: Self-pay

## 2017-05-21 VITALS — BP 134/63 | HR 92 | Temp 99.0°F | Resp 18

## 2017-05-21 DIAGNOSIS — D4620 Refractory anemia with excess of blasts, unspecified: Secondary | ICD-10-CM

## 2017-05-21 DIAGNOSIS — D469 Myelodysplastic syndrome, unspecified: Secondary | ICD-10-CM

## 2017-05-21 DIAGNOSIS — Z5111 Encounter for antineoplastic chemotherapy: Secondary | ICD-10-CM | POA: Diagnosis not present

## 2017-05-21 MED ORDER — ONDANSETRON HCL 8 MG PO TABS
ORAL_TABLET | ORAL | Status: AC
  Filled 2017-05-21: qty 1

## 2017-05-21 MED ORDER — ONDANSETRON HCL 8 MG PO TABS
8.0000 mg | ORAL_TABLET | Freq: Once | ORAL | Status: AC
  Administered 2017-05-21: 8 mg via ORAL

## 2017-05-21 MED ORDER — AZACITIDINE CHEMO SQ INJECTION
75.0000 mg/m2 | Freq: Once | INTRAMUSCULAR | Status: AC
  Administered 2017-05-21: 150 mg via SUBCUTANEOUS
  Filled 2017-05-21: qty 6

## 2017-05-21 NOTE — Patient Instructions (Signed)
[facility] Discharge Instructions for Patients Receiving Chemotherapy  Today you received the following chemotherapy agents Vidaza  To help prevent nausea and vomiting after your treatment, we encourage you to take your nausea medication   If you develop nausea and vomiting that is not controlled by your nausea medication, call the clinic.   BELOW ARE SYMPTOMS THAT SHOULD BE REPORTED IMMEDIATELY:  *FEVER GREATER THAN 100.5 F  *CHILLS WITH OR WITHOUT FEVER  NAUSEA AND VOMITING THAT IS NOT CONTROLLED WITH YOUR NAUSEA MEDICATION  *UNUSUAL SHORTNESS OF BREATH  *UNUSUAL BRUISING OR BLEEDING  TENDERNESS IN MOUTH AND THROAT WITH OR WITHOUT PRESENCE OF ULCERS  *URINARY PROBLEMS  *BOWEL PROBLEMS  UNUSUAL RASH Items with * indicate a potential emergency and should be followed up as soon as possible.  Feel free to call the clinic you have any questions or concerns. The clinic phone number is [phone].  Please show the CHEMO ALERT CARD at check-in to the Emergency Department and triage nurse.   

## 2017-05-24 ENCOUNTER — Telehealth: Payer: Self-pay

## 2017-05-24 LAB — CHROMOSOME ANALYSIS, BONE MARROW

## 2017-05-24 NOTE — Telephone Encounter (Signed)
Pt called that he has not had a BM in 5 days. Usually goes 1 x/day. Pt has used stool softener, 4 caps on separate days. He has used miralax 1 dose on 4 separate days. Instructed pt to take take 2 stool softeners and 1 miralax now and repeat in afternoon. If no results, call us in the morning.   Pt spoke back instructions.

## 2017-05-25 ENCOUNTER — Telehealth: Payer: Self-pay

## 2017-05-25 LAB — CHROMOSOME ANALYSIS, BONE MARROW

## 2017-05-25 LAB — TISSUE HYBRIDIZATION (BONE MARROW)-NCBH

## 2017-05-25 NOTE — Telephone Encounter (Signed)
Dr. Irene Limbo recommends senna s 2 tabs PO at night and magnesium citrate 136mL once daily. Wife verbalized understanding and will p/u OTC this afternoon. Pt or wife to call tomorrow to let us know if symptom has alleviated at all.

## 2017-05-25 NOTE — Telephone Encounter (Signed)
Pt encouraged to p/u senna OTC and take 1 tab this evening and 1 tab tomorrow morning. Pt to call back with results tomorrow.

## 2017-05-26 ENCOUNTER — Telehealth: Payer: Self-pay

## 2017-05-26 NOTE — Telephone Encounter (Signed)
Pt has had complete BM and feels relieved. If he notices any more constipation, Dr. Irene Limbo recommends to start with miralax for relief. Pt verbalized understanding and thanks for guidance.

## 2017-05-31 ENCOUNTER — Ambulatory Visit (HOSPITAL_BASED_OUTPATIENT_CLINIC_OR_DEPARTMENT_OTHER): Payer: Medicare Other | Admitting: Hematology

## 2017-05-31 ENCOUNTER — Telehealth: Payer: Self-pay | Admitting: Nurse Practitioner

## 2017-05-31 ENCOUNTER — Other Ambulatory Visit (HOSPITAL_BASED_OUTPATIENT_CLINIC_OR_DEPARTMENT_OTHER): Payer: Medicare Other

## 2017-05-31 ENCOUNTER — Telehealth: Payer: Self-pay | Admitting: Hematology

## 2017-05-31 ENCOUNTER — Ambulatory Visit (HOSPITAL_COMMUNITY)
Admission: RE | Admit: 2017-05-31 | Discharge: 2017-05-31 | Disposition: A | Discharge status: Home / Self Care | Payer: Medicare Other | Source: Ambulatory Visit | Attending: Hematology | Admitting: Hematology

## 2017-05-31 VITALS — BP 117/59 | HR 79 | Temp 98.3°F | Resp 18 | Ht 74.0 in | Wt 163.6 lb

## 2017-05-31 DIAGNOSIS — Z8546 Personal history of malignant neoplasm of prostate: Secondary | ICD-10-CM

## 2017-05-31 DIAGNOSIS — D4620 Refractory anemia with excess of blasts, unspecified: Secondary | ICD-10-CM | POA: Diagnosis not present

## 2017-05-31 DIAGNOSIS — D61818 Other pancytopenia: Secondary | ICD-10-CM

## 2017-05-31 DIAGNOSIS — D469 Myelodysplastic syndrome, unspecified: Secondary | ICD-10-CM

## 2017-05-31 DIAGNOSIS — D649 Anemia, unspecified: Secondary | ICD-10-CM

## 2017-05-31 DIAGNOSIS — D696 Thrombocytopenia, unspecified: Secondary | ICD-10-CM

## 2017-05-31 LAB — COMPREHENSIVE METABOLIC PANEL
ALT: 30 U/L (ref 0–55)
ANION GAP: 7 meq/L (ref 3–11)
AST: 22 U/L (ref 5–34)
Albumin: 4.1 g/dL (ref 3.5–5.0)
Alkaline Phosphatase: 54 U/L (ref 40–150)
BILIRUBIN TOTAL: 0.38 mg/dL (ref 0.20–1.20)
BUN: 17.6 mg/dL (ref 7.0–26.0)
CALCIUM: 9.6 mg/dL (ref 8.4–10.4)
CHLORIDE: 105 meq/L (ref 98–109)
CO2: 29 meq/L (ref 22–29)
Creatinine: 1.2 mg/dL (ref 0.7–1.3)
EGFR: 59 mL/min/{1.73_m2} — AB (ref >90)
Glucose: 95 mg/dl (ref 70–140)
Potassium: 4.2 mEq/L (ref 3.5–5.1)
Sodium: 141 mEq/L (ref 136–145)
Total Protein: 7.1 g/dL (ref 6.4–8.3)

## 2017-05-31 LAB — CBC & DIFF AND RETIC
BASO%: 0 % (ref 0.0–2.0)
Basophils Absolute: 0 10*3/uL (ref 0.0–0.1)
EOS%: 1.3 % (ref 0.0–7.0)
Eosinophils Absolute: 0 10*3/uL (ref 0.0–0.5)
HCT: 24.5 % — ABNORMAL LOW (ref 38.4–49.9)
HGB: 7.8 g/dL — ABNORMAL LOW (ref 13.0–17.1)
Immature Retic Fract: 12.6 % — ABNORMAL HIGH (ref 3.00–10.60)
LYMPH%: 72.1 % — AB (ref 14.0–49.0)
MCH: 28.7 pg (ref 27.2–33.4)
MCHC: 31.8 g/dL — AB (ref 32.0–36.0)
MCV: 90.1 fL (ref 79.3–98.0)
MONO#: 0.1 10*3/uL (ref 0.1–0.9)
MONO%: 3.9 % (ref 0.0–14.0)
NEUT#: 0.4 10*3/uL — CL (ref 1.5–6.5)
NEUT%: 22.7 % — AB (ref 39.0–75.0)
PLATELETS: 16 10*3/uL — AB (ref 140–400)
RBC: 2.72 10*6/uL — AB (ref 4.20–5.82)
RDW: 20.1 % — ABNORMAL HIGH (ref 11.0–14.6)
Retic %: 1.27 % (ref 0.80–1.80)
Retic Ct Abs: 34.54 10*3/uL — ABNORMAL LOW (ref 34.80–93.90)
WBC: 1.5 10*3/uL — ABNORMAL LOW (ref 4.0–10.3)
lymph#: 1.1 10*3/uL (ref 0.9–3.3)

## 2017-05-31 LAB — TECHNOLOGIST REVIEW

## 2017-05-31 NOTE — Telephone Encounter (Signed)
Noted  

## 2017-05-31 NOTE — Patient Instructions (Signed)
Thank you for choosing [facility] to provide your oncology and hematology care.  To afford each patient quality time with our providers, please arrive 30 minutes before your scheduled appointment time.  If you arrive late for your appointment, you may be asked to reschedule.  We strive to give you quality time with our providers, and arriving late affects you and other patients whose appointments are after yours.   If you are a no show for multiple scheduled visits, you may be dismissed from the clinic at the providers discretion.    Again, thank you for choosing [facility], our hope is that these requests will decrease the amount of time that you wait before being seen by our physicians.  ______________________________________________________________________  Should you have questions after your visit to the [facility], please contact our office at [phone] between the hours of 8:30 and 4:30 p.m.    Voicemails left after 4:30p.m will not be returned until the following business day.    For prescription refill requests, please have your pharmacy contact us directly.  Please also try to allow 48 hours for prescription requests.    Please contact the scheduling department for questions regarding scheduling.  For scheduling of procedures such as PET scans, CT scans, MRI, Ultrasound, etc please contact central scheduling at [phone].    Resources For Cancer Patients and Caregivers:   Oncolink.org:  A wonderful resource for patients and healthcare providers for information regarding your disease, ways to tract your treatment, what to expect, etc.     American Cancer Society:  800-227-2345  Can help patients locate various types of support and financial assistance  Cancer Care: 1-800-813-HOPE (4673) Provides financial assistance, online support groups, medication/co-pay assistance.    [place] DSS:  336-641-3447 Where to apply for food  stamps, Medicaid, and utility assistance  Medicare Rights Center: 800-333-4114 Helps people with Medicare understand their rights and benefits, navigate the Medicare system, and secure the quality healthcare they deserve  SCAT: 336-333-6589 [place] [employer]'s shared-ride transportation service for eligible riders who have a disability that prevents them from riding the fixed route bus.    For additional information on assistance programs please contact our social worker:   Grier Hock/Abigail Elmore:  [phone]            

## 2017-05-31 NOTE — Telephone Encounter (Signed)
Gave patient avs and calendar for August and September.  °

## 2017-05-31 NOTE — Telephone Encounter (Signed)
Pt's wife called the office he is taking chemo and is not up to going to appts. She wants refill for nortriptyline. I told her PCP could refill that medication since he had an upcoming appt there and if there were any problems getting a refill to call the clinic back. She was appreciative.

## 2017-05-31 NOTE — Progress Notes (Signed)
. .    HEMATOLOGY/ONCOLOGY CLINIC NOTE  Date of Service: .05/31/2017  Patient Care Team: [name], MD as PCP - General (Internal Medicine) Norma Fredrickson, MD as Consulting Physician (Psychiatry)  CHIEF COMPLAINTS/PURPOSE OF CONSULTATION:  pancytopenia  HISTORY OF PRESENTING ILLNESS:   Jordan Terry is a wonderful 83 y.o. male who has been referred to [place] by Dr .[name], MD/ Dr Harriett Sine MD for evaluation and management of pancytopenia.  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  He recently transferred his dermatology care to Dr. Elvera Lennox in [place] and had recent labs on 03/29/2017 which showed pancytopenia with a hemoglobin of 10.7 with an MCV of 82.5, platelet count of 45k , WBC count of 2.1k with an [place] of 924.  He reports that he has been off methotrexate for 2 months. Notes no other new medications. No recent symptoms suggestive of a viral infection. No overt fevers chills or night sweats. Does note some fatigue. Denies any previous history of liver disease. No recent infections requiring antibiotics. No new focal bone pain.   INTERVAL HISTORY  Mr Guerrero is here for his scheduled f/u for a toxicity check. He recently completed his 1st cycle of Vidaza and tolerated this well with no some abdominal bruising from the [place] injections. No hypersensitivity reactions. Blood counts lower Patient with some increased symptoms from his anemia and hgb is down to 7.8. Notes a spontaenous left black eye which has since resolved. No fevers/chills/night sweats/CP/SOB or other evidence of bleeding.   MEDICAL HISTORY:  Past Medical History:  Diagnosis Date  . Anginal pain ([place]) 20 years ago  . Anxiety   . Arthritis   . Bilateral tinnitus    wears hearing aides  . Cancer [facility])    prostate  . Grover's disease   . Headache(784.0)    not since retired  . Hematuria 04/14/2017  . Pancytopenia ([place]) 04/19/2017  . Self-catheterizes urinary bladder 04/19/2017  . Vertigo     SURGICAL HISTORY: Past Surgical History:  Procedure Laterality Date  . CARDIAC CATHETERIZATION  "20 years ago"  . CARPAL TUNNEL RELEASE Left 2008  . CATARACT EXTRACTION Bilateral 2014  . CYSTOSCOPY WITH URETHRAL DILATATION N/A 08/14/2013   Procedure: CYSTOSCOPY WITH URETHRAL DILATATION BALLOON DILATION OF URETHRAL STRICTURE;  Surgeon: Dutch Gray, MD;  Location: [facility];  Service: Urology;  Laterality: N/A;  BALLOON DILATION   . CYSTOSCOPY WITH URETHRAL DILATATION N/A 05/07/2014   Procedure: CYSTOSCOPY WITH BALLOON DILATION OF URETHRAL STRICTURE;  Surgeon: Raynelle Bring, MD;  Location: [facility];  Service: Urology;  Laterality: N/A;  [place]   "scope"  . NOSE SURGERY  1991, 1998  . RADIOACTIVE SEED IMPLANT  2002   "for prostate cancer"  . SHOULDER SURGERY  2012   right  . THYROIDECTOMY, PARTIAL  1974  . VASECTOMY  1970    SOCIAL HISTORY: Social History   Social History  . Marital status: Married    Spouse name: [name]  . Number of children: 3  . Years of education: college   Occupational History  . Not on file.   Social History Main Topics  . Smoking status: Former Smoker    Years: 10.00    Types: Cigars  Quit date: 37  . Smokeless tobacco: Never Used  . Alcohol use No  . Drug use: No  . Sexual activity: Not on file   Other Topics Concern  . Not on file   Social History Narrative   Patient is married [name]) and lives at home with his wife.   Patient has three children.   Patient is retired.   Patient has a college education in [place].   Patient is right-handed.   Patient drinks one cup of coffee daily and one 8 oz of tea daily.    FAMILY HISTORY: Family History    Problem Relation Age of Onset  . Anxiety disorder Mother   . Cancer Father        colon, lung  . Diabetes Brother   . Cancer Brother   . Diabetes Sister   . Leukemia Sister   . Cancer Sister        breast  . Cancer Sister        breast  . Cancer Sister        thyroid  . Diabetes Sister     ALLERGIES:  is allergic to benadryl [diphenhydramine hcl] and hydrocodone.  MEDICATIONS:  Current Outpatient Prescriptions  Medication Sig Dispense Refill  . acyclovir (ZOVIRAX) 400 MG tablet Take 1 tablet (400 mg total) by mouth daily. 30 tablet 2  . fluconazole (DIFLUCAN) 200 MG tablet Take 1 tablet (200 mg total) by mouth daily. 30 tablet 1  . gabapentin (NEURONTIN) 100 MG capsule Take 200 mg by mouth 3 (three) times daily.     Marland Kitchen LORazepam (ATIVAN) 0.5 MG tablet Take 1 mg by mouth at bedtime.    . nortriptyline (PAMELOR) 25 MG capsule Take 1 capsule (25 mg total) by mouth at bedtime. 90 capsule 1  . ondansetron (ZOFRAN) 8 MG tablet Take 1 tablet (8 mg total) by mouth 2 (two) times daily as needed (Nausea or vomiting). 30 tablet 1  . oxyCODONE-acetaminophen (PERCOCET) 7.5-325 MG per tablet Take 0.5-1 tablets by mouth every 8 (eight) hours as needed (pain.).     Marland Kitchen prochlorperazine (COMPAZINE) 10 MG tablet Take 1 tablet (10 mg total) by mouth every 6 (six) hours as needed (Nausea or vomiting). 30 tablet 1  . temazepam (RESTORIL) 15 MG capsule Take 15 mg by mouth at bedtime.    Marland Kitchen tetrahydrozoline-zinc (VISINE-AC) 0.05-0.25 % ophthalmic solution Place 1 drop into both eyes daily as needed (allergies).    . triamcinolone cream (KENALOG) 0.1 % Apply 1 application topically every morning. APPLY TO CHEST AND BACK EVERY MORNING FOR GROVER DISEASE     No current facility-administered medications for this visit.     REVIEW OF SYSTEMS:    10 Point review of Systems was done is negative except as noted above.  PHYSICAL EXAMINATION: ECOG PERFORMANCE STATUS: 2 - Symptomatic, <50% confined to  bed  . Vitals:   05/31/17 1505  BP: (!) 117/59  Pulse: 79  Resp: 18  Temp: 98.3 F (36.8 C)   Filed Weights   05/31/17 1505  Weight: 163 lb 9.6 oz (74.2 kg)   .Body mass index is 21 kg/m.  GENERAL:alert, in no acute distress and comfortable SKIN: no acute rashes, no significant lesions EYES: conjunctiva are pink and non-injected, sclera anicteric OROPHARYNX: MMM, no exudates, no oropharyngeal erythema or ulceration NECK: supple, no JVD LYMPH:  no palpable lymphadenopathy in the cervical, axillary or inguinal regions LUNGS: clear to auscultation b/l with normal respiratory effort HEART: regular rate & rhythm  ABDOMEN:  normoactive bowel sounds , non tender, not distended. Borderline palpable spleen no overt palpable hepatomegaly. Extremity: no pedal edema PSYCH: alert & oriented x 3 with fluent speech NEURO: no focal motor/sensory deficits  LABORATORY DATA:  I have reviewed the data as listed  .Marland Kitchen CBC Latest Ref Rng & Units 05/31/2017 05/17/2017 05/14/2017  WBC 4.0 - 10.3 10e3/uL 1.5(L) 1.6(L) -  Hemoglobin 13.0 - 17.1 g/dL 7.8(L) 8.4(L) 8.1(L)  Hematocrit 38.4 - 49.9 % 24.5(L) 26.0(L) 25.4(L)  Platelets 140 - 400 10e3/uL 16(L) 34(L) 22(L)   ANC400 . CMP Latest Ref Rng & Units 05/31/2017 05/17/2017 05/14/2017  Glucose 70 - 140 mg/dl 95 91 97  BUN 7.0 - 26.0 mg/dL 17.6 13.0 12.4  Creatinine 0.7 - 1.3 mg/dL 1.2 1.0 1.0  Sodium 136 - 145 mEq/L 141 142 142  Potassium 3.5 - 5.1 mEq/L 4.2 4.1 4.4  Chloride 101 - 111 mmol/L - - -  CO2 22 - 29 mEq/L 29 27 28   Calcium 8.4 - 10.4 mg/dL 9.6 9.3 9.2  Total Protein 6.4 - 8.3 g/dL 7.1 7.0 6.8  Total Bilirubin 0.20 - 1.20 mg/dL 0.38 0.56 0.51  Alkaline Phos 40 - 150 U/L 54 45 47  AST 5 - 34 U/L 22 18 17   ALT 0 - 55 U/L 30 18 16          RADIOGRAPHIC STUDIES: I have personally reviewed the radiological images as listed and agreed with the findings in the report. No results found.   CT chest/abd/pelvis  04/20/2017 IMPRESSION: Diffuse cystitis involving the urinary bladder. Foley catheter in place.  No evidence of lymphadenopathy or metastatic disease within the chest, abdomen, or pelvis.  2.6 cm left thyroid lobe nodule. Consider thyroid ultrasound for further evaluation. This recommendation follows ACR consensus guidelines: Managing Incidental Thyroid Nodules Detected on Imaging: White Paper of the ACR Incidental Thyroid Findings Committee. J Am Coll Radiol 2015;12(2):143-150.  Colonic diverticulosis. No radiographic evidence of diverticulitis.  Aortic atherosclerosis.   Electronically Signed   By: Earle Gell M.D.   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 He has been on folic acid replacement as well.  Vitamin B12 and folate levels within normal limits. HIV nonreactive Hepatitis C negative LDH within normal limits at 191  Blood counts are lower today from [place] effect  #2 thrombocytopenia platelet count of 16k -will transfuse 1 unit of platelets considering spontaneous left black eye and concerns for dysfunctional platelets in addition to thrombcytopenia.  #3 neutropenia WBC count of 1.6 k with an ANC of 0.4k -continue Acyclovir and fluconazole for prophylaxis.  #4 normocytic anemia hemoglobin of 7.8 -will schedule for 1 unit of PRBC for symptomatic anemia  #5 atypical lymphocytes noted on peripheral blood smear - monoclonal B lymphocytosis v CT c/a/p with no evidence of lymphadenopathy/hepatosplenomegaly.    Transfuse 1 unit of PRBC and 1 unit of platelets in tomorrow Labs rpt on Monday 06/07/2017 RTC with Dr Irene Limbo C2D1 with labs plz schedule C3 of treatment.    All of the patients questions  were answered with apparent satisfaction. The patient knows to call the clinic with any problems, questions or concerns.  I spent 20 minutes counseling the patient face to face. The total time spent in the appointment  was 25 minutes and more than 50% was on counseling and direct patient cares.    Sullivan Lone MD [place] AAHIVMS [facility] Hematology/Oncology Physician [facility]  (Office):       [phone] (Work cell):  [phone] (Fax):           [phone]

## 2017-06-01 ENCOUNTER — Ambulatory Visit: Payer: Medicare Other

## 2017-06-01 ENCOUNTER — Ambulatory Visit (HOSPITAL_BASED_OUTPATIENT_CLINIC_OR_DEPARTMENT_OTHER): Payer: Medicare Other

## 2017-06-01 VITALS — BP 122/68 | HR 73 | Temp 98.3°F | Resp 17

## 2017-06-01 DIAGNOSIS — D61818 Other pancytopenia: Secondary | ICD-10-CM

## 2017-06-01 DIAGNOSIS — D696 Thrombocytopenia, unspecified: Secondary | ICD-10-CM

## 2017-06-01 DIAGNOSIS — D469 Myelodysplastic syndrome, unspecified: Secondary | ICD-10-CM

## 2017-06-01 DIAGNOSIS — D4620 Refractory anemia with excess of blasts, unspecified: Secondary | ICD-10-CM | POA: Diagnosis not present

## 2017-06-01 DIAGNOSIS — D649 Anemia, unspecified: Secondary | ICD-10-CM

## 2017-06-01 LAB — TYPE AND SCREEN
ABO/RH(D): A POS
Antibody Screen: NEGATIVE
Unit division: 0

## 2017-06-01 LAB — BPAM RBC
Blood Product Expiration Date: 201808292359
Unit Type and Rh: 6200

## 2017-06-01 LAB — SAMPLE TO BLOOD BANK

## 2017-06-01 LAB — PREPARE RBC (CROSSMATCH)

## 2017-06-01 MED ORDER — ACETAMINOPHEN 325 MG PO TABS
650.0000 mg | ORAL_TABLET | Freq: Once | ORAL | Status: AC
  Administered 2017-06-01: 650 mg via ORAL

## 2017-06-01 MED ORDER — ONDANSETRON HCL 8 MG PO TABS
4.0000 mg | ORAL_TABLET | Freq: Once | ORAL | Status: AC
  Administered 2017-06-01: 4 mg via ORAL

## 2017-06-01 MED ORDER — SODIUM CHLORIDE 0.9 % IV SOLN
250.0000 mL | Freq: Once | INTRAVENOUS | Status: AC
  Administered 2017-06-01: 250 mL via INTRAVENOUS

## 2017-06-01 MED ORDER — ACETAMINOPHEN 325 MG PO TABS
ORAL_TABLET | ORAL | Status: AC
  Filled 2017-06-01: qty 2

## 2017-06-01 MED ORDER — ONDANSETRON HCL 8 MG PO TABS
ORAL_TABLET | ORAL | Status: AC
  Filled 2017-06-01: qty 1

## 2017-06-01 NOTE — Patient Instructions (Signed)
Blood Transfusion A blood transfusion is a procedure in which you are given blood through an IV tube. You may need this procedure because of:  Illness.  Surgery.  Injury.  The blood may come from someone else (a donor). You may also be able to donate blood for yourself (autologous blood donation). The blood given in a transfusion is made up of different types of cells. You may get:  Red blood cells. These carry oxygen to the cells in the body.  White blood cells. These help you fight infections.  Platelets. These help your blood to clot.  Plasma. This is the liquid part of your blood. It helps with fluid imbalances.  If you have a clotting disorder, you may also get other types of blood products. What happens before the procedure?  You will have a blood test to find out your blood type. The test also finds out what type of blood your body will accept and matches it to the donor type.  If you are going to have a planned surgery, you may be able to donate your own blood. This may be done in case you need a transfusion.  If you have had an allergic reaction to a transfusion in the past, you may be given medicine to help prevent a reaction. This medicine may be given to you by mouth or through an IV.  You will have your temperature, blood pressure, and pulse checked.  Follow instructions from your doctor about what you cannot eat or drink.  Ask your doctor about: ? Changing or stopping your regular medicines. This is important if you take diabetes medicines or blood thinners. ? Taking medicines such as aspirin and ibuprofen. These medicines can thin your blood. Do not take these medicines before your procedure if your doctor tells you not to. What happens during the procedure?  An IV tube will be put into one of your veins.  The bag of donated blood will be attached to your IV tube. Then, the blood will enter through your vein.  Your temperature, blood pressure, and pulse will be  checked regularly during the procedure. This is done to find early signs of a transfusion reaction.  If you have any signs or symptoms of a reaction, your transfusion will be stopped. You may also be given medicine.  When the transfusion is done, your IV tube will be taken out.  Pressure may be applied to the IV site for a few minutes.  A bandage (dressing) will be put on the IV site. The procedure may vary among doctors and hospitals. What happens after the procedure?  Your temperature, blood pressure, heart rate, breathing rate, and blood oxygen level will be checked often.  Your blood may be tested to see how you are responding to the transfusion.  You may be warmed with fluids or blankets. This is done to keep the temperature of your body normal. Summary  A blood transfusion is a procedure in which you are given blood through an IV tube.  The blood may come from someone else (a donor). You may also be able to donate blood for yourself.  If you have had an allergic reaction to a transfusion in the past, you may be given medicine to help prevent a reaction. This medicine may be given to you by mouth or through an IV tube.  Your temperature, blood pressure, heart rate, breathing rate, and blood oxygen level will be checked often.  Your blood may be tested to   see how you are responding to the transfusion. This information is not intended to replace advice given to you by your health care provider. Make sure you discuss any questions you have with your health care provider. Document Released: 01/08/2009 Document Revised: 06/05/2016 Document Reviewed: 06/05/2016 Elsevier Interactive Patient Education  2017 Elsevier Inc.  Platelet Transfusion A platelet transfusion is a procedure in which you receive donated platelets through an IV tube. Platelets are tiny pieces of blood cells. When a blood vessel is damaged, platelets collect in the damaged area to help form a blood clot. This begins  the healing process. If your platelet count gets too low, your blood may have trouble clotting. You may need a platelet transfusion if you have a condition that causes a low number of platelets (thrombocytopenia). A platelet transfusion may be used to stop or prevent bleeding. Tell a health care provider about:  Any allergies you have.  All medicines you are taking, including vitamins, herbs, eye drops, creams, and over-the-counter medicines.  Any problems you or family members have had with anesthetic medicines.  Any blood disorders you have.  Any surgeries you have had.  Any medical conditions you have.  Any reactions you have had during a previous transfusion. What are the risks? Generally, this is a safe procedure. However, problems may occur, including:  Fever with or without chills. The fever usually occurs within the first 4 hours of the transfusion and returns to normal within 48 hours.  Allergic reaction. The reaction is most commonly caused by antibodies your body creates against substances in the transfusion. Signs of an allergic reaction may include itching, hives, difficulty breathing, shock, or low blood pressure.  Sudden (acute) or delayed hemolytic reaction. This rare reaction can occur during the transfusion and up to 28 days after the transfusion. The reaction usually occurs when your body's defense system (immune system) attacks the new platelets. Signs of a hemolytic reaction may include fever, headache, difficulty breathing, low blood pressure, a rapid heartbeat, or pain in your back, abdomen, chest, or IV site.  Transfusion-related acute lung injury (TRALI). TRALI can occur within hours of a transfusion, or several days later. This is a rare reaction that causes lung damage. The cause is not known.  Infection. Signs of this rare complication may include fever, chills, vomiting, a rapid heartbeat, or low blood pressure.  What happens before the procedure?  You may  have a blood test to determine your blood type. This is necessary to find out what kind ofplatelets best matches your platelets.  If you have had an allergic reaction to a transfusion in the past, you may be given medicine to help prevent a reaction. Take this medicine only as directed by your health care provider.  Your temperature, blood pressure, and pulse will be monitored before the transfusion. What happens during the procedure?  An IV will be started in your hand or arm.  The transfusion will be attached to your IV tubing. The bag of donated platelets will be attached to your IV tube andgiven into your vein.  Your temperature, blood pressure, and pulse will be monitored regularly during the transfusion. This monitoring is done to help detect early signs of a transfusion reaction.  If you have any signs or symptoms of a reaction, your transfusion will be stopped and you may be given medicine.  When your transfusion is complete, your IV will be removed.  Pressure may be applied to the IV site for a few minutes.    A bandage (dressing) will be applied. The procedure may vary among health care providers and hospitals. What happens after the procedure?  Your blood pressure, temperature, and pulse will be monitored regularly. This information is not intended to replace advice given to you by your health care provider. Make sure you discuss any questions you have with your health care provider. Document Released: 08/09/2007 Document Revised: 03/19/2016 Document Reviewed: 08/22/2014 Elsevier Interactive Patient Education  2018 Elsevier Inc.  

## 2017-06-02 LAB — PREPARE PLATELET PHERESIS: Unit division: 0

## 2017-06-02 LAB — BPAM RBC
Blood Product Expiration Date: 201808292359
ISSUE DATE / TIME: 201808071421
UNIT TYPE AND RH: 6200

## 2017-06-02 LAB — TYPE AND SCREEN
ABO/RH(D): A POS
Antibody Screen: NEGATIVE
UNIT DIVISION: 0

## 2017-06-02 LAB — BPAM PLATELET PHERESIS
Blood Product Expiration Date: 201808092359
ISSUE DATE / TIME: 201808071305
UNIT TYPE AND RH: 5100

## 2017-06-07 ENCOUNTER — Telehealth: Payer: Self-pay | Admitting: Hematology

## 2017-06-07 ENCOUNTER — Ambulatory Visit: Payer: Medicare Other

## 2017-06-07 ENCOUNTER — Other Ambulatory Visit (HOSPITAL_BASED_OUTPATIENT_CLINIC_OR_DEPARTMENT_OTHER): Payer: Medicare Other

## 2017-06-07 DIAGNOSIS — D469 Myelodysplastic syndrome, unspecified: Secondary | ICD-10-CM | POA: Diagnosis not present

## 2017-06-07 LAB — CBC & DIFF AND RETIC
BASO%: 0 % (ref 0.0–2.0)
Basophils Absolute: 0 10*3/uL (ref 0.0–0.1)
EOS%: 0.6 % (ref 0.0–7.0)
Eosinophils Absolute: 0 10*3/uL (ref 0.0–0.5)
HCT: 25.7 % — ABNORMAL LOW (ref 38.4–49.9)
HGB: 8.3 g/dL — ABNORMAL LOW (ref 13.0–17.1)
Immature Retic Fract: 15.1 % — ABNORMAL HIGH (ref 3.00–10.60)
LYMPH%: 68.3 % — ABNORMAL HIGH (ref 14.0–49.0)
MCH: 29.1 pg (ref 27.2–33.4)
MCHC: 32.3 g/dL (ref 32.0–36.0)
MCV: 90.2 fL (ref 79.3–98.0)
MONO#: 0 10*3/uL — ABNORMAL LOW (ref 0.1–0.9)
MONO%: 1.8 % (ref 0.0–14.0)
NEUT%: 29.3 % — ABNORMAL LOW (ref 39.0–75.0)
NEUTROS ABS: 0.5 10*3/uL — AB (ref 1.5–6.5)
NRBC: 0 % (ref 0–0)
PLATELETS: 23 10*3/uL — AB (ref 140–400)
RBC: 2.85 10*6/uL — ABNORMAL LOW (ref 4.20–5.82)
RDW: 19.4 % — AB (ref 11.0–14.6)
Retic %: 1.27 % (ref 0.80–1.80)
Retic Ct Abs: 36.2 10*3/uL (ref 34.80–93.90)
WBC: 1.6 10*3/uL — AB (ref 4.0–10.3)
lymph#: 1.1 10*3/uL (ref 0.9–3.3)

## 2017-06-07 NOTE — Progress Notes (Signed)
Per pharmacy, patient not due to receive Vidaza injection today. Verified with Dr. Irene Limbo and he confirmed. Patient and wife informed to return next Monday for injection. Sent to scheduling to establish an infusion appointment for next week. Patient voiced understanding.   Wylene Simmer, BSN, RN 06/07/2017 3:06 PM

## 2017-06-07 NOTE — Telephone Encounter (Signed)
Spoke with patient and his wife regarding the addition of a treatment on 8/20.

## 2017-06-07 NOTE — Progress Notes (Signed)
Per Dr. Irene Limbo ok to treat with ANC 0.5, Hgb 8.3, and Plts 23.  Informed Melia, RN  In infusion.  Verified with Melia that pt has no active bleeding, and is not symptomatic from low Hgb.

## 2017-06-14 ENCOUNTER — Encounter: Payer: Self-pay | Admitting: Hematology

## 2017-06-14 ENCOUNTER — Ambulatory Visit (HOSPITAL_BASED_OUTPATIENT_CLINIC_OR_DEPARTMENT_OTHER): Payer: Medicare Other | Admitting: Hematology

## 2017-06-14 ENCOUNTER — Ambulatory Visit (HOSPITAL_BASED_OUTPATIENT_CLINIC_OR_DEPARTMENT_OTHER): Payer: Medicare Other

## 2017-06-14 ENCOUNTER — Other Ambulatory Visit: Payer: Medicare Other

## 2017-06-14 ENCOUNTER — Telehealth: Payer: Self-pay | Admitting: Hematology

## 2017-06-14 ENCOUNTER — Other Ambulatory Visit: Payer: Self-pay | Admitting: *Deleted

## 2017-06-14 ENCOUNTER — Other Ambulatory Visit (HOSPITAL_BASED_OUTPATIENT_CLINIC_OR_DEPARTMENT_OTHER): Payer: Medicare Other

## 2017-06-14 VITALS — BP 114/55 | HR 83 | Temp 98.3°F | Resp 17 | Ht 74.0 in | Wt 162.9 lb

## 2017-06-14 DIAGNOSIS — D4620 Refractory anemia with excess of blasts, unspecified: Secondary | ICD-10-CM | POA: Diagnosis not present

## 2017-06-14 DIAGNOSIS — D469 Myelodysplastic syndrome, unspecified: Secondary | ICD-10-CM

## 2017-06-14 DIAGNOSIS — Z5111 Encounter for antineoplastic chemotherapy: Secondary | ICD-10-CM

## 2017-06-14 DIAGNOSIS — D61818 Other pancytopenia: Secondary | ICD-10-CM

## 2017-06-14 LAB — COMPREHENSIVE METABOLIC PANEL
ALT: 31 U/L (ref 0–55)
AST: 25 U/L (ref 5–34)
Albumin: 3.9 g/dL (ref 3.5–5.0)
Alkaline Phosphatase: 54 U/L (ref 40–150)
Anion Gap: 7 mEq/L (ref 3–11)
BUN: 17.8 mg/dL (ref 7.0–26.0)
CALCIUM: 9.5 mg/dL (ref 8.4–10.4)
CHLORIDE: 104 meq/L (ref 98–109)
CO2: 29 meq/L (ref 22–29)
Creatinine: 1.2 mg/dL (ref 0.7–1.3)
EGFR: 56 mL/min/{1.73_m2} — ABNORMAL LOW (ref >90)
Glucose: 104 mg/dl (ref 70–140)
POTASSIUM: 4.2 meq/L (ref 3.5–5.1)
Sodium: 140 mEq/L (ref 136–145)
Total Bilirubin: 0.39 mg/dL (ref 0.20–1.20)
Total Protein: 7.3 g/dL (ref 6.4–8.3)

## 2017-06-14 LAB — CBC WITH DIFFERENTIAL/PLATELET
BASO%: 0.5 % (ref 0.0–2.0)
Basophils Absolute: 0 10*3/uL (ref 0.0–0.1)
EOS%: 1.6 % (ref 0.0–7.0)
Eosinophils Absolute: 0 10*3/uL (ref 0.0–0.5)
HEMATOCRIT: 28.3 % — AB (ref 38.4–49.9)
HGB: 9.2 g/dL — ABNORMAL LOW (ref 13.0–17.1)
LYMPH%: 63.9 % — AB (ref 14.0–49.0)
MCH: 29.3 pg (ref 27.2–33.4)
MCHC: 32.5 g/dL (ref 32.0–36.0)
MCV: 90.1 fL (ref 79.3–98.0)
MONO#: 0.1 10*3/uL (ref 0.1–0.9)
MONO%: 3.1 % (ref 0.0–14.0)
NEUT#: 0.6 10*3/uL — ABNORMAL LOW (ref 1.5–6.5)
NEUT%: 30.9 % — AB (ref 39.0–75.0)
Platelets: 32 10*3/uL — ABNORMAL LOW (ref 140–400)
RBC: 3.14 10*6/uL — AB (ref 4.20–5.82)
RDW: 19.7 % — ABNORMAL HIGH (ref 11.0–14.6)
WBC: 1.9 10*3/uL — AB (ref 4.0–10.3)
lymph#: 1.2 10*3/uL (ref 0.9–3.3)
nRBC: 0 % (ref 0–0)

## 2017-06-14 LAB — TECHNOLOGIST REVIEW

## 2017-06-14 MED ORDER — AZACITIDINE CHEMO SQ INJECTION
50.0000 mg/m2 | Freq: Once | INTRAMUSCULAR | Status: AC
  Administered 2017-06-14: 100 mg via SUBCUTANEOUS
  Filled 2017-06-14: qty 4

## 2017-06-14 MED ORDER — ONDANSETRON HCL 8 MG PO TABS
8.0000 mg | ORAL_TABLET | Freq: Once | ORAL | Status: AC
  Administered 2017-06-14: 8 mg via ORAL

## 2017-06-14 MED ORDER — ONDANSETRON HCL 8 MG PO TABS
ORAL_TABLET | ORAL | Status: AC
  Filled 2017-06-14: qty 1

## 2017-06-14 NOTE — Patient Instructions (Signed)
[facility] Discharge Instructions for Patients Receiving Chemotherapy  Today you received the following chemotherapy agents: Vidaza   To help prevent nausea and vomiting after your treatment, we encourage you to take your nausea medication as directed.    If you develop nausea and vomiting that is not controlled by your nausea medication, call the clinic.   BELOW ARE SYMPTOMS THAT SHOULD BE REPORTED IMMEDIATELY:  *FEVER GREATER THAN 100.5 F  *CHILLS WITH OR WITHOUT FEVER  NAUSEA AND VOMITING THAT IS NOT CONTROLLED WITH YOUR NAUSEA MEDICATION  *UNUSUAL SHORTNESS OF BREATH  *UNUSUAL BRUISING OR BLEEDING  TENDERNESS IN MOUTH AND THROAT WITH OR WITHOUT PRESENCE OF ULCERS  *URINARY PROBLEMS  *BOWEL PROBLEMS  UNUSUAL RASH Items with * indicate a potential emergency and should be followed up as soon as possible.  Feel free to call the clinic you have any questions or concerns. The clinic phone number is [phone].  Please show the CHEMO ALERT CARD at check-in to the Emergency Department and triage nurse.   

## 2017-06-14 NOTE — Progress Notes (Signed)
Per Dr. Irene Limbo okay to treat with ANC of 0.6 and platelets of 32.

## 2017-06-14 NOTE — Patient Instructions (Signed)
Thank you for choosing [facility] to provide your oncology and hematology care.  To afford each patient quality time with our providers, please arrive 30 minutes before your scheduled appointment time.  If you arrive late for your appointment, you may be asked to reschedule.  We strive to give you quality time with our providers, and arriving late affects you and other patients whose appointments are after yours.   If you are a no show for multiple scheduled visits, you may be dismissed from the clinic at the providers discretion.    Again, thank you for choosing [facility], our hope is that these requests will decrease the amount of time that you wait before being seen by our physicians.  ______________________________________________________________________  Should you have questions after your visit to the [facility], please contact our office at [phone] between the hours of 8:30 and 4:30 p.m.    Voicemails left after 4:30p.m will not be returned until the following business day.    For prescription refill requests, please have your pharmacy contact us directly.  Please also try to allow 48 hours for prescription requests.    Please contact the scheduling department for questions regarding scheduling.  For scheduling of procedures such as PET scans, CT scans, MRI, Ultrasound, etc please contact central scheduling at [phone].    Resources For Cancer Patients and Caregivers:   Oncolink.org:  A wonderful resource for patients and healthcare providers for information regarding your disease, ways to tract your treatment, what to expect, etc.     American Cancer Society:  800-227-2345  Can help patients locate various types of support and financial assistance  Cancer Care: 1-800-813-HOPE (4673) Provides financial assistance, online support groups, medication/co-pay assistance.    [place] DSS:  336-641-3447 Where to apply for food  stamps, Medicaid, and utility assistance  Medicare Rights Center: 800-333-4114 Helps people with Medicare understand their rights and benefits, navigate the Medicare system, and secure the quality healthcare they deserve  SCAT: 336-333-6589 [place] [employer]'s shared-ride transportation service for eligible riders who have a disability that prevents them from riding the fixed route bus.    For additional information on assistance programs please contact our social worker:   Grier Hock/Abigail Elmore:  [phone]            

## 2017-06-14 NOTE — Telephone Encounter (Signed)
Scheduled appt per 8/20 los - patient is aware and will pick up new schedule tomorrow

## 2017-06-15 ENCOUNTER — Ambulatory Visit (HOSPITAL_BASED_OUTPATIENT_CLINIC_OR_DEPARTMENT_OTHER): Payer: Medicare Other

## 2017-06-15 ENCOUNTER — Telehealth: Payer: Self-pay | Admitting: Hematology

## 2017-06-15 VITALS — BP 127/82 | HR 82 | Temp 98.5°F | Resp 18

## 2017-06-15 DIAGNOSIS — Z5111 Encounter for antineoplastic chemotherapy: Secondary | ICD-10-CM | POA: Diagnosis not present

## 2017-06-15 DIAGNOSIS — D4620 Refractory anemia with excess of blasts, unspecified: Secondary | ICD-10-CM | POA: Diagnosis not present

## 2017-06-15 DIAGNOSIS — D469 Myelodysplastic syndrome, unspecified: Secondary | ICD-10-CM

## 2017-06-15 MED ORDER — ONDANSETRON HCL 8 MG PO TABS
ORAL_TABLET | ORAL | Status: AC
  Filled 2017-06-15: qty 1

## 2017-06-15 MED ORDER — ONDANSETRON HCL 8 MG PO TABS
8.0000 mg | ORAL_TABLET | Freq: Once | ORAL | Status: AC
  Administered 2017-06-15: 8 mg via ORAL

## 2017-06-15 MED ORDER — AZACITIDINE CHEMO SQ INJECTION
50.0000 mg/m2 | Freq: Once | INTRAMUSCULAR | Status: AC
  Administered 2017-06-15: 100 mg via SUBCUTANEOUS
  Filled 2017-06-15: qty 4

## 2017-06-15 NOTE — Telephone Encounter (Signed)
Gave pt an updated schedule and calendar.

## 2017-06-16 ENCOUNTER — Ambulatory Visit (HOSPITAL_BASED_OUTPATIENT_CLINIC_OR_DEPARTMENT_OTHER): Payer: Medicare Other

## 2017-06-16 VITALS — BP 128/66 | HR 92 | Temp 98.9°F | Resp 18

## 2017-06-16 DIAGNOSIS — Z5111 Encounter for antineoplastic chemotherapy: Secondary | ICD-10-CM | POA: Diagnosis not present

## 2017-06-16 DIAGNOSIS — D4620 Refractory anemia with excess of blasts, unspecified: Secondary | ICD-10-CM

## 2017-06-16 DIAGNOSIS — D469 Myelodysplastic syndrome, unspecified: Secondary | ICD-10-CM

## 2017-06-16 MED ORDER — AZACITIDINE CHEMO SQ INJECTION
50.0000 mg/m2 | Freq: Once | INTRAMUSCULAR | Status: AC
  Administered 2017-06-16: 100 mg via SUBCUTANEOUS
  Filled 2017-06-16: qty 4

## 2017-06-16 MED ORDER — ONDANSETRON HCL 8 MG PO TABS
8.0000 mg | ORAL_TABLET | Freq: Once | ORAL | Status: AC
  Administered 2017-06-16: 8 mg via ORAL

## 2017-06-16 MED ORDER — ONDANSETRON HCL 8 MG PO TABS
ORAL_TABLET | ORAL | Status: AC
  Filled 2017-06-16: qty 1

## 2017-06-16 NOTE — Patient Instructions (Signed)
[facility] Discharge Instructions for Patients Receiving Chemotherapy  Today you received the following chemotherapy agents Vidaza  To help prevent nausea and vomiting after your treatment, we encourage you to take your nausea medication as prescribed.    If you develop nausea and vomiting that is not controlled by your nausea medication, call the clinic.   BELOW ARE SYMPTOMS THAT SHOULD BE REPORTED IMMEDIATELY:  *FEVER GREATER THAN 100.5 F  *CHILLS WITH OR WITHOUT FEVER  NAUSEA AND VOMITING THAT IS NOT CONTROLLED WITH YOUR NAUSEA MEDICATION  *UNUSUAL SHORTNESS OF BREATH  *UNUSUAL BRUISING OR BLEEDING  TENDERNESS IN MOUTH AND THROAT WITH OR WITHOUT PRESENCE OF ULCERS  *URINARY PROBLEMS  *BOWEL PROBLEMS  UNUSUAL RASH Items with * indicate a potential emergency and should be followed up as soon as possible.  Feel free to call the clinic you have any questions or concerns. The clinic phone number is [phone].  Please show the CHEMO ALERT CARD at check-in to the Emergency Department and triage nurse.   

## 2017-06-17 ENCOUNTER — Ambulatory Visit (HOSPITAL_BASED_OUTPATIENT_CLINIC_OR_DEPARTMENT_OTHER): Payer: Medicare Other

## 2017-06-17 VITALS — BP 138/87 | HR 77 | Temp 99.0°F | Resp 18

## 2017-06-17 DIAGNOSIS — D469 Myelodysplastic syndrome, unspecified: Secondary | ICD-10-CM

## 2017-06-17 DIAGNOSIS — Z5111 Encounter for antineoplastic chemotherapy: Secondary | ICD-10-CM

## 2017-06-17 DIAGNOSIS — D4620 Refractory anemia with excess of blasts, unspecified: Secondary | ICD-10-CM | POA: Diagnosis not present

## 2017-06-17 MED ORDER — ONDANSETRON HCL 8 MG PO TABS
ORAL_TABLET | ORAL | Status: AC
  Filled 2017-06-17: qty 1

## 2017-06-17 MED ORDER — ONDANSETRON HCL 8 MG PO TABS
8.0000 mg | ORAL_TABLET | Freq: Once | ORAL | Status: AC
  Administered 2017-06-17: 8 mg via ORAL

## 2017-06-17 MED ORDER — AZACITIDINE CHEMO SQ INJECTION
50.0000 mg/m2 | Freq: Once | INTRAMUSCULAR | Status: AC
  Administered 2017-06-17: 100 mg via SUBCUTANEOUS
  Filled 2017-06-17: qty 4

## 2017-06-17 NOTE — Patient Instructions (Signed)
[facility] Discharge Instructions for Patients Receiving Chemotherapy  Today you received the following chemotherapy agents Vidaza  To help prevent nausea and vomiting after your treatment, we encourage you to take your nausea medication as prescribed.    If you develop nausea and vomiting that is not controlled by your nausea medication, call the clinic.   BELOW ARE SYMPTOMS THAT SHOULD BE REPORTED IMMEDIATELY:  *FEVER GREATER THAN 100.5 F  *CHILLS WITH OR WITHOUT FEVER  NAUSEA AND VOMITING THAT IS NOT CONTROLLED WITH YOUR NAUSEA MEDICATION  *UNUSUAL SHORTNESS OF BREATH  *UNUSUAL BRUISING OR BLEEDING  TENDERNESS IN MOUTH AND THROAT WITH OR WITHOUT PRESENCE OF ULCERS  *URINARY PROBLEMS  *BOWEL PROBLEMS  UNUSUAL RASH Items with * indicate a potential emergency and should be followed up as soon as possible.  Feel free to call the clinic you have any questions or concerns. The clinic phone number is [phone].  Please show the CHEMO ALERT CARD at check-in to the Emergency Department and triage nurse.   

## 2017-06-18 ENCOUNTER — Ambulatory Visit (HOSPITAL_BASED_OUTPATIENT_CLINIC_OR_DEPARTMENT_OTHER): Payer: Medicare Other

## 2017-06-18 VITALS — BP 122/60 | HR 61 | Temp 98.5°F | Resp 18

## 2017-06-18 DIAGNOSIS — D4620 Refractory anemia with excess of blasts, unspecified: Secondary | ICD-10-CM | POA: Diagnosis not present

## 2017-06-18 DIAGNOSIS — Z5111 Encounter for antineoplastic chemotherapy: Secondary | ICD-10-CM

## 2017-06-18 DIAGNOSIS — D469 Myelodysplastic syndrome, unspecified: Secondary | ICD-10-CM

## 2017-06-18 MED ORDER — ONDANSETRON HCL 8 MG PO TABS
ORAL_TABLET | ORAL | Status: AC
  Filled 2017-06-18: qty 1

## 2017-06-18 MED ORDER — ONDANSETRON HCL 8 MG PO TABS
8.0000 mg | ORAL_TABLET | Freq: Once | ORAL | Status: AC
  Administered 2017-06-18: 8 mg via ORAL

## 2017-06-18 MED ORDER — AZACITIDINE CHEMO SQ INJECTION
50.0000 mg/m2 | Freq: Once | INTRAMUSCULAR | Status: AC
  Administered 2017-06-18: 100 mg via SUBCUTANEOUS
  Filled 2017-06-18: qty 4

## 2017-06-18 NOTE — Patient Instructions (Signed)
[facility] Discharge Instructions for Patients Receiving Chemotherapy  Today you received the following chemotherapy agents: Vidaza   To help prevent nausea and vomiting after your treatment, we encourage you to take your nausea medication as directed.    If you develop nausea and vomiting that is not controlled by your nausea medication, call the clinic.   BELOW ARE SYMPTOMS THAT SHOULD BE REPORTED IMMEDIATELY:  *FEVER GREATER THAN 100.5 F  *CHILLS WITH OR WITHOUT FEVER  NAUSEA AND VOMITING THAT IS NOT CONTROLLED WITH YOUR NAUSEA MEDICATION  *UNUSUAL SHORTNESS OF BREATH  *UNUSUAL BRUISING OR BLEEDING  TENDERNESS IN MOUTH AND THROAT WITH OR WITHOUT PRESENCE OF ULCERS  *URINARY PROBLEMS  *BOWEL PROBLEMS  UNUSUAL RASH Items with * indicate a potential emergency and should be followed up as soon as possible.  Feel free to call the clinic you have any questions or concerns. The clinic phone number is [phone].  Please show the CHEMO ALERT CARD at check-in to the Emergency Department and triage nurse.   

## 2017-06-20 NOTE — Progress Notes (Signed)
. .    HEMATOLOGY/ONCOLOGY CLINIC NOTE  Date of Service: .06/14/2017  Patient Care Team: Jordan Battles, MD as PCP - General (Internal Medicine) Jordan Fredrickson, MD as Consulting Physician (Psychiatry)  CHIEF COMPLAINTS/PURPOSE OF CONSULTATION:  pancytopenia  HISTORY OF PRESENTING ILLNESS:   Jordan Terry is a wonderful 83 y.o. male who has been referred to [place] by Jordan Terry .Jordan Battles, MD/ Jordan Terry Jordan Sine MD for evaluation and management of pancytopenia.  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  He recently transferred his dermatology care to Jordan Terry. Elvera Terry in [place] and had recent labs on 03/29/2017 which showed pancytopenia with a hemoglobin of 10.7 with an MCV of 82.5, platelet count of 45k , WBC count of 2.1k with an [place] of 924.  He reports that he has been off methotrexate for 2 months. Notes no other new medications. No recent symptoms suggestive of a viral infection. No overt fevers chills or night sweats. Does note some fatigue. Denies any previous history of liver disease. No recent infections requiring antibiotics. No new focal bone pain.   INTERVAL HISTORY  Jordan Terry is here for his scheduled f/u prior to 2nd cycle of Vidaza.  No fevers/chills/night sweats/CP/SOB or other evidence of bleeding. No issues with infection. Some fatigue - unchanged.   MEDICAL HISTORY:  Past Medical History:  Diagnosis Date  . Anginal pain ([place]) 20 years ago  . Anxiety   . Arthritis   . Bilateral tinnitus    wears hearing aides  . Cancer [facility])    prostate  . Grover's disease   . Headache(784.0)    not since retired  . Hematuria 04/14/2017  . Pancytopenia ([place]) 04/19/2017  . Self-catheterizes urinary bladder 04/19/2017  . Vertigo     SURGICAL  HISTORY: Past Surgical History:  Procedure Laterality Date  . CARDIAC CATHETERIZATION  "20 years ago"  . CARPAL TUNNEL RELEASE Left 2008  . CATARACT EXTRACTION Bilateral 2014  . CYSTOSCOPY WITH URETHRAL DILATATION N/A 08/14/2013   Procedure: CYSTOSCOPY WITH URETHRAL DILATATION BALLOON DILATION OF URETHRAL STRICTURE;  Surgeon: Jordan Gray, MD;  Location: [facility];  Service: Urology;  Laterality: N/A;  BALLOON DILATION   . CYSTOSCOPY WITH URETHRAL DILATATION N/A 05/07/2014   Procedure: CYSTOSCOPY WITH BALLOON DILATION OF URETHRAL STRICTURE;  Surgeon: Jordan Bring, MD;  Location: [facility];  Service: Urology;  Laterality: N/A;  [place]   "scope"  . NOSE SURGERY  1991, 1998  . RADIOACTIVE SEED IMPLANT  2002   "for prostate cancer"  . SHOULDER SURGERY  2012   right  . THYROIDECTOMY, PARTIAL  1974  . VASECTOMY  1970    SOCIAL HISTORY: Social History   Social History  . Marital status: Married    Spouse name: Jordan Terry  . Number of children: 3  . Years of education: college   Occupational History  . Not on file.   Social History Main Topics  . Smoking status: Former Smoker    Years: 10.00    Types: Cigars    Quit date: 1979  . Smokeless tobacco: Never Used  . Alcohol use No  . Drug use: No  . Sexual activity: Not on file   Other Topics Concern  . Not on file   Social  History Narrative   Patient is married Animal nutritionist) and lives at home with his wife.   Patient has three children.   Patient is retired.   Patient has a college education in [place].   Patient is right-handed.   Patient drinks one cup of coffee daily and one 8 oz of tea daily.    FAMILY HISTORY: Family History  Problem Relation Age of Onset  . Anxiety disorder Mother   . Cancer Father        colon, lung  . Diabetes Brother   . Cancer Brother   . Diabetes Sister   . Leukemia Sister   . Cancer Sister        breast  . Cancer Sister        breast  .  Cancer Sister        thyroid  . Diabetes Sister     ALLERGIES:  is allergic to benadryl [diphenhydramine hcl] and hydrocodone.  MEDICATIONS:  Current Outpatient Prescriptions  Medication Sig Dispense Refill  . acyclovir (ZOVIRAX) 400 MG tablet Take 1 tablet (400 mg total) by mouth daily. 30 tablet 2  . fluconazole (DIFLUCAN) 200 MG tablet Take 1 tablet (200 mg total) by mouth daily. 30 tablet 1  . gabapentin (NEURONTIN) 100 MG capsule Take 200 mg by mouth 3 (three) times daily.     Marland Kitchen LORazepam (ATIVAN) 0.5 MG tablet Take 1 mg by mouth at bedtime.    . nortriptyline (PAMELOR) 25 MG capsule Take 1 capsule (25 mg total) by mouth at bedtime. 90 capsule 1  . ondansetron (ZOFRAN) 8 MG tablet Take 1 tablet (8 mg total) by mouth 2 (two) times daily as needed (Nausea or vomiting). 30 tablet 1  . oxyCODONE-acetaminophen (PERCOCET) 7.5-325 MG per tablet Take 0.5-1 tablets by mouth every 8 (eight) hours as needed (pain.).     Marland Kitchen prochlorperazine (COMPAZINE) 10 MG tablet Take 1 tablet (10 mg total) by mouth every 6 (six) hours as needed (Nausea or vomiting). 30 tablet 1  . temazepam (RESTORIL) 15 MG capsule Take 15 mg by mouth at bedtime.    Marland Kitchen tetrahydrozoline-zinc (VISINE-AC) 0.05-0.25 % ophthalmic solution Place 1 drop into both eyes daily as needed (allergies).    . triamcinolone cream (KENALOG) 0.1 % Apply 1 application topically every morning. APPLY TO CHEST AND BACK EVERY MORNING FOR GROVER DISEASE     No current facility-administered medications for this visit.     REVIEW OF SYSTEMS:    10 Point review of Systems was done is negative except as noted above.  PHYSICAL EXAMINATION: ECOG PERFORMANCE STATUS: 2 - Symptomatic, <50% confined to bed  . Vitals:   06/14/17 1055  BP: (!) 114/55  Pulse: 83  Resp: 17  Temp: 98.3 F (36.8 C)  SpO2: 98%   Filed Weights   06/14/17 1055  Weight: 162 lb 14.4 oz (73.9 kg)   .Body mass index is 20.92 kg/m.  GENERAL:alert, in no acute distress  and comfortable SKIN: no acute rashes, no significant lesions EYES: conjunctiva are pink and non-injected, sclera anicteric OROPHARYNX: MMM, no exudates, no oropharyngeal erythema or ulceration NECK: supple, no JVD LYMPH:  no palpable lymphadenopathy in the cervical, axillary or inguinal regions LUNGS: clear to auscultation b/l with normal respiratory effort HEART: regular rate & rhythm  ABDOMEN:  normoactive bowel sounds , non tender, not distended. Borderline palpable spleen no overt palpable hepatomegaly. Extremity: no pedal edema PSYCH: alert & oriented x 3 with fluent speech NEURO: no focal motor/sensory deficits  LABORATORY DATA:  I have reviewed the data as listed  .Marland Kitchen CBC Latest Ref Rng & Units 06/14/2017 06/07/2017 05/31/2017  WBC 4.0 - 10.3 10e3/uL 1.9(L) 1.6(L) 1.5(L)  Hemoglobin 13.0 - 17.1 g/dL 9.2(L) 8.3(L) 7.8(L)  Hematocrit 38.4 - 49.9 % 28.3(L) 25.7(L) 24.5(L)  Platelets 140 - 400 10e3/uL 32(L) 23(L) 16(L)   ANC600 . CBC    Component Value Date/Time   WBC 1.9 (L) 06/14/2017 1023   WBC 1.9 (L) 05/05/2017 0337   RBC 3.14 (L) 06/14/2017 1023   RBC 2.83 (L) 05/05/2017 0337   HGB 9.2 (L) 06/14/2017 1023   HCT 28.3 (L) 06/14/2017 1023   PLT 32 (L) 06/14/2017 1023   MCV 90.1 06/14/2017 1023   MCH 29.3 06/14/2017 1023   MCH 29.0 05/05/2017 0337   MCHC 32.5 06/14/2017 1023   MCHC 33.5 05/05/2017 0337   RDW 19.7 (H) 06/14/2017 1023   LYMPHSABS 1.2 06/14/2017 1023   MONOABS 0.1 06/14/2017 1023   EOSABS 0.0 06/14/2017 1023   BASOSABS 0.0 06/14/2017 1023    . CMP Latest Ref Rng & Units 06/14/2017 05/31/2017 05/17/2017  Glucose 70 - 140 mg/dl 104 95 91  BUN 7.0 - 26.0 mg/dL 17.8 17.6 13.0  Creatinine 0.7 - 1.3 mg/dL 1.2 1.2 1.0  Sodium 136 - 145 mEq/L 140 141 142  Potassium 3.5 - 5.1 mEq/L 4.2 4.2 4.1  Chloride 101 - 111 mmol/L - - -  CO2 22 - 29 mEq/L 29 29 27   Calcium 8.4 - 10.4 mg/dL 9.5 9.6 9.3  Total Protein 6.4 - 8.3 g/dL 7.3 7.1 7.0  Total Bilirubin 0.20 -  1.20 mg/dL 0.39 0.38 0.56  Alkaline Phos 40 - 150 U/L 54 54 45  AST 5 - 34 U/L 25 22 18   ALT 0 - 55 U/L 31 30 18          RADIOGRAPHIC STUDIES: I have personally reviewed the radiological images as listed and agreed with the findings in the report. No results found.   CT chest/abd/pelvis 04/20/2017 IMPRESSION: Diffuse cystitis involving the urinary bladder. Foley catheter in place.  No evidence of lymphadenopathy or metastatic disease within the chest, abdomen, or pelvis.  2.6 cm left thyroid lobe nodule. Consider thyroid ultrasound for further evaluation. This recommendation follows ACR consensus guidelines: Managing Incidental Thyroid Nodules Detected on Imaging: White Paper of the ACR Incidental Thyroid Findings Committee. J Am Coll Radiol 2015;12(2):143-150.  Colonic diverticulosis. No radiographic evidence of diverticulitis.  Aortic atherosclerosis.   Electronically Signed   By: Earle Gell M.D.   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 He has been on folic acid replacement as well.  Vitamin B12 and folate levels within normal limits. HIV nonreactive Hepatitis C negative LDH within normal limits at 191  Blood counts are better after some improvement from Nadir in his counts from [place].  #2 thrombocytopenia platelet count up from 16k to 32k  #3 neutropenia WBC count of 1.9 k with an ANC of 0.6k -continue Acyclovir and fluconazole for prophylaxis.  #4 normocytic anemia hemoglobin of 9.2 -will schedule for 1 unit of PRBC for symptomatic anemia  PLAN - given nadir counts we will reduce Vidaza to 50mg /m2 for 5 days - Patient will proceed with his current cycle 2  of Vidaza. -Continue weekly  labs -will schedule cycle 3 and 4 of Vidaza -transfuse plt prnfor PLT<10k or if bleeding -transfuse prbc prn for hgb <8 or if symptomatic -continue fluconazole and acyclovir for prophylaxis. -Return to clinic with Jordan Terry. Irene Limbo cycle 3 day 1  #5 atypical lymphocytes noted on peripheral blood smear - monoclonal B lymphocytosis  CT c/a/p with no evidence of lymphadenopathy/hepatosplenomegaly. -no intervention at this time. Monitor   Patient will proceed with his current cycle 2 of Vidaza. Continue weekly labs Please schedule cycle 3 and 4 of Vidaza Return to clinic with Jordan Terry. Irene Limbo cycle 3 day 1   All of the patients questions were answered with apparent satisfaction. The patient knows to call the clinic with any problems, questions or concerns.  I spent 20 minutes counseling the patient face to face. The total time spent in the appointment was 25 minutes and more than 50% was on counseling and direct patient cares.    Sullivan Lone MD [place] AAHIVMS [facility] Hematology/Oncology Physician [facility]  (Office):       [phone] (Work cell):  [phone] (Fax):           [phone]

## 2017-06-21 ENCOUNTER — Telehealth: Payer: Self-pay

## 2017-06-21 ENCOUNTER — Other Ambulatory Visit: Payer: Medicare Other

## 2017-06-21 NOTE — Telephone Encounter (Signed)
Dr. Irene Limbo recommends Senna S 2 pills twice daily and to take additional magnesium citrate. No more suppositories. Risk of bacteremia. Pt to go to ED if pain becomes too severe to manage. Pt and wife to come for labs tomorrow. To notify secretary if still no BM. She will contact me and order will be placed for prescription lactulose per MD. Pt wife verbalized understanding.

## 2017-06-21 NOTE — Telephone Encounter (Signed)
Wife called. Pt's LBM 5 days ago. She has placed 3 glycerine suppositories about an hour ago.  She does have mag citrate. Instructed her to wait another 1 hour then to use the mag citrate.  Use 1/2 bottle and if no results in 4 hours use the other 1/2.  B/c of this, she has moved CBC to tomorrow at 11.

## 2017-06-21 NOTE — Telephone Encounter (Signed)
Pt having trouble with constipation. Was taking polyethylene glycol as prescribed. Now taking magnesium citrate 1/2 bottle and three glycerin suppositories. Will discuss with Dr. Irene Limbo to see what recommendation is. Taking additional OTC medications at home or having treated in ED. Will call pt and wife back. Wife verbalized understanding.

## 2017-06-22 ENCOUNTER — Other Ambulatory Visit: Payer: Medicare Other

## 2017-06-22 ENCOUNTER — Observation Stay (HOSPITAL_COMMUNITY)
Admission: EM | Admit: 2017-06-22 | Discharge: 2017-06-23 | Disposition: A | Discharge status: Home / Self Care | Payer: Medicare Other | Attending: Internal Medicine | Admitting: Internal Medicine

## 2017-06-22 ENCOUNTER — Encounter (HOSPITAL_COMMUNITY): Payer: Self-pay

## 2017-06-22 ENCOUNTER — Emergency Department (HOSPITAL_COMMUNITY): Payer: Medicare Other

## 2017-06-22 DIAGNOSIS — K5900 Constipation, unspecified: Secondary | ICD-10-CM | POA: Diagnosis not present

## 2017-06-22 DIAGNOSIS — Z79899 Other long term (current) drug therapy: Secondary | ICD-10-CM | POA: Diagnosis not present

## 2017-06-22 DIAGNOSIS — D649 Anemia, unspecified: Secondary | ICD-10-CM | POA: Diagnosis not present

## 2017-06-22 DIAGNOSIS — Z87891 Personal history of nicotine dependence: Secondary | ICD-10-CM | POA: Insufficient documentation

## 2017-06-22 DIAGNOSIS — D469 Myelodysplastic syndrome, unspecified: Secondary | ICD-10-CM | POA: Diagnosis present

## 2017-06-22 DIAGNOSIS — R339 Retention of urine, unspecified: Secondary | ICD-10-CM | POA: Diagnosis present

## 2017-06-22 DIAGNOSIS — D638 Anemia in other chronic diseases classified elsewhere: Secondary | ICD-10-CM | POA: Diagnosis not present

## 2017-06-22 DIAGNOSIS — D696 Thrombocytopenia, unspecified: Secondary | ICD-10-CM

## 2017-06-22 LAB — COMPREHENSIVE METABOLIC PANEL
ALT: 18 U/L (ref 17–63)
AST: 20 U/L (ref 15–41)
Albumin: 4.1 g/dL (ref 3.5–5.0)
Alkaline Phosphatase: 45 U/L (ref 38–126)
Anion gap: 6 (ref 5–15)
BUN: 18 mg/dL (ref 6–20)
CO2: 29 mmol/L (ref 22–32)
Calcium: 9 mg/dL (ref 8.9–10.3)
Chloride: 103 mmol/L (ref 101–111)
Creatinine, Ser: 1.05 mg/dL (ref 0.61–1.24)
GFR calc Af Amer: >60 mL/min (ref >60)
GFR calc non Af Amer: >60 mL/min (ref >60)
Glucose, Bld: 117 mg/dL — ABNORMAL HIGH (ref 65–99)
Potassium: 4 mmol/L (ref 3.5–5.1)
Sodium: 138 mmol/L (ref 135–145)
Total Bilirubin: 0.5 mg/dL (ref 0.3–1.2)
Total Protein: 7 g/dL (ref 6.5–8.1)

## 2017-06-22 LAB — URINALYSIS, ROUTINE W REFLEX MICROSCOPIC
Bacteria, UA: NONE SEEN
Bilirubin Urine: NEGATIVE
Glucose, UA: NEGATIVE mg/dL
Ketones, ur: NEGATIVE mg/dL
Leukocytes, UA: NEGATIVE
Nitrite: NEGATIVE
Protein, ur: NEGATIVE mg/dL
Specific Gravity, Urine: 1.01 (ref 1.005–1.030)
Squamous Epithelial / LPF: NONE SEEN
pH: 8 (ref 5.0–8.0)

## 2017-06-22 LAB — CBC WITH DIFFERENTIAL/PLATELET
Basophils Absolute: 0 10*3/uL (ref 0.0–0.1)
Basophils Relative: 1 %
Eosinophils Absolute: 0 10*3/uL (ref 0.0–0.7)
Eosinophils Relative: 3 %
HCT: 23 % — ABNORMAL LOW (ref 39.0–52.0)
Hemoglobin: 7.9 g/dL — ABNORMAL LOW (ref 13.0–17.0)
Lymphocytes Relative: 47 %
Lymphs Abs: 0.8 10*3/uL (ref 0.7–4.0)
MCH: 29.5 pg (ref 26.0–34.0)
MCHC: 34.3 g/dL (ref 30.0–36.0)
MCV: 85.8 fL (ref 78.0–100.0)
Monocytes Absolute: 0.1 10*3/uL (ref 0.1–1.0)
Monocytes Relative: 6 %
Neutro Abs: 0.6 10*3/uL — ABNORMAL LOW (ref 1.7–7.7)
Neutrophils Relative %: 43 %
Platelets: 20 10*3/uL — CL (ref 150–400)
RBC: 2.68 MIL/uL — ABNORMAL LOW (ref 4.22–5.81)
RDW: 19.2 % — ABNORMAL HIGH (ref 11.5–15.5)
WBC: 1.5 10*3/uL — ABNORMAL LOW (ref 4.0–10.5)

## 2017-06-22 LAB — PREPARE RBC (CROSSMATCH)

## 2017-06-22 MED ORDER — TRIAMCINOLONE ACETONIDE 0.1 % EX CREA
1.0000 "application " | TOPICAL_CREAM | Freq: Every morning | CUTANEOUS | Status: DC
  Administered 2017-06-22 – 2017-06-23 (×2): 1 via TOPICAL
  Filled 2017-06-22: qty 15

## 2017-06-22 MED ORDER — FLUCONAZOLE 100 MG PO TABS
200.0000 mg | ORAL_TABLET | Freq: Every day | ORAL | Status: DC
  Administered 2017-06-22 – 2017-06-23 (×2): 200 mg via ORAL
  Filled 2017-06-22 (×2): qty 2

## 2017-06-22 MED ORDER — ONDANSETRON HCL 4 MG PO TABS: 4.0000 mg | ORAL_TABLET | Freq: Four times a day (QID) | ORAL | Status: DC | PRN

## 2017-06-22 MED ORDER — SODIUM CHLORIDE 0.9% FLUSH: 3.0000 mL | INTRAVENOUS | Status: DC | PRN

## 2017-06-22 MED ORDER — ACETAMINOPHEN 325 MG PO TABS: 650.0000 mg | ORAL_TABLET | Freq: Four times a day (QID) | ORAL | Status: DC | PRN

## 2017-06-22 MED ORDER — HEPARIN SOD (PORK) LOCK FLUSH 100 UNIT/ML IV SOLN: 250.0000 [IU] | INTRAVENOUS | Status: DC | PRN

## 2017-06-22 MED ORDER — ACYCLOVIR 400 MG PO TABS
400.0000 mg | ORAL_TABLET | Freq: Every day | ORAL | Status: DC
  Administered 2017-06-22 – 2017-06-23 (×2): 400 mg via ORAL
  Filled 2017-06-22 (×2): qty 1

## 2017-06-22 MED ORDER — SODIUM CHLORIDE 0.9% FLUSH: 10.0000 mL | INTRAVENOUS | Status: DC | PRN

## 2017-06-22 MED ORDER — SODIUM CHLORIDE 0.9 % IV SOLN: 250.0000 mL | Freq: Once | INTRAVENOUS | Status: DC

## 2017-06-22 MED ORDER — OXYCODONE-ACETAMINOPHEN 7.5-325 MG PO TABS
0.5000 | ORAL_TABLET | Freq: Three times a day (TID) | ORAL | Status: DC | PRN
  Administered 2017-06-22 – 2017-06-23 (×2): 1 via ORAL
  Filled 2017-06-22 (×2): qty 1

## 2017-06-22 MED ORDER — SORBITOL 70 % SOLN
960.0000 mL | TOPICAL_OIL | Freq: Once | ORAL | Status: AC
  Administered 2017-06-22: 960 mL via RECTAL
  Filled 2017-06-22: qty 240

## 2017-06-22 MED ORDER — ONDANSETRON HCL 4 MG/2ML IJ SOLN: 4.0000 mg | Freq: Four times a day (QID) | INTRAMUSCULAR | Status: DC | PRN

## 2017-06-22 MED ORDER — SODIUM CHLORIDE 0.9 % IV SOLN: 10.0000 mL/h | Freq: Once | INTRAVENOUS | Status: DC

## 2017-06-22 MED ORDER — NORTRIPTYLINE HCL 25 MG PO CAPS
25.0000 mg | ORAL_CAPSULE | Freq: Every day | ORAL | Status: DC
  Administered 2017-06-22: 25 mg via ORAL
  Filled 2017-06-22 (×2): qty 1

## 2017-06-22 MED ORDER — ONDANSETRON HCL 8 MG PO TABS: 8.0000 mg | ORAL_TABLET | Freq: Two times a day (BID) | ORAL | Status: DC | PRN

## 2017-06-22 MED ORDER — POLYETHYLENE GLYCOL 3350 17 G PO PACK
17.0000 g | PACK | Freq: Two times a day (BID) | ORAL | Status: DC
  Administered 2017-06-22: 17 g via ORAL
  Filled 2017-06-22: qty 1

## 2017-06-22 MED ORDER — TEMAZEPAM 15 MG PO CAPS
15.0000 mg | ORAL_CAPSULE | Freq: Every day | ORAL | Status: DC
  Administered 2017-06-22: 15 mg via ORAL
  Filled 2017-06-22: qty 1

## 2017-06-22 MED ORDER — HEPARIN SOD (PORK) LOCK FLUSH 100 UNIT/ML IV SOLN: 500.0000 [IU] | Freq: Every day | INTRAVENOUS | Status: DC | PRN

## 2017-06-22 MED ORDER — ACETAMINOPHEN 650 MG RE SUPP: 650.0000 mg | Freq: Four times a day (QID) | RECTAL | Status: DC | PRN

## 2017-06-22 MED ORDER — GABAPENTIN 100 MG PO CAPS
200.0000 mg | ORAL_CAPSULE | Freq: Three times a day (TID) | ORAL | Status: DC
  Administered 2017-06-22 – 2017-06-23 (×5): 200 mg via ORAL
  Filled 2017-06-22 (×5): qty 2

## 2017-06-22 MED ORDER — LORAZEPAM 1 MG PO TABS
1.0000 mg | ORAL_TABLET | Freq: Every day | ORAL | Status: DC
  Administered 2017-06-22: 1 mg via ORAL
  Filled 2017-06-22: qty 1

## 2017-06-22 MED ORDER — PROCHLORPERAZINE MALEATE 10 MG PO TABS: 10.0000 mg | ORAL_TABLET | Freq: Four times a day (QID) | ORAL | Status: DC | PRN

## 2017-06-22 MED ORDER — SENNOSIDES-DOCUSATE SODIUM 8.6-50 MG PO TABS
2.0000 | ORAL_TABLET | Freq: Two times a day (BID) | ORAL | Status: DC
  Administered 2017-06-22 – 2017-06-23 (×2): 2 via ORAL
  Filled 2017-06-22 (×2): qty 2

## 2017-06-22 NOTE — Progress Notes (Signed)
Patient seen and examined today, admitted earlier this morning by Dr. Glyn Ade  Pleasant 83 year old male with myelodysplastic syndrome and chronic anemia/pancytopenia, followed by Dr. Irene Limbo, who came to the ED with constipation and was found to have worsening anemia.  He was ordered 2 units of packed red blood cell, enema, and was admitted to the hospital   Pancytopenia with worsening anemia likely from MDS -2 units of packed red blood cells were ordered, will repeat CBC afterwards -Closely monitor platelets on repeat CBC, currently 20 -undergoing chemotherapy in the infusion center -Continue acyclovir and fluconazole for prophylaxis due to neutropenia  Constipation -Enema to be given today  History of prostate cancer  Urinary obstruction -Patient does self-catheterization  History of Grover's disease -Topical cream currently, was followed at dermatology at [facility] and has been on methotrexate in the past   Costin M. Cruzita Lederer, MD [facility] [phone]  If 7PM-7AM, please contact night-coverage [email or web address] Password [email or web address]

## 2017-06-22 NOTE — ED Triage Notes (Signed)
Patient states he is getting chemo for leukemia. Patient states he has been having problems with constipation for the past 6 days and states urinary retention-patient states he has been catheterizing himself at home trying to relieve the rectal pressure but has been unsuccessful. Patient states severe rectal pain.

## 2017-06-22 NOTE — ED Provider Notes (Addendum)
[place] DEPT Provider Note   CSN: [id number] Arrival date & time: 06/22/17  0059     History   Chief Complaint Chief Complaint  Patient presents with  . Constipation  . Urinary Retention    HPI Jordan Terry is a 83 y.o. male with history of myelodysplastic syndrome and chemotherapy and prostate cancer in remission who self catheterizes who presents with a six-day history of constipation. He has had to catheterize more often since constipation onset. He normally catheterizes twice per day and has had to catheterize 4 times per day since onset. Patient has associated severe rectal pain. Patient reports abdominal pain typical of the pain he feels with his chemotherapy injections of his abdomen. He denies fever or bloody stools. He also denies chest pain, shortness of breath.  HPI  Past Medical History:  Diagnosis Date  . Anginal pain ([place]) 20 years ago  . Anxiety   . Arthritis   . Bilateral tinnitus    wears hearing aides  . Cancer [facility])    prostate  . Grover's disease   . Headache(784.0)    not since retired  . Hematuria 04/14/2017  . Pancytopenia ([place]) 04/19/2017  . Self-catheterizes urinary bladder 04/19/2017  . Vertigo     Patient Active Problem List   Diagnosis Date Noted  . Anemia 06/22/2017  . Constipation 06/22/2017  . Myelodysplastic syndrome ([place]) 05/07/2017  . Gross hematuria 04/26/2017  . 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 Dyspnea 04/26/2017  . NHL (non-Hodgkin's lymphoma) ([place])   . Urethral bleeding 04/19/2017  . Sepsis ([place]) 03/09/2016  . Prostate cancer ([place]) 03/08/2016  . Hematuria 03/08/2016  . Fever and chills 03/08/2016  . Fever 03/08/2016  . Abnormality of gait 05/31/2013  . Dizziness and giddiness 05/31/2013    Past Surgical History:  Procedure Laterality Date  . CARDIAC CATHETERIZATION  "20 years ago"  . CARPAL TUNNEL RELEASE Left 2008  . CATARACT EXTRACTION Bilateral 2014  . CYSTOSCOPY WITH URETHRAL DILATATION N/A 08/14/2013   Procedure: CYSTOSCOPY WITH URETHRAL DILATATION BALLOON DILATION OF URETHRAL STRICTURE;  Surgeon: Dutch Gray, MD;  Location: [facility];  Service: Urology;  Laterality: N/A;  BALLOON DILATION   . CYSTOSCOPY WITH URETHRAL DILATATION N/A 05/07/2014   Procedure: CYSTOSCOPY WITH BALLOON DILATION OF URETHRAL STRICTURE;  Surgeon: Raynelle Bring, MD;  Location: [facility];  Service: Urology;  Laterality: N/A;  [place]   "scope"  . NOSE SURGERY  1991, 1998  . RADIOACTIVE SEED IMPLANT  2002   "for prostate cancer"  . SHOULDER SURGERY  2012   right  . THYROIDECTOMY, PARTIAL  1974  . VASECTOMY  1970       Home Medications    Prior to Admission medications   Medication Sig Start Date End Date Taking? Authorizing Provider  acyclovir (ZOVIRAX) 400 MG tablet Take 1 tablet (400 mg total) by mouth daily. 05/17/17  Yes Brunetta Genera, MD  fluconazole (DIFLUCAN) 200 MG tablet Take 1 tablet (200 mg total) by mouth daily. 05/17/17  Yes Brunetta Genera, MD  gabapentin (NEURONTIN) 100 MG capsule Take 200 mg by mouth 3 (three) times daily.    Yes [provider]  LORazepam (ATIVAN) 0.5 MG tablet Take 1 mg by mouth at bedtime.   Yes [provider]  nortriptyline (PAMELOR) 25 MG capsule Take 1 capsule (25 mg total) by mouth at bedtime. 01/19/17  Yes Dennie Bible, NP  ondansetron [facility]) 8  MG tablet Take 1 tablet (8 mg total) by mouth 2 (two) times daily as needed (Nausea or vomiting). 05/17/17  Yes Brunetta Genera, MD  oxyCODONE-acetaminophen (PERCOCET) 7.5-325 MG per tablet Take 0.5-1 tablets by mouth every 8 (eight) hours as needed (pain.).    Yes [provider]  prochlorperazine (COMPAZINE) 10 MG tablet Take 1 tablet (10 mg total) by mouth every 6 (six) hours as needed (Nausea or vomiting). 05/17/17  Yes Brunetta Genera, MD  temazepam (RESTORIL) 15 MG capsule Take 15 mg by mouth at bedtime.   Yes [provider]  tetrahydrozoline-zinc (VISINE-AC) 0.05-0.25 % ophthalmic solution Place 1 drop into both eyes daily as needed (allergies).   Yes [provider]  triamcinolone cream (KENALOG) 0.1 % Apply 1 application topically every morning. APPLY TO CHEST AND BACK EVERY MORNING FOR [place] DISEASE   Yes [provider]    Family History Family History  Problem Relation Age of Onset  . Anxiety disorder Mother   . Cancer Father        colon, lung  . Diabetes Brother   . Cancer Brother   . Diabetes Sister   . Leukemia Sister   . Cancer Sister        breast  . Cancer Sister        breast  . Cancer Sister        thyroid  . Diabetes Sister     Social History Social History  Substance Use Topics  . Smoking status: Former Smoker    Years: 10.00    Types: Cigars    Quit date: 1979  . Smokeless tobacco: Never Used  . Alcohol use No     Allergies   Benadryl [diphenhydramine hcl] and Hydrocodone   Review of Systems Review of Systems  Constitutional: Negative for chills and fever.  HENT: Negative for facial swelling and sore throat.   Respiratory: Negative for shortness of breath.   Cardiovascular: Negative for chest pain.  Gastrointestinal: Positive for abdominal pain (chronic), constipation and rectal pain. Negative for blood in stool, nausea and vomiting.  Genitourinary: Positive for decreased urine volume and difficulty urinating. Negative for dysuria.  Musculoskeletal: Negative for back pain.  Skin: Negative for rash and wound.  Neurological: Negative for headaches.  Psychiatric/Behavioral: The patient is not nervous/anxious.      Physical Exam Updated Vital Signs BP 122/69 (BP Location: Left Arm)   Pulse (!) 115   Temp 98.8 F (37.1 C) (Oral)   Resp 18   Ht 6\' 2"  (1.88 m)   Wt 75 kg (165 lb 5.5 oz)   SpO2 100%   BMI 21.23 kg/m   Physical Exam  Constitutional: He appears well-developed and well-nourished. No distress.  HENT:  Head:  Normocephalic and atraumatic.  Mouth/Throat: Oropharynx is clear and moist. No oropharyngeal exudate.  Eyes: Pupils are equal, round, and reactive to light. Conjunctivae are normal. Right eye exhibits no discharge. Left eye exhibits no discharge. No scleral icterus.  Neck: Normal range of motion. Neck supple. No thyromegaly present.  Cardiovascular: Normal rate, regular rhythm, normal heart sounds and intact distal pulses.  Exam reveals no gallop and no friction rub.   No murmur heard. Pulmonary/Chest: Effort normal and breath sounds normal. No stridor. No respiratory distress. He has no wheezes. He has no rales.  Abdominal: Soft. Bowel sounds are normal. He exhibits no distension. There is tenderness in the suprapubic area. There is no rebound and no guarding.  Genitourinary:  Genitourinary  Comments: Fecal impaction unable to move on rectal exam  Musculoskeletal: He exhibits no edema.  Lymphadenopathy:    He has no cervical adenopathy.  Neurological: He is alert. Coordination normal.  Skin: Skin is warm and dry. No rash noted. He is not diaphoretic. No pallor.  Psychiatric: He has a normal mood and affect.  Nursing note and vitals reviewed.    ED Treatments / Results  Labs (all labs ordered are listed, but only abnormal results are displayed) Labs Reviewed  CBC WITH DIFFERENTIAL/PLATELET - Abnormal; Notable for the following:       Result Value   WBC 1.5 (*)    RBC 2.68 (*)    Hemoglobin 7.9 (*)    HCT 23.0 (*)    RDW 19.2 (*)    Platelets 20 (*)    Neutro Abs 0.6 (*)    All other components within normal limits  COMPREHENSIVE METABOLIC PANEL - Abnormal; Notable for the following:    Glucose, Bld 117 (*)    All other components within normal limits  URINALYSIS, ROUTINE W REFLEX MICROSCOPIC - Abnormal; Notable for the following:    Color, Urine YELLOW (*)    APPearance HAZY (*)    Hgb urine dipstick TRACE (*)    All other components within normal limits  GENETIC / FISH  TESTING; [place], MANUAL LAB REQUISITION  LYMPHOCYTE SUBSETS, FLOW CYTOMETRY (INPT)  TYPE AND SCREEN  PREPARE RBC (CROSSMATCH)    EKG  EKG Interpretation None       Radiology Dg Abdomen Acute W/chest  Result Date: 06/22/2017 CLINICAL DATA:  Lower abdominal pain for 1 week. EXAM: DG ABDOMEN ACUTE W/ 1V CHEST COMPARISON:  03/08/2016 FINDINGS: The abdominal gas pattern is negative for obstruction or perforation. Prostatic seed implants noted. No biliary or urinary calculi. The upright view of the chest is negative for acute cardiopulmonary abnormality and is unchanged from 03/08/2016. IMPRESSION: Negative abdominal radiographs.  No acute cardiopulmonary disease. Electronically Signed   By: Andreas Newport M.D.   On: 06/22/2017 03:13    Procedures Fecal disimpaction Date/Time: 06/22/2017 7:09 AM Performed by: Frederica Kuster Authorized by: Frederica Kuster  Consent: Verbal consent obtained. Consent given by: patient Patient identity confirmed: verbally with patient Local anesthesia used: no  Anesthesia: Local anesthesia used: no  Sedation: Patient sedated: no Patient tolerance: Patient tolerated the procedure well with no immediate complications Comments: Unsuccessful attempt, stool is too hard    (including critical care time)  CRITICAL CARE Performed by: Frederica Kuster   Total critical care time: 30 minutes  Critical care time was exclusive of separately billable procedures and treating other patients.  Critical care was necessary to treat or prevent imminent or life-threatening deterioration.  Critical care was time spent personally by me on the following activities: development of treatment plan with patient and/or surrogate as well as nursing, discussions with consultants, evaluation of patient's response to treatment, examination of patient, obtaining history from patient or surrogate, ordering and performing treatments and interventions, ordering and review of  laboratory studies, ordering and review of radiographic studies, pulse oximetry and re-evaluation of patient's condition.   Medications Ordered in ED Medications  0.9 %  sodium chloride infusion (not administered)  acyclovir (ZOVIRAX) tablet 400 mg (not administered)  fluconazole (DIFLUCAN) tablet 200 mg (not administered)  ondansetron (ZOFRAN) tablet 8 mg (not administered)  prochlorperazine (COMPAZINE) tablet 10 mg (not administered)  nortriptyline (PAMELOR) capsule 25 mg (not administered)  gabapentin (NEURONTIN) capsule 200 mg (not administered)  LORazepam (ATIVAN) tablet 1 mg (not administered)  oxyCODONE-acetaminophen (PERCOCET) 7.5-325 MG per tablet 0.5-1 tablet (not administered)  triamcinolone cream (KENALOG) 0.1 % 1 application (not administered)  temazepam (RESTORIL) capsule 15 mg (not administered)  acetaminophen (TYLENOL) tablet 650 mg (not administered)    Or  acetaminophen (TYLENOL) suppository 650 mg (not administered)  ondansetron (ZOFRAN) tablet 4 mg (not administered)    Or  ondansetron (ZOFRAN) injection 4 mg (not administered)  0.9 %  sodium chloride infusion (not administered)  heparin lock flush 100 unit/mL (not administered)  heparin lock flush 100 unit/mL (not administered)  sodium chloride flush (NS) 0.9 % injection 10 mL (not administered)  sodium chloride flush (NS) 0.9 % injection 3 mL (not administered)  sorbitol, milk of mag, mineral oil, glycerin (SMOG) enema (960 mLs Rectal Given 06/22/17 0607)     Initial Impression / Assessment and Plan / ED Course  I have reviewed the triage vital signs and the nursing notes.  Pertinent labs & imaging results that were available during my care of the patient were reviewed by me and considered in my medical decision making (see chart for details).     Patient with constipation and fecal impaction, also with pancytopenia with hemoglobin 7.9, WBC 1.5, platelet 20K. Fecal disimpaction unsuccessful due to stool  being too hard. Smog enema unsuccessful as well due to patient unable to retain the enema for its efficacy. Patient offered CT abdomen and pelvis considering significant tenderness, however he declined.acute abdominal series is negative for obstruction or perforation. Patient also initially declined hospital admission, however after discussion with the patient's and recommendation for blood transfusion, he agrees. Transfusion initiated in the ED. I consulted hospitalist and Dr. Leonides Schanz spoke with Dr. Hal Hope who will admit the patient for further evaluation and treatment. Patient also evaluated by Dr. Leonides Schanz who guided the patient's management and agrees with plan.  Final Clinical Impressions(s) / ED Diagnoses   Final diagnoses:  Constipation, unspecified constipation type  Anemia, unspecified type    New Prescriptions Current Discharge Medication List           Frederica Kuster, Hershal Coria 06/22/17 0715

## 2017-06-22 NOTE — H&P (Signed)
History and Physical    Jordan Terry UYQ:[id number] DOB: 1931-12-19 DOA: 06/22/2017  PCP: [name], MD  Patient coming from: Home.  Chief Complaint: Constipation.  HPI: Jordan Terry is a 83 y.o. male with known history of pancytopenia likely from M.D. is being followed by oncologist Dr. Irene Limbo presents to the ER with complaints of constipation. Patient states over the last 6 days patient has not moved his bowels. Denies any nausea vomiting. Denies any abdominal pain but has some soreness around the rectum. Patient denies any chest pain shortness of breath fever or chills.  ED Course: In the ER patient was given small dose of any metastatic spread which patient did not have a bowel movement. In addition patient's hemoglobin has dropped from previous of 9-7. Patient has been admitted for further management of anemia and constipation. Patient had declined CT scan of the abdomen.   Review of Systems: As per HPI, rest all negative.   Past Medical History:  Diagnosis Date  . Anginal pain ([place]) 20 years ago  . Anxiety   . Arthritis   . Bilateral tinnitus    wears hearing aides  . Cancer [facility])    prostate  . Grover's disease   . Headache(784.0)    not since retired  . Hematuria 04/14/2017  . Pancytopenia ([place]) 04/19/2017  . Self-catheterizes urinary bladder 04/19/2017  . Vertigo     Past Surgical History:  Procedure Laterality Date  . CARDIAC CATHETERIZATION  "20 years ago"  . CARPAL TUNNEL RELEASE Left 2008  . CATARACT EXTRACTION Bilateral 2014  . CYSTOSCOPY WITH URETHRAL DILATATION N/A 08/14/2013   Procedure: CYSTOSCOPY WITH URETHRAL DILATATION BALLOON DILATION OF URETHRAL STRICTURE;  Surgeon: Dutch Gray, MD;  Location: [facility];  Service: Urology;  Laterality: N/A;  BALLOON DILATION   . CYSTOSCOPY WITH URETHRAL DILATATION N/A 05/07/2014   Procedure: CYSTOSCOPY WITH BALLOON DILATION OF URETHRAL STRICTURE;  Surgeon: Raynelle Bring, MD;  Location: [facility];  Service: Urology;   Laterality: N/A;  [place]   "scope"  . NOSE SURGERY  1991, 1998  . RADIOACTIVE SEED IMPLANT  2002   "for prostate cancer"  . SHOULDER SURGERY  2012   right  . THYROIDECTOMY, PARTIAL  1974  . VASECTOMY  1970     reports that he quit smoking about 39 years ago. His smoking use included Cigars. He quit after 10.00 years of use. He has never used smokeless tobacco. He reports that he does not drink alcohol or use drugs.  Allergies  Allergen Reactions  . Benadryl [Diphenhydramine Hcl] Anxiety    MAKES HIM GO CRAZY  . Hydrocodone Itching    Small amounts are okay    Family History  Problem Relation Age of Onset  . Anxiety disorder Mother   . Cancer Father        colon, lung  . Diabetes Brother   . Cancer Brother   . Diabetes Sister   . Leukemia Sister   . Cancer Sister        breast  . Cancer Sister        breast  . Cancer Sister        thyroid  . Diabetes Sister     Prior to Admission medications   Medication Sig Start Date End Date Taking? Authorizing Provider  acyclovir (ZOVIRAX) 400 MG tablet Take 1 tablet (400 mg total) by mouth daily. 05/17/17  Yes Irene Limbo,  Cloria [place], MD  fluconazole (DIFLUCAN) 200 MG tablet Take 1 tablet (200 mg total) by mouth daily. 05/17/17  Yes Brunetta Genera, MD  gabapentin (NEURONTIN) 100 MG capsule Take 200 mg by mouth 3 (three) times daily.    Yes [provider]  LORazepam (ATIVAN) 0.5 MG tablet Take 1 mg by mouth at bedtime.   Yes [provider]  nortriptyline (PAMELOR) 25 MG capsule Take 1 capsule (25 mg total) by mouth at bedtime. 01/19/17  Yes Dennie Bible, NP  ondansetron (ZOFRAN) 8 MG tablet Take 1 tablet (8 mg total) by mouth 2 (two) times daily as needed (Nausea or vomiting). 05/17/17  Yes Brunetta Genera, MD  oxyCODONE-acetaminophen (PERCOCET) 7.5-325 MG per tablet Take 0.5-1 tablets by mouth every 8 (eight) hours as needed (pain.).     Yes [provider]  prochlorperazine (COMPAZINE) 10 MG tablet Take 1 tablet (10 mg total) by mouth every 6 (six) hours as needed (Nausea or vomiting). 05/17/17  Yes Brunetta Genera, MD  temazepam (RESTORIL) 15 MG capsule Take 15 mg by mouth at bedtime.   Yes [provider]  tetrahydrozoline-zinc (VISINE-AC) 0.05-0.25 % ophthalmic solution Place 1 drop into both eyes daily as needed (allergies).   Yes [provider]  triamcinolone cream (KENALOG) 0.1 % Apply 1 application topically every morning. APPLY TO CHEST AND BACK EVERY MORNING FOR [place] DISEASE   Yes [provider]    Physical Exam: Vitals:   06/22/17 0109 06/22/17 0110 06/22/17 0342 06/22/17 0542  BP: 140/84  112/66 129/86  Pulse: 96  91 83  Resp: 20  16 16   Temp: 98.5 F (36.9 C)     TempSrc: Oral     SpO2: 97% 97% 98% 91%  Weight:  77.1 kg (170 lb)    Height:  6\' 2"  (1.88 m)        Constitutional: Moderately built and nourished. Vitals:   06/22/17 0109 06/22/17 0110 06/22/17 0342 06/22/17 0542  BP: 140/84  112/66 129/86  Pulse: 96  91 83  Resp: 20  16 16   Temp: 98.5 F (36.9 C)     TempSrc: Oral     SpO2: 97% 97% 98% 91%  Weight:  77.1 kg (170 lb)    Height:  6\' 2"  (1.88 m)     Eyes: Anicteric mild pallor. ENMT: No discharge from the ears eyes nose or mouth. Neck: No mass. No JVD appreciated. Respiratory: No rhonchi or crepitations. Cardiovascular: S1 and S2 heard no murmurs appreciated. Abdomen: Soft nontender bowel sounds present. Musculoskeletal: No edema. No joint effusion. Skin: No rash. Skin appears warm. Neurologic: Alert awake oriented to time place and person. Moves all extremities. Psychiatric: Appears normal. Normal affect.   Labs on Admission: I have personally reviewed following labs and imaging studies  CBC:  Recent Labs Lab 06/22/17 0241  WBC 1.5*  NEUTROABS 0.6*  HGB 7.9*  HCT 23.0*  MCV 85.8  PLT 20*   Basic Metabolic Panel:  Recent  Labs Lab 06/22/17 0241  NA 138  K 4.0  CL 103  CO2 29  GLUCOSE 117*  BUN 18  CREATININE 1.05  CALCIUM 9.0   GFR: Estimated Creatinine Clearance: 59.2 mL/min (by C-G formula based on SCr of 1.05 mg/dL). Liver Function Tests:  Recent Labs Lab 06/22/17 0241  AST 20  ALT 18  ALKPHOS 45  BILITOT 0.5  PROT 7.0  ALBUMIN 4.1   No results for input(s): LIPASE, AMYLASE in the last 168 hours.  No results for input(s): AMMONIA in the last 168 hours. Coagulation Profile: No results for input(s): INR, PROTIME in the last 168 hours. Cardiac Enzymes: No results for input(s): CKTOTAL, CKMB, CKMBINDEX, TROPONINI in the last 168 hours. BNP (last 3 results) No results for input(s): PROBNP in the last 8760 hours. HbA1C: No results for input(s): HGBA1C in the last 72 hours. CBG: No results for input(s): GLUCAP in the last 168 hours. Lipid Profile: No results for input(s): CHOL, HDL, LDLCALC, TRIG, CHOLHDL, LDLDIRECT in the last 72 hours. Thyroid Function Tests: No results for input(s): TSH, T4TOTAL, FREET4, T3FREE, THYROIDAB in the last 72 hours. Anemia Panel: No results for input(s): VITAMINB12, FOLATE, FERRITIN, TIBC, IRON, RETICCTPCT in the last 72 hours. Urine analysis:    Component Value Date/Time   COLORURINE YELLOW 04/19/2017 2219   APPEARANCEUR CLOUDY (A) 04/19/2017 2219   LABSPEC 1.013 04/19/2017 2219   PHURINE 7.0 04/19/2017 2219   GLUCOSEU NEGATIVE 04/19/2017 2219   HGBUR LARGE (A) 04/19/2017 2219   BILIRUBINUR NEGATIVE 04/19/2017 2219   [place] 04/19/2017 2219   PROTEINUR 100 (A) 04/19/2017 2219   UROBILINOGEN 0.2 03/29/2015 0611   NITRITE POSITIVE (A) 04/19/2017 2219   LEUKOCYTESUR LARGE (A) 04/19/2017 2219   Sepsis Labs: @LABRCNTIP (procalcitonin:4,lacticidven:4) ) Recent Results (from the past 240 hour(s))  TECHNOLOGIST REVIEW     Status: None   Collection Time: 06/14/17 10:23 AM  Result Value Ref Range Status   Technologist Review   Final    Few  Large & giant platelets, mod ovalos, few fragments and helmets     Radiological Exams on Admission: Dg Abdomen Acute W/chest  Result Date: 06/22/2017 CLINICAL DATA:  Lower abdominal pain for 1 week. EXAM: DG ABDOMEN ACUTE W/ 1V CHEST COMPARISON:  03/08/2016 FINDINGS: The abdominal gas pattern is negative for obstruction or perforation. Prostatic seed implants noted. No biliary or urinary calculi. The upright view of the chest is negative for acute cardiopulmonary abnormality and is unchanged from 03/08/2016. IMPRESSION: Negative abdominal radiographs.  No acute cardiopulmonary disease. Electronically Signed   By: Andreas Newport M.D.   On: 06/22/2017 03:13     Assessment/Plan Principal Problem:   Anemia Active Problems:   Myelodysplastic syndrome (HCC)   Constipation    1. Severe pancytopenia with worsening anemia likely from MDS - 2 units of packed red blood cell transfusion has been ordered. Follow CBC and if there is any further worsening of platelet count may need transfusion or discuss with patient's oncologist. 2. Constipation - I have ordered soapsuds enema. Based on which we would have further plans. 3. History of Grover's disease - patient gets topical cream at this time. 4. History of prostate cancer.  Patient does self cath for urination.  Reviewed patient's old chart some labs.   DVT prophylaxis: SCDs. Patient has severe thrombocytopenia. Code Status: Full code.  Family Communication: Discussed with patient.  Disposition Plan: Home.  Consults called: None.  Admission status: Observation.    Rise Patience MD [facility] Pager [phone].  If 7PM-7AM, please contact night-coverage [email or web address] Password [email or web address]  06/22/2017, 5:50 AM

## 2017-06-22 NOTE — ED Provider Notes (Signed)
Medical screening examination/treatment/procedure(s) were conducted as a shared visit with non-physician practitioner(s) and myself.  I personally evaluated the patient during the encounter.   EKG Interpretation None      Patient is an 83 year old male with history of leukemia on chemotherapy, prostate cancer in remission who has to self catheterize who presents emergency department with constipation. Last bowel movement 6 days ago. Has had nausea but no vomiting.  Has abdominal tenderness especially in the left side the patient reports that this is from receiving "chemotherapy injections". He states his pain is chronic. He denies any fevers. On exam, patient is tender throughout the abdomen especially in the left upper and lower quadrant. He has fecal impaction with no blood or melena. Normal rectal temperature. Labs show pancytopenia with worsening hemoglobin of 7.9. We have recommended admission for transfusion.  We have attempted enema without any success. Will admit to hospitalist for blood transfusion and aggressive bowel regimen.  Abdominal x-ray shows no free air or bowel obstruction. Have offered patient a CT scan given left-sided abdominal pain on exam but he claims that this is chronic and declined CT imaging.   Marquis Down, Delice Bison, DO 06/22/17 [phone]

## 2017-06-22 NOTE — Progress Notes (Signed)
SMOG enema given, patient tolerated it well. Had small amount of bowel movement.

## 2017-06-23 ENCOUNTER — Other Ambulatory Visit: Payer: Self-pay | Admitting: Hematology

## 2017-06-23 DIAGNOSIS — K5903 Drug induced constipation: Secondary | ICD-10-CM

## 2017-06-23 LAB — CBC WITH DIFFERENTIAL/PLATELET
BASOS PCT: 0 %
Basophils Absolute: 0 10*3/uL (ref 0.0–0.1)
Eosinophils Absolute: 0 10*3/uL (ref 0.0–0.7)
Eosinophils Relative: 1 %
HCT: 27.8 % — ABNORMAL LOW (ref 39.0–52.0)
HEMOGLOBIN: 9.6 g/dL — AB (ref 13.0–17.0)
LYMPHS PCT: 52 %
Lymphs Abs: 0.8 10*3/uL (ref 0.7–4.0)
MCH: 29.9 pg (ref 26.0–34.0)
MCHC: 34.5 g/dL (ref 30.0–36.0)
MCV: 86.6 fL (ref 78.0–100.0)
MONO ABS: 0.1 10*3/uL (ref 0.1–1.0)
MONOS PCT: 6 %
NEUTROS ABS: 0.6 10*3/uL — AB (ref 1.7–7.7)
NEUTROS PCT: 41 %
PLATELETS: 19 10*3/uL — AB (ref 150–400)
RBC: 3.21 MIL/uL — ABNORMAL LOW (ref 4.22–5.81)
RDW: 18.1 % — ABNORMAL HIGH (ref 11.5–15.5)
WBC: 1.4 10*3/uL — CL (ref 4.0–10.5)

## 2017-06-23 LAB — BASIC METABOLIC PANEL
ANION GAP: 5 (ref 5–15)
BUN: 16 mg/dL (ref 6–20)
CALCIUM: 8.6 mg/dL — AB (ref 8.9–10.3)
CHLORIDE: 101 mmol/L (ref 101–111)
CO2: 30 mmol/L (ref 22–32)
Creatinine, Ser: 1.16 mg/dL (ref 0.61–1.24)
GFR calc non Af Amer: 57 mL/min — ABNORMAL LOW (ref >60)
GLUCOSE: 116 mg/dL — AB (ref 65–99)
POTASSIUM: 3.9 mmol/L (ref 3.5–5.1)
Sodium: 136 mmol/L (ref 135–145)

## 2017-06-23 LAB — TYPE AND SCREEN
ABO/RH(D): A POS
Antibody Screen: NEGATIVE
Unit division: 0
Unit division: 0

## 2017-06-23 LAB — BPAM RBC
Blood Product Expiration Date: 201809132359
Blood Product Expiration Date: 201809142359
ISSUE DATE / TIME: 201808280639
ISSUE DATE / TIME: 201808281006
Unit Type and Rh: 6200
Unit Type and Rh: 6200

## 2017-06-23 MED ORDER — SENNOSIDES-DOCUSATE SODIUM 8.6-50 MG PO TABS: 2.0000 | ORAL_TABLET | Freq: Two times a day (BID) | ORAL | 0 refills | Status: AC

## 2017-06-23 MED ORDER — HYDROCORTISONE 2.5 % RE CREA: TOPICAL_CREAM | Freq: Four times a day (QID) | RECTAL | 0 refills | Status: DC

## 2017-06-23 MED ORDER — HYDROCORTISONE 2.5 % RE CREA
TOPICAL_CREAM | Freq: Four times a day (QID) | RECTAL | Status: DC
  Administered 2017-06-23: 10:00:00 via RECTAL
  Filled 2017-06-23: qty 28.35

## 2017-06-23 MED ORDER — WITCH HAZEL-GLYCERIN EX PADS
MEDICATED_PAD | CUTANEOUS | Status: DC | PRN
Start: 2017-06-23 — End: 2017-06-23
  Filled 2017-06-23: qty 100

## 2017-06-23 MED ORDER — POLYETHYLENE GLYCOL 3350 17 G PO PACK: 17.0000 g | PACK | Freq: Two times a day (BID) | ORAL | 0 refills | Status: DC

## 2017-06-23 NOTE — Discharge Summary (Signed)
Physician Discharge Summary  Jordan Terry PPJ:[id number] DOB: 29-Nov-1931 DOA: 06/22/2017  PCP: [name], MD  Admit date: 06/22/2017 Discharge date: 06/23/2017  Admitted From: Home Disposition:  Home  Recommendations for Outpatient Follow-up:  1. Follow up with PCP in 1 week 2. Follow up with Dr. Irene Limbo in September as scheduled, call his office with questions  3. Please obtain CBC in 1 week   Home Health: PT Equipment/Devices: None    Discharge Condition: Stable CODE STATUS: Full  Diet recommendation: Heart healthy   Brief/Interim Summary by Dr. Hal Hope: Jordan Terry is a 83 y.o. male with known history of pancytopenia likely from MDS being followed by oncologist Dr. Irene Limbo presents to the ER with complaints of constipation. Patient states over the last 6 days patient has not moved his bowels. Denies any nausea vomiting. Denies any abdominal pain but has some soreness around the rectum. Patient denies any chest pain shortness of breath fever or chills.   Interim: The patient was transfused 2 units of packed red blood cells due to acute on chronic anemia. Hemoglobin has remained stable, without acute bleeding. His constipation also resolved after enema and bowel regimen. I discussed case with Dr. Irene Limbo will follow up with patient in the office as scheduled. Orthostatic vital sign was negative prior to discharge, physical therapy also evaluated patient, who recommended home health PT.  Discharge Diagnoses:  Principal Problem:   Anemia Active Problems:   Myelodysplastic syndrome ([place])   Constipation  Constipation -Now resolved after soapsuds enema -Would recommend bowel regimen at home as patient is currently taking Percocet.  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 Irene Limbo, plans to follow up as an outpatient  Hemorrhoids -Topical cream when necessary  Discharge  Instructions  Discharge Instructions    Call MD for:  difficulty breathing, headache or visual disturbances    Complete by:  As directed    Call MD for:  extreme fatigue    Complete by:  As directed    Call MD for:  hives    Complete by:  As directed    Call MD for:  persistant dizziness or light-headedness    Complete by:  As directed    Call MD for:  persistant nausea and vomiting    Complete by:  As directed    Call MD for:  severe uncontrolled pain    Complete by:  As directed    Call MD for:  temperature >100.4    Complete by:  As directed    Diet - low sodium heart healthy    Complete by:  As directed    Discharge instructions    Complete by:  As directed    You were cared for by a hospitalist during your hospital stay. If you have any questions about your discharge medications or the care you received while you were in the hospital after you are discharged, you can call the unit and asked to speak with the hospitalist on call if the hospitalist that took care of you is not available. Once you are discharged, your primary care physician will handle any further medical issues. Please note that NO REFILLS for any discharge medications will be authorized once you are discharged, as it is imperative that you return to your primary care physician (or establish a relationship with a primary care physician if you do not have one) for your aftercare needs so  that they can reassess your need for medications and monitor your lab values.   Increase activity slowly    Complete by:  As directed      Allergies as of 06/23/2017      Reactions   Benadryl [diphenhydramine Hcl] Anxiety   MAKES HIM GO CRAZY   Hydrocodone Itching   Small amounts are okay      Medication List    TAKE these medications   acyclovir 400 MG tablet Commonly known as:  ZOVIRAX Take 1 tablet (400 mg total) by mouth daily.   fluconazole 200 MG tablet Commonly known as:  DIFLUCAN Take 1 tablet (200 mg total) by  mouth daily.   gabapentin 100 MG capsule Commonly known as:  NEURONTIN Take 200 mg by mouth 3 (three) times daily.   hydrocortisone 2.5 % rectal cream Commonly known as:  ANUSOL-HC Place rectally 4 (four) times daily.   LORazepam 0.5 MG tablet Commonly known as:  ATIVAN Take 1 mg by mouth at bedtime.   nortriptyline 25 MG capsule Commonly known as:  PAMELOR Take 1 capsule (25 mg total) by mouth at bedtime.   ondansetron 8 MG tablet Commonly known as:  ZOFRAN Take 1 tablet (8 mg total) by mouth 2 (two) times daily as needed (Nausea or vomiting).   oxyCODONE-acetaminophen 7.5-325 MG tablet Commonly known as:  PERCOCET Take 0.5-1 tablets by mouth every 8 (eight) hours as needed (pain.).   polyethylene glycol packet Commonly known as:  MIRALAX / GLYCOLAX Take 17 g by mouth 2 (two) times daily.   prochlorperazine 10 MG tablet Commonly known as:  COMPAZINE Take 1 tablet (10 mg total) by mouth every 6 (six) hours as needed (Nausea or vomiting).   senna-docusate 8.6-50 MG tablet Commonly known as:  Senokot-S Take 2 tablets by mouth 2 (two) times daily.   temazepam 15 MG capsule Commonly known as:  RESTORIL Take 15 mg by mouth at bedtime.   tetrahydrozoline-zinc 0.05-0.25 % ophthalmic solution Commonly known as:  VISINE-AC Place 1 drop into both eyes daily as needed (allergies).   triamcinolone cream 0.1 % Commonly known as:  KENALOG Apply 1 application topically every morning. APPLY TO CHEST AND BACK EVERY MORNING FOR [place] DISEASE            Discharge Care Instructions        Start     Ordered   06/23/17 0000  hydrocortisone (ANUSOL-HC) 2.5 % rectal cream  4 times daily     06/23/17 1410   06/23/17 0000  polyethylene glycol (MIRALAX / GLYCOLAX) packet  2 times daily     06/23/17 1410   06/23/17 0000  senna-docusate (SENOKOT-S) 8.6-50 MG tablet  2 times daily     06/23/17 1410   06/23/17 0000  Increase activity slowly     06/23/17 1410   06/23/17 0000   Diet - low sodium heart healthy     06/23/17 1410   06/23/17 0000  Discharge instructions    Comments:  You were cared for by a hospitalist during your hospital stay. If you have any questions about your discharge medications or the care you received while you were in the hospital after you are discharged, you can call the unit and asked to speak with the hospitalist on call if the hospitalist that took care of you is not available. Once you are discharged, your primary care physician will handle any further medical issues. Please note that NO REFILLS for any discharge medications will be authorized  once you are discharged, as it is imperative that you return to your primary care physician (or establish a relationship with a primary care physician if you do not have one) for your aftercare needs so that they can reassess your need for medications and monitor your lab values.   06/23/17 1410   06/23/17 0000  Call MD for:  temperature >100.4     06/23/17 1410   06/23/17 0000  Call MD for:  persistant nausea and vomiting     06/23/17 1410   06/23/17 0000  Call MD for:  severe uncontrolled pain     06/23/17 1410   06/23/17 0000  Call MD for:  extreme fatigue     06/23/17 1410   06/23/17 0000  Call MD for:  persistant dizziness or light-headedness     06/23/17 1410   06/23/17 0000  Call MD for:  hives     06/23/17 1410   06/23/17 0000  Call MD for:  difficulty breathing, headache or visual disturbances     06/23/17 1410     Follow-up Information    [name], MD. Schedule an appointment as soon as possible for a visit in 1 week(s).   Specialty:  Internal Medicine Contact information: [place] [address] [phone]        Brunetta Genera, MD. Go on 07/12/2017.   Specialties:  Hematology, Oncology Contact information: [address] [place] [address] [phone]          Allergies  Allergen Reactions  . Benadryl [Diphenhydramine Hcl]  Anxiety    MAKES HIM GO CRAZY  . Hydrocodone Itching    Small amounts are okay    Consultations:  None   Procedures/Studies: Dg Abdomen Acute W/chest  Result Date: 06/22/2017 CLINICAL DATA:  Lower abdominal pain for 1 week. EXAM: DG ABDOMEN ACUTE W/ 1V CHEST COMPARISON:  03/08/2016 FINDINGS: The abdominal gas pattern is negative for obstruction or perforation. Prostatic seed implants noted. No biliary or urinary calculi. The upright view of the chest is negative for acute cardiopulmonary abnormality and is unchanged from 03/08/2016. IMPRESSION: Negative abdominal radiographs.  No acute cardiopulmonary disease. Electronically Signed   By: Andreas Newport M.D.   On: 06/22/2017 03:13       Discharge Exam: Vitals:   06/22/17 2005 06/23/17 0406  BP: 124/71 127/76  Pulse: 88 94  Resp: 18 18  Temp: 98.4 F (36.9 C) 98.9 F (37.2 C)  SpO2: 100% 99%   Vitals:   06/22/17 1032 06/22/17 1420 06/22/17 2005 06/23/17 0406  BP: 116/70 114/71 124/71 127/76  Pulse: 82 96 88 94  Resp: 16 18 18 18   Temp: 99.5 F (37.5 C) 99.5 F (37.5 C) 98.4 F (36.9 C) 98.9 F (37.2 C)  TempSrc: Oral Oral Oral Oral  SpO2:  97% 100% 99%  Weight:      Height:        General: Pt is alert, awake, not in acute distress Cardiovascular: RRR, S1/S2 +, no rubs, no gallops Respiratory: CTA bilaterally, no wheezing, no rhonchi Abdominal: Soft, NT, ND, bowel sounds + Extremities: no edema, no cyanosis    The results of significant diagnostics from this hospitalization (including imaging, microbiology, ancillary and laboratory) are listed below for reference.     Microbiology: Recent Results (from the past 240 hour(s))  TECHNOLOGIST REVIEW     Status: None   Collection Time: 06/14/17 10:23 AM  Result Value Ref Range Status   Technologist Review   Final  Few Large & giant platelets, mod ovalos, few fragments and helmets     Labs: BNP (last 3 results) No results for input(s): BNP in the last  8760 hours. Basic Metabolic Panel:  Recent Labs Lab 06/22/17 0241 06/23/17 0323  NA 138 136  K 4.0 3.9  CL 103 101  CO2 29 30  GLUCOSE 117* 116*  BUN 18 16  CREATININE 1.05 1.16  CALCIUM 9.0 8.6*   Liver Function Tests:  Recent Labs Lab 06/22/17 0241  AST 20  ALT 18  ALKPHOS 45  BILITOT 0.5  PROT 7.0  ALBUMIN 4.1   No results for input(s): LIPASE, AMYLASE in the last 168 hours. No results for input(s): AMMONIA in the last 168 hours. CBC:  Recent Labs Lab 06/22/17 0241 06/23/17 0323  WBC 1.5* 1.4*  NEUTROABS 0.6* 0.6*  HGB 7.9* 9.6*  HCT 23.0* 27.8*  MCV 85.8 86.6  PLT 20* 19*   Cardiac Enzymes: No results for input(s): CKTOTAL, CKMB, CKMBINDEX, TROPONINI in the last 168 hours. BNP: Invalid input(s): POCBNP CBG: No results for input(s): GLUCAP in the last 168 hours. D-Dimer No results for input(s): DDIMER in the last 72 hours. Hgb A1c No results for input(s): HGBA1C in the last 72 hours. Lipid Profile No results for input(s): CHOL, HDL, LDLCALC, TRIG, CHOLHDL, LDLDIRECT in the last 72 hours. Thyroid function studies No results for input(s): TSH, T4TOTAL, T3FREE, THYROIDAB in the last 72 hours.  Invalid input(s): FREET3 Anemia work up No results for input(s): VITAMINB12, FOLATE, FERRITIN, TIBC, IRON, RETICCTPCT in the last 72 hours. Urinalysis    Component Value Date/Time   COLORURINE YELLOW (A) 06/22/2017 0538   APPEARANCEUR HAZY (A) 06/22/2017 0538   LABSPEC 1.010 06/22/2017 0538   PHURINE 8.0 06/22/2017 0538   GLUCOSEU NEGATIVE 06/22/2017 0538   HGBUR TRACE (A) 06/22/2017 0538   BILIRUBINUR NEGATIVE 06/22/2017 0538   KETONESUR NEGATIVE 06/22/2017 0538   PROTEINUR NEGATIVE 06/22/2017 0538   UROBILINOGEN 0.2 03/29/2015 0611   NITRITE NEGATIVE 06/22/2017 0538   LEUKOCYTESUR NEGATIVE 06/22/2017 0538   Sepsis Labs Invalid input(s): PROCALCITONIN,  WBC,  LACTICIDVEN Microbiology Recent Results (from the past 240 hour(s))  TECHNOLOGIST REVIEW      Status: None   Collection Time: 06/14/17 10:23 AM  Result Value Ref Range Status   Technologist Review   Final    Few Large & giant platelets, mod ovalos, few fragments and helmets     Time coordinating discharge: 40 minutes  SIGNED:  Dessa Phi, DO [facility] Pager [phone]  If 7PM-7AM, please contact night-coverage [email or web address] Password [email or web address] 06/23/2017, 2:11 PM

## 2017-06-23 NOTE — Progress Notes (Signed)
This CM met with pt at bedside about PT recommendations. Patient politely declines home health services at this time. Marney Doctor RN,BSN,NCM [phone]

## 2017-06-23 NOTE — Evaluation (Signed)
Physical Therapy Evaluation Patient Details Name: Jordan Terry MRN: [id number] DOB: 06-Jun-1932 Today's Date: 06/23/2017   History of Present Illness  83 year old male with myelodysplastic syndrome and chronic anemia/pancytopenia, followed by Dr. Irene Limbo, who came to the ED with constipation and was found to have worsening anemia.  He was ordered 2 units of packed red blood cell, enema, and was admitted to the hospital  Clinical Impression  Pt admitted with above diagnosis. Pt currently with functional limitations due to the deficits listed below (see PT Problem List). Pt ambulated 14'x2 without an assistive device, no loss of balance. Distance limited by nausea and incontinence of bowel. Will attempt to walk farther next visit.  Pt will benefit from skilled PT to increase their independence and safety with mobility to allow discharge to the venue listed below.       Follow Up Recommendations Home health PT    Equipment Recommendations  None recommended by PT    Recommendations for Other Services       Precautions / Restrictions Precautions Precautions: Fall Precaution Comments: pt denies h/o falls Restrictions Weight Bearing Restrictions: No      Mobility  Bed Mobility Overal bed mobility: Modified Independent             General bed mobility comments: with rail  Transfers Overall transfer level: Needs assistance Equipment used: None Transfers: Sit to/from Stand Sit to Stand: Min guard         General transfer comment: min/guard safety  Ambulation/Gait Ambulation/Gait assistance: Min guard Ambulation Distance (Feet): 28 Feet (14' x 2) Assistive device: None Gait Pattern/deviations: Step-through pattern;Trunk flexed   Gait velocity interpretation: at or above normal speed for age/gender General Gait Details: pt walked from bed to bathroom to bed, urgently needed to have BM, increased velocity but no LOB; pt reported nausea so no futher ambulation, assisted pt  back to bed  Stairs            Wheelchair Mobility    Modified Rankin (Stroke Patients Only)       Balance Overall balance assessment: Independent                                           Pertinent Vitals/Pain Pain Assessment: No/denies pain    Home Living Family/patient expects to be discharged to:: Private residence Living Arrangements: Spouse/significant other Available Help at Discharge: Family;Available 24 hours/day Type of Home: House Home Access: Stairs to enter Entrance Stairs-Rails: Right Entrance Stairs-Number of Steps: 2 Home Layout: One level Home Equipment: Cane - single point;Grab bars - toilet;Grab bars - tub/shower      Prior Function Level of Independence: Independent         Comments: has cane but doesn't use it, daughter drives pt to appointments     Hand Dominance        Extremity/Trunk Assessment   Upper Extremity Assessment Upper Extremity Assessment: Overall WFL for tasks assessed    Lower Extremity Assessment Lower Extremity Assessment: Overall WFL for tasks assessed    Cervical / Trunk Assessment Cervical / Trunk Assessment: Kyphotic  Communication   Communication: HOH  Cognition Arousal/Alertness: Awake/alert Behavior During Therapy: WFL for tasks assessed/performed Overall Cognitive Status: Within Functional Limits for tasks assessed  General Comments      Exercises     Assessment/Plan    PT Assessment Patient needs continued PT services  PT Problem List Decreased mobility;Decreased activity tolerance       PT Treatment Interventions Gait training;Functional mobility training;Therapeutic exercise    PT Goals (Current goals can be found in the Care Plan section)  Acute Rehab PT Goals Patient Stated Goal: return home PT Goal Formulation: With patient Time For Goal Achievement: 07/07/17 Potential to Achieve Goals: Good     Frequency Min 3X/week   Barriers to discharge        Co-evaluation               AM-PAC PT "6 Clicks" Daily Activity  Outcome Measure Difficulty turning over in bed (including adjusting bedclothes, sheets and blankets)?: None Difficulty moving from lying on back to sitting on the side of the bed? : A Little Difficulty sitting down on and standing up from a chair with arms (e.g., wheelchair, bedside commode, etc,.)?: A Little Help needed moving to and from a bed to chair (including a wheelchair)?: A Little Help needed walking in hospital room?: A Little Help needed climbing 3-5 steps with a railing? : A Little 6 Click Score: 19    End of Session   Activity Tolerance: Treatment limited secondary to medical complications (Comment) (nausea, frequent BM) Patient left: in bed;with call bell/phone within reach;with nursing/sitter in room Nurse Communication: Mobility status PT Visit Diagnosis: Difficulty in walking, not elsewhere classified (R26.2)    Time: 6387-5643 PT Time Calculation (min) (ACUTE ONLY): 19 min   Charges:   PT Evaluation $PT Eval Low Complexity: 1 Low     PT G Codes:   PT G-Codes **NOT FOR INPATIENT CLASS** Functional Assessment Tool Used: AM-PAC 6 Clicks Basic Mobility Functional Limitation: Mobility: Walking and moving around Mobility: Walking and Moving Around Current Status (P2951): At least 20 percent but less than 40 percent impaired, limited or restricted Mobility: Walking and Moving Around Goal Status [phone]): At least 1 percent but less than 20 percent impaired, limited or restricted      Jordan Terry 06/23/2017, 1:56 PM [phone]

## 2017-06-23 NOTE — Progress Notes (Signed)
CRITICAL VALUE ALERT  Critical Value: wbc 1.4 Date & Time Notied:  06/23/17 0411  Provider Notified: K.Schorr  Orders Received/Actions taken: None

## 2017-06-23 NOTE — Care Management Obs Status (Signed)
[facility] NOTIFICATION   Patient Details  Name: YAW ESCOTO MRN: [id number] Date of Birth: 1931-11-11   Medicare Observation Status Notification Given:  Yes    Lynnell Catalan, RN 06/23/2017, 10:12 AM

## 2017-06-24 ENCOUNTER — Telehealth: Payer: Self-pay | Admitting: *Deleted

## 2017-06-24 NOTE — Telephone Encounter (Signed)
Pt called stating he was d/c'd from hospital yesterday.  Pt was given miralax and senna to take daily.  Pt stating he is now having very watery stools.  Instructed pt to stop stool softeners/miralax.  Instructed pt only to take those medications if he is experiencing constipation.  Pt verbalized understanding/thankful for call.

## 2017-06-29 ENCOUNTER — Other Ambulatory Visit: Payer: Self-pay

## 2017-06-29 ENCOUNTER — Other Ambulatory Visit (HOSPITAL_BASED_OUTPATIENT_CLINIC_OR_DEPARTMENT_OTHER): Payer: Medicare Other

## 2017-06-29 ENCOUNTER — Telehealth: Payer: Self-pay

## 2017-06-29 DIAGNOSIS — D61818 Other pancytopenia: Secondary | ICD-10-CM | POA: Diagnosis not present

## 2017-06-29 DIAGNOSIS — D469 Myelodysplastic syndrome, unspecified: Secondary | ICD-10-CM

## 2017-06-29 DIAGNOSIS — D4620 Refractory anemia with excess of blasts, unspecified: Secondary | ICD-10-CM | POA: Diagnosis not present

## 2017-06-29 DIAGNOSIS — D696 Thrombocytopenia, unspecified: Secondary | ICD-10-CM

## 2017-06-29 LAB — CBC & DIFF AND RETIC
BASO%: 1.3 % (ref 0.0–2.0)
BASOS ABS: 0 10*3/uL (ref 0.0–0.1)
EOS ABS: 0 10*3/uL (ref 0.0–0.5)
EOS%: 0.9 % (ref 0.0–7.0)
HCT: 30.6 % — ABNORMAL LOW (ref 38.4–49.9)
HEMOGLOBIN: 10.2 g/dL — AB (ref 13.0–17.1)
IMMATURE RETIC FRACT: 10.1 % (ref 3.00–10.60)
LYMPH#: 1.3 10*3/uL (ref 0.9–3.3)
LYMPH%: 68.2 % — AB (ref 14.0–49.0)
MCH: 29.5 pg (ref 27.2–33.4)
MCHC: 33.2 g/dL (ref 32.0–36.0)
MCV: 88.9 fL (ref 79.3–98.0)
MONO#: 0.1 10*3/uL (ref 0.1–0.9)
MONO%: 4.1 % (ref 0.0–14.0)
NEUT#: 0.5 10*3/uL — CL (ref 1.5–6.5)
NEUT%: 25.5 % — AB (ref 39.0–75.0)
RBC: 3.45 10*6/uL — AB (ref 4.20–5.82)
RDW: 17.4 % — ABNORMAL HIGH (ref 11.0–14.6)
RETIC CT ABS: 13.46 10*3/uL — AB (ref 34.80–93.90)
Retic %: 0.39 % — ABNORMAL LOW (ref 0.80–1.80)
WBC: 1.9 10*3/uL — ABNORMAL LOW (ref 4.0–10.3)

## 2017-06-29 NOTE — Telephone Encounter (Signed)
Pt experiencing blood in his urine overnight x 3. Will communicate to infusion charge this afternoon to see if pt can receive platelet transfusion tomorrow. Hgb 10.2 and Plt 17.

## 2017-06-29 NOTE — Telephone Encounter (Signed)
Pt to come in tomorrow morning at 0800 to register for 0815 appt to receive 1U platelets. Lab to send blood tubes if pt never received platelets before. Blood Bank notified - Manuela Schwartz aware and blood type on file (platelets to be irradiated). No HAR needed for platelets. Orders placed for one unit and 650mg  tylenol. No benadryl because causes pt anxiety. Confirmed all with Erline Levine, RN.

## 2017-06-30 ENCOUNTER — Ambulatory Visit (HOSPITAL_BASED_OUTPATIENT_CLINIC_OR_DEPARTMENT_OTHER): Payer: Medicare Other

## 2017-06-30 DIAGNOSIS — D61818 Other pancytopenia: Secondary | ICD-10-CM | POA: Diagnosis not present

## 2017-06-30 DIAGNOSIS — D4620 Refractory anemia with excess of blasts, unspecified: Secondary | ICD-10-CM | POA: Diagnosis not present

## 2017-06-30 DIAGNOSIS — D469 Myelodysplastic syndrome, unspecified: Secondary | ICD-10-CM | POA: Diagnosis not present

## 2017-06-30 DIAGNOSIS — D696 Thrombocytopenia, unspecified: Secondary | ICD-10-CM

## 2017-06-30 MED ORDER — SODIUM CHLORIDE 0.9 % IV SOLN
250.0000 mL | Freq: Once | INTRAVENOUS | Status: AC
  Administered 2017-06-30: 250 mL via INTRAVENOUS

## 2017-06-30 MED ORDER — ACETAMINOPHEN 325 MG PO TABS: 650.0000 mg | ORAL_TABLET | Freq: Once | ORAL | Status: DC

## 2017-06-30 NOTE — Patient Instructions (Signed)
Platelet Transfusion A platelet transfusion is a procedure in which you receive donated platelets through an IV tube. Platelets are tiny pieces of blood cells. When a blood vessel is damaged, platelets collect in the damaged area to help form a blood clot. This begins the healing process. If your platelet count gets too low, your blood may have trouble clotting. You may need a platelet transfusion if you have a condition that causes a low number of platelets (thrombocytopenia). A platelet transfusion may be used to stop or prevent bleeding. Tell a health care provider about:  Any allergies you have.  All medicines you are taking, including vitamins, herbs, eye drops, creams, and over-the-counter medicines.  Any problems you or family members have had with anesthetic medicines.  Any blood disorders you have.  Any surgeries you have had.  Any medical conditions you have.  Any reactions you have had during a previous transfusion. What are the risks? Generally, this is a safe procedure. However, problems may occur, including:  Fever with or without chills. The fever usually occurs within the first 4 hours of the transfusion and returns to normal within 48 hours.  Allergic reaction. The reaction is most commonly caused by antibodies your body creates against substances in the transfusion. Signs of an allergic reaction may include itching, hives, difficulty breathing, shock, or low blood pressure.  Sudden (acute) or delayed hemolytic reaction. This rare reaction can occur during the transfusion and up to 28 days after the transfusion. The reaction usually occurs when your body's defense system (immune system) attacks the new platelets. Signs of a hemolytic reaction may include fever, headache, difficulty breathing, low blood pressure, a rapid heartbeat, or pain in your back, abdomen, chest, or IV site.  Transfusion-related acute lung injury (TRALI). TRALI can occur within hours of a transfusion,  or several days later. This is a rare reaction that causes lung damage. The cause is not known.  Infection. Signs of this rare complication may include fever, chills, vomiting, a rapid heartbeat, or low blood pressure.  What happens before the procedure?  You may have a blood test to determine your blood type. This is necessary to find out what kind ofplatelets best matches your platelets.  If you have had an allergic reaction to a transfusion in the past, you may be given medicine to help prevent a reaction. Take this medicine only as directed by your health care provider.  Your temperature, blood pressure, and pulse will be monitored before the transfusion. What happens during the procedure?  An IV will be started in your hand or arm.  The transfusion will be attached to your IV tubing. The bag of donated platelets will be attached to your IV tube andgiven into your vein.  Your temperature, blood pressure, and pulse will be monitored regularly during the transfusion. This monitoring is done to help detect early signs of a transfusion reaction.  If you have any signs or symptoms of a reaction, your transfusion will be stopped and you may be given medicine.  When your transfusion is complete, your IV will be removed.  Pressure may be applied to the IV site for a few minutes.  A bandage (dressing) will be applied. The procedure may vary among health care providers and hospitals. What happens after the procedure?  Your blood pressure, temperature, and pulse will be monitored regularly. This information is not intended to replace advice given to you by your health care provider. Make sure you discuss any questions you have   with your health care provider. Document Released: 08/09/2007 Document Revised: 03/19/2016 Document Reviewed: 08/22/2014 Elsevier Interactive Patient Education  2018 [employer]  

## 2017-07-01 LAB — BPAM PLATELET PHERESIS
BLOOD PRODUCT EXPIRATION DATE: 201809072359
ISSUE DATE / TIME: 201809050838
UNIT TYPE AND RH: 6200

## 2017-07-01 LAB — PREPARE PLATELET PHERESIS: Unit division: 0

## 2017-07-05 ENCOUNTER — Other Ambulatory Visit (HOSPITAL_BASED_OUTPATIENT_CLINIC_OR_DEPARTMENT_OTHER): Payer: Medicare Other

## 2017-07-05 DIAGNOSIS — D61818 Other pancytopenia: Secondary | ICD-10-CM

## 2017-07-05 DIAGNOSIS — D4620 Refractory anemia with excess of blasts, unspecified: Secondary | ICD-10-CM

## 2017-07-05 DIAGNOSIS — D469 Myelodysplastic syndrome, unspecified: Secondary | ICD-10-CM

## 2017-07-05 LAB — CBC & DIFF AND RETIC
BASO%: 0 % (ref 0.0–2.0)
BASOS ABS: 0 10*3/uL (ref 0.0–0.1)
EOS%: 0.5 % (ref 0.0–7.0)
Eosinophils Absolute: 0 10*3/uL (ref 0.0–0.5)
HEMATOCRIT: 27.7 % — AB (ref 38.4–49.9)
HGB: 9.2 g/dL — ABNORMAL LOW (ref 13.0–17.1)
Immature Retic Fract: 16 % — ABNORMAL HIGH (ref 3.00–10.60)
LYMPH#: 1 10*3/uL (ref 0.9–3.3)
LYMPH%: 54.4 % — ABNORMAL HIGH (ref 14.0–49.0)
MCH: 29.6 pg (ref 27.2–33.4)
MCHC: 33.2 g/dL (ref 32.0–36.0)
MCV: 89.1 fL (ref 79.3–98.0)
MONO#: 0.1 10*3/uL (ref 0.1–0.9)
MONO%: 3.8 % (ref 0.0–14.0)
NEUT#: 0.8 10*3/uL — ABNORMAL LOW (ref 1.5–6.5)
NEUT%: 41.3 % (ref 39.0–75.0)
PLATELETS: 17 10*3/uL — AB (ref 140–400)
RBC: 3.11 10*6/uL — AB (ref 4.20–5.82)
RDW: 17.3 % — AB (ref 11.0–14.6)
RETIC CT ABS: 16.17 10*3/uL — AB (ref 34.80–93.90)
Retic %: 0.52 % — ABNORMAL LOW (ref 0.80–1.80)
WBC: 1.8 10*3/uL — ABNORMAL LOW (ref 4.0–10.3)

## 2017-07-05 LAB — TECHNOLOGIST REVIEW

## 2017-07-06 ENCOUNTER — Telehealth: Payer: Self-pay | Admitting: *Deleted

## 2017-07-06 ENCOUNTER — Other Ambulatory Visit: Payer: Self-pay

## 2017-07-06 MED ORDER — FLUCONAZOLE 200 MG PO TABS: 200.0000 mg | ORAL_TABLET | Freq: Every day | ORAL | 0 refills | Status: DC

## 2017-07-06 NOTE — Telephone Encounter (Signed)
Call from pt reporting constipation. Last BM 9/8. Pt has Miralax in the home with directions that read take BID. He also has Senna-S. Took 2 this AM. Instructed pt to take Miralax BID followed by a warm drink to stimulate BM. Once bowels begin moving titrate laxatives as tolerated. He wrote down instructions and read back to this RN.

## 2017-07-12 ENCOUNTER — Other Ambulatory Visit (HOSPITAL_BASED_OUTPATIENT_CLINIC_OR_DEPARTMENT_OTHER): Payer: Medicare Other

## 2017-07-12 ENCOUNTER — Ambulatory Visit: Payer: Medicare Other

## 2017-07-12 ENCOUNTER — Ambulatory Visit (HOSPITAL_BASED_OUTPATIENT_CLINIC_OR_DEPARTMENT_OTHER): Payer: Medicare Other

## 2017-07-12 ENCOUNTER — Ambulatory Visit (HOSPITAL_BASED_OUTPATIENT_CLINIC_OR_DEPARTMENT_OTHER): Payer: Medicare Other | Admitting: Hematology

## 2017-07-12 ENCOUNTER — Other Ambulatory Visit: Payer: Medicare Other

## 2017-07-12 VITALS — BP 109/63 | HR 85 | Temp 98.4°F | Resp 18 | Ht 74.0 in | Wt 161.3 lb

## 2017-07-12 DIAGNOSIS — D469 Myelodysplastic syndrome, unspecified: Secondary | ICD-10-CM

## 2017-07-12 DIAGNOSIS — D696 Thrombocytopenia, unspecified: Secondary | ICD-10-CM

## 2017-07-12 DIAGNOSIS — D4620 Refractory anemia with excess of blasts, unspecified: Secondary | ICD-10-CM

## 2017-07-12 DIAGNOSIS — D61818 Other pancytopenia: Secondary | ICD-10-CM

## 2017-07-12 DIAGNOSIS — Z5111 Encounter for antineoplastic chemotherapy: Secondary | ICD-10-CM

## 2017-07-12 LAB — COMPREHENSIVE METABOLIC PANEL
ALBUMIN: 3.8 g/dL (ref 3.5–5.0)
ALK PHOS: 55 U/L (ref 40–150)
ALT: 25 U/L (ref 0–55)
AST: 19 U/L (ref 5–34)
Anion Gap: 8 mEq/L (ref 3–11)
BILIRUBIN TOTAL: 0.43 mg/dL (ref 0.20–1.20)
BUN: 18.1 mg/dL (ref 7.0–26.0)
CO2: 28 meq/L (ref 22–29)
Calcium: 9.4 mg/dL (ref 8.4–10.4)
Chloride: 105 mEq/L (ref 98–109)
Creatinine: 1 mg/dL (ref 0.7–1.3)
EGFR: 69 mL/min/{1.73_m2} — ABNORMAL LOW (ref >90)
GLUCOSE: 96 mg/dL (ref 70–140)
Potassium: 4.1 mEq/L (ref 3.5–5.1)
SODIUM: 140 meq/L (ref 136–145)
TOTAL PROTEIN: 7.3 g/dL (ref 6.4–8.3)

## 2017-07-12 LAB — CBC & DIFF AND RETIC
BASO%: 0 % (ref 0.0–2.0)
Basophils Absolute: 0 10*3/uL (ref 0.0–0.1)
EOS ABS: 0 10*3/uL (ref 0.0–0.5)
EOS%: 1.3 % (ref 0.0–7.0)
HCT: 26.8 % — ABNORMAL LOW (ref 38.4–49.9)
HEMOGLOBIN: 8.8 g/dL — AB (ref 13.0–17.1)
IMMATURE RETIC FRACT: 15.1 % — AB (ref 3.00–10.60)
LYMPH%: 67.8 % — AB (ref 14.0–49.0)
MCH: 29.4 pg (ref 27.2–33.4)
MCHC: 32.8 g/dL (ref 32.0–36.0)
MCV: 89.6 fL (ref 79.3–98.0)
MONO#: 0 10*3/uL — ABNORMAL LOW (ref 0.1–0.9)
MONO%: 1.3 % (ref 0.0–14.0)
NEUT%: 29.6 % — ABNORMAL LOW (ref 39.0–75.0)
NEUTROS ABS: 0.4 10*3/uL — AB (ref 1.5–6.5)
Platelets: 30 10*3/uL — ABNORMAL LOW (ref 140–400)
RBC: 2.99 10*6/uL — AB (ref 4.20–5.82)
RDW: 18 % — ABNORMAL HIGH (ref 11.0–14.6)
RETIC CT ABS: 31.69 10*3/uL — AB (ref 34.80–93.90)
Retic %: 1.06 % (ref 0.80–1.80)
WBC: 1.5 10*3/uL — AB (ref 4.0–10.3)
lymph#: 1 10*3/uL (ref 0.9–3.3)

## 2017-07-12 MED ORDER — ONDANSETRON HCL 8 MG PO TABS: 8.0000 mg | ORAL_TABLET | Freq: Once | ORAL | Status: DC

## 2017-07-12 MED ORDER — AZACITIDINE CHEMO SQ INJECTION
50.0000 mg/m2 | Freq: Once | INTRAMUSCULAR | Status: AC
  Administered 2017-07-12: 100 mg via SUBCUTANEOUS
  Filled 2017-07-12: qty 4

## 2017-07-12 NOTE — Progress Notes (Signed)
. .    HEMATOLOGY/ONCOLOGY CLINIC NOTE  Date of Service: .07/12/2017  Patient Care Team: [name], MD as PCP - General (Internal Medicine) Norma Fredrickson, MD as Consulting Physician (Psychiatry)  CHIEF COMPLAINTS/PURPOSE OF CONSULTATION:  F/u for MDS   HISTORY OF PRESENTING ILLNESS:   Jordan Terry is a wonderful 83 y.o. male who has been referred to [place] by Dr .[name], MD/ Dr Harriett Sine MD for evaluation and management of pancytopenia.  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  He recently transferred his dermatology care to Dr. Elvera Lennox in [place] and had recent labs on 03/29/2017 which showed pancytopenia with a hemoglobin of 10.7 with an MCV of 82.5, platelet count of 45k , WBC count of 2.1k with an [place] of 924.  He reports that he has been off methotrexate for 2 months. Notes no other new medications. No recent symptoms suggestive of a viral infection. No overt fevers chills or night sweats. Does note some fatigue. Denies any previous history of liver disease. No recent infections requiring antibiotics. No new focal bone pain.   INTERVAL HISTORY  Mr Gautreau is here for his scheduled f/u prior to 3rd cycle of Vidaza. He did require platelet transfusion for platelets of 17k with some hematuria and received a platelet transfusion. No further hematuria at this time. Has some petechiae..  No fevers/chills/night sweats/CP/SOB or other evidence of bleeding. No issues with infection. Some fatigue - unchanged. Has been having ongoing issues with anxiety to cause nausea. He is already on antianxiety medications as per his psychiatrist and was recommended continued discussion with psychiatry to optimize treatment of his anxiety.   MEDICAL HISTORY:    Past Medical History:  Diagnosis Date  . Anginal pain ([place]) 20 years ago  . Anxiety   . Arthritis   . Bilateral tinnitus    wears hearing aides  . Cancer [facility])    prostate  . Grover's disease   . Headache(784.0)    not since retired  . Hematuria 04/14/2017  . Pancytopenia ([place]) 04/19/2017  . Self-catheterizes urinary bladder 04/19/2017  . Vertigo     SURGICAL HISTORY: Past Surgical History:  Procedure Laterality Date  . CARDIAC CATHETERIZATION  "20 years ago"  . CARPAL TUNNEL RELEASE Left 2008  . CATARACT EXTRACTION Bilateral 2014  . CYSTOSCOPY WITH URETHRAL DILATATION N/A 08/14/2013   Procedure: CYSTOSCOPY WITH URETHRAL DILATATION BALLOON DILATION OF URETHRAL STRICTURE;  Surgeon: Dutch Gray, MD;  Location: [facility];  Service: Urology;  Laterality: N/A;  BALLOON DILATION   . CYSTOSCOPY WITH URETHRAL DILATATION N/A 05/07/2014   Procedure: CYSTOSCOPY WITH BALLOON DILATION OF URETHRAL STRICTURE;  Surgeon: Raynelle Bring, MD;  Location: [facility];  Service: Urology;  Laterality: N/A;  [place]   "scope"  . NOSE SURGERY  1991, 1998  . RADIOACTIVE SEED IMPLANT  2002   "for prostate cancer"  . SHOULDER SURGERY  2012   right  . THYROIDECTOMY, PARTIAL  1974  . VASECTOMY  1970    SOCIAL HISTORY: Social History   Social History  . Marital status: Married    Spouse name: [name]  . Number of children: 3  . Years of education: college   Occupational History  . Not on file.  Social History Main Topics  . Smoking status: Former Smoker    Years: 10.00    Types: Cigars    Quit date: 1979  . Smokeless tobacco: Never Used  . Alcohol use No  . Drug use: No  . Sexual activity: Not on file   Other Topics Concern  . Not on file   Social History Narrative   Patient is married [name]) and lives at home with his wife.   Patient has three children.   Patient is retired.   Patient has a college education in [place].    Patient is right-handed.   Patient drinks one cup of coffee daily and one 8 oz of tea daily.    FAMILY HISTORY: Family History  Problem Relation Age of Onset  . Anxiety disorder Mother   . Cancer Father        colon, lung  . Diabetes Brother   . Cancer Brother   . Diabetes Sister   . Leukemia Sister   . Cancer Sister        breast  . Cancer Sister        breast  . Cancer Sister        thyroid  . Diabetes Sister     ALLERGIES:  is allergic to benadryl [diphenhydramine hcl] and hydrocodone.  MEDICATIONS:  Current Outpatient Prescriptions  Medication Sig Dispense Refill  . acyclovir (ZOVIRAX) 400 MG tablet Take 1 tablet (400 mg total) by mouth daily. 30 tablet 2  . fluconazole (DIFLUCAN) 200 MG tablet Take 1 tablet (200 mg total) by mouth daily. 30 tablet 0  . gabapentin (NEURONTIN) 100 MG capsule Take 200 mg by mouth 3 (three) times daily.     . hydrocortisone (ANUSOL-HC) 2.5 % rectal cream Place rectally 4 (four) times daily. 30 g 0  . LORazepam (ATIVAN) 0.5 MG tablet Take 1 mg by mouth at bedtime.    . nortriptyline (PAMELOR) 25 MG capsule Take 1 capsule (25 mg total) by mouth at bedtime. 90 capsule 1  . ondansetron (ZOFRAN) 8 MG tablet Take 1 tablet (8 mg total) by mouth 2 (two) times daily as needed (Nausea or vomiting). 30 tablet 1  . oxyCODONE-acetaminophen (PERCOCET) 7.5-325 MG per tablet Take 0.5-1 tablets by mouth every 8 (eight) hours as needed (pain.).     Marland Kitchen polyethylene glycol (MIRALAX / GLYCOLAX) packet Take 17 g by mouth 2 (two) times daily. 14 each 0  . prochlorperazine (COMPAZINE) 10 MG tablet Take 1 tablet (10 mg total) by mouth every 6 (six) hours as needed (Nausea or vomiting). 30 tablet 1  . senna-docusate (SENOKOT-S) 8.6-50 MG tablet Take 2 tablets by mouth 2 (two) times daily. 30 tablet 0  . temazepam (RESTORIL) 15 MG capsule Take 15 mg by mouth at bedtime.    Marland Kitchen tetrahydrozoline-zinc (VISINE-AC) 0.05-0.25 % ophthalmic solution Place 1 drop into both eyes  daily as needed (allergies).    . triamcinolone cream (KENALOG) 0.1 % Apply 1 application topically every morning. APPLY TO CHEST AND BACK EVERY MORNING FOR GROVER DISEASE     No current facility-administered medications for this visit.    Facility-Administered Medications Ordered in Other Visits  Medication Dose Route Frequency Provider Last Rate Last Dose  . ondansetron (ZOFRAN) tablet 8 mg  8 mg Oral Once Brunetta Genera, MD        REVIEW OF SYSTEMS:    10 Point review of Systems was done is negative except as noted above.  PHYSICAL EXAMINATION: ECOG PERFORMANCE STATUS:  2 - Symptomatic, <50% confined to bed  . Vitals:   07/12/17 1316  BP: 109/63  Pulse: 85  Resp: 18  Temp: 98.4 F (36.9 C)  SpO2: 100%   Filed Weights   07/12/17 1316  Weight: 161 lb 4.8 oz (73.2 kg)   .Body mass index is 20.71 kg/m.  GENERAL:alert, in no acute distress and comfortable SKIN: no acute rashes, Noted to have petechiae on his lower extremities EYES: conjunctiva are pink and non-injected, sclera anicteric OROPHARYNX: MMM, no exudates, no oropharyngeal erythema or ulceration NECK: supple, no JVD LYMPH:  no palpable lymphadenopathy in the cervical, axillary or inguinal regions LUNGS: clear to auscultation b/l with normal respiratory effort HEART: regular rate & rhythm  ABDOMEN:  normoactive bowel sounds , non tender, not distended. Borderline palpable spleen no overt palpable hepatomegaly. Extremity: no pedal edema PSYCH: alert & oriented x 3 with fluent speech NEURO: no focal motor/sensory deficits  LABORATORY DATA:  I have reviewed the data as listed  .Marland Kitchen CBC Latest Ref Rng & Units 07/12/2017 07/05/2017 06/29/2017  WBC 4.0 - 10.3 10e3/uL 1.5(L) 1.8(L) 1.9(L)  Hemoglobin 13.0 - 17.1 g/dL 8.8(L) 9.2(L) 10.2(L)  Hematocrit 38.4 - 49.9 % 26.8(L) 27.7(L) 30.6(L)  Platelets 140 - 400 10e3/uL 30(L) 17(L) 17 occ large plts seen(L)   ANC 400 . CBC    Component Value Date/Time   WBC  1.5 (L) 07/12/2017 1254   WBC 1.4 (LL) 06/23/2017 0323   RBC 2.99 (L) 07/12/2017 1254   RBC 3.21 (L) 06/23/2017 0323   HGB 8.8 (L) 07/12/2017 1254   HCT 26.8 (L) 07/12/2017 1254   PLT 30 (L) 07/12/2017 1254   MCV 89.6 07/12/2017 1254   MCH 29.4 07/12/2017 1254   MCH 29.9 06/23/2017 0323   MCHC 32.8 07/12/2017 1254   MCHC 34.5 06/23/2017 0323   RDW 18.0 (H) 07/12/2017 1254   LYMPHSABS 1.0 07/12/2017 1254   MONOABS 0.0 (L) 07/12/2017 1254   EOSABS 0.0 07/12/2017 1254   BASOSABS 0.0 07/12/2017 1254    . CMP Latest Ref Rng & Units 07/12/2017 06/23/2017 06/22/2017  Glucose 70 - 140 mg/dl 96 116(H) 117(H)  BUN 7.0 - 26.0 mg/dL 18.1 16 18   Creatinine 0.7 - 1.3 mg/dL 1.0 1.16 1.05  Sodium 136 - 145 mEq/L 140 136 138  Potassium 3.5 - 5.1 mEq/L 4.1 3.9 4.0  Chloride 101 - 111 mmol/L - 101 103  CO2 22 - 29 mEq/L 28 30 29   Calcium 8.4 - 10.4 mg/dL 9.4 8.6(L) 9.0  Total Protein 6.4 - 8.3 g/dL 7.3 - 7.0  Total Bilirubin 0.20 - 1.20 mg/dL 0.43 - 0.5  Alkaline Phos 40 - 150 U/L 55 - 45  AST 5 - 34 U/L 19 - 20  ALT 0 - 55 U/L 25 - 18         RADIOGRAPHIC STUDIES: I have personally reviewed the radiological images as listed and agreed with the findings in the report. Dg Abdomen Acute W/chest  Result Date: 06/22/2017 CLINICAL DATA:  Lower abdominal pain for 1 week. EXAM: DG ABDOMEN ACUTE W/ 1V CHEST COMPARISON:  03/08/2016 FINDINGS: The abdominal gas pattern is negative for obstruction or perforation. Prostatic seed implants noted. No biliary or urinary calculi. The upright view of the chest is negative for acute cardiopulmonary abnormality and is unchanged from 03/08/2016. IMPRESSION: Negative abdominal radiographs.  No acute cardiopulmonary disease. Electronically Signed   By: Andreas Newport M.D.   On: 06/22/2017 03:13     CT chest/abd/pelvis 04/20/2017 IMPRESSION:  Diffuse cystitis involving the urinary bladder. Foley catheter in place.  No evidence of lymphadenopathy or  metastatic disease within the chest, abdomen, or pelvis.  2.6 cm left thyroid lobe nodule. Consider thyroid ultrasound for further evaluation. This recommendation follows ACR consensus guidelines: Managing Incidental Thyroid Nodules Detected on Imaging: White Paper of the ACR Incidental Thyroid Findings Committee. J Am Coll Radiol 2015;12(2):143-150.  Colonic diverticulosis. No radiographic evidence of diverticulitis.  Aortic atherosclerosis.   Electronically Signed   By: Earle Gell M.D.   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 HIV nonreactive Hepatitis C negative LDH within normal limits at 191  #2 thrombocytopenia platelet count up from 17k to 30k. Required one apheresis unit of transfusion for hematuria and platelets of 17k.  #3 Neutropenia WBC count of 1.5 k with an ANC of 0.4k -continue Acyclovir and fluconazole for prophylaxis.  #4 normocytic anemia hemoglobin of 8.8  PLAN - given counts we will continue Vidaza at 50mg /m2 for 5 days - Patient will proceed with his current cycle 3 of Vidaza. -Continue weekly labs -will schedule cycle 3 and 4 of Vidaza -transfuse plt prnfor PLT<10k or if bleeding -transfuse prbc prn for hgb <8 or if symptomatic (CMV neg irradiated blood products) -continue fluconazole and acyclovir for prophylaxis. -Return to clinic with Dr. Irene Limbo cycle 4 day 1  #5 atypical lymphocytes noted on peripheral blood smear - monoclonal B lymphocytosis  CT c/a/p with no evidence of lymphadenopathy/hepatosplenomegaly. -no intervention at this time. Monitor   Schedule next 2 cycles of Vidaza -continue weekly labs RTC with Dr Irene Limbo in 4 weeks with C4D1 of treatment   All of the patients questions were answered with apparent satisfaction. The patient knows to call  the clinic with any problems, questions or concerns.  I spent 20 minutes counseling the patient face to face. The total time spent in the appointment was 25 minutes and more than 50% was on counseling and direct patient cares.    Sullivan Lone MD [place] AAHIVMS [facility] Hematology/Oncology Physician [facility]  (Office):       [phone] (Work cell):  [phone] (Fax):           [phone]

## 2017-07-12 NOTE — Patient Instructions (Signed)
[facility] Discharge Instructions for Patients Receiving Chemotherapy  Today you received the following chemotherapy agents Vidaza  To help prevent nausea and vomiting after your treatment, we encourage you to take your nausea medication as prescribed.    If you develop nausea and vomiting that is not controlled by your nausea medication, call the clinic.   BELOW ARE SYMPTOMS THAT SHOULD BE REPORTED IMMEDIATELY:  *FEVER GREATER THAN 100.5 F  *CHILLS WITH OR WITHOUT FEVER  NAUSEA AND VOMITING THAT IS NOT CONTROLLED WITH YOUR NAUSEA MEDICATION  *UNUSUAL SHORTNESS OF BREATH  *UNUSUAL BRUISING OR BLEEDING  TENDERNESS IN MOUTH AND THROAT WITH OR WITHOUT PRESENCE OF ULCERS  *URINARY PROBLEMS  *BOWEL PROBLEMS  UNUSUAL RASH Items with * indicate a potential emergency and should be followed up as soon as possible.  Feel free to call the clinic you have any questions or concerns. The clinic phone number is [phone].  Please show the CHEMO ALERT CARD at check-in to the Emergency Department and triage nurse.   

## 2017-07-12 NOTE — Progress Notes (Signed)
Ok to treat with ANC 0.4 and Platelets 30 per Dr. Irene [place].

## 2017-07-13 ENCOUNTER — Telehealth: Payer: Self-pay | Admitting: Hematology

## 2017-07-13 ENCOUNTER — Ambulatory Visit (HOSPITAL_BASED_OUTPATIENT_CLINIC_OR_DEPARTMENT_OTHER): Payer: Medicare Other

## 2017-07-13 VITALS — BP 128/72 | HR 72 | Temp 98.6°F | Resp 20

## 2017-07-13 DIAGNOSIS — Z5111 Encounter for antineoplastic chemotherapy: Secondary | ICD-10-CM | POA: Diagnosis not present

## 2017-07-13 DIAGNOSIS — D469 Myelodysplastic syndrome, unspecified: Secondary | ICD-10-CM

## 2017-07-13 DIAGNOSIS — D4620 Refractory anemia with excess of blasts, unspecified: Secondary | ICD-10-CM

## 2017-07-13 MED ORDER — ONDANSETRON HCL 8 MG PO TABS
ORAL_TABLET | ORAL | Status: AC
  Filled 2017-07-13: qty 1

## 2017-07-13 MED ORDER — AZACITIDINE CHEMO SQ INJECTION
50.0000 mg/m2 | Freq: Once | INTRAMUSCULAR | Status: AC
  Administered 2017-07-13: 100 mg via SUBCUTANEOUS
  Filled 2017-07-13: qty 4

## 2017-07-13 MED ORDER — ONDANSETRON HCL 8 MG PO TABS
8.0000 mg | ORAL_TABLET | Freq: Once | ORAL | Status: AC
  Administered 2017-07-13: 8 mg via ORAL

## 2017-07-13 NOTE — Patient Instructions (Signed)
[facility] Discharge Instructions for Patients Receiving Chemotherapy  Today you received the following chemotherapy agents: Vidaza   To help prevent nausea and vomiting after your treatment, we encourage you to take your nausea medication as directed.    If you develop nausea and vomiting that is not controlled by your nausea medication, call the clinic.   BELOW ARE SYMPTOMS THAT SHOULD BE REPORTED IMMEDIATELY:  *FEVER GREATER THAN 100.5 F  *CHILLS WITH OR WITHOUT FEVER  NAUSEA AND VOMITING THAT IS NOT CONTROLLED WITH YOUR NAUSEA MEDICATION  *UNUSUAL SHORTNESS OF BREATH  *UNUSUAL BRUISING OR BLEEDING  TENDERNESS IN MOUTH AND THROAT WITH OR WITHOUT PRESENCE OF ULCERS  *URINARY PROBLEMS  *BOWEL PROBLEMS  UNUSUAL RASH Items with * indicate a potential emergency and should be followed up as soon as possible.  Feel free to call the clinic you have any questions or concerns. The clinic phone number is [phone].  Please show the CHEMO ALERT CARD at check-in to the Emergency Department and triage nurse.   

## 2017-07-13 NOTE — Telephone Encounter (Signed)
No additional appts added per 9/17 los - appts already scheduled.

## 2017-07-14 ENCOUNTER — Other Ambulatory Visit: Payer: Self-pay | Admitting: *Deleted

## 2017-07-14 ENCOUNTER — Ambulatory Visit (HOSPITAL_BASED_OUTPATIENT_CLINIC_OR_DEPARTMENT_OTHER): Payer: Medicare Other

## 2017-07-14 VITALS — BP 132/79 | HR 87 | Temp 98.6°F | Resp 18

## 2017-07-14 DIAGNOSIS — Z5111 Encounter for antineoplastic chemotherapy: Secondary | ICD-10-CM

## 2017-07-14 DIAGNOSIS — D469 Myelodysplastic syndrome, unspecified: Secondary | ICD-10-CM

## 2017-07-14 DIAGNOSIS — D4620 Refractory anemia with excess of blasts, unspecified: Secondary | ICD-10-CM

## 2017-07-14 MED ORDER — POLYETHYLENE GLYCOL 3350 17 G PO PACK: 17.0000 g | PACK | Freq: Two times a day (BID) | ORAL | 0 refills | Status: AC

## 2017-07-14 MED ORDER — ONDANSETRON HCL 8 MG PO TABS
ORAL_TABLET | ORAL | Status: AC
  Filled 2017-07-14: qty 1

## 2017-07-14 MED ORDER — AZACITIDINE CHEMO SQ INJECTION
50.0000 mg/m2 | Freq: Once | INTRAMUSCULAR | Status: AC
  Administered 2017-07-14: 100 mg via SUBCUTANEOUS
  Filled 2017-07-14: qty 4

## 2017-07-14 MED ORDER — ONDANSETRON HCL 8 MG PO TABS
8.0000 mg | ORAL_TABLET | Freq: Once | ORAL | Status: AC
  Administered 2017-07-14: 8 mg via ORAL

## 2017-07-14 NOTE — Patient Instructions (Signed)
[facility] Discharge Instructions for Patients Receiving Chemotherapy  Today you received the following chemotherapy agents: Vidaza   To help prevent nausea and vomiting after your treatment, we encourage you to take your nausea medication as directed.    If you develop nausea and vomiting that is not controlled by your nausea medication, call the clinic.   BELOW ARE SYMPTOMS THAT SHOULD BE REPORTED IMMEDIATELY:  *FEVER GREATER THAN 100.5 F  *CHILLS WITH OR WITHOUT FEVER  NAUSEA AND VOMITING THAT IS NOT CONTROLLED WITH YOUR NAUSEA MEDICATION  *UNUSUAL SHORTNESS OF BREATH  *UNUSUAL BRUISING OR BLEEDING  TENDERNESS IN MOUTH AND THROAT WITH OR WITHOUT PRESENCE OF ULCERS  *URINARY PROBLEMS  *BOWEL PROBLEMS  UNUSUAL RASH Items with * indicate a potential emergency and should be followed up as soon as possible.  Feel free to call the clinic you have any questions or concerns. The clinic phone number is [phone].  Please show the CHEMO ALERT CARD at check-in to the Emergency Department and triage nurse.   

## 2017-07-15 ENCOUNTER — Telehealth: Payer: Self-pay

## 2017-07-15 ENCOUNTER — Ambulatory Visit: Payer: Medicare Other | Admitting: Nurse Practitioner

## 2017-07-15 ENCOUNTER — Ambulatory Visit (HOSPITAL_BASED_OUTPATIENT_CLINIC_OR_DEPARTMENT_OTHER): Payer: Medicare Other

## 2017-07-15 VITALS — BP 125/73 | HR 91 | Temp 98.3°F | Resp 17

## 2017-07-15 DIAGNOSIS — D469 Myelodysplastic syndrome, unspecified: Secondary | ICD-10-CM

## 2017-07-15 DIAGNOSIS — D4620 Refractory anemia with excess of blasts, unspecified: Secondary | ICD-10-CM | POA: Diagnosis not present

## 2017-07-15 DIAGNOSIS — Z5111 Encounter for antineoplastic chemotherapy: Secondary | ICD-10-CM

## 2017-07-15 MED ORDER — AZACITIDINE CHEMO SQ INJECTION
50.0000 mg/m2 | Freq: Once | INTRAMUSCULAR | Status: AC
  Administered 2017-07-15: 100 mg via SUBCUTANEOUS
  Filled 2017-07-15: qty 4

## 2017-07-15 MED ORDER — ONDANSETRON HCL 8 MG PO TABS
ORAL_TABLET | ORAL | Status: AC
  Filled 2017-07-15: qty 1

## 2017-07-15 MED ORDER — ONDANSETRON HCL 8 MG PO TABS
8.0000 mg | ORAL_TABLET | Freq: Once | ORAL | Status: AC
  Administered 2017-07-15: 8 mg via ORAL

## 2017-07-15 NOTE — Patient Instructions (Signed)
[facility] Discharge Instructions for Patients Receiving Chemotherapy  Today you received the following chemotherapy agents: Vidaza   To help prevent nausea and vomiting after your treatment, we encourage you to take your nausea medication as directed.    If you develop nausea and vomiting that is not controlled by your nausea medication, call the clinic.   BELOW ARE SYMPTOMS THAT SHOULD BE REPORTED IMMEDIATELY:  *FEVER GREATER THAN 100.5 F  *CHILLS WITH OR WITHOUT FEVER  NAUSEA AND VOMITING THAT IS NOT CONTROLLED WITH YOUR NAUSEA MEDICATION  *UNUSUAL SHORTNESS OF BREATH  *UNUSUAL BRUISING OR BLEEDING  TENDERNESS IN MOUTH AND THROAT WITH OR WITHOUT PRESENCE OF ULCERS  *URINARY PROBLEMS  *BOWEL PROBLEMS  UNUSUAL RASH Items with * indicate a potential emergency and should be followed up as soon as possible.  Feel free to call the clinic you have any questions or concerns. The clinic phone number is [phone].  Please show the CHEMO ALERT CARD at check-in to the Emergency Department and triage nurse.   

## 2017-07-15 NOTE — Telephone Encounter (Signed)
Jordan Terry called b/c pt has appt at PCP for labs today. Discussed he had CBC with Diff and retic and CMP on Monday. These do not necessarily need to be repeated. Since it is general assessment they may want to draw other labs. It is OK to draw labs, just hold pressure longer than usual and use coban instead of tape. She appreciated advice.

## 2017-07-15 NOTE — Telephone Encounter (Signed)
Pt c/o constipation, which is a continual concern for the pt. Explained to daughter that pt should begin taking magnesium citrate 1/2 bottle in the morning and evening (maximum 338mL). In addition, start Senna S 2 pills twice a day. Once bowel movement improve, pt should stop both medications and continue taking miralax regularly to improved bowel function. If pt begins having diarrhea, all laxatives should be stopped. Daughter verbalized understanding and intent to discuss with pt and mother this evening.   In the future, pt should be able to continue following these guidelines based on days of constipation to promote appropriate bowel movements.

## 2017-07-16 ENCOUNTER — Ambulatory Visit (HOSPITAL_BASED_OUTPATIENT_CLINIC_OR_DEPARTMENT_OTHER): Payer: Medicare Other

## 2017-07-16 ENCOUNTER — Ambulatory Visit: Payer: Medicare Other | Admitting: Nurse Practitioner

## 2017-07-16 VITALS — BP 142/74 | HR 87 | Temp 98.4°F | Resp 17

## 2017-07-16 DIAGNOSIS — Z5111 Encounter for antineoplastic chemotherapy: Secondary | ICD-10-CM | POA: Diagnosis not present

## 2017-07-16 DIAGNOSIS — D4620 Refractory anemia with excess of blasts, unspecified: Secondary | ICD-10-CM

## 2017-07-16 DIAGNOSIS — D469 Myelodysplastic syndrome, unspecified: Secondary | ICD-10-CM

## 2017-07-16 MED ORDER — AZACITIDINE CHEMO SQ INJECTION
50.0000 mg/m2 | Freq: Once | INTRAMUSCULAR | Status: AC
  Administered 2017-07-16: 100 mg via SUBCUTANEOUS
  Filled 2017-07-16: qty 4

## 2017-07-16 MED ORDER — ONDANSETRON HCL 8 MG PO TABS
8.0000 mg | ORAL_TABLET | Freq: Once | ORAL | Status: AC
  Administered 2017-07-16: 8 mg via ORAL

## 2017-07-16 MED ORDER — ONDANSETRON HCL 8 MG PO TABS
ORAL_TABLET | ORAL | Status: AC
  Filled 2017-07-16: qty 1

## 2017-07-16 NOTE — Patient Instructions (Signed)
[facility] Discharge Instructions for Patients Receiving Chemotherapy  Today you received the following chemotherapy agents:  Vidaza  To help prevent nausea and vomiting after your treatment, we encourage you to take your nausea medication as prescribed.   If you develop nausea and vomiting that is not controlled by your nausea medication, call the clinic.   BELOW ARE SYMPTOMS THAT SHOULD BE REPORTED IMMEDIATELY:  *FEVER GREATER THAN 100.5 F  *CHILLS WITH OR WITHOUT FEVER  NAUSEA AND VOMITING THAT IS NOT CONTROLLED WITH YOUR NAUSEA MEDICATION  *UNUSUAL SHORTNESS OF BREATH  *UNUSUAL BRUISING OR BLEEDING  TENDERNESS IN MOUTH AND THROAT WITH OR WITHOUT PRESENCE OF ULCERS  *URINARY PROBLEMS  *BOWEL PROBLEMS  UNUSUAL RASH Items with * indicate a potential emergency and should be followed up as soon as possible.  Feel free to call the clinic should you have any questions or concerns. The clinic phone number is [phone].  Please show the CHEMO ALERT CARD at check-in to the Emergency Department and triage nurse.   

## 2017-07-19 ENCOUNTER — Other Ambulatory Visit (HOSPITAL_BASED_OUTPATIENT_CLINIC_OR_DEPARTMENT_OTHER): Payer: Medicare Other

## 2017-07-19 DIAGNOSIS — D469 Myelodysplastic syndrome, unspecified: Secondary | ICD-10-CM

## 2017-07-19 DIAGNOSIS — D4620 Refractory anemia with excess of blasts, unspecified: Secondary | ICD-10-CM

## 2017-07-19 DIAGNOSIS — D61818 Other pancytopenia: Secondary | ICD-10-CM | POA: Diagnosis not present

## 2017-07-19 LAB — CBC & DIFF AND RETIC
BASO%: 0.5 % (ref 0.0–2.0)
BASOS ABS: 0 10*3/uL (ref 0.0–0.1)
EOS ABS: 0 10*3/uL (ref 0.0–0.5)
EOS%: 2.2 % (ref 0.0–7.0)
HCT: 24.8 % — ABNORMAL LOW (ref 38.4–49.9)
HGB: 8 g/dL — ABNORMAL LOW (ref 13.0–17.1)
IMMATURE RETIC FRACT: 16 % — AB (ref 3.00–10.60)
LYMPH#: 1.3 10*3/uL (ref 0.9–3.3)
LYMPH%: 68.5 % — AB (ref 14.0–49.0)
MCH: 29 pg (ref 27.2–33.4)
MCHC: 32.3 g/dL (ref 32.0–36.0)
MCV: 89.9 fL (ref 79.3–98.0)
MONO#: 0 10*3/uL — AB (ref 0.1–0.9)
MONO%: 1.6 % (ref 0.0–14.0)
NEUT#: 0.5 10*3/uL — CL (ref 1.5–6.5)
NEUT%: 27.2 % — AB (ref 39.0–75.0)
Platelets: 31 10*3/uL — ABNORMAL LOW (ref 140–400)
RBC: 2.76 10*6/uL — AB (ref 4.20–5.82)
RDW: 18.6 % — ABNORMAL HIGH (ref 11.0–14.6)
RETIC %: 0.87 % (ref 0.80–1.80)
Retic Ct Abs: 24.01 10*3/uL — ABNORMAL LOW (ref 34.80–93.90)
WBC: 1.8 10*3/uL — ABNORMAL LOW (ref 4.0–10.3)

## 2017-07-19 LAB — TECHNOLOGIST REVIEW

## 2017-07-22 ENCOUNTER — Ambulatory Visit: Payer: Medicare Other | Admitting: Nurse Practitioner

## 2017-07-26 ENCOUNTER — Other Ambulatory Visit (HOSPITAL_BASED_OUTPATIENT_CLINIC_OR_DEPARTMENT_OTHER): Payer: Medicare Other

## 2017-07-26 DIAGNOSIS — D4620 Refractory anemia with excess of blasts, unspecified: Secondary | ICD-10-CM | POA: Diagnosis not present

## 2017-07-26 DIAGNOSIS — D469 Myelodysplastic syndrome, unspecified: Secondary | ICD-10-CM

## 2017-07-26 DIAGNOSIS — D61818 Other pancytopenia: Secondary | ICD-10-CM | POA: Diagnosis not present

## 2017-07-26 LAB — CBC & DIFF AND RETIC
BASO%: 0 % (ref 0.0–2.0)
BASOS ABS: 0 10*3/uL (ref 0.0–0.1)
EOS%: 1 % (ref 0.0–7.0)
Eosinophils Absolute: 0 10*3/uL (ref 0.0–0.5)
HEMATOCRIT: 24.6 % — AB (ref 38.4–49.9)
HEMOGLOBIN: 8.1 g/dL — AB (ref 13.0–17.1)
Immature Retic Fract: 16.7 % — ABNORMAL HIGH (ref 3.00–10.60)
LYMPH%: 62.9 % — ABNORMAL HIGH (ref 14.0–49.0)
MCH: 29 pg (ref 27.2–33.4)
MCHC: 32.9 g/dL (ref 32.0–36.0)
MCV: 88.2 fL (ref 79.3–98.0)
MONO#: 0.1 10*3/uL (ref 0.1–0.9)
MONO%: 6.3 % (ref 0.0–14.0)
NEUT#: 0.6 10*3/uL — ABNORMAL LOW (ref 1.5–6.5)
NEUT%: 29.8 % — ABNORMAL LOW (ref 39.0–75.0)
NRBC: 0 % (ref 0–0)
Platelets: 26 10*3/uL — ABNORMAL LOW (ref 140–400)
RBC: 2.79 10*6/uL — ABNORMAL LOW (ref 4.20–5.82)
RDW: 19.5 % — AB (ref 11.0–14.6)
Retic %: 0.94 % (ref 0.80–1.80)
Retic Ct Abs: 26.23 10*3/uL — ABNORMAL LOW (ref 34.80–93.90)
WBC: 2.1 10*3/uL — ABNORMAL LOW (ref 4.0–10.3)
lymph#: 1.3 10*3/uL (ref 0.9–3.3)

## 2017-07-26 LAB — TECHNOLOGIST REVIEW

## 2017-07-30 ENCOUNTER — Other Ambulatory Visit: Payer: Self-pay | Admitting: *Deleted

## 2017-07-30 DIAGNOSIS — D469 Myelodysplastic syndrome, unspecified: Secondary | ICD-10-CM

## 2017-08-02 ENCOUNTER — Other Ambulatory Visit: Payer: Self-pay

## 2017-08-02 ENCOUNTER — Telehealth: Payer: Self-pay

## 2017-08-02 ENCOUNTER — Other Ambulatory Visit (HOSPITAL_BASED_OUTPATIENT_CLINIC_OR_DEPARTMENT_OTHER): Payer: Medicare Other

## 2017-08-02 ENCOUNTER — Ambulatory Visit (HOSPITAL_COMMUNITY)
Admission: RE | Admit: 2017-08-02 | Discharge: 2017-08-02 | Disposition: A | Discharge status: Home / Self Care | Payer: Medicare Other | Source: Ambulatory Visit | Attending: Hematology | Admitting: Hematology

## 2017-08-02 DIAGNOSIS — R319 Hematuria, unspecified: Secondary | ICD-10-CM | POA: Diagnosis not present

## 2017-08-02 DIAGNOSIS — D649 Anemia, unspecified: Secondary | ICD-10-CM | POA: Insufficient documentation

## 2017-08-02 DIAGNOSIS — D61818 Other pancytopenia: Secondary | ICD-10-CM | POA: Diagnosis not present

## 2017-08-02 DIAGNOSIS — D4620 Refractory anemia with excess of blasts, unspecified: Secondary | ICD-10-CM | POA: Diagnosis not present

## 2017-08-02 DIAGNOSIS — R58 Hemorrhage, not elsewhere classified: Secondary | ICD-10-CM

## 2017-08-02 DIAGNOSIS — D696 Thrombocytopenia, unspecified: Secondary | ICD-10-CM

## 2017-08-02 DIAGNOSIS — D469 Myelodysplastic syndrome, unspecified: Secondary | ICD-10-CM

## 2017-08-02 LAB — COMPREHENSIVE METABOLIC PANEL
ALBUMIN: 4 g/dL (ref 3.5–5.0)
ALT: 14 U/L (ref 0–55)
ANION GAP: 6 meq/L (ref 3–11)
AST: 15 U/L (ref 5–34)
Alkaline Phosphatase: 50 U/L (ref 40–150)
BILIRUBIN TOTAL: 0.43 mg/dL (ref 0.20–1.20)
BUN: 19.4 mg/dL (ref 7.0–26.0)
CALCIUM: 9.1 mg/dL (ref 8.4–10.4)
CO2: 28 meq/L (ref 22–29)
CREATININE: 1.1 mg/dL (ref 0.7–1.3)
Chloride: 106 mEq/L (ref 98–109)
EGFR: 65 mL/min/{1.73_m2} — ABNORMAL LOW (ref >90)
GLUCOSE: 82 mg/dL (ref 70–140)
Potassium: 4.6 mEq/L (ref 3.5–5.1)
Sodium: 140 mEq/L (ref 136–145)
Total Protein: 7.3 g/dL (ref 6.4–8.3)

## 2017-08-02 LAB — CBC WITH DIFFERENTIAL/PLATELET
BASO%: 0.5 % (ref 0.0–2.0)
BASOS ABS: 0 10*3/uL (ref 0.0–0.1)
EOS ABS: 0 10*3/uL (ref 0.0–0.5)
EOS%: 0.5 % (ref 0.0–7.0)
HEMATOCRIT: 23.9 % — AB (ref 38.4–49.9)
HGB: 7.8 g/dL — ABNORMAL LOW (ref 13.0–17.1)
LYMPH%: 63.2 % — AB (ref 14.0–49.0)
MCH: 29 pg (ref 27.2–33.4)
MCHC: 32.6 g/dL (ref 32.0–36.0)
MCV: 88.8 fL (ref 79.3–98.0)
MONO#: 0.1 10*3/uL (ref 0.1–0.9)
MONO%: 5.5 % (ref 0.0–14.0)
NEUT#: 0.6 10*3/uL — ABNORMAL LOW (ref 1.5–6.5)
NEUT%: 30.3 % — AB (ref 39.0–75.0)
PLATELETS: 20 10*3/uL — AB (ref 140–400)
RBC: 2.69 10*6/uL — ABNORMAL LOW (ref 4.20–5.82)
RDW: 20.6 % — ABNORMAL HIGH (ref 11.0–14.6)
WBC: 1.8 10*3/uL — ABNORMAL LOW (ref 4.0–10.3)
lymph#: 1.2 10*3/uL (ref 0.9–3.3)
nRBC: 0 % (ref 0–0)

## 2017-08-02 LAB — TECHNOLOGIST REVIEW

## 2017-08-02 NOTE — Telephone Encounter (Signed)
Two calls to pt home. Pt is feeling increasingly fatigued this weekend. Hgb 7.8 and plt 20. Lab called to send Hold Tube to Blood Bank. [facility] called. Blood Bank aware that 1U (CMV negative and irradiated) to be transfused Wednesday (10/10) and order placed for type and screen so that they can use the Hold Tube. Pt aware of plan to have 1U at [facility] on Wednesday at 1100.

## 2017-08-04 ENCOUNTER — Ambulatory Visit (HOSPITAL_COMMUNITY)
Admission: RE | Admit: 2017-08-04 | Discharge: 2017-08-04 | Disposition: A | Discharge status: Home / Self Care | Payer: Medicare Other | Source: Ambulatory Visit | Attending: Hematology | Admitting: Hematology

## 2017-08-04 DIAGNOSIS — D649 Anemia, unspecified: Secondary | ICD-10-CM

## 2017-08-04 DIAGNOSIS — R319 Hematuria, unspecified: Secondary | ICD-10-CM | POA: Diagnosis not present

## 2017-08-04 LAB — PREPARE RBC (CROSSMATCH)

## 2017-08-04 MED ORDER — SODIUM CHLORIDE 0.9 % IV SOLN
250.0000 mL | Freq: Once | INTRAVENOUS | Status: AC
Start: 2017-08-04 — End: 2017-08-04
  Administered 2017-08-04: 250 mL via INTRAVENOUS

## 2017-08-04 MED ORDER — ACETAMINOPHEN 325 MG PO TABS
650.0000 mg | ORAL_TABLET | Freq: Once | ORAL | Status: AC
  Administered 2017-08-04: 650 mg via ORAL
  Filled 2017-08-04: qty 2

## 2017-08-04 NOTE — Progress Notes (Signed)
Diagnosis: Anemia, unspecified type (D64.9)  Provider: Carolyne Fiscal MD   Procedure: Patient received 1 unit of PRBCs via piv.  Patienttolerated well.  Post procedure: Patient alert, oriented and ambulatory at discharge.

## 2017-08-04 NOTE — Discharge Instructions (Signed)

## 2017-08-05 ENCOUNTER — Encounter: Payer: Self-pay | Admitting: Hematology

## 2017-08-05 ENCOUNTER — Encounter: Payer: Self-pay | Admitting: *Deleted

## 2017-08-05 DIAGNOSIS — Z7189 Other specified counseling: Secondary | ICD-10-CM | POA: Insufficient documentation

## 2017-08-05 LAB — TYPE AND SCREEN
ABO/RH(D): A POS
Antibody Screen: NEGATIVE
Unit division: 0

## 2017-08-05 LAB — BPAM RBC
Blood Product Expiration Date: 201810182359
ISSUE DATE / TIME: 201810101040
Unit Type and Rh: 6200

## 2017-08-05 NOTE — Progress Notes (Signed)
. .    HEMATOLOGY/ONCOLOGY CLINIC NOTE  Date of Service: 08/09/2017   Patient Care Team: [name], MD as PCP - General (Internal Medicine) Norma Fredrickson, MD as Consulting Physician (Psychiatry)  CHIEF COMPLAINTS/PURPOSE OF CONSULTATION:  F/u for MDS   HISTORY OF PRESENTING ILLNESS:   Jordan Terry is a wonderful 83 y.o. male who has been referred to [place] by Dr .[name], MD/ Dr Harriett Sine MD for evaluation and management of pancytopenia.  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  He recently transferred his dermatology care to Dr. Elvera Lennox in [place] and had recent labs on 03/29/2017 which showed pancytopenia with a hemoglobin of 10.7 with an MCV of 82.5, platelet count of 45k , WBC count of 2.1k with an [place] of 924.  He reports that he has been off methotrexate for 2 months. Notes no other new medications. No recent symptoms suggestive of a viral infection. No overt fevers chills or night sweats. Does note some fatigue. Denies any previous history of liver disease. No recent infections requiring antibiotics. No new focal bone pain.   INTERVAL HISTORY   Jordan Terry is here for his scheduled f/u prior to 4th cycle of Vidaza. He presented to the clinic today accompanied by family. He did require a blood transfusion on 08/04/17 due to Hgb being 7.8 on 08/02/17. He felt week before the blood transfusion and only feels some improvement since.  His current blood counts are holding overall steady but no true improving trend. If his counts continue to be low on treatment around cycle 6, a bone marrow biopsy will be done to see if other treatment decitabine to help better control his disease. He has not had another episode of significant bleedng since last  visit.  His wife notes him being significantly fatigued. At night when he lays down he experiences intermittent shaking of his head until he falls asleep. If on and off of ativan this can be a withdrawal symptom. This can also be from anxiety. His family reports he has been able to drive himself since last visit. His fatigue is related to his anemia. Being active will help with this. Patient feels he is not ready to do rehabilitation to help his muscle mass. He has no recent bleeding from catheterization. 2 weeks ago he reports to being sick in a tired way, afebrile, and the next day he was fine. He has not had the flu vaccination in decades. He opted for the vaccination today.  He notes a sore on his upper right shoulder that has been changing lately. He has had it for 6 years. It is possibly a cyst, not infected at this point. He will continue to monitor.  Does have some rash on chest from his Grover's disease (was previously on metformin for this).   MEDICAL HISTORY:  Past Medical History:  Diagnosis Date  . Anginal pain ([place]) 20 years ago  . Anxiety   . Arthritis   . Bilateral tinnitus    wears hearing aides  . Cancer [facility])    prostate  . Grover's disease   . Headache(784.0)    not since retired  . Hematuria 04/14/2017  . Pancytopenia ([place]) 04/19/2017  . Self-catheterizes urinary bladder 04/19/2017  . Vertigo    SURGICAL HISTORY: Past Surgical History:  Procedure  Laterality Date  . CARDIAC CATHETERIZATION  "20 years ago"  . CARPAL TUNNEL RELEASE Left 2008  . CATARACT EXTRACTION Bilateral 2014  . CYSTOSCOPY WITH URETHRAL DILATATION N/A 08/14/2013   Procedure: CYSTOSCOPY WITH URETHRAL DILATATION BALLOON DILATION OF URETHRAL STRICTURE;  Surgeon: Dutch Gray, MD;  Location: [facility];  Service: Urology;  Laterality: N/A;  BALLOON DILATION   . CYSTOSCOPY WITH URETHRAL DILATATION N/A 05/07/2014   Procedure: CYSTOSCOPY WITH BALLOON DILATION OF URETHRAL STRICTURE;  Surgeon: Raynelle Bring, MD;   Location: [facility];  Service: Urology;  Laterality: N/A;  [place]   "scope"  . NOSE SURGERY  1991, 1998  . RADIOACTIVE SEED IMPLANT  2002   "for prostate cancer"  . SHOULDER SURGERY  2012   right  . THYROIDECTOMY, PARTIAL  1974  . VASECTOMY  1970    SOCIAL HISTORY: Social History   Social History  . Marital status: Married    Spouse name: [name]  . Number of children: 3  . Years of education: college   Occupational History  . Not on file.   Social History Main Topics  . Smoking status: Former Smoker    Years: 10.00    Types: Cigars    Quit date: 1979  . Smokeless tobacco: Never Used  . Alcohol use No  . Drug use: No  . Sexual activity: Not on file   Other Topics Concern  . Not on file   Social History Narrative   Patient is married [name]) and lives at home with his wife.   Patient has three children.   Patient is retired.   Patient has a college education in [place].   Patient is right-handed.   Patient drinks one cup of coffee daily and one 8 oz of tea daily.    FAMILY HISTORY: Family History  Problem Relation Age of Onset  . Anxiety disorder Mother   . Cancer Father        colon, lung  . Diabetes Brother   . Cancer Brother   . Diabetes Sister   . Leukemia Sister   . Cancer Sister        breast  . Cancer Sister        breast  . Cancer Sister        thyroid  . Diabetes Sister     ALLERGIES:  is allergic to benadryl [diphenhydramine hcl] and hydrocodone.  MEDICATIONS:  Current Outpatient Prescriptions  Medication Sig Dispense Refill  . acyclovir (ZOVIRAX) 400 MG tablet Take 1 tablet (400 mg total) by mouth daily. 30 tablet 2  . fluconazole (DIFLUCAN) 200 MG tablet Take 1 tablet (200 mg total) by mouth daily. 30 tablet 0  . gabapentin (NEURONTIN) 100 MG capsule Take 200 mg by mouth 3 (three) times daily.     . hydrocortisone (ANUSOL-HC) 2.5 % rectal cream Place rectally 4 (four) times  daily. 30 g 0  . LORazepam (ATIVAN) 0.5 MG tablet Take 1 mg by mouth at bedtime.    . nortriptyline (PAMELOR) 25 MG capsule Take 1 capsule (25 mg total) by mouth at bedtime. 90 capsule 1  . ondansetron (ZOFRAN) 8 MG tablet Take 1 tablet (8 mg total) by mouth 2 (two) times daily as needed (Nausea or vomiting). 30 tablet 1  . oxyCODONE-acetaminophen (PERCOCET) 7.5-325 MG per tablet Take 0.5-1 tablets by mouth every 8 (eight) hours as needed (pain.).     Marland Kitchen polyethylene glycol (MIRALAX /  GLYCOLAX) packet Take 17 g by mouth 2 (two) times daily. 14 each 0  . prochlorperazine (COMPAZINE) 10 MG tablet Take 1 tablet (10 mg total) by mouth every 6 (six) hours as needed (Nausea or vomiting). 30 tablet 1  . senna-docusate (SENOKOT-S) 8.6-50 MG tablet Take 2 tablets by mouth 2 (two) times daily. 30 tablet 0  . temazepam (RESTORIL) 15 MG capsule Take 15 mg by mouth at bedtime.    Marland Kitchen tetrahydrozoline-zinc (VISINE-AC) 0.05-0.25 % ophthalmic solution Place 1 drop into both eyes daily as needed (allergies).    . triamcinolone cream (KENALOG) 0.1 % Apply 1 application topically every morning. APPLY TO CHEST AND BACK EVERY MORNING FOR GROVER DISEASE     No current facility-administered medications for this visit.     REVIEW OF SYSTEMS:    10 Point review of Systems was done is negative except as noted above.  PHYSICAL EXAMINATION: ECOG PERFORMANCE STATUS: 2 - Symptomatic, <50% confined to bed  . Vitals:   08/09/17 1216  BP: 120/69  Pulse: (!) 111  Resp: 17  Temp: 98.5 F (36.9 C)  SpO2: (!) 89%   Filed Weights   08/09/17 1216  Weight: 161 lb (73 kg)   .Body mass index is 20.67 kg/m.  GENERAL:alert, is no acute distress and comfortable SKIN: no acute rashes, Noted to have petechiae on his lower extremities. Has cyst on upper right shoulder EYES: conjunctiva are pink and non-injected, sclera anicteric OROPHARYNX: MMM, no exudates, no oropharyngeal erythema or ulceration NECK: supple, no  JVD LYMPH:  no palpable lymphadenopathy in the cervical, axillary or inguinal regions LUNGS: clear to auscultation b/l with normal respiratory effort HEART: regular rate & rhythm  ABDOMEN:  normoactive bowel sounds , non tender, not distended. Borderline palpable spleen no overt palpable hepatomegaly. Extremity: no pedal edema PSYCH: alert & oriented x 3 with fluent speech NEURO: no focal motor/sensory deficits  LABORATORY DATA:  I have reviewed the data as listed  .Marland Kitchen CBC Latest Ref Rng & Units 08/09/2017 08/02/2017 07/26/2017  WBC 4.0 - 10.3 10e3/uL 2.1(L) 1.8(L) 2.1(L)  Hemoglobin 13.0 - 17.1 g/dL 8.4(L) 7.8(L) 8.1(L)  Hematocrit 38.4 - 49.9 % 25.6(L) 23.9(L) 24.6(L)  Platelets 140 - 400 10e3/uL 19(L) 20(L) 26(L)   ANC 400 . CBC    Component Value Date/Time   WBC 2.1 (L) 08/09/2017 1149   WBC 1.4 (LL) 06/23/2017 0323   RBC 2.87 (L) 08/09/2017 1149   RBC 3.21 (L) 06/23/2017 0323   HGB 8.4 (L) 08/09/2017 1149   HCT 25.6 (L) 08/09/2017 1149   PLT 19 (L) 08/09/2017 1149   MCV 89.2 08/09/2017 1149   MCH 29.3 08/09/2017 1149   MCH 29.9 06/23/2017 0323   MCHC 32.8 08/09/2017 1149   MCHC 34.5 06/23/2017 0323   RDW 20.5 (H) 08/09/2017 1149   LYMPHSABS 1.3 08/09/2017 1149   MONOABS 0.0 (L) 08/09/2017 1149   EOSABS 0.0 08/09/2017 1149   BASOSABS 0.0 08/09/2017 1149    . CMP Latest Ref Rng & Units 08/09/2017 08/02/2017 07/12/2017  Glucose 70 - 140 mg/dl 69(L) 82 96  BUN 7.0 - 26.0 mg/dL 19.9 19.4 18.1  Creatinine 0.7 - 1.3 mg/dL 1.1 1.1 1.0  Sodium 136 - 145 mEq/L 140 140 140  Potassium 3.5 - 5.1 mEq/L 3.8 4.6 4.1  Chloride 101 - 111 mmol/L - - -  CO2 22 - 29 mEq/L _0 Calcium 8.4 - 10.4 mg/dL 9.2 9.1 9.4  Total Protein 6.4 - 8.3 g/dL 7.3 7.3  7.3  Total Bilirubin 0.20 - 1.20 mg/dL 0.62 0.43 0.43  Alkaline Phos 40 - 150 U/L 48 50 55  AST 5 - 34 U/L _0 ALT 0 - 55 U/L _1 RADIOGRAPHIC STUDIES: I have personally reviewed the radiological  images as listed and agreed with the findings in the report. No results found.   CT chest/abd/pelvis 04/20/2017 IMPRESSION: Diffuse cystitis involving the urinary bladder. Foley catheter in place. No evidence of lymphadenopathy or metastatic disease within the chest, abdomen, or pelvis. 2.6 cm left thyroid lobe nodule. Consider thyroid ultrasound for further evaluation. This recommendation follows ACR consensus guidelines: Managing Incidental Thyroid Nodules Detected on Imaging: White Paper of the ACR Incidental Thyroid Findings Committee. J Am Coll Radiol 2015;12(2):143-150. Colonic diverticulosis. No radiographic evidence of diverticulitis. Aortic atherosclerosis. Electronically Signed   By: Earle Gell M.D.   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 HIV nonreactive Hepatitis C negative LDH within normal limits at 191 S/p Vidaza - dose reduced x 3 cycles  #2 thrombocytopenia platelet count down from 30K to 19K. Required one apheresis unit of transfusion for hematuria and platelets of 17k previously. NO evidence of bleeding at this time.  #3 Neutropenia, WBC improved to 2.1 with an ANC of 0.7k -continue Acyclovir and fluconazole for prophylaxis.  #4 normocytic anemia hemoglobin of 8.4.  PLAN -given current blood counts we will continue cycle 4 of Vidaza at 55m/m2 for 5 days -I discussed his blood counts are not responding robustly so far to current treatment. If not significantly improving after cycle 6, I suggest a bone marrow biopsy to include other options of treatment to better control his disease, like possible decitabine.  -Patient declined the option of a second opinion at this time.   -Patient will proceed with his current cycle 4 of Vidaza today. -if counts improve might dose  escalate his VLacie Scotts -will schedule cycle 5 and 6 of Vidaza -transfuse plt prn for PLT<10k or if bleeding -transfuse prbc prn for hgb <8 or if symptomatic (CMV neg irradiated blood products) -continue fluconazole and acyclovir for prophylaxis. -I offered the flu vaccination today especially given his low blood counts, He opted in for the vaccination today. Due to his low platelets Intra-muscular could cause a hematoma, I suggest getting flu shot done subcutaneously.   #5 atypical lymphocytes noted on peripheral blood smear - monoclonal B lymphocytosis  CT c/a/p with no evidence of lymphadenopathy/hepatosplenomegaly. -no intervention at this time. Monitor  #6 Possible withdrawal reaction to Ativan. Experiences intermittent shaking of his head when going to bed until he falls asleep. He will follow up with his Psychiatrist.   #7 Financial Support -Send referral to financial advisor   send referral to financial advisor  Schedule next 2 cycles of Vidaza Refill Diflucan and acyclovir today  Flu vaccination today, subcutaneously  Labs in 2 weeks  RTC with Dr KIrene Limboin 4 weeks with Vidaza C5 D1   All of the patients questions were answered with apparent satisfaction. The patient knows to call the clinic with any problems, questions or concerns.  I spent 20 minutes counseling the patient face to face. The total time spent in the appointment was 25 minutes and more than 50% was on counseling and direct patient cares.    Sherrika Weakland  Irene Limbo MD [place] AAHIVMS [facility] Hematology/Oncology Physician [place]  (Office):       [phone] (Work cell):  [phone] (Fax):           [phone]  This document serves as a record of services personally performed by Sullivan Lone, MD. It was created on her behalf by Joslyn Devon, a trained medical scribe. The creation of this record is based on the scribe's personal observations and the provider's statements to them. This document has been  checked and approved by the attending provider.

## 2017-08-09 ENCOUNTER — Ambulatory Visit (HOSPITAL_BASED_OUTPATIENT_CLINIC_OR_DEPARTMENT_OTHER): Payer: Medicare Other

## 2017-08-09 ENCOUNTER — Other Ambulatory Visit (HOSPITAL_BASED_OUTPATIENT_CLINIC_OR_DEPARTMENT_OTHER): Payer: Medicare Other

## 2017-08-09 ENCOUNTER — Ambulatory Visit: Payer: Medicare Other | Admitting: Hematology

## 2017-08-09 ENCOUNTER — Other Ambulatory Visit: Payer: Self-pay | Admitting: *Deleted

## 2017-08-09 ENCOUNTER — Telehealth: Payer: Self-pay | Admitting: Hematology

## 2017-08-09 ENCOUNTER — Other Ambulatory Visit: Payer: Medicare Other

## 2017-08-09 ENCOUNTER — Ambulatory Visit (HOSPITAL_BASED_OUTPATIENT_CLINIC_OR_DEPARTMENT_OTHER): Payer: Medicare Other | Admitting: Hematology

## 2017-08-09 ENCOUNTER — Ambulatory Visit: Payer: Medicare Other

## 2017-08-09 VITALS — BP 120/69 | HR 111 | Temp 98.5°F | Resp 17 | Ht 74.0 in | Wt 161.0 lb

## 2017-08-09 VITALS — BP 128/76 | HR 73

## 2017-08-09 DIAGNOSIS — D469 Myelodysplastic syndrome, unspecified: Secondary | ICD-10-CM

## 2017-08-09 DIAGNOSIS — Z5111 Encounter for antineoplastic chemotherapy: Secondary | ICD-10-CM

## 2017-08-09 DIAGNOSIS — Z7189 Other specified counseling: Secondary | ICD-10-CM

## 2017-08-09 DIAGNOSIS — Z23 Encounter for immunization: Secondary | ICD-10-CM | POA: Diagnosis not present

## 2017-08-09 DIAGNOSIS — D4620 Refractory anemia with excess of blasts, unspecified: Secondary | ICD-10-CM | POA: Diagnosis not present

## 2017-08-09 DIAGNOSIS — D61818 Other pancytopenia: Secondary | ICD-10-CM

## 2017-08-09 LAB — TECHNOLOGIST REVIEW

## 2017-08-09 LAB — CBC WITH DIFFERENTIAL/PLATELET
BASO%: 1 % (ref 0.0–2.0)
Basophils Absolute: 0 10*3/uL (ref 0.0–0.1)
EOS%: 1.4 % (ref 0.0–7.0)
Eosinophils Absolute: 0 10*3/uL (ref 0.0–0.5)
HCT: 25.6 % — ABNORMAL LOW (ref 38.4–49.9)
HGB: 8.4 g/dL — ABNORMAL LOW (ref 13.0–17.1)
LYMPH%: 63.3 % — ABNORMAL HIGH (ref 14.0–49.0)
MCH: 29.3 pg (ref 27.2–33.4)
MCHC: 32.8 g/dL (ref 32.0–36.0)
MCV: 89.2 fL (ref 79.3–98.0)
MONO#: 0 10*3/uL — ABNORMAL LOW (ref 0.1–0.9)
MONO%: 0.5 % (ref 0.0–14.0)
NEUT%: 33.8 % — ABNORMAL LOW (ref 39.0–75.0)
NEUTROS ABS: 0.7 10*3/uL — AB (ref 1.5–6.5)
NRBC: 0 % (ref 0–0)
Platelets: 19 10*3/uL — ABNORMAL LOW (ref 140–400)
RBC: 2.87 10*6/uL — AB (ref 4.20–5.82)
RDW: 20.5 % — AB (ref 11.0–14.6)
WBC: 2.1 10*3/uL — AB (ref 4.0–10.3)
lymph#: 1.3 10*3/uL (ref 0.9–3.3)

## 2017-08-09 LAB — COMPREHENSIVE METABOLIC PANEL
ALT: 17 U/L (ref 0–55)
AST: 17 U/L (ref 5–34)
Albumin: 4.1 g/dL (ref 3.5–5.0)
Alkaline Phosphatase: 48 U/L (ref 40–150)
Anion Gap: 9 mEq/L (ref 3–11)
BILIRUBIN TOTAL: 0.62 mg/dL (ref 0.20–1.20)
BUN: 19.9 mg/dL (ref 7.0–26.0)
CHLORIDE: 104 meq/L (ref 98–109)
CO2: 27 meq/L (ref 22–29)
Calcium: 9.2 mg/dL (ref 8.4–10.4)
Creatinine: 1.1 mg/dL (ref 0.7–1.3)
GLUCOSE: 69 mg/dL — AB (ref 70–140)
Potassium: 3.8 mEq/L (ref 3.5–5.1)
SODIUM: 140 meq/L (ref 136–145)
TOTAL PROTEIN: 7.3 g/dL (ref 6.4–8.3)

## 2017-08-09 MED ORDER — INFLUENZA VAC SPLIT QUAD 0.5 ML IM SUSY
0.5000 mL | PREFILLED_SYRINGE | Freq: Once | INTRAMUSCULAR | Status: AC
  Administered 2017-08-09: 0.5 mL via INTRAMUSCULAR
  Filled 2017-08-09: qty 0.5

## 2017-08-09 MED ORDER — ONDANSETRON HCL 8 MG PO TABS
8.0000 mg | ORAL_TABLET | Freq: Once | ORAL | Status: AC
  Administered 2017-08-09: 8 mg via ORAL

## 2017-08-09 MED ORDER — AZACITIDINE CHEMO SQ INJECTION
50.0000 mg/m2 | Freq: Once | INTRAMUSCULAR | Status: AC
  Administered 2017-08-09: 100 mg via SUBCUTANEOUS
  Filled 2017-08-09: qty 4

## 2017-08-09 MED ORDER — ONDANSETRON HCL 8 MG PO TABS
ORAL_TABLET | ORAL | Status: AC
  Filled 2017-08-09: qty 1

## 2017-08-09 NOTE — Telephone Encounter (Signed)
Scheduled appt per 10/15 los - Gave patient AVS and calender per los.  

## 2017-08-09 NOTE — Patient Instructions (Signed)
[facility]cer Center Discharge Instructions for Patients Receiving Chemotherapy  Today you received the following chemotherapy agents:  Vidaza  To help prevent nausea and vomiting after your treatment, we encourage you to take your nausea medication as prescribed.   If you develop nausea and vomiting that is not controlled by your nausea medication, call the clinic.   BELOW ARE SYMPTOMS THAT SHOULD BE REPORTED IMMEDIATELY:  *FEVER GREATER THAN 100.5 F  *CHILLS WITH OR WITHOUT FEVER  NAUSEA AND VOMITING THAT IS NOT CONTROLLED WITH YOUR NAUSEA MEDICATION  *UNUSUAL SHORTNESS OF BREATH  *UNUSUAL BRUISING OR BLEEDING  TENDERNESS IN MOUTH AND THROAT WITH OR WITHOUT PRESENCE OF ULCERS  *URINARY PROBLEMS  *BOWEL PROBLEMS  UNUSUAL RASH Items with * indicate a potential emergency and should be followed up as soon as possible.  Feel free to call the clinic should you have any questions or concerns. The clinic phone number is [phone].  Please show the CHEMO ALERT CARD at check-in to the Emergency Department and triage nurse.   

## 2017-08-10 ENCOUNTER — Telehealth: Payer: Self-pay | Admitting: Hematology

## 2017-08-10 ENCOUNTER — Ambulatory Visit (HOSPITAL_BASED_OUTPATIENT_CLINIC_OR_DEPARTMENT_OTHER): Payer: Medicare Other

## 2017-08-10 ENCOUNTER — Other Ambulatory Visit: Payer: Self-pay | Admitting: *Deleted

## 2017-08-10 VITALS — BP 126/63 | HR 95 | Temp 98.3°F | Resp 18

## 2017-08-10 DIAGNOSIS — Z7189 Other specified counseling: Secondary | ICD-10-CM

## 2017-08-10 DIAGNOSIS — D4620 Refractory anemia with excess of blasts, unspecified: Secondary | ICD-10-CM

## 2017-08-10 DIAGNOSIS — Z5111 Encounter for antineoplastic chemotherapy: Secondary | ICD-10-CM | POA: Diagnosis not present

## 2017-08-10 DIAGNOSIS — D469 Myelodysplastic syndrome, unspecified: Secondary | ICD-10-CM

## 2017-08-10 MED ORDER — ONDANSETRON HCL 8 MG PO TABS
8.0000 mg | ORAL_TABLET | Freq: Once | ORAL | Status: AC
  Administered 2017-08-10: 8 mg via ORAL

## 2017-08-10 MED ORDER — FLUCONAZOLE 200 MG PO TABS: 200.0000 mg | ORAL_TABLET | Freq: Every day | ORAL | 0 refills | Status: AC

## 2017-08-10 MED ORDER — AZACITIDINE CHEMO SQ INJECTION
50.0000 mg/m2 | Freq: Once | INTRAMUSCULAR | Status: AC
  Administered 2017-08-10: 100 mg via SUBCUTANEOUS
  Filled 2017-08-10: qty 4

## 2017-08-10 MED ORDER — ACYCLOVIR 400 MG PO TABS: 400.0000 mg | ORAL_TABLET | Freq: Every day | ORAL | 2 refills | Status: AC

## 2017-08-10 MED ORDER — ONDANSETRON HCL 8 MG PO TABS
ORAL_TABLET | ORAL | Status: AC
  Filled 2017-08-10: qty 1

## 2017-08-10 NOTE — Telephone Encounter (Signed)
08/09/17 prescription refill scanned in media °

## 2017-08-10 NOTE — Progress Notes (Signed)
Per Dr. Irene Limbo okay to treat with 08/09/17 labs. (wbc 2.1, ANC 0.7, platelets 19).

## 2017-08-10 NOTE — Patient Instructions (Signed)
[facility] Discharge Instructions for Patients Receiving Chemotherapy  Today you received the following chemotherapy agents Vidaza  To help prevent nausea and vomiting after your treatment, we encourage you to take your nausea medication as directed  If you develop nausea and vomiting that is not controlled by your nausea medication, call the clinic.   BELOW ARE SYMPTOMS THAT SHOULD BE REPORTED IMMEDIATELY:  *FEVER GREATER THAN 100.5 F  *CHILLS WITH OR WITHOUT FEVER  NAUSEA AND VOMITING THAT IS NOT CONTROLLED WITH YOUR NAUSEA MEDICATION  *UNUSUAL SHORTNESS OF BREATH  *UNUSUAL BRUISING OR BLEEDING  TENDERNESS IN MOUTH AND THROAT WITH OR WITHOUT PRESENCE OF ULCERS  *URINARY PROBLEMS  *BOWEL PROBLEMS  UNUSUAL RASH Items with * indicate a potential emergency and should be followed up as soon as possible.  Feel free to call the clinic should you have any questions or concerns. The clinic phone number is [phone].  Please show the CHEMO ALERT CARD at check-in to the Emergency Department and triage nurse.   

## 2017-08-11 ENCOUNTER — Ambulatory Visit (HOSPITAL_BASED_OUTPATIENT_CLINIC_OR_DEPARTMENT_OTHER): Payer: Medicare Other

## 2017-08-11 VITALS — BP 130/57 | HR 83 | Temp 98.0°F | Resp 17

## 2017-08-11 DIAGNOSIS — D4620 Refractory anemia with excess of blasts, unspecified: Secondary | ICD-10-CM

## 2017-08-11 DIAGNOSIS — D469 Myelodysplastic syndrome, unspecified: Secondary | ICD-10-CM

## 2017-08-11 DIAGNOSIS — Z5111 Encounter for antineoplastic chemotherapy: Secondary | ICD-10-CM

## 2017-08-11 DIAGNOSIS — Z7189 Other specified counseling: Secondary | ICD-10-CM

## 2017-08-11 MED ORDER — ONDANSETRON HCL 8 MG PO TABS
8.0000 mg | ORAL_TABLET | Freq: Once | ORAL | Status: AC
  Administered 2017-08-11: 8 mg via ORAL

## 2017-08-11 MED ORDER — ONDANSETRON HCL 8 MG PO TABS
ORAL_TABLET | ORAL | Status: AC
  Filled 2017-08-11: qty 1

## 2017-08-11 MED ORDER — AZACITIDINE CHEMO SQ INJECTION
50.0000 mg/m2 | Freq: Once | INTRAMUSCULAR | Status: AC
  Administered 2017-08-11: 100 mg via SUBCUTANEOUS
  Filled 2017-08-11: qty 4

## 2017-08-11 NOTE — Patient Instructions (Signed)
[facility] Discharge Instructions for Patients Receiving Chemotherapy  Today you received the following chemotherapy agents Vidaza  To help prevent nausea and vomiting after your treatment, we encourage you to take your nausea medication as directed  If you develop nausea and vomiting that is not controlled by your nausea medication, call the clinic.   BELOW ARE SYMPTOMS THAT SHOULD BE REPORTED IMMEDIATELY:  *FEVER GREATER THAN 100.5 F  *CHILLS WITH OR WITHOUT FEVER  NAUSEA AND VOMITING THAT IS NOT CONTROLLED WITH YOUR NAUSEA MEDICATION  *UNUSUAL SHORTNESS OF BREATH  *UNUSUAL BRUISING OR BLEEDING  TENDERNESS IN MOUTH AND THROAT WITH OR WITHOUT PRESENCE OF ULCERS  *URINARY PROBLEMS  *BOWEL PROBLEMS  UNUSUAL RASH Items with * indicate a potential emergency and should be followed up as soon as possible.  Feel free to call the clinic should you have any questions or concerns. The clinic phone number is [phone].  Please show the CHEMO ALERT CARD at check-in to the Emergency Department and triage nurse.   

## 2017-08-12 ENCOUNTER — Ambulatory Visit (HOSPITAL_BASED_OUTPATIENT_CLINIC_OR_DEPARTMENT_OTHER): Payer: Medicare Other

## 2017-08-12 VITALS — BP 125/75 | HR 82 | Temp 98.0°F | Resp 18

## 2017-08-12 DIAGNOSIS — Z5111 Encounter for antineoplastic chemotherapy: Secondary | ICD-10-CM

## 2017-08-12 DIAGNOSIS — Z7189 Other specified counseling: Secondary | ICD-10-CM

## 2017-08-12 DIAGNOSIS — D469 Myelodysplastic syndrome, unspecified: Secondary | ICD-10-CM | POA: Diagnosis not present

## 2017-08-12 MED ORDER — ONDANSETRON HCL 8 MG PO TABS
ORAL_TABLET | ORAL | Status: AC
  Filled 2017-08-12: qty 1

## 2017-08-12 MED ORDER — AZACITIDINE CHEMO SQ INJECTION
50.0000 mg/m2 | Freq: Once | INTRAMUSCULAR | Status: AC
  Administered 2017-08-12: 100 mg via SUBCUTANEOUS
  Filled 2017-08-12: qty 4

## 2017-08-12 MED ORDER — ONDANSETRON HCL 8 MG PO TABS
8.0000 mg | ORAL_TABLET | Freq: Once | ORAL | Status: AC
  Administered 2017-08-12: 8 mg via ORAL

## 2017-08-12 NOTE — Patient Instructions (Signed)
[facility] Discharge Instructions for Patients Receiving Chemotherapy  Today you received the following chemotherapy agents Vidaza  To help prevent nausea and vomiting after your treatment, we encourage you to take your nausea medication as directed  If you develop nausea and vomiting that is not controlled by your nausea medication, call the clinic.   BELOW ARE SYMPTOMS THAT SHOULD BE REPORTED IMMEDIATELY:  *FEVER GREATER THAN 100.5 F  *CHILLS WITH OR WITHOUT FEVER  NAUSEA AND VOMITING THAT IS NOT CONTROLLED WITH YOUR NAUSEA MEDICATION  *UNUSUAL SHORTNESS OF BREATH  *UNUSUAL BRUISING OR BLEEDING  TENDERNESS IN MOUTH AND THROAT WITH OR WITHOUT PRESENCE OF ULCERS  *URINARY PROBLEMS  *BOWEL PROBLEMS  UNUSUAL RASH Items with * indicate a potential emergency and should be followed up as soon as possible.  Feel free to call the clinic should you have any questions or concerns. The clinic phone number is [phone].  Please show the CHEMO ALERT CARD at check-in to the Emergency Department and triage nurse.   

## 2017-08-13 ENCOUNTER — Ambulatory Visit (HOSPITAL_BASED_OUTPATIENT_CLINIC_OR_DEPARTMENT_OTHER): Payer: Medicare Other

## 2017-08-13 VITALS — BP 129/71 | HR 91 | Temp 98.5°F | Resp 18

## 2017-08-13 DIAGNOSIS — Z7189 Other specified counseling: Secondary | ICD-10-CM

## 2017-08-13 DIAGNOSIS — D469 Myelodysplastic syndrome, unspecified: Secondary | ICD-10-CM

## 2017-08-13 DIAGNOSIS — D4620 Refractory anemia with excess of blasts, unspecified: Secondary | ICD-10-CM | POA: Diagnosis not present

## 2017-08-13 DIAGNOSIS — Z5111 Encounter for antineoplastic chemotherapy: Secondary | ICD-10-CM | POA: Diagnosis not present

## 2017-08-13 MED ORDER — AZACITIDINE CHEMO SQ INJECTION
50.0000 mg/m2 | Freq: Once | INTRAMUSCULAR | Status: AC
  Administered 2017-08-13: 100 mg via SUBCUTANEOUS
  Filled 2017-08-13: qty 4

## 2017-08-13 MED ORDER — ONDANSETRON HCL 8 MG PO TABS
8.0000 mg | ORAL_TABLET | Freq: Once | ORAL | Status: AC
  Administered 2017-08-13: 8 mg via ORAL

## 2017-08-13 MED ORDER — ONDANSETRON HCL 8 MG PO TABS
ORAL_TABLET | ORAL | Status: AC
  Filled 2017-08-13: qty 1

## 2017-08-13 NOTE — Patient Instructions (Signed)
[facility] Discharge Instructions for Patients Receiving Chemotherapy  Today you received the following chemotherapy agents:  Vidaza  To help prevent nausea and vomiting after your treatment, we encourage you to take your nausea medication as prescribed.   If you develop nausea and vomiting that is not controlled by your nausea medication, call the clinic.   BELOW ARE SYMPTOMS THAT SHOULD BE REPORTED IMMEDIATELY:  *FEVER GREATER THAN 100.5 F  *CHILLS WITH OR WITHOUT FEVER  NAUSEA AND VOMITING THAT IS NOT CONTROLLED WITH YOUR NAUSEA MEDICATION  *UNUSUAL SHORTNESS OF BREATH  *UNUSUAL BRUISING OR BLEEDING  TENDERNESS IN MOUTH AND THROAT WITH OR WITHOUT PRESENCE OF ULCERS  *URINARY PROBLEMS  *BOWEL PROBLEMS  UNUSUAL RASH Items with * indicate a potential emergency and should be followed up as soon as possible.  Feel free to call the clinic should you have any questions or concerns. The clinic phone number is [phone].  Please show the CHEMO ALERT CARD at check-in to the Emergency Department and triage nurse.   

## 2017-08-18 ENCOUNTER — Telehealth: Payer: Self-pay

## 2017-08-18 NOTE — Telephone Encounter (Signed)
Wife called that pt took the bottle of mag citrate today and has used 2 stool softeners. Asking what else they can do.  Pt had small bm but very hard since last call. He will take another dose miralax and 2 senna-s now.  Explained he can use 2 softeners twice a day if he needs.

## 2017-08-18 NOTE — Telephone Encounter (Signed)
Constipated x 3 days. Last night 2 softeners, this am took tsp miralax. No nausea, no vomiting, doing pretty good with fluids. Discomfort but not pain. Was asking if he could use mag citrate 1/2 bottle. Gave permission with caveat that he may get a little diarrhea.

## 2017-08-20 ENCOUNTER — Telehealth: Payer: Self-pay

## 2017-08-20 ENCOUNTER — Ambulatory Visit (HOSPITAL_BASED_OUTPATIENT_CLINIC_OR_DEPARTMENT_OTHER): Payer: Medicare Other

## 2017-08-20 ENCOUNTER — Other Ambulatory Visit: Payer: Self-pay

## 2017-08-20 ENCOUNTER — Ambulatory Visit (HOSPITAL_BASED_OUTPATIENT_CLINIC_OR_DEPARTMENT_OTHER): Payer: Medicare Other | Admitting: Medical

## 2017-08-20 VITALS — BP 126/66 | HR 85 | Temp 98.6°F | Resp 18

## 2017-08-20 DIAGNOSIS — D649 Anemia, unspecified: Secondary | ICD-10-CM | POA: Diagnosis not present

## 2017-08-20 DIAGNOSIS — D696 Thrombocytopenia, unspecified: Secondary | ICD-10-CM | POA: Diagnosis not present

## 2017-08-20 DIAGNOSIS — D469 Myelodysplastic syndrome, unspecified: Secondary | ICD-10-CM

## 2017-08-20 DIAGNOSIS — R319 Hematuria, unspecified: Secondary | ICD-10-CM

## 2017-08-20 DIAGNOSIS — R58 Hemorrhage, not elsewhere classified: Secondary | ICD-10-CM | POA: Diagnosis not present

## 2017-08-20 LAB — CBC WITH DIFFERENTIAL/PLATELET
BASO%: 0.7 % (ref 0.0–2.0)
BASOS ABS: 0 10*3/uL (ref 0.0–0.1)
EOS ABS: 0 10*3/uL (ref 0.0–0.5)
EOS%: 1.4 % (ref 0.0–7.0)
HEMATOCRIT: 21.9 % — AB (ref 38.4–49.9)
HEMOGLOBIN: 7 g/dL — AB (ref 13.0–17.1)
LYMPH%: 63.7 % — AB (ref 14.0–49.0)
MCH: 28.8 pg (ref 27.2–33.4)
MCHC: 32 g/dL (ref 32.0–36.0)
MCV: 90.1 fL (ref 79.3–98.0)
MONO#: 0.1 10*3/uL (ref 0.1–0.9)
MONO%: 6.8 % (ref 0.0–14.0)
NEUT#: 0.4 10*3/uL — CL (ref 1.5–6.5)
NEUT%: 27.4 % — AB (ref 39.0–75.0)
PLATELETS: 26 10*3/uL — AB (ref 140–400)
RBC: 2.43 10*6/uL — ABNORMAL LOW (ref 4.20–5.82)
RDW: 21.5 % — ABNORMAL HIGH (ref 11.0–14.6)
WBC: 1.5 10*3/uL — ABNORMAL LOW (ref 4.0–10.3)
lymph#: 0.9 10*3/uL (ref 0.9–3.3)
nRBC: 0 % (ref 0–0)

## 2017-08-20 LAB — TECHNOLOGIST REVIEW

## 2017-08-20 LAB — PREPARE RBC (CROSSMATCH)

## 2017-08-20 MED ORDER — ACETAMINOPHEN 325 MG PO TABS
650.0000 mg | ORAL_TABLET | Freq: Once | ORAL | Status: AC
  Administered 2017-08-20: 650 mg via ORAL

## 2017-08-20 MED ORDER — ACETAMINOPHEN 325 MG PO TABS
ORAL_TABLET | ORAL | Status: AC
  Filled 2017-08-20: qty 2

## 2017-08-20 MED ORDER — ACETAMINOPHEN 325 MG PO TABS: 650.0000 mg | ORAL_TABLET | Freq: Once | ORAL | Status: DC

## 2017-08-20 MED ORDER — SODIUM CHLORIDE 0.9 % IV SOLN
Freq: Once | INTRAVENOUS | Status: AC
  Administered 2017-08-20: 13:00:00 via INTRAVENOUS

## 2017-08-20 NOTE — Progress Notes (Signed)
[facility]pt[facility] Progress Note   Jordan Terry [id number] 03/24/1932 83 y.o.  Jordan Terry is managed by Dr. Sullivan Lone  Actively treated: yes  Current Therapy: Vidaza   Last Treated: 08/13/2017  Assessment: Plan:    MDS (myelodysplastic syndrome) ([place]) - Plan: Practitioner attestation of consent, Complete patient signature process for consent form, Care order/instruction, Type and screen, Prepare RBC, Transfuse RBC, acetaminophen (TYLENOL) tablet 650 mg, Prepare Pheresed Platelets, Transfuse platelet pheresis, Prepare RBC, Prepare Pheresed Platelets, 0.9 %  sodium chloride infusion, Transfuse platelet pheresis, Transfuse RBC, CANCELED: Practitioner attestation of consent, CANCELED: Complete patient signature process for consent form, CANCELED: Care order/instruction, CANCELED: Type and screen, CANCELED: Prepare Pheresed Platelets, CANCELED: Transfuse platelet pheresis, CANCELED: Practitioner attestation of consent, CANCELED: Complete patient signature process for consent form, CANCELED: Care order/instruction, CANCELED: Prepare RBC, CANCELED: Transfuse RBC, CANCELED: Practitioner attestation of consent, CANCELED: Complete patient signature process for consent form, CANCELED: Care order/instruction, CANCELED: Type and screen, CANCELED: Prepare RBC, CANCELED: Transfuse RBC, CANCELED: Prepare Pheresed Platelets, CANCELED: Transfuse platelet pheresis  Bleeding - Plan: Practitioner attestation of consent, Complete patient signature process for consent form, Care order/instruction, Type and screen, Prepare RBC, Transfuse RBC, acetaminophen (TYLENOL) tablet 650 mg, Prepare Pheresed Platelets, Transfuse platelet pheresis, Prepare RBC, Prepare Pheresed Platelets, 0.9 %  sodium chloride infusion, Transfuse platelet pheresis, Transfuse RBC, CANCELED: Practitioner attestation of consent, CANCELED: Complete patient signature process for consent form, CANCELED: Care order/instruction,  CANCELED: Type and screen, CANCELED: Prepare Pheresed Platelets, CANCELED: Transfuse platelet pheresis, CANCELED: Practitioner attestation of consent, CANCELED: Complete patient signature process for consent form, CANCELED: Care order/instruction, CANCELED: Prepare RBC, CANCELED: Transfuse RBC, CANCELED: Practitioner attestation of consent, CANCELED: Complete patient signature process for consent form, CANCELED: Care order/instruction, CANCELED: Type and screen, CANCELED: Prepare RBC, CANCELED: Transfuse RBC, CANCELED: Prepare Pheresed Platelets, CANCELED: Transfuse platelet pheresis  Myelodysplastic syndrome ([place]) - Plan: Complete patient signature process for consent form, Practitioner attestation of consent, Practitioner attestation of consent, Practitioner attestation of consent, Practitioner attestation of consent, CBC with Differential, 0.9 %  sodium chloride infusion  Symptomatic anemia - Plan: CBC with Differential, 0.9 %  sodium chloride infusion  Thrombocytopenia ([facility]) - Plan: CBC with Differential, Practitioner attestation of consent, Complete patient signature process for consent form, Care order/instruction, Type and screen, Prepare RBC, Transfuse RBC, acetaminophen (TYLENOL) tablet 650 mg, Prepare Pheresed Platelets, Transfuse platelet pheresis, Prepare RBC, Prepare Pheresed Platelets, 0.9 %  sodium chloride infusion, Transfuse platelet pheresis, Transfuse RBC   MDS with bleeding, symptomatic anemia, and thrombocytopenia: Patient was transfused with 1 unit of CMV negative irradiated packed red cells and 1 unit of CMV negative irradiated platelets today. He will return tomorrow for a second unit of CMV irradiated negative packed red cells tomorrow.  Please see After Visit Summary for patient specific instructions.  Future Appointments Date Time Provider [place]  08/23/2017 11:00 AM [facility] 4 [facility] None  09/06/2017 11:00 AM [facility] 2 [facility] None    09/06/2017 11:45 AM [facility] H31 [facility] None  09/07/2017 9:40 AM Brunetta Genera, MD [facility] None  09/07/2017 10:15 AM [facility] F21 [facility] None  09/08/2017 11:15 AM [facility] H30 [facility] None  09-17-17 11:15 AM [facility] G24 [facility] None  09/10/2017 10:45 AM [facility] B7 [facility] None    Orders Placed This Encounter  Procedures  . CBC with Differential  . Practitioner attestation of consent  . Complete patient signature process for consent form  . Care order/instruction  . Hold Tube, Blood Bank  .  Type and screen  . Prepare RBC  . Prepare Pheresed Platelets       Subjective:   Patient ID:  Jordan Terry is a 83 y.o. (DOB 1932/10/11) male.  Chief Complaint:  Chief Complaint  Patient presents with  . 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 The patient self-catheterizes. He awoke this morning to see blood on the pad and which he sleeps. He presented to his urologist at [facility] this morning. There were 4 attempts to catheterize the patient unsuccessfully. They eventually had to pass a catheter over a wire. He has blood in his collection bag but no clots. He is a patient of Dr. Irene Limbo who had stated that the patient would have platelet transfusions for platelets less than 10,000 or for active bleeding. He was recently hospitalized with bleeding for 7 days.  Medications: I have reviewed the patient's current medications.  Allergies:  Allergies  Allergen Reactions  . Benadryl [Diphenhydramine Hcl] Anxiety    MAKES HIM GO CRAZY  . Hydrocodone Itching    Small amounts are okay    Past Medical History:  Diagnosis Date  . Anginal pain ([place]) 20 years ago  . Anxiety    . Arthritis   . Bilateral tinnitus    wears hearing aides  . Cancer [facility])    prostate  . Grover's disease   . Headache(784.0)    not since retired  . Hematuria 04/14/2017  . Pancytopenia ([place]) 04/19/2017  . Self-catheterizes urinary bladder 04/19/2017  . Vertigo     Past Surgical History:  Procedure Laterality Date  . CARDIAC CATHETERIZATION  "20 years ago"  . CARPAL TUNNEL RELEASE Left 2008  . CATARACT EXTRACTION Bilateral 2014  . CYSTOSCOPY WITH URETHRAL DILATATION N/A 08/14/2013   Procedure: CYSTOSCOPY WITH URETHRAL DILATATION BALLOON DILATION OF URETHRAL STRICTURE;  Surgeon: Dutch Gray, MD;  Location: [facility];  Service: Urology;  Laterality: N/A;  BALLOON DILATION   . CYSTOSCOPY WITH URETHRAL DILATATION N/A 05/07/2014   Procedure: CYSTOSCOPY WITH BALLOON DILATION OF URETHRAL STRICTURE;  Surgeon: Raynelle Bring, MD;  Location: [facility];  Service: Urology;  Laterality: N/A;  [place]   "scope"  . NOSE SURGERY  1991, 1998  . RADIOACTIVE SEED IMPLANT  2002   "for prostate cancer"  . SHOULDER SURGERY  2012   right  . THYROIDECTOMY, PARTIAL  1974  . VASECTOMY  1970    Family History  Problem Relation Age of Onset  . Anxiety disorder Mother   . Cancer Father        colon, lung  . Diabetes Brother   . Cancer Brother   . Diabetes Sister   . Leukemia Sister   . Cancer Sister        breast  . Cancer Sister        breast  . Cancer Sister        thyroid  . Diabetes Sister     Social History   Social History  . Marital status: Married    Spouse name: [name]  . Number of children: 3  . Years of education: college   Occupational History  . Not on file.  Social History Main Topics  . Smoking status: Former Smoker    Years: 10.00    Types: Cigars    Quit date: 1979  . Smokeless tobacco: Never Used  . Alcohol use No  . Drug use: No  . Sexual activity: Not on file   Other Topics Concern  . Not on file    Social History Narrative   Patient is married [name]) and lives at home with his wife.   Patient has three children.   Patient is retired.   Patient has a college education in [place].   Patient is right-handed.   Patient drinks one cup of coffee daily and one 8 oz of tea daily.    Past Medical History, Surgical history, Social history, and Family history were reviewed and updated as appropriate.   Please see review of systems for further details on the patient's review from today.   Review of Systems:  Review of Systems  HENT: Negative for nosebleeds.   Gastrointestinal: Negative for blood in stool.  Genitourinary: Positive for decreased urine volume, difficulty urinating, hematuria and penile pain.    Objective:   Physical Exam:  BP (P) 114/61   Pulse (P) 83   Temp (P) 98.6 F (37 C) (Oral)   Resp (P) 18   SpO2 (P) 100%  ECOG: 1  Physical Exam  Constitutional: No distress.  HENT:  Head: Normocephalic and atraumatic.  Cardiovascular: Normal rate, regular rhythm and normal heart sounds.  Exam reveals no gallop and no friction rub.   No murmur heard. Pulmonary/Chest: Effort normal and breath sounds normal. No respiratory distress. He has no wheezes. He has no rales.  Musculoskeletal: He exhibits no edema.  Neurological: He is alert. Coordination normal.  Skin: Skin is warm and dry. He is not diaphoretic.  Psychiatric: He has a normal mood and affect. His behavior is normal. Judgment and thought content normal.    Lab Review:     Component Value Date/Time   NA 140 08/09/2017 1154   K 3.8 08/09/2017 1154   CL 101 06/23/2017 0323   CO2 27 08/09/2017 1154   GLUCOSE 69 (L) 08/09/2017 1154   BUN 19.9 08/09/2017 1154   CREATININE 1.1 08/09/2017 1154   CALCIUM 9.2 08/09/2017 1154   PROT 7.3 08/09/2017 1154   ALBUMIN 4.1 08/09/2017 1154   AST 17 08/09/2017 1154   ALT 17 08/09/2017 1154   ALKPHOS 48 08/09/2017 1154   BILITOT 0.62 08/09/2017 1154   GFRNONAA 57 (L)  06/23/2017 0323   GFRAA >60 06/23/2017 0323       Component Value Date/Time   WBC 1.5 (L) 08/20/2017 1159   WBC 1.4 (LL) 06/23/2017 0323   RBC 2.43 (L) 08/20/2017 1159   RBC 3.21 (L) 06/23/2017 0323   HGB 7.0 (L) 08/20/2017 1159   HCT 21.9 (L) 08/20/2017 1159   PLT 26 (L) 08/20/2017 1159   MCV 90.1 08/20/2017 1159   MCH 28.8 08/20/2017 1159   MCH 29.9 06/23/2017 0323   MCHC 32.0 08/20/2017 1159   MCHC 34.5 06/23/2017 0323   RDW 21.5 (H) 08/20/2017 1159   LYMPHSABS 0.9 08/20/2017 1159   MONOABS 0.1 08/20/2017 1159   EOSABS 0.0 08/20/2017 1159   BASOSABS 0.0 08/20/2017 1159   -------------------------------  Imaging from last 24 hours (if applicable):  Radiology interpretation: No results found.      This case was discussed with Dr. Irene Limbo. He expresses agreement with my management of this patient.

## 2017-08-20 NOTE — Telephone Encounter (Signed)
Received phone call from [name], pt daughter, that pt was able to get permanent catheter at [facility]. At that time, she was on the way to [facility] to meet her parents. Per pt wife, pt was still having some bleeding. Pt and wife arrived [facility] as walk-in. Discussed with Beth, symptom management RN. Appt placed for labs. CBC and Type/Screen placed.

## 2017-08-20 NOTE — Patient Instructions (Signed)

## 2017-08-20 NOTE — Progress Notes (Signed)
Tolerated platelet transfusion well, w/o evidence of reaction. Has been to bathroom x2 to empty leg bag of blood tinged urine. No clots noted.  Pt had 14 fr. Catheter inserted at [facility]. According to pt it took several attempts to get catheter in place.  Pt states he is to return to Urology next Thursday.  Completed 1 unit prbc's. Tolerated well. VSS No evidence of reaction. Educated on what to look for as far as late blood reaction at home.  Up to the bathroom 3 more times to empty leg bag. By last trip to bathroom , urine appears to be lighter in color. Pt expressed relief seeing that the bleeding seems to be slowing down.  Discussed at length regarding what to do at home regarding emptying leg bag, watching for clots and any decrease in urine output, feeling like his bladder is not emptying adequately, fever, chills. Advised pt to push fluids at home to keep urine flowing. Gave pt urinal to empty urine into at night.   Pt to return to cancer center tomorrow morning at 8-8:30 am to receive his 2nd unit of blood.  Instructed to keep blue blood bank armband on.  Advised pt that if he experienced any problems with his catheter, increased  Bleeding, clotting of catheter, fever, chills etc to go to ED. Pt voiced understanding.  TCT pt's wife to let her know that pt is ready to be picked up to go home. Pt's son-in-law will pick him up.  Escorted to lobby to wait for ride.

## 2017-08-20 NOTE — Telephone Encounter (Signed)
Pt called this morning noting bleeding with self-catheterization. Asked the pt how much blood he noted and he said "4 tbsp worth". Pt said, "I do not think this is a lot of blood, but I am having trouble urinating and last time I attempted to catheterize myself after noticing some bleeding I ended up in the hospital because I was bleeding continually." Pt plan to go to [facility] this morning so that they can perform catheterization. Pt wife on the way back to the house from her walk to take pt to [facility]. Asked the pt if he had called their office to inform them of their expected arrival. Pt said he was unable to reach anyone at the office.   Called [facility] on the physician line and spoke with [place]. After explaining the situation to her she said they could get him in at [place] with Diane. Pt stated that they lived only 20 minutes away, so this should work okay. Attempt to call pt back at home and on wife's mobile phone with no answer and the voicemail has not yet been set up.

## 2017-08-21 ENCOUNTER — Encounter (HOSPITAL_COMMUNITY): Payer: Self-pay | Admitting: *Deleted

## 2017-08-21 ENCOUNTER — Emergency Department (HOSPITAL_COMMUNITY): Payer: Medicare Other

## 2017-08-21 ENCOUNTER — Ambulatory Visit: Payer: Medicare Other

## 2017-08-21 ENCOUNTER — Other Ambulatory Visit: Payer: Self-pay

## 2017-08-21 ENCOUNTER — Encounter (HOSPITAL_COMMUNITY): Payer: Self-pay | Admitting: Emergency Medicine

## 2017-08-21 ENCOUNTER — Emergency Department (HOSPITAL_COMMUNITY)
Admission: EM | Admit: 2017-08-21 | Discharge: 2017-08-21 | Disposition: A | Discharge status: Home / Self Care | Payer: Medicare Other | Source: Home / Self Care | Attending: Emergency Medicine | Admitting: Emergency Medicine

## 2017-08-21 ENCOUNTER — Emergency Department (HOSPITAL_COMMUNITY)
Admission: EM | Admit: 2017-08-21 | Discharge: 2017-08-21 | Disposition: A | Discharge status: Other Acute Inpatient Hospital | Payer: Medicare Other | Attending: Emergency Medicine | Admitting: Emergency Medicine

## 2017-08-21 ENCOUNTER — Other Ambulatory Visit (HOSPITAL_BASED_OUTPATIENT_CLINIC_OR_DEPARTMENT_OTHER): Payer: Medicare Other

## 2017-08-21 DIAGNOSIS — D696 Thrombocytopenia, unspecified: Secondary | ICD-10-CM

## 2017-08-21 DIAGNOSIS — Y69 Unspecified misadventure during surgical and medical care: Secondary | ICD-10-CM | POA: Diagnosis not present

## 2017-08-21 DIAGNOSIS — Z79899 Other long term (current) drug therapy: Secondary | ICD-10-CM | POA: Insufficient documentation

## 2017-08-21 DIAGNOSIS — Z87891 Personal history of nicotine dependence: Secondary | ICD-10-CM | POA: Insufficient documentation

## 2017-08-21 DIAGNOSIS — D649 Anemia, unspecified: Secondary | ICD-10-CM | POA: Diagnosis not present

## 2017-08-21 DIAGNOSIS — T83091A Other mechanical complication of indwelling urethral catheter, initial encounter: Secondary | ICD-10-CM | POA: Diagnosis not present

## 2017-08-21 DIAGNOSIS — R301 Vesical tenesmus: Secondary | ICD-10-CM | POA: Diagnosis not present

## 2017-08-21 DIAGNOSIS — D61818 Other pancytopenia: Secondary | ICD-10-CM | POA: Diagnosis not present

## 2017-08-21 DIAGNOSIS — D469 Myelodysplastic syndrome, unspecified: Secondary | ICD-10-CM | POA: Insufficient documentation

## 2017-08-21 DIAGNOSIS — N3289 Other specified disorders of bladder: Secondary | ICD-10-CM | POA: Insufficient documentation

## 2017-08-21 DIAGNOSIS — R339 Retention of urine, unspecified: Secondary | ICD-10-CM | POA: Diagnosis present

## 2017-08-21 DIAGNOSIS — I493 Ventricular premature depolarization: Secondary | ICD-10-CM | POA: Insufficient documentation

## 2017-08-21 DIAGNOSIS — Z8546 Personal history of malignant neoplasm of prostate: Secondary | ICD-10-CM | POA: Diagnosis not present

## 2017-08-21 LAB — TYPE AND SCREEN
ABO/RH(D): A POS
ANTIBODY SCREEN: NEGATIVE
UNIT DIVISION: 0
Unit division: 0

## 2017-08-21 LAB — BASIC METABOLIC PANEL
Anion gap: 8 (ref 5–15)
BUN: 19 mg/dL (ref 6–20)
CALCIUM: 9.3 mg/dL (ref 8.9–10.3)
CO2: 29 mmol/L (ref 22–32)
Chloride: 105 mmol/L (ref 101–111)
Creatinine, Ser: 1.01 mg/dL (ref 0.61–1.24)
GFR calc Af Amer: >60 mL/min (ref >60)
GLUCOSE: 106 mg/dL — AB (ref 65–99)
Potassium: 4.1 mmol/L (ref 3.5–5.1)
Sodium: 142 mmol/L (ref 135–145)

## 2017-08-21 LAB — BPAM RBC
BLOOD PRODUCT EXPIRATION DATE: 201811122359
Blood Product Expiration Date: 201811122359
ISSUE DATE / TIME: 201810261527
UNIT TYPE AND RH: 6200
Unit Type and Rh: 6200

## 2017-08-21 LAB — CBC WITH DIFFERENTIAL/PLATELET
BASOS ABS: 0 10*3/uL (ref 0.0–0.1)
BASOS PCT: 1 %
Basophils Absolute: 0 10*3/uL (ref 0.0–0.1)
Basophils Relative: 0 %
EOS PCT: 2 %
EOS PCT: 1 %
Eosinophils Absolute: 0 10*3/uL (ref 0.0–0.7)
Eosinophils Absolute: 0 10*3/uL (ref 0.0–0.7)
HCT: 26 % — ABNORMAL LOW (ref 39.0–52.0)
HEMATOCRIT: 22.4 % — AB (ref 39.0–52.0)
HEMOGLOBIN: 8.9 g/dL — AB (ref 13.0–17.0)
Hemoglobin: 7.6 g/dL — ABNORMAL LOW (ref 13.0–17.0)
LYMPHS ABS: 1.3 10*3/uL (ref 0.7–4.0)
LYMPHS ABS: 0.7 10*3/uL (ref 0.7–4.0)
Lymphocytes Relative: 63 %
Lymphocytes Relative: 37 %
MCH: 29.8 pg (ref 26.0–34.0)
MCH: 29.7 pg (ref 26.0–34.0)
MCHC: 33.9 g/dL (ref 30.0–36.0)
MCHC: 34.2 g/dL (ref 30.0–36.0)
MCV: 87.8 fL (ref 78.0–100.0)
MCV: 86.7 fL (ref 78.0–100.0)
MONO ABS: 0.1 10*3/uL (ref 0.1–1.0)
MONOS PCT: 10 %
Monocytes Absolute: 0.2 10*3/uL (ref 0.1–1.0)
Monocytes Relative: 6 %
NEUTROS ABS: 0.5 10*3/uL — AB (ref 1.7–7.7)
NEUTROS PCT: 52 %
Neutro Abs: 1 10*3/uL — ABNORMAL LOW (ref 1.7–7.7)
Neutrophils Relative %: 28 %
PLATELETS: 66 10*3/uL — AB (ref 150–400)
PLATELETS: 47 10*3/uL — AB (ref 150–400)
RBC: 2.55 MIL/uL — AB (ref 4.22–5.81)
RBC: 3 MIL/uL — AB (ref 4.22–5.81)
RDW: 21 % — AB (ref 11.5–15.5)
RDW: 19.8 % — ABNORMAL HIGH (ref 11.5–15.5)
WBC: 1.9 10*3/uL — AB (ref 4.0–10.5)
WBC: 1.9 10*3/uL — AB (ref 4.0–10.5)

## 2017-08-21 LAB — PREPARE PLATELET PHERESIS: Unit division: 0

## 2017-08-21 LAB — POC OCCULT BLOOD, ED: FECAL OCCULT BLD: NEGATIVE

## 2017-08-21 LAB — BPAM PLATELET PHERESIS
Blood Product Expiration Date: 201810272359
ISSUE DATE / TIME: 201810261415
UNIT TYPE AND RH: 5100

## 2017-08-21 LAB — I-STAT TROPONIN, ED: Troponin i, poc: 0.01 ng/mL (ref 0.00–0.08)

## 2017-08-21 LAB — PREPARE RBC (CROSSMATCH)

## 2017-08-21 MED ORDER — ACETAMINOPHEN 325 MG PO TABS
650.0000 mg | ORAL_TABLET | Freq: Once | ORAL | Status: AC
  Administered 2017-08-21: 650 mg via ORAL

## 2017-08-21 MED ORDER — SODIUM CHLORIDE 0.9 % IV SOLN
250.0000 mL | Freq: Once | INTRAVENOUS | Status: AC
  Administered 2017-08-21: 250 mL via INTRAVENOUS

## 2017-08-21 MED ORDER — BELLADONNA ALKALOIDS-OPIUM 16.2-60 MG RE SUPP: 1.0000 | Freq: Once | RECTAL | Status: DC

## 2017-08-21 MED ORDER — OXYCODONE-ACETAMINOPHEN 5-325 MG PO TABS
1.0000 | ORAL_TABLET | Freq: Once | ORAL | Status: AC
  Administered 2017-08-21: 1 via ORAL
  Filled 2017-08-21: qty 1

## 2017-08-21 MED ORDER — ACETAMINOPHEN 325 MG PO TABS
ORAL_TABLET | ORAL | Status: AC
  Filled 2017-08-21: qty 2

## 2017-08-21 NOTE — Discharge Instructions (Signed)
Continue oxybutynin, Azo and Percocet, as needed for spasm.  Call your urologist at [facility] if having significant discomfort in 2 days .  You can take Senokot to help with constipation as needed

## 2017-08-21 NOTE — ED Provider Notes (Addendum)
[place] DEPT Provider Note: Georgena Spurling, MD, FACEP  CSN: [id number] MRN: [id number] ARRIVAL: 08/21/17 at [place]: [name]  Urinary Retention   HISTORY OF PRESENT ILLNESS  08/21/17 5:30 AM Jordan Terry is a 83 y.o. male who self catheterizes his bladder twice daily.  He was unable to successfully catheterize himself yesterday and so was seen by his urologist.  His urologist had to use a wire to place a Foley catheter which resulted in grossly bloody urine.  He was brought by EMS this morning due to severe bladder spasms and inability to empty his bladder.  His Foley catheter was irrigated by nursing staff prior to my evaluation with clearance of clots.  He has subsequently been able to pass urine through his Foley though it continues to be grossly bloody.  He is still having an occasional bladder spasm.  EMS reports frequent PVCs seen on the monitor prior to arrival.  The patient denies palpitations, chest pain or shortness of breath.  He has no known history of an arrhythmia.  He has pancytopenia due to myelodysplastic syndrome.  He received 1 unit of platelets and 1 unit of packed red blood cells at the cancer center yesterday.  He is scheduled to have another unit of packed red blood cells transfused at 8:00 this morning at the cancer center.   Past Medical History:  Diagnosis Date  . Anginal pain ([place]) 20 years ago  . Anxiety   . Arthritis   . Bilateral tinnitus    wears hearing aides  . Cancer [facility])    prostate  . Grover's disease   . Headache(784.0)    not since retired  . Hematuria 04/14/2017  . Pancytopenia ([place]) 04/19/2017  . Self-catheterizes urinary bladder 04/19/2017  . Vertigo     Past Surgical History:  Procedure Laterality Date  . CARDIAC CATHETERIZATION  "20 years ago"  . CARPAL TUNNEL RELEASE Left 2008  . CATARACT EXTRACTION Bilateral 2014  . CYSTOSCOPY WITH URETHRAL DILATATION N/A 08/14/2013   Procedure: CYSTOSCOPY WITH  URETHRAL DILATATION BALLOON DILATION OF URETHRAL STRICTURE;  Surgeon: Dutch Gray, MD;  Location[facility];  Service: Urology;  Laterality: N/A;  BALLOON DILATION   . CYSTOSCOPY WITH URETHRAL DILATATION N/A 05/07/2014   Procedure: CYSTOSCOPY WITH BALLOON DILATION OF URETHRAL STRICTURE;  Surgeon: Raynelle Bring, MD;  Location[facility];  Service: Urology;  Laterality: N/A;  [place]   "scope"  . NOSE SURGERY  1991, 1998  . RADIOACTIVE SEED IMPLANT  2002   "for prostate cancer"  . SHOULDER SURGERY  2012   right  . THYROIDECTOMY, PARTIAL  1974  . VASECTOMY  1970    Family History  Problem Relation Age of Onset  . Anxiety disorder Mother   . Cancer Father        colon, lung  . Diabetes Brother   . Cancer Brother   . Diabetes Sister   . Leukemia Sister   . Cancer Sister        breast  . Cancer Sister        breast  . Cancer Sister        thyroid  . Diabetes Sister     Social History  Substance Use Topics  . Smoking status: Former Smoker    Years: 10.00    Types: Cigars    Quit date: 1979  . Smokeless tobacco: Never Used  .  Alcohol use No    Prior to Admission medications   Medication Sig Start Date End Date Taking? Authorizing Provider  acyclovir (ZOVIRAX) 400 MG tablet Take 1 tablet (400 mg total) by mouth daily. 08/10/17  Yes Brunetta Genera, MD  fluconazole (DIFLUCAN) 200 MG tablet Take 1 tablet (200 mg total) by mouth daily. 08/10/17  Yes Brunetta Genera, MD  gabapentin (NEURONTIN) 100 MG capsule Take 200 mg by mouth 3 (three) times daily.    Yes [provider]  LORazepam (ATIVAN) 0.5 MG tablet Take 1 mg by mouth at bedtime.   Yes [provider]  nortriptyline (PAMELOR) 25 MG capsule Take 1 capsule (25 mg total) by mouth at bedtime. 01/19/17  Yes Dennie Bible, NP  ondansetron (ZOFRAN) 8 MG tablet Take 1 tablet (8 mg total) by mouth 2 (two) times daily as needed (Nausea or  vomiting). 05/17/17  Yes Brunetta Genera, MD  oxyCODONE-acetaminophen (PERCOCET) 7.5-325 MG per tablet Take 0.5-1 tablets by mouth every 8 (eight) hours as needed (pain.).    Yes [provider]  polyethylene glycol (MIRALAX / GLYCOLAX) packet Take 17 g by mouth 2 (two) times daily. 07/14/17  Yes Brunetta Genera, MD  prochlorperazine (COMPAZINE) 10 MG tablet Take 1 tablet (10 mg total) by mouth every 6 (six) hours as needed (Nausea or vomiting). 05/17/17  Yes Brunetta Genera, MD  senna-docusate (SENOKOT-S) 8.6-50 MG tablet Take 2 tablets by mouth 2 (two) times daily. 06/23/17  Yes Dessa Phi Chahn-Yang, DO  temazepam (RESTORIL) 15 MG capsule Take 15 mg by mouth at bedtime.   Yes [provider]  triamcinolone cream (KENALOG) 0.1 % Apply 1 application topically every morning. APPLY TO CHEST AND BACK EVERY MORNING FOR Ages DISEASE   Yes [provider]    Allergies Benadryl [diphenhydramine hcl] and Hydrocodone   REVIEW OF SYSTEMS  Negative except as noted here or in the History of Present Illness.   PHYSICAL EXAMINATION  Initial Vital Signs Blood pressure 122/79, pulse 84, temperature 98 F (36.7 C), temperature source Oral, resp. rate (!) 54, height 6\' 2"  (1.88 m), weight 72.6 kg (160 lb), SpO2 98 %.  Examination General: Well-developed, well-nourished male in no acute distress; appearance consistent with age of record HENT: normocephalic; atraumatic Eyes: pupils equal, round and reactive to light; extraocular muscles intact; lens implants Neck: supple Heart: regular rate and rhythm; frequent PVCs Lungs: clear to auscultation bilaterally Abdomen: soft; nondistended; nontender; bowel sounds present GU: Tanner V male, circumcised; Foley catheter in place draining grossly bloody urine Extremities: No deformity; full range of motion; pulses normal Neurologic: Awake, alert and oriented; motor function intact in all extremities and symmetric; no  facial droop; hard of hearing Skin: Warm and dry Psychiatric: Normal mood and affect   RESULTS  Summary of this visit's results, reviewed by myself:   EKG Interpretation  Date/Time:  Saturday August 21 2017 02:58:42 EDT Ventricular Rate:  85 PR Interval:    QRS Duration: 96 QT Interval:  375 QTC Calculation: 446 R Axis:   82 Text Interpretation:  Sinus rhythm Multiple ventricular premature complexes Borderline right axis deviation PVCs not seen previously Confirmed by Ileane Sando [phone]) on 08/21/2017 5:28:45 AM      Laboratory Studies: Results for orders placed or performed during the hospital encounter of 08/21/17 (from the past 24 hour(s))  CBC with Differential/Platelet     Status: Abnormal   Collection Time: 08/21/17  3:09 AM  Result Value Ref Range  WBC 1.9 (L) 4.0 - 10.5 K/uL   RBC 2.55 (L) 4.22 - 5.81 MIL/uL   Hemoglobin 7.6 (L) 13.0 - 17.0 g/dL   HCT 22.4 (L) 39.0 - 52.0 %   MCV 87.8 78.0 - 100.0 fL   MCH 29.8 26.0 - 34.0 pg   MCHC 33.9 30.0 - 36.0 g/dL   RDW 21.0 (H) 11.5 - 15.5 %   Platelets 66 (L) 150 - 400 K/uL   Neutrophils Relative % 28 %   Lymphocytes Relative 63 %   Monocytes Relative 6 %   Eosinophils Relative 2 %   Basophils Relative 1 %   Neutro Abs 0.5 (L) 1.7 - 7.7 K/uL   Lymphs Abs 1.3 0.7 - 4.0 K/uL   Monocytes Absolute 0.1 0.1 - 1.0 K/uL   Eosinophils Absolute 0.0 0.0 - 0.7 K/uL   Basophils Absolute 0.0 0.0 - 0.1 K/uL   RBC Morphology POLYCHROMASIA PRESENT   Basic metabolic panel     Status: Abnormal   Collection Time: 08/21/17  3:09 AM  Result Value Ref Range   Sodium 142 135 - 145 mmol/L   Potassium 4.1 3.5 - 5.1 mmol/L   Chloride 105 101 - 111 mmol/L   CO2 29 22 - 32 mmol/L   Glucose, Bld 106 (H) 65 - 99 mg/dL   BUN 19 6 - 20 mg/dL   Creatinine, Ser 1.01 0.61 - 1.24 mg/dL   Calcium 9.3 8.9 - 10.3 mg/dL   GFR calc non Af Amer >60 >60 mL/min   GFR calc Af Amer >60 >60 mL/min   Anion gap 8 5 - 15  I-Stat Troponin, ED (not at [facility])      Status: None   Collection Time: 08/21/17  3:17 AM  Result Value Ref Range   Troponin i, poc 0.01 0.00 - 0.08 ng/mL   Comment 3          Type and screen [place]     Status: None   Collection Time: 08/21/17  3:55 AM  Result Value Ref Range   ABO/RH(D) A POS    Antibody Screen NEG    Sample Expiration 08/24/2017    Imaging Studies: No results found.  ED COURSE  Nursing notes and initial vitals signs, including pulse oximetry, reviewed.  Vitals:   08/21/17 0259 08/21/17 0301 08/21/17 0511  BP: 113/90  122/79  Pulse: 86  84  Resp: 17  (!) 54  Temp: 98 F (36.7 C)    TempSrc: Oral    SpO2: 100%  98%  Weight:  72.6 kg (160 lb)   Height:  6\' 2"  (1.88 m)    6:14 AM Patient will be transferred to the cancer center at 8 AM for his scheduled transfusion.  He was advised of the PVCs seen on his rhythm strip.  These are asymptomatic.  He has had no sustained runs while in the ED.  He was advised to follow-up with his primary care physician for further evaluation.  PROCEDURES    ED DIAGNOSES     ICD-10-CM   1. Obstruction of Foley catheter, initial encounter ([place]) T83.091A   2. PVC's (premature ventricular contractions) I49.3        Sahithi Ordoyne, MD 08/21/17 0557    Shanon Rosser, MD 08/21/17 [phone]

## 2017-08-21 NOTE — ED Provider Notes (Addendum)
[place] DEPT Provider Note   CSN: [id number] Arrival date & time: 08/21/17  1610     History   Chief Complaint Chief Complaint  Patient presents with  . bladder spasms    HPI Jordan Terry is a 83 y.o. male.  Complains of suprapubic pain which feels like spasm onset 2 days ago intermittent last for several seconds at a time.  He has been prescribed oxybutynin by urologist he took a double dose today, without adequate relief.  He also takes Percocet for pain without relief.  Pain comes several times per hour.  Patient also feels somewhat constipated though he had a bowel movement earlier today.  No other associated symptoms.  No vomiting.  Patient had Foley catheter placed 2 days ago has had gross hematuria since Foley placed.  Normally self catheterizes however had urethral stricture and had urethral dilatation performed 2 days ago and Foley catheter placed.  He was placed on Cipro since Foley catheter placed.  Patient also received blood transfusion this morning for palliative reasons.  He suffers from myelodysplastic syndrome  HPI  Past Medical History:  Diagnosis Date  . Anginal pain ([place]) 20 years ago  . Anxiety   . Arthritis   . Bilateral tinnitus    wears hearing aides  . Cancer [facility])    prostate  . Grover's disease   . Headache(784.0)    not since retired  . Hematuria 04/14/2017  . Pancytopenia ([place]) 04/19/2017  . Self-catheterizes urinary bladder 04/19/2017  . Vertigo     Patient Active Problem List   Diagnosis Date Noted  . Counseling regarding advanced care planning and goals of care 08/05/2017  . Anemia 06/22/2017  . Constipation 06/22/2017  . Myelodysplastic syndrome ([place]) 05/07/2017  . Gross hematuria 04/26/2017  . 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 Dyspnea 04/26/2017  . NHL (non-Hodgkin's lymphoma) ([place])   . Urethral bleeding 04/19/2017  . Sepsis ([place]) 03/09/2016  . Prostate cancer ([place]) 03/08/2016  . Hematuria  03/08/2016  . Fever and chills 03/08/2016  . Fever 03/08/2016  . Abnormality of gait 05/31/2013  . Dizziness and giddiness 05/31/2013    Past Surgical History:  Procedure Laterality Date  . CARDIAC CATHETERIZATION  "20 years ago"  . CARPAL TUNNEL RELEASE Left 2008  . CATARACT EXTRACTION Bilateral 2014  . CYSTOSCOPY WITH URETHRAL DILATATION N/A 08/14/2013   Procedure: CYSTOSCOPY WITH URETHRAL DILATATION BALLOON DILATION OF URETHRAL STRICTURE;  Surgeon: Dutch Gray, MD;  Location: [facility] ORS;  Service: Urology;  Laterality: N/A;  BALLOON DILATION   . CYSTOSCOPY WITH URETHRAL DILATATION N/A 05/07/2014   Procedure: CYSTOSCOPY WITH BALLOON DILATION OF URETHRAL STRICTURE;  Surgeon: Raynelle Bring, MD;  Location: [facility] ORS;  Service: Urology;  Laterality: N/A;  [place]   "scope"  . NOSE SURGERY  1991, 1998  . RADIOACTIVE SEED IMPLANT  2002   "for prostate cancer"  . SHOULDER SURGERY  2012   right  . THYROIDECTOMY, PARTIAL  1974  . VASECTOMY  1970       Home Medications    Prior to Admission medications   Medication Sig Start Date End Date Taking? Authorizing Provider  acyclovir (ZOVIRAX) 400 MG tablet Take 1 tablet (400 mg total) by mouth daily. 08/10/17   Brunetta Genera, MD  fluconazole (DIFLUCAN) 200 MG tablet Take 1 tablet (200 mg total) by mouth daily. 08/10/17   Brunetta Genera, MD  gabapentin (NEURONTIN)  100 MG capsule Take 200 mg by mouth 3 (three) times daily.     [provider]  LORazepam (ATIVAN) 0.5 MG tablet Take 1 mg by mouth at bedtime.    [provider]  nortriptyline (PAMELOR) 25 MG capsule Take 1 capsule (25 mg total) by mouth at bedtime. 01/19/17   Dennie Bible, NP  ondansetron (ZOFRAN) 8 MG tablet Take 1 tablet (8 mg total) by mouth 2 (two) times daily as needed (Nausea or vomiting). 05/17/17   Brunetta Genera, MD  oxyCODONE-acetaminophen (PERCOCET) 7.5-325 MG per tablet  Take 0.5-1 tablets by mouth every 8 (eight) hours as needed (pain.).     [provider]  polyethylene glycol (MIRALAX / GLYCOLAX) packet Take 17 g by mouth 2 (two) times daily. 07/14/17   Brunetta Genera, MD  prochlorperazine (COMPAZINE) 10 MG tablet Take 1 tablet (10 mg total) by mouth every 6 (six) hours as needed (Nausea or vomiting). 05/17/17   Brunetta Genera, MD  senna-docusate (SENOKOT-S) 8.6-50 MG tablet Take 2 tablets by mouth 2 (two) times daily. 06/23/17   Dessa Phi Chahn-Yang, DO  temazepam (RESTORIL) 15 MG capsule Take 15 mg by mouth at bedtime.    [provider]  triamcinolone cream (KENALOG) 0.1 % Apply 1 application topically every morning. APPLY TO CHEST AND BACK EVERY MORNING FOR [place] DISEASE    [provider]    Family History Family History  Problem Relation Age of Onset  . Anxiety disorder Mother   . Cancer Father        colon, lung  . Diabetes Brother   . Cancer Brother   . Diabetes Sister   . Leukemia Sister   . Cancer Sister        breast  . Cancer Sister        breast  . Cancer Sister        thyroid  . Diabetes Sister     Social History Social History  Substance Use Topics  . Smoking status: Former Smoker    Years: 10.00    Types: Cigars    Quit date: 1979  . Smokeless tobacco: Never Used  . Alcohol use No     Allergies   Benadryl [diphenhydramine hcl] and Hydrocodone   Review of Systems Review of Systems  Gastrointestinal: Positive for abdominal pain and constipation.       Suprapubic pain  Genitourinary: Positive for hematuria.       Bladder spasm.  Foley catheter in place  All other systems reviewed and are negative.    Physical Exam Updated Vital Signs BP (!) 158/86 (BP Location: Right Arm)   Pulse (!) 104   Temp 98.5 F (36.9 C) (Oral)   Resp 18   SpO2 100%   Physical Exam  Constitutional: He appears well-developed and well-nourished. No distress.  HENT:  Head: Normocephalic and  atraumatic.  Eyes: Pupils are equal, round, and reactive to light. Conjunctivae are normal.  Neck: Neck supple. No tracheal deviation present. No thyromegaly present.  Cardiovascular: Normal rate and regular rhythm.   No murmur heard. Mildly tachycardic pulse, rate 108 bpm  Pulmonary/Chest: Effort normal and breath sounds normal.  Abdominal: Soft. Bowel sounds are normal. He exhibits no distension. There is no tenderness.  Genitourinary: Rectum normal. Rectal exam shows guaiac negative stool.  Genitourinary Comments: Foley catheter in place, grossly bloody urine draining from catheter and rectal normal tone brown stool no gross blood  Musculoskeletal: Normal range of motion. He  exhibits no edema or tenderness.  Neurological: He is alert. Coordination normal.  Skin: Skin is warm and dry. No rash noted.  Psychiatric: He has a normal mood and affect.  Nursing note and vitals reviewed.    ED Treatments / Results  Labs (all labs ordered are listed, but only abnormal results are displayed) Labs Reviewed  CBC WITH DIFFERENTIAL/PLATELET  POC OCCULT BLOOD, ED    EKG  EKG Interpretation None       Radiology No results found.  Procedures Procedures (including critical care time)  Medications Ordered in ED Medications - No data to display  Results for orders placed or performed during the hospital encounter of 08/21/17  CBC with Differential/Platelet  Result Value Ref Range   WBC 1.9 (L) 4.0 - 10.5 K/uL   RBC 3.00 (L) 4.22 - 5.81 MIL/uL   Hemoglobin 8.9 (L) 13.0 - 17.0 g/dL   HCT 26.0 (L) 39.0 - 52.0 %   MCV 86.7 78.0 - 100.0 fL   MCH 29.7 26.0 - 34.0 pg   MCHC 34.2 30.0 - 36.0 g/dL   RDW 19.8 (H) 11.5 - 15.5 %   Platelets 47 (L) 150 - 400 K/uL   Neutrophils Relative % PENDING %   Neutro Abs PENDING 1.7 - 7.7 K/uL   Band Neutrophils PENDING %   Lymphocytes Relative PENDING %   Lymphs Abs PENDING 0.7 - 4.0 K/uL   Monocytes Relative PENDING %   Monocytes Absolute PENDING  0.1 - 1.0 K/uL   Eosinophils Relative PENDING %   Eosinophils Absolute PENDING 0.0 - 0.7 K/uL   Basophils Relative PENDING %   Basophils Absolute PENDING 0.0 - 0.1 K/uL   WBC Morphology PENDING    RBC Morphology PENDING    Smear Review PENDING    nRBC PENDING 0 /100 WBC   Metamyelocytes Relative PENDING %   Myelocytes PENDING %   Promyelocytes Absolute PENDING %   Blasts PENDING %  POC occult blood, ED  Result Value Ref Range   Fecal Occult Bld NEGATIVE NEGATIVE   Dg Abd Acute W/chest  Result Date: 08/21/2017 CLINICAL DATA:  Abdominal pain. EXAM: DG ABDOMEN ACUTE W/ 1V CHEST COMPARISON:  June 22, 2017. FINDINGS: There is no evidence of dilated bowel loops or free intraperitoneal air. No radiopaque calculi or other significant radiographic abnormality is seen. Unchanged prostatic seed implants. Degenerative changes of the lumbar spine. Heart size and mediastinal contours are within normal limits. Both lungs are clear. IMPRESSION: Negative abdominal radiographs.  No acute cardiopulmonary disease. Electronically Signed   By: Titus Dubin M.D.   On: 08/21/2017 18:03  X-rays reviewed by me Results for orders placed or performed during the hospital encounter of 08/21/17  CBC with Differential/Platelet  Result Value Ref Range   WBC 1.9 (L) 4.0 - 10.5 K/uL   RBC 3.00 (L) 4.22 - 5.81 MIL/uL   Hemoglobin 8.9 (L) 13.0 - 17.0 g/dL   HCT 26.0 (L) 39.0 - 52.0 %   MCV 86.7 78.0 - 100.0 fL   MCH 29.7 26.0 - 34.0 pg   MCHC 34.2 30.0 - 36.0 g/dL   RDW 19.8 (H) 11.5 - 15.5 %   Platelets 47 (L) 150 - 400 K/uL   Neutrophils Relative % PENDING %   Neutro Abs PENDING 1.7 - 7.7 K/uL   Band Neutrophils PENDING %   Lymphocytes Relative PENDING %   Lymphs Abs PENDING 0.7 - 4.0 K/uL   Monocytes Relative PENDING %   Monocytes Absolute PENDING 0.1 -  1.0 K/uL   Eosinophils Relative PENDING %   Eosinophils Absolute PENDING 0.0 - 0.7 K/uL   Basophils Relative PENDING %   Basophils Absolute PENDING  0.0 - 0.1 K/uL   WBC Morphology PENDING    RBC Morphology PENDING    Smear Review PENDING    nRBC PENDING 0 /100 WBC   Metamyelocytes Relative PENDING %   Myelocytes PENDING %   Promyelocytes Absolute PENDING %   Blasts PENDING %  POC occult blood, ED  Result Value Ref Range   Fecal Occult Bld NEGATIVE NEGATIVE   Dg Abd Acute W/chest  Result Date: 08/21/2017 CLINICAL DATA:  Abdominal pain. EXAM: DG ABDOMEN ACUTE W/ 1V CHEST COMPARISON:  June 22, 2017. FINDINGS: There is no evidence of dilated bowel loops or free intraperitoneal air. No radiopaque calculi or other significant radiographic abnormality is seen. Unchanged prostatic seed implants. Degenerative changes of the lumbar spine. Heart size and mediastinal contours are within normal limits. Both lungs are clear. IMPRESSION: Negative abdominal radiographs.  No acute cardiopulmonary disease. Electronically Signed   By: Titus Dubin M.D.   On: 08/21/2017 18:03   Initial Impression / Assessment and Plan / ED Course  I have reviewed the triage vital signs and the nursing notes.  Pertinent labs & imaging results that were available during my care of the patient were reviewed by me and considered in my medical decision making (see chart for details). 7:20 PM he does not feel improved after treatment with Percocet.  I discussed case with Dr. Lovena Neighbours who suggested B &  O suppository(belladonna and opium) however patient states that his oncologist told him that he cannot take suppositories. Plan continue Percocet Azo and Cipro.  Senokot as needed for constipation follow-up with urology if significant pain by 08/23/2017      Final Clinical Impressions(s) / ED Diagnoses  Diagnoses #1 bladder spasms #2 anemia Final diagnoses:  None    New Prescriptions New Prescriptions   No medications on file     Orlie Dakin, MD 08/21/17 Schlusser, [place], MD 08/21/17 2325

## 2017-08-21 NOTE — ED Notes (Signed)
Prior to hand irrigating catheter pt had 125cc of cherry red urine noted in leg bag. Also present in bag were two small blood clots. Hand irrigation performed with no clots noted. Leg bag changed at this time.

## 2017-08-21 NOTE — Progress Notes (Signed)
Pt requesting to have foley leg bag changed due to frequent trips to the bathroom. Pt states that he has not had sleep since last night when he was in the ED. Replaced leg foley bag to a bigger urinary foley bag. Adequate clear blood tinged urine output noted. No clots found with foley drainage. PT instructed to monitor and make sure that he keeps foley bag to gravity for good drainage and output. Instructed pt to call his urologist if pain, and increased clots or decreased urine output noted this weekend. Pt verbalized understanding and has no further questions or concerns at this time. Pt appreciative of time in helping change out his foley bag. Tolerated blood transfusion well today with no concerns.

## 2017-08-21 NOTE — ED Notes (Signed)
Pt transported to cancer center with IV intact for his transfusion this morning.

## 2017-08-21 NOTE — ED Triage Notes (Addendum)
Pt had foley catheter placed at 1200 on 10/26 and it was working as normal until 0001 today. Pt states that urine was yellow at 2300 on 10/26. EMS noticed pt to have irregular HR despite no report cardiac history. Pt states he has had dizziness for years with no medical work up done. 12 lead by EMS shows frequent PVCs and PACs. CBG 148 with EMS. HR in the 90s. BP 127/74. sats 96% on RM AIR. EMS gave 117mcgs of fentanyl in route. PIV placed to the left fore arm 18 gauge. Pt is supposed to received 2 units of PRBC in the am with the cancer center for anemia.

## 2017-08-21 NOTE — ED Notes (Signed)
Bladder scan+ 137mL 

## 2017-08-21 NOTE — ED Triage Notes (Signed)
Per pt, states he had foley placed this past Thursday due to not being able to urinate-states he thinks his is passing small clots-was giving something for spasms but it is not working-blood in cath is normal since it was placed

## 2017-08-21 NOTE — Progress Notes (Signed)
Pt arrived from ED to receive 1 unit of blood today. Called blood bank to confirm that his blood is ready here.

## 2017-08-21 NOTE — ED Notes (Signed)
Pt is anxious and restless. Pt aware it will take a little while for the Percocet to relieve some of his pain therefore anxiety will decrease.

## 2017-08-21 NOTE — ED Notes (Signed)
Pt states to the RN that he has had hematuria since catheter placement. Bladder Scan reveals less then 200cc of urine in bladder. Pt with leg bag inplace and no output present at this time.

## 2017-08-21 NOTE — ED Notes (Signed)
Bed: WA17 Expected date:  Expected time:  Means of arrival:  Comments: EMS 83 yo male from home/issues with self cath-urine retention-pain at catheter site/cancer patient-

## 2017-08-22 ENCOUNTER — Inpatient Hospital Stay (HOSPITAL_COMMUNITY)
Admission: EM | Admit: 2017-08-22 | Discharge: 2017-08-27 | DRG: 699 | Disposition: A | Discharge status: Skilled Nursing Facility | Payer: Medicare Other | Attending: Family Medicine | Admitting: Family Medicine

## 2017-08-22 ENCOUNTER — Encounter (HOSPITAL_COMMUNITY): Payer: Self-pay | Admitting: *Deleted

## 2017-08-22 DIAGNOSIS — N390 Urinary tract infection, site not specified: Secondary | ICD-10-CM

## 2017-08-22 DIAGNOSIS — D469 Myelodysplastic syndrome, unspecified: Secondary | ICD-10-CM | POA: Diagnosis not present

## 2017-08-22 DIAGNOSIS — F329 Major depressive disorder, single episode, unspecified: Secondary | ICD-10-CM | POA: Diagnosis present

## 2017-08-22 DIAGNOSIS — T83091A Other mechanical complication of indwelling urethral catheter, initial encounter: Principal | ICD-10-CM

## 2017-08-22 DIAGNOSIS — D61818 Other pancytopenia: Secondary | ICD-10-CM

## 2017-08-22 DIAGNOSIS — Z79899 Other long term (current) drug therapy: Secondary | ICD-10-CM

## 2017-08-22 DIAGNOSIS — N3001 Acute cystitis with hematuria: Secondary | ICD-10-CM | POA: Diagnosis present

## 2017-08-22 DIAGNOSIS — R310 Gross hematuria: Secondary | ICD-10-CM | POA: Diagnosis not present

## 2017-08-22 DIAGNOSIS — D62 Acute posthemorrhagic anemia: Secondary | ICD-10-CM | POA: Diagnosis not present

## 2017-08-22 DIAGNOSIS — B9620 Unspecified Escherichia coli [E. coli] as the cause of diseases classified elsewhere: Secondary | ICD-10-CM | POA: Diagnosis not present

## 2017-08-22 DIAGNOSIS — E890 Postprocedural hypothyroidism: Secondary | ICD-10-CM | POA: Diagnosis present

## 2017-08-22 DIAGNOSIS — F419 Anxiety disorder, unspecified: Secondary | ICD-10-CM | POA: Diagnosis not present

## 2017-08-22 DIAGNOSIS — L111 Transient acantholytic dermatosis [Grover]: Secondary | ICD-10-CM | POA: Diagnosis present

## 2017-08-22 DIAGNOSIS — B009 Herpesviral infection, unspecified: Secondary | ICD-10-CM | POA: Diagnosis present

## 2017-08-22 DIAGNOSIS — Z515 Encounter for palliative care: Secondary | ICD-10-CM | POA: Diagnosis not present

## 2017-08-22 DIAGNOSIS — Z781 Physical restraint status: Secondary | ICD-10-CM | POA: Diagnosis not present

## 2017-08-22 DIAGNOSIS — N35919 Unspecified urethral stricture, male, unspecified site: Secondary | ICD-10-CM | POA: Diagnosis present

## 2017-08-22 DIAGNOSIS — Z885 Allergy status to narcotic agent status: Secondary | ICD-10-CM | POA: Diagnosis not present

## 2017-08-22 DIAGNOSIS — Y738 Miscellaneous gastroenterology and urology devices associated with adverse incidents, not elsewhere classified: Secondary | ICD-10-CM | POA: Diagnosis present

## 2017-08-22 DIAGNOSIS — G4700 Insomnia, unspecified: Secondary | ICD-10-CM | POA: Diagnosis present

## 2017-08-22 DIAGNOSIS — Z8546 Personal history of malignant neoplasm of prostate: Secondary | ICD-10-CM

## 2017-08-22 DIAGNOSIS — R339 Retention of urine, unspecified: Secondary | ICD-10-CM | POA: Diagnosis present

## 2017-08-22 DIAGNOSIS — Z87891 Personal history of nicotine dependence: Secondary | ICD-10-CM

## 2017-08-22 DIAGNOSIS — D649 Anemia, unspecified: Secondary | ICD-10-CM | POA: Diagnosis not present

## 2017-08-22 DIAGNOSIS — D696 Thrombocytopenia, unspecified: Secondary | ICD-10-CM | POA: Diagnosis present

## 2017-08-22 DIAGNOSIS — R410 Disorientation, unspecified: Secondary | ICD-10-CM | POA: Diagnosis present

## 2017-08-22 DIAGNOSIS — G8929 Other chronic pain: Secondary | ICD-10-CM | POA: Diagnosis present

## 2017-08-22 DIAGNOSIS — Z888 Allergy status to other drugs, medicaments and biological substances status: Secondary | ICD-10-CM

## 2017-08-22 DIAGNOSIS — R319 Hematuria, unspecified: Secondary | ICD-10-CM | POA: Diagnosis not present

## 2017-08-22 DIAGNOSIS — Z7189 Other specified counseling: Secondary | ICD-10-CM | POA: Diagnosis not present

## 2017-08-22 DIAGNOSIS — D4622 Refractory anemia with excess of blasts 2: Secondary | ICD-10-CM | POA: Diagnosis not present

## 2017-08-22 DIAGNOSIS — T83091D Other mechanical complication of indwelling urethral catheter, subsequent encounter: Secondary | ICD-10-CM | POA: Diagnosis not present

## 2017-08-22 DIAGNOSIS — N3289 Other specified disorders of bladder: Secondary | ICD-10-CM | POA: Diagnosis present

## 2017-08-22 LAB — CBC WITH DIFFERENTIAL/PLATELET
BASOS ABS: 0 10*3/uL (ref 0.0–0.1)
BASOS PCT: 0 %
EOS ABS: 0 10*3/uL (ref 0.0–0.7)
Eosinophils Relative: 0 %
HEMATOCRIT: 23.2 % — AB (ref 39.0–52.0)
Hemoglobin: 7.8 g/dL — ABNORMAL LOW (ref 13.0–17.0)
Lymphocytes Relative: 37 %
Lymphs Abs: 0.8 10*3/uL (ref 0.7–4.0)
MCH: 28.7 pg (ref 26.0–34.0)
MCHC: 33.6 g/dL (ref 30.0–36.0)
MCV: 85.3 fL (ref 78.0–100.0)
MONO ABS: 0.2 10*3/uL (ref 0.1–1.0)
MONOS PCT: 9 %
Neutro Abs: 1.2 10*3/uL — ABNORMAL LOW (ref 1.7–7.7)
Neutrophils Relative %: 54 %
PLATELETS: 33 10*3/uL — AB (ref 150–400)
RBC: 2.72 MIL/uL — ABNORMAL LOW (ref 4.22–5.81)
RDW: 19.5 % — AB (ref 11.5–15.5)
WBC: 2.3 10*3/uL — AB (ref 4.0–10.5)

## 2017-08-22 LAB — BASIC METABOLIC PANEL
Anion gap: 10 (ref 5–15)
BUN: 17 mg/dL (ref 6–20)
CALCIUM: 8.8 mg/dL — AB (ref 8.9–10.3)
CO2: 24 mmol/L (ref 22–32)
Chloride: 102 mmol/L (ref 101–111)
Creatinine, Ser: 1.05 mg/dL (ref 0.61–1.24)
GFR calc non Af Amer: >60 mL/min (ref >60)
Glucose, Bld: 115 mg/dL — ABNORMAL HIGH (ref 65–99)
Potassium: 3.6 mmol/L (ref 3.5–5.1)
Sodium: 136 mmol/L (ref 135–145)

## 2017-08-22 LAB — URINALYSIS, ROUTINE W REFLEX MICROSCOPIC
Glucose, UA: NEGATIVE mg/dL
Ketones, ur: 15 mg/dL — AB
Nitrite: POSITIVE — AB
Protein, ur: >300 mg/dL — AB
Specific Gravity, Urine: 1.01 (ref 1.005–1.030)
pH: 7 (ref 5.0–8.0)

## 2017-08-22 LAB — MRSA PCR SCREENING: MRSA by PCR: NEGATIVE

## 2017-08-22 LAB — URINALYSIS, MICROSCOPIC (REFLEX)

## 2017-08-22 LAB — PREPARE RBC (CROSSMATCH)

## 2017-08-22 MED ORDER — SODIUM CHLORIDE 0.9 % IV SOLN
Freq: Once | INTRAVENOUS | Status: AC
  Administered 2017-08-22: 23:00:00 via INTRAVENOUS

## 2017-08-22 MED ORDER — LIDOCAINE HCL 2 % EX GEL
1.0000 "application " | Freq: Once | CUTANEOUS | Status: AC
  Administered 2017-08-22: 1 via URETHRAL
  Filled 2017-08-22: qty 11

## 2017-08-22 MED ORDER — FLUCONAZOLE 100 MG PO TABS
200.0000 mg | ORAL_TABLET | Freq: Every day | ORAL | Status: DC
  Administered 2017-08-23 – 2017-08-27 (×5): 200 mg via ORAL
  Filled 2017-08-22: qty 2
  Filled 2017-08-22: qty 1
  Filled 2017-08-22: qty 2
  Filled 2017-08-22 (×2): qty 1
  Filled 2017-08-22 (×3): qty 2
  Filled 2017-08-22: qty 1

## 2017-08-22 MED ORDER — OXYBUTYNIN CHLORIDE 5 MG PO TABS
5.0000 mg | ORAL_TABLET | Freq: Three times a day (TID) | ORAL | Status: DC
  Administered 2017-08-22 – 2017-08-27 (×15): 5 mg via ORAL
  Filled 2017-08-22 (×15): qty 1

## 2017-08-22 MED ORDER — NORTRIPTYLINE HCL 25 MG PO CAPS
25.0000 mg | ORAL_CAPSULE | Freq: Every day | ORAL | Status: DC
  Administered 2017-08-22 – 2017-08-26 (×5): 25 mg via ORAL
  Filled 2017-08-22 (×7): qty 1

## 2017-08-22 MED ORDER — HYDROMORPHONE HCL 1 MG/ML IJ SOLN
INTRAMUSCULAR | Status: AC
  Administered 2017-08-23: 0.5 mg via INTRAVENOUS
  Filled 2017-08-22: qty 1

## 2017-08-22 MED ORDER — ONDANSETRON HCL 4 MG PO TABS: 4.0000 mg | ORAL_TABLET | Freq: Four times a day (QID) | ORAL | Status: DC | PRN

## 2017-08-22 MED ORDER — GABAPENTIN 100 MG PO CAPS
200.0000 mg | ORAL_CAPSULE | Freq: Three times a day (TID) | ORAL | Status: DC
  Administered 2017-08-22 – 2017-08-27 (×15): 200 mg via ORAL
  Filled 2017-08-22 (×15): qty 2

## 2017-08-22 MED ORDER — SODIUM CHLORIDE 0.9 % IV SOLN: Freq: Once | INTRAVENOUS | Status: DC

## 2017-08-22 MED ORDER — OXYCODONE-ACETAMINOPHEN 5-325 MG PO TABS
1.0000 | ORAL_TABLET | Freq: Once | ORAL | Status: AC
  Administered 2017-08-22: 1 via ORAL
  Filled 2017-08-22: qty 1

## 2017-08-22 MED ORDER — LORAZEPAM 1 MG PO TABS
1.0000 mg | ORAL_TABLET | Freq: Every day | ORAL | Status: DC
  Administered 2017-08-22 – 2017-08-26 (×5): 1 mg via ORAL
  Filled 2017-08-22 (×5): qty 1

## 2017-08-22 MED ORDER — ONDANSETRON HCL 4 MG/2ML IJ SOLN: 4.0000 mg | Freq: Four times a day (QID) | INTRAMUSCULAR | Status: DC | PRN

## 2017-08-22 MED ORDER — HYDROMORPHONE HCL 1 MG/ML IJ SOLN
0.5000 mg | INTRAMUSCULAR | Status: DC | PRN
  Administered 2017-08-22 – 2017-08-23 (×2): 0.5 mg via INTRAVENOUS
  Filled 2017-08-22: qty 1

## 2017-08-22 MED ORDER — ACYCLOVIR 400 MG PO TABS
400.0000 mg | ORAL_TABLET | Freq: Every day | ORAL | Status: DC
  Administered 2017-08-22 – 2017-08-27 (×6): 400 mg via ORAL
  Filled 2017-08-22 (×7): qty 1

## 2017-08-22 MED ORDER — TRIAMCINOLONE ACETONIDE 0.1 % EX CREA
1.0000 "application " | TOPICAL_CREAM | Freq: Every morning | CUTANEOUS | Status: DC
  Administered 2017-08-23 – 2017-08-27 (×5): 1 via TOPICAL
  Filled 2017-08-22 (×3): qty 15

## 2017-08-22 NOTE — ED Notes (Signed)
Floor RN unavailable for report at this time.  That RN to call this RN back.

## 2017-08-22 NOTE — ED Triage Notes (Signed)
EMS reports pt is stilling having large amount of blood in urine, pt is anxious

## 2017-08-22 NOTE — ED Notes (Signed)
ED TO INPATIENT HANDOFF REPORT  Name/Age/Gender Jordan Terry 83 y.o. male  Code Status    Code Status Orders        Start     Ordered   08/22/17 1947  Full code  Continuous     08/22/17 1948    Code Status History    Date Active Date Inactive Code Status Order ID Comments User Context   06/22/2017  6:57 AM 06/23/2017  8:24 PM Full Code [id number]  Rise Patience, MD Inpatient   04/26/2017  5:49 PM 05/05/2017  7:54 PM Full Code [id number]  Reubin Milan, MD Inpatient   04/19/2017 10:20 PM 04/22/2017  9:01 PM Full Code [id number]  Rise Patience, MD Inpatient   03/08/2016 12:44 PM 03/12/2016  4:02 PM Full Code [id number]  Kelvin Cellar, MD Inpatient      Home/SNF/Other Home  Chief Complaint  urinary retention - CA pt  Level of Care/Admitting Diagnosis ED Disposition    ED Disposition Condition [place]: [facility] [[id number]]  Level of Care: Stepdown [14]  Admit to SDU based on following criteria: Severe physiological/psychological symptoms:  Any diagnosis requiring assessment & intervention at least every 4 hours on an ongoing basis to obtain desired patient outcomes including stability and rehabilitation  Diagnosis: Hematuria [932671]  Admitting Physician: Elwin Mocha [[id number]]  Attending Physician: Elwin Mocha [[id number]]  Estimated length of stay: 3 - 4 days  Certification:: I certify this patient will need inpatient services for at least 2 midnights  PT Class (Do Not Modify): Inpatient [101]  PT Acc Code (Do Not Modify): Private [1]       Medical History Past Medical History:  Diagnosis Date  . Anginal pain ([place]) 20 years ago  . Anxiety   . Arthritis   . Bilateral tinnitus    wears hearing aides  . Cancer [facility])    prostate  . Grover's disease   . Headache(784.0)    not since retired  . Hematuria 04/14/2017  . Pancytopenia ([place]) 04/19/2017  . Self-catheterizes urinary bladder 04/19/2017  .  Vertigo     Allergies Allergies  Allergen Reactions  . Benadryl [Diphenhydramine Hcl] Anxiety    MAKES HIM GO CRAZY  . Hydrocodone Itching    Small amounts are okay    IV Location/Drains/Wounds Patient Lines/Drains/Airways Status   Active Line/Drains/Airways    Name:   Placement date:   Placement time:   Site:   Days:   Urethral Catheter [place]  03/08/16            532   Urethral Catheter                 Urethral Catheter P Dowd RN Straight-tip 22 Fr.  08/22/17    1210    Straight-tip    less than 1   Incision 08/14/13 Perineum Other (Comment)  08/14/13    1547      1469          Labs/Imaging Results for orders placed or performed during the hospital encounter of 08/22/17 (from the past 48 hour(s))  CBC with Differential     Status: Abnormal   Collection Time: 08/22/17 12:30 PM  Result Value Ref Range   WBC 2.3 (L) 4.0 - 10.5 K/uL   RBC 2.72 (L) 4.22 - 5.81 MIL/uL   Hemoglobin 7.8 (L) 13.0 - 17.0 g/dL   HCT 23.2 (L) 39.0 - 52.0 %   MCV 85.3  78.0 - 100.0 fL   MCH 28.7 26.0 - 34.0 pg   MCHC 33.6 30.0 - 36.0 g/dL   RDW 19.5 (H) 11.5 - 15.5 %   Platelets 33 (L) 150 - 400 K/uL    Comment: CONSISTENT WITH PREVIOUS RESULT   Neutrophils Relative % 54 %   Neutro Abs 1.2 (L) 1.7 - 7.7 K/uL   Lymphocytes Relative 37 %   Lymphs Abs 0.8 0.7 - 4.0 K/uL   Monocytes Relative 9 %   Monocytes Absolute 0.2 0.1 - 1.0 K/uL   Eosinophils Relative 0 %   Eosinophils Absolute 0.0 0.0 - 0.7 K/uL   Basophils Relative 0 %   Basophils Absolute 0.0 0.0 - 0.1 K/uL  Basic metabolic panel     Status: Abnormal   Collection Time: 08/22/17  1:39 PM  Result Value Ref Range   Sodium 136 135 - 145 mmol/L   Potassium 3.6 3.5 - 5.1 mmol/L   Chloride 102 101 - 111 mmol/L   CO2 24 22 - 32 mmol/L   Glucose, Bld 115 (H) 65 - 99 mg/dL   BUN 17 6 - 20 mg/dL   Creatinine, Ser 1.05 0.61 - 1.24 mg/dL   Calcium 8.8 (L) 8.9 - 10.3 mg/dL   GFR calc non Af Amer >60 >60 mL/min   GFR calc Af Amer >60 >60  mL/min    Comment: (NOTE) The eGFR has been calculated using the CKD EPI equation. This calculation has not been validated in all clinical situations. eGFR's persistently <60 mL/min signify possible Chronic Kidney Disease.    Anion gap 10 5 - 15  Urinalysis, Routine w reflex microscopic     Status: Abnormal   Collection Time: 08/22/17  2:34 PM  Result Value Ref Range   Color, Urine RED (A) YELLOW    Comment: BIOCHEMICALS MAY BE AFFECTED BY COLOR   APPearance HAZY (A) CLEAR   Specific Gravity, Urine 1.010 1.005 - 1.030   pH 7.0 5.0 - 8.0   Glucose, UA NEGATIVE NEGATIVE mg/dL   Hgb urine dipstick LARGE (A) NEGATIVE   Bilirubin Urine MODERATE (A) NEGATIVE   Ketones, ur 15 (A) NEGATIVE mg/dL   Protein, ur >300 (A) NEGATIVE mg/dL   Nitrite POSITIVE (A) NEGATIVE   Leukocytes, UA LARGE (A) NEGATIVE  Urinalysis, Microscopic (reflex)     Status: Abnormal   Collection Time: 08/22/17  2:34 PM  Result Value Ref Range   RBC / HPF TOO NUMEROUS TO COUNT 0 - 5 RBC/hpf   WBC, UA TOO NUMEROUS TO COUNT 0 - 5 WBC/hpf   Bacteria, UA FEW (A) NONE SEEN   Squamous Epithelial / LPF 0-5 (A) NONE SEEN   Mucus PRESENT   Type and screen [place]     Status: None (Preliminary result)   Collection Time: 08/22/17  3:00 PM  Result Value Ref Range   ABO/RH(D) A POS    Antibody Screen NEG    Sample Expiration 08/25/2017    Unit Number [id number]    Blood Component Type RBC, LR IRR    Unit division 00    Status of Unit ALLOCATED    Transfusion Status OK TO TRANSFUSE    Crossmatch Result Compatible    Unit Number [id number]    Blood Component Type RBC, LR IRR    Unit division 00    Status of Unit ISSUED    Transfusion Status OK TO TRANSFUSE    Crossmatch Result Compatible   Prepare RBC  Status: None   Collection Time: 08/22/17  3:00 PM  Result Value Ref Range   Order Confirmation ORDER PROCESSED BY BLOOD BANK    Dg Abd Acute W/chest  Result Date: 08/21/2017 CLINICAL  DATA:  Abdominal pain. EXAM: DG ABDOMEN ACUTE W/ 1V CHEST COMPARISON:  June 22, 2017. FINDINGS: There is no evidence of dilated bowel loops or free intraperitoneal air. No radiopaque calculi or other significant radiographic abnormality is seen. Unchanged prostatic seed implants. Degenerative changes of the lumbar spine. Heart size and mediastinal contours are within normal limits. Both lungs are clear. IMPRESSION: Negative abdominal radiographs.  No acute cardiopulmonary disease. Electronically Signed   By: Titus Dubin M.D.   On: 08/21/2017 18:03    Pending Labs Unresulted Labs    Start     Ordered   08/23/17 2376  Basic metabolic panel  Tomorrow morning,   R     08/22/17 1948   08/23/17 0500  CBC  Tomorrow morning,   R     08/22/17 1948   08/23/17 0500  APTT  Tomorrow morning,   R     08/22/17 1948   08/23/17 0500  Protime-INR  Tomorrow morning,   R     08/22/17 1948   08/22/17 1952  Prepare Pheresed Platelets  (Adult Blood Administration - Platelets (Pheresed))  Once,   R    Question Answer Comment  Number of Apheresis Units 1 unit (6-10 packs)   Transfusion Indications Actively Bleeding   Special Requirements Irradiated   Special Requirements CMV/Leukoreduced   If emergent release call blood bank Not emergent release      08/22/17 1953   08/22/17 1442  Urine culture  STAT,   STAT     08/22/17 1441      Vitals/Pain Today's Vitals   08/22/17 1648 08/22/17 1820 08/22/17 1842 08/22/17 1947  BP: 94/61  116/65 (!) 115/55  Pulse: 97  93 94  Resp: '18  18 18  ' Temp:    99.4 F (37.4 C)  TempSrc:    Oral  SpO2: 98%  99%   PainSc:  10-Worst pain ever      Isolation Precautions No active isolations  Medications Medications  0.9 %  sodium chloride infusion (not administered)  HYDROmorphone (DILAUDID) injection 0.5 mg (0.5 mg Intravenous Given 08/22/17 1840)  oxybutynin (DITROPAN) tablet 5 mg (not administered)  acyclovir (ZOVIRAX) tablet 400 mg (not administered)   fluconazole (DIFLUCAN) tablet 200 mg (not administered)  nortriptyline (PAMELOR) capsule 25 mg (not administered)  gabapentin (NEURONTIN) capsule 200 mg (not administered)  LORazepam (ATIVAN) tablet 1 mg (not administered)  triamcinolone cream (KENALOG) 0.1 % 1 application (not administered)  ondansetron (ZOFRAN) tablet 4 mg (not administered)    Or  ondansetron (ZOFRAN) injection 4 mg (not administered)  0.9 %  sodium chloride infusion (not administered)  oxyCODONE-acetaminophen (PERCOCET/ROXICET) 5-325 MG per tablet 1 tablet (1 tablet Oral Given 08/22/17 1211)  lidocaine (XYLOCAINE) 2 % jelly 1 application (1 application Urethral Given 08/22/17 1211)    Mobility Ambulatory with assistance

## 2017-08-22 NOTE — ED Notes (Signed)
Bed: RESA Expected date:  Expected time:  Means of arrival:  Comments: Urinary retention

## 2017-08-22 NOTE — ED Notes (Signed)
Attempted to call ICU to give report. All lines called were busy. Agricultural consultant notified

## 2017-08-22 NOTE — H&P (Signed)
[facility] History and Physical  SABASTIAN RAIMONDI IWP:[id number] DOB: 17-Sep-1932 DOA: 08/22/2017  Referring physician:  PCP: [name], MD   Chief Complaint: hematuria  HPI: HOLDYN POYSER is a 83 y.o. male  with past medical history of prostate cancer, Grovers disease, MDS and anxiety presents with hematuria.  Patient has recently been seen by multiple providers for this issue.  He has received multiple units of blood and platelets.  He continues to bleed and has been passing large clots of blood.  ED Course: Urology called by EDP.  CBI started.  Hospitalist consulted for admission.   Review of Systems:  As per HPI otherwise 10 point review of systems negative.    Past Medical History:  Diagnosis Date  . Anginal pain ([place]) 20 years ago  . Anxiety   . Arthritis   . Bilateral tinnitus    wears hearing aides  . Cancer [facility])    prostate  . Grover's disease   . Headache(784.0)    not since retired  . Hematuria 04/14/2017  . Pancytopenia ([place]) 04/19/2017  . Self-catheterizes urinary bladder 04/19/2017  . Vertigo    Past Surgical History:  Procedure Laterality Date  . CARDIAC CATHETERIZATION  "20 years ago"  . CARPAL TUNNEL RELEASE Left 2008  . CATARACT EXTRACTION Bilateral 2014  . CYSTOSCOPY WITH URETHRAL DILATATION N/A 08/14/2013   Procedure: CYSTOSCOPY WITH URETHRAL DILATATION BALLOON DILATION OF URETHRAL STRICTURE;  Surgeon: Dutch Gray, MD;  Location: [facility];  Service: Urology;  Laterality: N/A;  BALLOON DILATION   . CYSTOSCOPY WITH URETHRAL DILATATION N/A 05/07/2014   Procedure: CYSTOSCOPY WITH BALLOON DILATION OF URETHRAL STRICTURE;  Surgeon: Raynelle Bring, MD;  Location: [facility];  Service: Urology;  Laterality: N/A;  [place]   "scope"  . NOSE SURGERY  1991, 1998  . RADIOACTIVE SEED IMPLANT  2002   "for prostate cancer"  . SHOULDER SURGERY  2012   right  . THYROIDECTOMY, PARTIAL  1974  .  VASECTOMY  1970   Social History:  reports that he quit smoking about 39 years ago. His smoking use included Cigars. He quit after 10.00 years of use. He has never used smokeless tobacco. He reports that he does not drink alcohol or use drugs.  Allergies  Allergen Reactions  . Benadryl [Diphenhydramine Hcl] Anxiety    MAKES HIM GO CRAZY  . Hydrocodone Itching    Small amounts are okay    Family History  Problem Relation Age of Onset  . Anxiety disorder Mother   . Cancer Father        colon, lung  . Diabetes Brother   . Cancer Brother   . Diabetes Sister   . Leukemia Sister   . Cancer Sister        breast  . Cancer Sister        breast  . Cancer Sister        thyroid  . Diabetes Sister      Prior to Admission medications   Medication Sig Start Date End Date Taking? Authorizing Provider  acyclovir (ZOVIRAX) 400 MG tablet Take 1 tablet (400 mg total) by mouth daily. 08/10/17  Yes Brunetta Genera, MD  fluconazole (DIFLUCAN) 200 MG tablet Take 1 tablet (200 mg total) by mouth daily. 08/10/17  Yes Brunetta Genera, MD  gabapentin (NEURONTIN) 100 MG capsule Take 200 mg by mouth 3 (three) times daily.  Yes [provider]  LORazepam (ATIVAN) 0.5 MG tablet Take 1 mg by mouth at bedtime.   Yes [provider]  nortriptyline (PAMELOR) 25 MG capsule Take 1 capsule (25 mg total) by mouth at bedtime. 01/19/17  Yes Dennie Bible, NP  ondansetron (ZOFRAN) 8 MG tablet Take 1 tablet (8 mg total) by mouth 2 (two) times daily as needed (Nausea or vomiting). 05/17/17  Yes Brunetta Genera, MD  oxybutynin (DITROPAN) 5 MG tablet Take 5 mg by mouth 3 (three) times daily.  08/21/17  Yes [provider]  oxyCODONE-acetaminophen (PERCOCET) 7.5-325 MG per tablet Take 0.5-1 tablets by mouth every 8 (eight) hours as needed (pain.).    Yes [provider]  polyethylene glycol (MIRALAX / GLYCOLAX) packet Take 17 g by mouth 2 (two) times daily. 07/14/17   Yes Brunetta Genera, MD  prochlorperazine (COMPAZINE) 10 MG tablet Take 1 tablet (10 mg total) by mouth every 6 (six) hours as needed (Nausea or vomiting). 05/17/17  Yes Brunetta Genera, MD  senna-docusate (SENOKOT-S) 8.6-50 MG tablet Take 2 tablets by mouth 2 (two) times daily. 06/23/17  Yes Dessa Phi Chahn-Yang, DO  temazepam (RESTORIL) 15 MG capsule Take 15 mg by mouth at bedtime.   Yes [provider]  triamcinolone cream (KENALOG) 0.1 % Apply 1 application topically every morning. APPLY TO CHEST AND BACK EVERY MORNING FOR [place] DISEASE   Yes [provider]   Physical Exam: Vitals:   08/22/17 1155 08/22/17 1356 08/22/17 1648 08/22/17 1842  BP: 115/65 (!) 103/58 94/61 116/65  Pulse: 93 86 97 93  Resp: 16 20 18 18   Temp: 99.4 F (37.4 C)     TempSrc: Oral     SpO2: 100% 97% 98% 99%    Wt Readings from Last 3 Encounters:  08/21/17 72.6 kg (160 lb)  08/09/17 73 kg (161 lb)  07/12/17 73.2 kg (161 lb 4.8 oz)    General:  Appears calm and comfortable; A&Ox3, hard of hearing Eyes:  PERRL, EOMI, normal lids, iris ENT:  grossly normal hearing, lips & tongue Neck:  no LAD, masses or thyromegaly Cardiovascular:  RRR, no m/r/g. No LE edema.  Respiratory:  CTA bilaterally, no w/r/r. Normal respiratory effort. Abdomen:  soft, ntnd Skin:  no rash or induration seen on limited exam Musculoskeletal:  grossly normal tone BUE/BLE Psychiatric:  grossly normal mood and affect, speech fluent and appropriate Neurologic:  CN 2-12 grossly intact, moves all extremities in coordinated fashion.          Labs on Admission:  Basic Metabolic Panel:  Recent Labs Lab 08/21/17 0309 08/22/17 1339  NA 142 136  K 4.1 3.6  CL 105 102  CO2 29 24  GLUCOSE 106* 115*  BUN 19 17  CREATININE 1.01 1.05  CALCIUM 9.3 8.8*   Liver Function Tests: No results for input(s): AST, ALT, ALKPHOS, BILITOT, PROT, ALBUMIN in the last 168 hours. No results for input(s): LIPASE,  AMYLASE in the last 168 hours. No results for input(s): AMMONIA in the last 168 hours. CBC:  Recent Labs Lab 08/20/17 1159 08/21/17 0309 08/21/17 1825 08/22/17 1230  WBC 1.5* 1.9* 1.9* 2.3*  NEUTROABS 0.4* 0.5* 1.0* 1.2*  HGB 7.0* 7.6* 8.9* 7.8*  HCT 21.9* 22.4* 26.0* 23.2*  MCV 90.1 87.8 86.7 85.3  PLT 26* 66* 47* 33*   Cardiac Enzymes: No results for input(s): CKTOTAL, CKMB, CKMBINDEX, TROPONINI in the last 168 hours.  BNP (last 3 results) No results for input(s):  BNP in the last 8760 hours.  ProBNP (last 3 results) No results for input(s): PROBNP in the last 8760 hours.   Serum creatinine: 1.05 mg/dL 08/22/17 1339 Estimated creatinine clearance: 55.7 mL/min  CBG: No results for input(s): GLUCAP in the last 168 hours.  Radiological Exams on Admission: Dg Abd Acute W/chest  Result Date: 08/21/2017 CLINICAL DATA:  Abdominal pain. EXAM: DG ABDOMEN ACUTE W/ 1V CHEST COMPARISON:  June 22, 2017. FINDINGS: There is no evidence of dilated bowel loops or free intraperitoneal air. No radiopaque calculi or other significant radiographic abnormality is seen. Unchanged prostatic seed implants. Degenerative changes of the lumbar spine. Heart size and mediastinal contours are within normal limits. Both lungs are clear. IMPRESSION: Negative abdominal radiographs.  No acute cardiopulmonary disease. Electronically Signed   By: Titus Dubin M.D.   On: 08/21/2017 18:03    EKG: Independently reviewed. NSR with PVCS. No sTEMI.  Assessment/Plan Active Problems:   Hematuria   Hematuria/Urinary Retention Mgmt per Urology CBI Cont oxybutin  Herpes Cont zovirx  Cont diflucan  MDS Daily CBC  Low plts Irradiate Plts  Depression Cont pamelor  Anemia 2u PRBCs ordered by EDP  Chronic Pain Cont gabapentin  Insomnia Ativan qhs Holding restoril  Grover's Dz Kenalog qam to back and chest  Code Status: FC  DVT Prophylaxis: SCDs Family Communication: dgtr at  bedside Disposition Plan: Pending Improvement  Status: inpt sdu  Elwin Mocha, MD Family Medicine [facility] [email or web address] Password [email or web address]

## 2017-08-22 NOTE — ED Notes (Signed)
Floor RN requesting bedside report.  Will take patient up in 10 minutes.

## 2017-08-22 NOTE — ED Notes (Signed)
Pt states he can not void via cath. Tried to irrigate foley, would freely go in but would not drain out. Spoke with Dr Gilford Raid whom ordered a 3-way cath with irrigation. Pt medicated with Percocet po and Lidocaine inserted into meatus prior to placing cath. Upon cath removal in preparation for 3-way, clots were noted to have the flow area clogged. Able to put 22 F 3-way in with only slight resistance. Pt tol well. Irrigation started clots passed and now pink in color without visible clots. Dr Gilford Raid updated.

## 2017-08-22 NOTE — ED Provider Notes (Signed)
[facility] DEPT Provider Note   CSN: [id number] Arrival date & time: 08/22/17  1146     History   Chief Complaint Chief Complaint  Patient presents with  . Urinary Retention    HPI Jordan Terry is a 83 y.o. male.  Pt presents to the ED today with urinary retention.  The pt has an indwelling foley that was placed initially by Urology due to urinary retention.  He also has a hx of myelodysplastic syndrome and has had problems with hematuria.  The pt received a platelet transfusion on the 26th and a blood transfusion yesterday.  The pt was in the ED twice yesterday for bladder spasms.  He comes back today b/c blood is leaking around catheter.  Family did call the urologist on call.  Pt's wife also said that pt has been more confused over night.  She said he tried to get out of bed in the night and there was blood all over the house.  She said he is not acting his normal self.  She does not feel like she can take care of him at home any more.  She said chemo has not helped pt at all.  She is asking about palliative care.      Past Medical History:  Diagnosis Date  . Anginal pain ([place]) 20 years ago  . Anxiety   . Arthritis   . Bilateral tinnitus    wears hearing aides  . Cancer [facility])    prostate  . Grover's disease   . Headache(784.0)    not since retired  . Hematuria 04/14/2017  . Pancytopenia ([place]) 04/19/2017  . Self-catheterizes urinary bladder 04/19/2017  . Vertigo     Patient Active Problem List   Diagnosis Date Noted  . Counseling regarding advanced care planning and goals of care 08/05/2017  . Anemia 06/22/2017  . Constipation 06/22/2017  . Myelodysplastic syndrome ([place]) 05/07/2017  . Gross hematuria 04/26/2017  . 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 Dyspnea 04/26/2017  . NHL (non-Hodgkin's lymphoma) ([place])   . Urethral bleeding 04/19/2017  . Sepsis ([place]) 03/09/2016  . Prostate cancer ([place]) 03/08/2016  . Hematuria 03/08/2016  .  Fever and chills 03/08/2016  . Fever 03/08/2016  . Abnormality of gait 05/31/2013  . Dizziness and giddiness 05/31/2013    Past Surgical History:  Procedure Laterality Date  . CARDIAC CATHETERIZATION  "20 years ago"  . CARPAL TUNNEL RELEASE Left 2008  . CATARACT EXTRACTION Bilateral 2014  . CYSTOSCOPY WITH URETHRAL DILATATION N/A 08/14/2013   Procedure: CYSTOSCOPY WITH URETHRAL DILATATION BALLOON DILATION OF URETHRAL STRICTURE;  Surgeon: Dutch Gray, MD;  Location: [facility];  Service: Urology;  Laterality: N/A;  BALLOON DILATION   . CYSTOSCOPY WITH URETHRAL DILATATION N/A 05/07/2014   Procedure: CYSTOSCOPY WITH BALLOON DILATION OF URETHRAL STRICTURE;  Surgeon: Raynelle Bring, MD;  Location: [facility];  Service: Urology;  Laterality: N/A;  [place]   "scope"  . NOSE SURGERY  1991, 1998  . RADIOACTIVE SEED IMPLANT  2002   "for prostate cancer"  . SHOULDER SURGERY  2012   right  . THYROIDECTOMY, PARTIAL  1974  . VASECTOMY  1970       Home Medications    Prior to Admission medications   Medication Sig Start Date End Date Taking? Authorizing Provider  acyclovir (ZOVIRAX) 400 MG tablet Take 1 tablet (400 mg total) by mouth daily. 08/10/17  Yes Caroline,  Cloria Spring, MD  fluconazole (DIFLUCAN) 200 MG tablet Take 1 tablet (200 mg total) by mouth daily. 08/10/17  Yes Brunetta Genera, MD  gabapentin (NEURONTIN) 100 MG capsule Take 200 mg by mouth 3 (three) times daily.    Yes [provider]  LORazepam (ATIVAN) 0.5 MG tablet Take 1 mg by mouth at bedtime.   Yes [provider]  nortriptyline (PAMELOR) 25 MG capsule Take 1 capsule (25 mg total) by mouth at bedtime. 01/19/17  Yes Dennie Bible, NP  ondansetron (ZOFRAN) 8 MG tablet Take 1 tablet (8 mg total) by mouth 2 (two) times daily as needed (Nausea or vomiting). 05/17/17  Yes Brunetta Genera, MD  oxybutynin (DITROPAN) 5 MG tablet Take 5 mg by mouth 3  (three) times daily.  08/21/17  Yes [provider]  oxyCODONE-acetaminophen (PERCOCET) 7.5-325 MG per tablet Take 0.5-1 tablets by mouth every 8 (eight) hours as needed (pain.).    Yes [provider]  polyethylene glycol (MIRALAX / GLYCOLAX) packet Take 17 g by mouth 2 (two) times daily. 07/14/17  Yes Brunetta Genera, MD  prochlorperazine (COMPAZINE) 10 MG tablet Take 1 tablet (10 mg total) by mouth every 6 (six) hours as needed (Nausea or vomiting). 05/17/17  Yes Brunetta Genera, MD  senna-docusate (SENOKOT-S) 8.6-50 MG tablet Take 2 tablets by mouth 2 (two) times daily. 06/23/17  Yes Dessa Phi Chahn-Yang, DO  temazepam (RESTORIL) 15 MG capsule Take 15 mg by mouth at bedtime.   Yes [provider]  triamcinolone cream (KENALOG) 0.1 % Apply 1 application topically every morning. APPLY TO CHEST AND BACK EVERY MORNING FOR [place] DISEASE   Yes [provider]    Family History Family History  Problem Relation Age of Onset  . Anxiety disorder Mother   . Cancer Father        colon, lung  . Diabetes Brother   . Cancer Brother   . Diabetes Sister   . Leukemia Sister   . Cancer Sister        breast  . Cancer Sister        breast  . Cancer Sister        thyroid  . Diabetes Sister     Social History Social History  Substance Use Topics  . Smoking status: Former Smoker    Years: 10.00    Types: Cigars    Quit date: 1979  . Smokeless tobacco: Never Used  . Alcohol use No     Allergies   Benadryl [diphenhydramine hcl] and Hydrocodone   Review of Systems Review of Systems  Genitourinary: Positive for difficulty urinating and hematuria.     Physical Exam Updated Vital Signs BP (!) 103/58 (BP Location: Right Arm)   Pulse 86   Temp 99.4 F (37.4 C) (Oral)   Resp 20   SpO2 97%   Physical Exam  Constitutional: He is oriented to person, place, and time. He appears well-developed and well-nourished.  HENT:  Head: Normocephalic  and atraumatic.  Right Ear: External ear normal.  Left Ear: External ear normal.  Nose: Nose normal.  Mouth/Throat: Oropharynx is clear and moist.  Eyes: Pupils are equal, round, and reactive to light. Conjunctivae and EOM are normal.  Neck: Normal range of motion. Neck supple.  Cardiovascular: Normal rate, regular rhythm, normal heart sounds and intact distal pulses.   Pulmonary/Chest: Effort normal and breath sounds normal.  Abdominal: Soft. Bowel sounds are normal.  Genitourinary:  Genitourinary Comments: Blood at  meatus. Urine not draining well.  Urine is grossly bloody.  Musculoskeletal: Normal range of motion.  Neurological: He is alert and oriented to person, place, and time.  Skin: Skin is warm.  Psychiatric: His mood appears anxious.  Nursing note and vitals reviewed.    ED Treatments / Results  Labs (all labs ordered are listed, but only abnormal results are displayed) Labs Reviewed  CBC WITH DIFFERENTIAL/PLATELET - Abnormal; Notable for the following:       Result Value   WBC 2.3 (*)    RBC 2.72 (*)    Hemoglobin 7.8 (*)    HCT 23.2 (*)    RDW 19.5 (*)    Platelets 33 (*)    Neutro Abs 1.2 (*)    All other components within normal limits  BASIC METABOLIC PANEL - Abnormal; Notable for the following:    Glucose, Bld 115 (*)    Calcium 8.8 (*)    All other components within normal limits  URINE CULTURE  URINALYSIS, ROUTINE W REFLEX MICROSCOPIC  TYPE AND SCREEN  PREPARE RBC (CROSSMATCH)    EKG  EKG Interpretation None       Radiology Dg Abd Acute W/chest  Result Date: 08/21/2017 CLINICAL DATA:  Abdominal pain. EXAM: DG ABDOMEN ACUTE W/ 1V CHEST COMPARISON:  June 22, 2017. FINDINGS: There is no evidence of dilated bowel loops or free intraperitoneal air. No radiopaque calculi or other significant radiographic abnormality is seen. Unchanged prostatic seed implants. Degenerative changes of the lumbar spine. Heart size and mediastinal contours are within  normal limits. Both lungs are clear. IMPRESSION: Negative abdominal radiographs.  No acute cardiopulmonary disease. Electronically Signed   By: Titus Dubin M.D.   On: 08/21/2017 18:03    Procedures Procedures (including critical care time)  Medications Ordered in ED Medications  0.9 %  sodium chloride infusion (not administered)  oxyCODONE-acetaminophen (PERCOCET/ROXICET) 5-325 MG per tablet 1 tablet (1 tablet Oral Given 08/22/17 1211)  lidocaine (XYLOCAINE) 2 % jelly 1 application (1 application Urethral Given 08/22/17 1211)     Initial Impression / Assessment and Plan / ED Course  I have reviewed the triage vital signs and the nursing notes.  Pertinent labs & imaging results that were available during my care of the patient were reviewed by me and considered in my medical decision making (see chart for details).  Nurses removed current foley and were able to place a 22 french 3 way catheter.   Lots of blood clots in catheter.   CBI set up for irrigation.  Urine cleared to light pink after 1 bag of CBI, but it has returned to grossly bloody after stopping.  The pt d/w Dr. Lovena Neighbours (urology) who will see him.  I did order 2 units of prbcs to be given.  Pt will also likely need platelets also.  Pt d/w Dr. Aggie Moats ([facility]) who will admit.    Final Clinical Impressions(s) / ED Diagnoses   Final diagnoses:  Obstruction of Foley catheter, initial encounter ([place])  Gross hematuria  Pancytopenia ([place])  Myelodysplastic syndrome ([place])  Symptomatic anemia  Anxiety    New Prescriptions New Prescriptions   No medications on file     Isla Pence, MD 08/22/17 1453

## 2017-08-22 NOTE — Consult Note (Signed)
Urology Consult   Physician requesting consult: Isla Pence, MD  Reason for consult: Hematuria  History of Present Illness: Jordan Terry is a 83 y.o. with a history of prostate cancer treated with brachytherapy in 2002 with subsequent development of a urethral stricture that requires daily intermittent catheterization and myelodysplastic syndrome treated with Vidaza with resultant pancytopenia.  He was recently evaluated in the office for urinary retention/acute cystitis and required a cystoscopy in the office for dilation of his urethral stricture after catheter was unable to be placed.  He had a 41 French Foley placed at that time and was sent home on empiric cipro to treat his suspected UTI.  Over the weekend, the patient developed intense bladder spasms which prompted several visits to the emergency department.  I personally spoke to the patient and his wife several times on the phone and they reported that his catheter was draining without any issues, but that he was having uncontrollable bladder spasms.  I called in PRN oxybutynin 5 mg and instructed the patient to continue PO cipro.  He also received a pRBC and platelet transfusion on Saturday.  Today, the patient reports new onset of gross hematuria with minimal drainage via his Foley and persistent bladder spasms after the he apparently tripped over his catheter bag at home.  He was evaluated in the ED this afternoon and had a 22 French Foley catheter placed by the ED staff and was started on CBI and IV Rocephin.  Currently, his is catheter is draining blood tinged urine with no clots and the CBI is off.  His bladder spasms and suprapubic pain has ablated and he is resting comfortably in the ED stretcher.  There is some confusion as to whether or not the patient started his PO cipro amongst the family members at bedside and the patient.    Past Medical History:  Diagnosis Date  . Anginal pain ([place]) 20 years ago  . Anxiety   . Arthritis    . Bilateral tinnitus    wears hearing aides  . Cancer [facility])    prostate  . Grover's disease   . Headache(784.0)    not since retired  . Hematuria 04/14/2017  . Pancytopenia ([place]) 04/19/2017  . Self-catheterizes urinary bladder 04/19/2017  . Vertigo     Past Surgical History:  Procedure Laterality Date  . CARDIAC CATHETERIZATION  "20 years ago"  . CARPAL TUNNEL RELEASE Left 2008  . CATARACT EXTRACTION Bilateral 2014  . CYSTOSCOPY WITH URETHRAL DILATATION N/A 08/14/2013   Procedure: CYSTOSCOPY WITH URETHRAL DILATATION BALLOON DILATION OF URETHRAL STRICTURE;  Surgeon: Dutch Gray, MD;  Location: [facility];  Service: Urology;  Laterality: N/A;  BALLOON DILATION   . CYSTOSCOPY WITH URETHRAL DILATATION N/A 05/07/2014   Procedure: CYSTOSCOPY WITH BALLOON DILATION OF URETHRAL STRICTURE;  Surgeon: Raynelle Bring, MD;  Location: [facility];  Service: Urology;  Laterality: N/A;  [place]   "scope"  . NOSE SURGERY  1991, 1998  . RADIOACTIVE SEED IMPLANT  2002   "for prostate cancer"  . SHOULDER SURGERY  2012   right  . THYROIDECTOMY, PARTIAL  1974  . VASECTOMY  1970    Current Hospital Medications:  Home Meds:  Current Meds  Medication Sig  . acyclovir (ZOVIRAX) 400 MG tablet Take 1 tablet (400 mg total) by mouth daily.  . fluconazole (DIFLUCAN) 200 MG tablet Take 1 tablet (200 mg total) by mouth daily.  Marland Kitchen  gabapentin (NEURONTIN) 100 MG capsule Take 200 mg by mouth 3 (three) times daily.   Marland Kitchen LORazepam (ATIVAN) 0.5 MG tablet Take 1 mg by mouth at bedtime.  . nortriptyline (PAMELOR) 25 MG capsule Take 1 capsule (25 mg total) by mouth at bedtime.  . ondansetron (ZOFRAN) 8 MG tablet Take 1 tablet (8 mg total) by mouth 2 (two) times daily as needed (Nausea or vomiting).  Marland Kitchen oxybutynin (DITROPAN) 5 MG tablet Take 5 mg by mouth 3 (three) times daily.   Marland Kitchen oxyCODONE-acetaminophen (PERCOCET) 7.5-325 MG per tablet Take 0.5-1 tablets by mouth  every 8 (eight) hours as needed (pain.).   Marland Kitchen polyethylene glycol (MIRALAX / GLYCOLAX) packet Take 17 g by mouth 2 (two) times daily.  . prochlorperazine (COMPAZINE) 10 MG tablet Take 1 tablet (10 mg total) by mouth every 6 (six) hours as needed (Nausea or vomiting).  Marland Kitchen senna-docusate (SENOKOT-S) 8.6-50 MG tablet Take 2 tablets by mouth 2 (two) times daily.  . temazepam (RESTORIL) 15 MG capsule Take 15 mg by mouth at bedtime.  . triamcinolone cream (KENALOG) 0.1 % Apply 1 application topically every morning. APPLY TO CHEST AND BACK EVERY MORNING FOR GROVER DISEASE    Scheduled Meds: Continuous Infusions: . sodium chloride     PRN Meds:.  Allergies:  Allergies  Allergen Reactions  . Benadryl [Diphenhydramine Hcl] Anxiety    MAKES HIM GO CRAZY  . Hydrocodone Itching    Small amounts are okay    Family History  Problem Relation Age of Onset  . Anxiety disorder Mother   . Cancer Father        colon, lung  . Diabetes Brother   . Cancer Brother   . Diabetes Sister   . Leukemia Sister   . Cancer Sister        breast  . Cancer Sister        breast  . Cancer Sister        thyroid  . Diabetes Sister     Social History:  reports that he quit smoking about 39 years ago. His smoking use included Cigars. He quit after 10.00 years of use. He has never used smokeless tobacco. He reports that he does not drink alcohol or use drugs.  ROS: A complete review of systems was performed.  All systems are negative except for pertinent findings as noted.  Physical Exam:  Vital signs in last 24 hours: Temp:  [99.4 F (37.4 C)] 99.4 F (37.4 C) (10/28 1155) Pulse Rate:  [86-97] 97 (10/28 1648) Resp:  [16-20] 18 (10/28 1648) BP: (94-140)/(58-77) 94/61 (10/28 1648) SpO2:  [97 %-100 %] 98 % (10/28 1648) Constitutional:  Alert and oriented, No acute distress Cardiovascular: Regular rate and rhythm, No JVD Respiratory: Normal respiratory effort, Lungs clear bilaterally GI: Abdomen is soft,  nontender, nondistended, no abdominal masses GU: 22 French three way Foley in place and draining blood tinged urine with no clots. Lymphatic: No lymphadenopathy Neurologic: Grossly intact, no focal deficits Psychiatric: Normal mood and affect  Laboratory Data:   Recent Labs  08/20/17 1159 08/21/17 0309 08/21/17 1825 08/22/17 1230  WBC 1.5* 1.9* 1.9* 2.3*  HGB 7.0* 7.6* 8.9* 7.8*  HCT 21.9* 22.4* 26.0* 23.2*  PLT 26* 66* 47* 33*     Recent Labs  08/21/17 0309 08/22/17 1339  NA 142 136  K 4.1 3.6  CL 105 102  GLUCOSE 106* 115*  BUN 19 17  CALCIUM 9.3 8.8*  CREATININE 1.01 1.05     Results  for orders placed or performed during the hospital encounter of 08/22/17 (from the past 24 hour(s))  CBC with Differential     Status: Abnormal   Collection Time: 08/22/17 12:30 PM  Result Value Ref Range   WBC 2.3 (L) 4.0 - 10.5 K/uL   RBC 2.72 (L) 4.22 - 5.81 MIL/uL   Hemoglobin 7.8 (L) 13.0 - 17.0 g/dL   HCT 23.2 (L) 39.0 - 52.0 %   MCV 85.3 78.0 - 100.0 fL   MCH 28.7 26.0 - 34.0 pg   MCHC 33.6 30.0 - 36.0 g/dL   RDW 19.5 (H) 11.5 - 15.5 %   Platelets 33 (L) 150 - 400 K/uL   Neutrophils Relative % 54 %   Neutro Abs 1.2 (L) 1.7 - 7.7 K/uL   Lymphocytes Relative 37 %   Lymphs Abs 0.8 0.7 - 4.0 K/uL   Monocytes Relative 9 %   Monocytes Absolute 0.2 0.1 - 1.0 K/uL   Eosinophils Relative 0 %   Eosinophils Absolute 0.0 0.0 - 0.7 K/uL   Basophils Relative 0 %   Basophils Absolute 0.0 0.0 - 0.1 K/uL  Basic metabolic panel     Status: Abnormal   Collection Time: 08/22/17  1:39 PM  Result Value Ref Range   Sodium 136 135 - 145 mmol/L   Potassium 3.6 3.5 - 5.1 mmol/L   Chloride 102 101 - 111 mmol/L   CO2 24 22 - 32 mmol/L   Glucose, Bld 115 (H) 65 - 99 mg/dL   BUN 17 6 - 20 mg/dL   Creatinine, Ser 1.05 0.61 - 1.24 mg/dL   Calcium 8.8 (L) 8.9 - 10.3 mg/dL   GFR calc non Af Amer >60 >60 mL/min   GFR calc Af Amer >60 >60 mL/min   Anion gap 10 5 - 15  Urinalysis, Routine w  reflex microscopic     Status: Abnormal   Collection Time: 08/22/17  2:34 PM  Result Value Ref Range   Color, Urine RED (A) YELLOW   APPearance HAZY (A) CLEAR   Specific Gravity, Urine 1.010 1.005 - 1.030   pH 7.0 5.0 - 8.0   Glucose, UA NEGATIVE NEGATIVE mg/dL   Hgb urine dipstick LARGE (A) NEGATIVE   Bilirubin Urine MODERATE (A) NEGATIVE   Ketones, ur 15 (A) NEGATIVE mg/dL   Protein, ur >300 (A) NEGATIVE mg/dL   Nitrite POSITIVE (A) NEGATIVE   Leukocytes, UA LARGE (A) NEGATIVE  Urinalysis, Microscopic (reflex)     Status: Abnormal   Collection Time: 08/22/17  2:34 PM  Result Value Ref Range   RBC / HPF TOO NUMEROUS TO COUNT 0 - 5 RBC/hpf   WBC, UA TOO NUMEROUS TO COUNT 0 - 5 WBC/hpf   Bacteria, UA FEW (A) NONE SEEN   Squamous Epithelial / LPF 0-5 (A) NONE SEEN   Mucus PRESENT   Type and screen [place]     Status: None (Preliminary result)   Collection Time: 08/22/17  3:00 PM  Result Value Ref Range   ABO/RH(D) A POS    Antibody Screen NEG    Sample Expiration 08/25/2017    Unit Number [id number]    Blood Component Type RBC, LR IRR    Unit division 00    Status of Unit ALLOCATED    Transfusion Status OK TO TRANSFUSE    Crossmatch Result Compatible    Unit Number [id number]    Blood Component Type RBC, LR IRR    Unit division 00  Status of Unit ALLOCATED    Transfusion Status OK TO TRANSFUSE    Crossmatch Result Compatible   Prepare RBC     Status: None   Collection Time: 08/22/17  3:00 PM  Result Value Ref Range   Order Confirmation ORDER PROCESSED BY BLOOD BANK    Recent Results (from the past 240 hour(s))  TECHNOLOGIST REVIEW     Status: None   Collection Time: 08/20/17 11:59 AM  Result Value Ref Range Status   Technologist Review   Final    Rare blast and myelocyte, mod ovalos, few fragments, helmets and teardrops    Renal Function:  Recent Labs  08/21/17 0309 08/22/17 1339  CREATININE 1.01 1.05   Estimated Creatinine  Clearance: 55.7 mL/min (by C-G formula based on SCr of 1.05 mg/dL).  Radiologic Imaging: Dg Abd Acute W/chest  Result Date: 08/21/2017 CLINICAL DATA:  Abdominal pain. EXAM: DG ABDOMEN ACUTE W/ 1V CHEST COMPARISON:  June 22, 2017. FINDINGS: There is no evidence of dilated bowel loops or free intraperitoneal air. No radiopaque calculi or other significant radiographic abnormality is seen. Unchanged prostatic seed implants. Degenerative changes of the lumbar spine. Heart size and mediastinal contours are within normal limits. Both lungs are clear. IMPRESSION: Negative abdominal radiographs.  No acute cardiopulmonary disease. Electronically Signed   By: Titus Dubin M.D.   On: 08/21/2017 18:03    I independently reviewed the above imaging studies.  Impression/Recommendation Gross hematuria Pancytopenia, hx of myelodysplastic syndrome Acute cystitis History of prostate cancer treated with brachytherapy in 2002  -I placed his foley catheter on slight traction and instilled an additional 20 mL (for a total of 30 mL) in his catheter balloon in hopes of tamponading any prostatic bleeding and restarted his CBI, which needs to continue overnight. His hematuria is likely a combination of his pancytopenia, retracting his initial catheter and acute cystitis.  He is being transfused pRBC with possible plans for a platelet transfusion.  Ok for a diet this evening.  Will make him NPO after midnight for possible cystoscopy with bladder fulguration tomorrow if his bleeding persists.  Continue IV Rocephin.  Conception Oms Winter, MD 08/22/2017, 6:06 PM

## 2017-08-23 ENCOUNTER — Other Ambulatory Visit: Payer: Medicare Other

## 2017-08-23 LAB — TYPE AND SCREEN
ABO/RH(D): A POS
ABO/RH(D): A POS
Antibody Screen: NEGATIVE
Antibody Screen: NEGATIVE
UNIT DIVISION: 0
Unit division: 0
Unit division: 0

## 2017-08-23 LAB — CBC
HCT: 28.1 % — ABNORMAL LOW (ref 39.0–52.0)
HEMOGLOBIN: 9.3 g/dL — AB (ref 13.0–17.0)
MCH: 28.5 pg (ref 26.0–34.0)
MCHC: 33.1 g/dL (ref 30.0–36.0)
MCV: 86.2 fL (ref 78.0–100.0)
Platelets: 35 10*3/uL — ABNORMAL LOW (ref 150–400)
RBC: 3.26 MIL/uL — ABNORMAL LOW (ref 4.22–5.81)
RDW: 17.8 % — AB (ref 11.5–15.5)
WBC: 2.1 10*3/uL — ABNORMAL LOW (ref 4.0–10.5)

## 2017-08-23 LAB — BPAM RBC
BLOOD PRODUCT EXPIRATION DATE: 201811162359
BLOOD PRODUCT EXPIRATION DATE: 201811162359
Blood Product Expiration Date: 201811122359
ISSUE DATE / TIME: 201810270832
ISSUE DATE / TIME: 201810281933
ISSUE DATE / TIME: 201810282246
UNIT TYPE AND RH: 6200
UNIT TYPE AND RH: 6200
Unit Type and Rh: 6200

## 2017-08-23 LAB — BASIC METABOLIC PANEL
Anion gap: 11 (ref 5–15)
BUN: 19 mg/dL (ref 6–20)
CALCIUM: 8.8 mg/dL — AB (ref 8.9–10.3)
CO2: 25 mmol/L (ref 22–32)
CREATININE: 1.12 mg/dL (ref 0.61–1.24)
Chloride: 102 mmol/L (ref 101–111)
GFR calc Af Amer: >60 mL/min (ref >60)
GFR, EST NON AFRICAN AMERICAN: 59 mL/min — AB (ref >60)
GLUCOSE: 107 mg/dL — AB (ref 65–99)
Potassium: 3.6 mmol/L (ref 3.5–5.1)
Sodium: 138 mmol/L (ref 135–145)

## 2017-08-23 LAB — PROTIME-INR
INR: 1.27
Prothrombin Time: 15.8 seconds — ABNORMAL HIGH (ref 11.4–15.2)

## 2017-08-23 LAB — APTT: APTT: 33 s (ref 24–36)

## 2017-08-23 MED ORDER — DEXTROSE 5 % IV SOLN
1.0000 g | INTRAVENOUS | Status: DC
  Administered 2017-08-23 – 2017-08-25 (×3): 1 g via INTRAVENOUS
  Filled 2017-08-23 (×4): qty 10

## 2017-08-23 MED ORDER — HALOPERIDOL LACTATE 5 MG/ML IJ SOLN: 2.5000 mg | Freq: Four times a day (QID) | INTRAMUSCULAR | Status: DC | PRN

## 2017-08-23 MED ORDER — ORAL CARE MOUTH RINSE
15.0000 mL | Freq: Two times a day (BID) | OROMUCOSAL | Status: DC
  Administered 2017-08-23 – 2017-08-27 (×7): 15 mL via OROMUCOSAL

## 2017-08-23 MED ORDER — HALOPERIDOL LACTATE 5 MG/ML IJ SOLN
1.0000 mg | Freq: Four times a day (QID) | INTRAMUSCULAR | Status: DC | PRN
  Administered 2017-08-23: 1 mg via INTRAVENOUS
  Filled 2017-08-23 (×4): qty 1

## 2017-08-23 MED ORDER — HALOPERIDOL LACTATE 5 MG/ML IJ SOLN
1.0000 mg | Freq: Four times a day (QID) | INTRAMUSCULAR | Status: DC | PRN
  Administered 2017-08-23 – 2017-08-24 (×2): 1 mg via INTRAVENOUS
  Filled 2017-08-23: qty 1

## 2017-08-23 MED ORDER — LORAZEPAM 2 MG/ML IJ SOLN: 0.5000 mg | Freq: Once | INTRAMUSCULAR | Status: DC

## 2017-08-23 MED ORDER — OXYCODONE-ACETAMINOPHEN 7.5-325 MG PO TABS
0.5000 | ORAL_TABLET | Freq: Three times a day (TID) | ORAL | Status: DC | PRN
  Administered 2017-08-23 – 2017-08-27 (×7): 1 via ORAL
  Filled 2017-08-23 (×8): qty 1

## 2017-08-23 MED ORDER — HALOPERIDOL LACTATE 5 MG/ML IJ SOLN
2.5000 mg | Freq: Once | INTRAMUSCULAR | Status: AC
  Administered 2017-08-23: 2.5 mg via INTRAVENOUS

## 2017-08-23 NOTE — Progress Notes (Signed)
PROGRESS NOTE    DREAM NODAL  KNL:[id number] DOB: 1932-07-28 DOA: 08/22/2017 PCP: [name], MD    Brief Narrative: Jordan Terry is Jordan Terry 83 y.o. male  with past medical history of prostate cancer, Grovers disease, MDS and anxiety presents with hematuria.  Patient has recently been seen by multiple providers for this issue.  He has received multiple units of blood and platelets.  He continues to bleed and has been passing large clots of blood   Assessment & Plan:   Principal Problem:   Hematuria Active Problems:   Myelodysplastic syndrome ([place])   Hematuria/Urinary Retention/Pyuria Mgmt per Urology Ucx pending, looks like urology rec ceftriaxone, but not given yet.  Will order today, f/u cx.   CBI Cont oxybutin  Herpes Cont zovirx  Cont diflucan  MDS Daily CBC  Low plts Irradiate Plts transfusion this morning  Depression Cont pamelor  Anemia - Hb to 9/3 from 7.8, continue to monitor S/p 2u PRBCs ordered by EDP  Chronic Pain Cont gabapentin  Insomnia Ativan qhs Holding restoril  Grover's Dz Kenalog qam to back and chest   DVT prophylaxis: SCD Code Status: full  Family Communication: none at bedside Disposition Plan: pending   Consultants:   urology  Procedures: (Don't include imaging studies which can be auto populated. Include things that cannot be auto populated i.e. Echo, Carotid and venous dopplers, Foley, Bipap, HD, tubes/drains, wound vac, central lines etc)  none  Antimicrobials: (specify start and planned stop date. Auto populated tables are space occupying and do not give end dates)  ceftriaxone    Subjective: C/o spasms overnight.  Hard time sleeping bc of this. Denies LH or dizziness recently. No other complaints.    Objective: Vitals:   08/23/17 0200 08/23/17 0418 08/23/17 0500 08/23/17 0600  BP: 123/79  119/63 (!) 125/49  Pulse: 88  (!) 101 92  Resp: (!) 24  20 19   Temp:  98.3 F (36.8 C)    TempSrc:   Oral    SpO2: 96%  98% 97%  Weight:   70.8 kg (156 lb 1.4 oz)   Height:        Intake/Output Summary (Last 24 hours) at 08/23/17 0745 Last data filed at 08/23/17 0500  Gross per 24 hour  Intake          9106.25 ml  Output             2075 ml  Net          7031.25 ml   Filed Weights   08/22/17 2136 08/23/17 0500  Weight: 71 kg (156 lb 8.4 oz) 70.8 kg (156 lb 1.4 oz)    Examination:  General exam: Appears calm and comfortable  Respiratory system: Clear to auscultation. Respiratory effort normal. Cardiovascular system: S1 & S2 heard, RRR. No JVD, murmurs, rubs, gallops or clicks. No pedal edema. Gastrointestinal system: Abdomen is nondistended, soft and nontender. No organomegaly or masses felt. Normal bowel sounds heard. GU: foley in place with dried blood at meatus.  Blood tinged output in foley bag.  Central nervous system: Alert and oriented. No focal neurological deficits. Extremities: Moving all extremities Skin: No rashes, lesions or ulcers Psychiatry: Judgement and insight appear normal. Mood & affect appropriate.     Data Reviewed: I have personally reviewed following labs and imaging studies  CBC:  Recent Labs Lab 08/20/17 1159 08/21/17 0309 08/21/17 1825 08/22/17 1230 08/23/17 0401  WBC 1.5* 1.9* 1.9* 2.3* 2.1*  NEUTROABS 0.4* 0.5* 1.0* 1.2*  --  HGB 7.0* 7.6* 8.9* 7.8* 9.3*  HCT 21.9* 22.4* 26.0* 23.2* 28.1*  MCV 90.1 87.8 86.7 85.3 86.2  PLT 26* 66* 47* 33* 35*   Basic Metabolic Panel:  Recent Labs Lab 08/21/17 0309 08/22/17 1339 08/23/17 0401  NA 142 136 138  K 4.1 3.6 3.6  CL 105 102 102  CO2 29 24 25   GLUCOSE 106* 115* 107*  BUN 19 17 19   CREATININE 1.01 1.05 1.12  CALCIUM 9.3 8.8* 8.8*   GFR: Estimated Creatinine Clearance: 50.9 mL/min (by C-G formula based on SCr of 1.12 mg/dL). Liver Function Tests: No results for input(s): AST, ALT, ALKPHOS, BILITOT, PROT, ALBUMIN in the last 168 hours. No results for input(s): LIPASE, AMYLASE in  the last 168 hours. No results for input(s): AMMONIA in the last 168 hours. Coagulation Profile:  Recent Labs Lab 08/23/17 0401  INR 1.27   Cardiac Enzymes: No results for input(s): CKTOTAL, CKMB, CKMBINDEX, TROPONINI in the last 168 hours. BNP (last 3 results) No results for input(s): PROBNP in the last 8760 hours. HbA1C: No results for input(s): HGBA1C in the last 72 hours. CBG: No results for input(s): GLUCAP in the last 168 hours. Lipid Profile: No results for input(s): CHOL, HDL, LDLCALC, TRIG, CHOLHDL, LDLDIRECT in the last 72 hours. Thyroid Function Tests: No results for input(s): TSH, T4TOTAL, FREET4, T3FREE, THYROIDAB in the last 72 hours. Anemia Panel: No results for input(s): VITAMINB12, FOLATE, FERRITIN, TIBC, IRON, RETICCTPCT in the last 72 hours. Sepsis Labs: No results for input(s): PROCALCITON, LATICACIDVEN in the last 168 hours.  Recent Results (from the past 240 hour(s))  TECHNOLOGIST REVIEW     Status: None   Collection Time: 08/20/17 11:59 AM  Result Value Ref Range Status   Technologist Review   Final    Rare blast and myelocyte, mod ovalos, few fragments, helmets and teardrops  MRSA PCR Screening     Status: None   Collection Time: 08/22/17  9:58 PM  Result Value Ref Range Status   MRSA by PCR NEGATIVE NEGATIVE Final    Comment:        The GeneXpert MRSA Assay (FDA approved for NASAL specimens only), is one component of Jordan Terry comprehensive MRSA colonization surveillance program. It is not intended to diagnose MRSA infection nor to guide or monitor treatment for MRSA infections.          Radiology Studies: Dg Abd Acute W/chest  Result Date: 08/21/2017 CLINICAL DATA:  Abdominal pain. EXAM: DG ABDOMEN ACUTE W/ 1V CHEST COMPARISON:  June 22, 2017. FINDINGS: There is no evidence of dilated bowel loops or free intraperitoneal air. No radiopaque calculi or other significant radiographic abnormality is seen. Unchanged prostatic seed implants.  Degenerative changes of the lumbar spine. Heart size and mediastinal contours are within normal limits. Both lungs are clear. IMPRESSION: Negative abdominal radiographs.  No acute cardiopulmonary disease. Electronically Signed   By: Titus Dubin M.D.   On: 08/21/2017 18:03        Scheduled Meds: . acyclovir  400 mg Oral Daily  . fluconazole  200 mg Oral Daily  . gabapentin  200 mg Oral TID  . LORazepam  1 mg Oral QHS  . mouth rinse  15 mL Mouth Rinse BID  . nortriptyline  25 mg Oral QHS  . oxybutynin  5 mg Oral TID  . triamcinolone cream  1 application Topical q morning - 10a   Continuous Infusions: . sodium chloride       LOS: 1 day  Time spent: over 30 min    Fayrene Helper, MD [facility] Pager [phone]  If 7PM-7AM, please contact night-coverage [email or web address] Password [email or web address] 08/23/2017, 7:45 AM

## 2017-08-23 NOTE — Care Management Note (Signed)
Case Management Note  Patient Details  Name: Jordan Terry MRN: [id number] Date of Birth: February 14, 1932  Subjective/Objective:                  Lower gi bleed  Action/Plan: Date:  August 23, 2017 Chart reviewed for concurrent status and case management needs.  Will continue to follow patient progress.  Discharge Planning: following for needs  Expected discharge date: [id number]  Velva Harman, BSN, [place], [place]  Expected Discharge Date:                  Expected Discharge Plan:  Home/Self Care  In-House Referral:     Discharge planning Services  CM Consult  Post Acute Care Choice:    Choice offered to:     DME Arranged:    DME Agency:     HH Arranged:    HH Agency:     Status of Service:  In process, will continue to follow  If discussed at Long Length of Stay Meetings, dates discussed:    Additional Comments:  Leeroy Cha, RN 08/23/2017, 8:57 AM

## 2017-08-23 NOTE — Progress Notes (Signed)
Patient has become increasingly confused throughout the day. Patient now attempting to get out of bed and agitated. Family made aware and unable to come sit with patient. Orders written for Haldol, soft waist restraint, and safety sitter. Order for Haldol given but ineffective and safety sitter unavailable. Telesitter in place. Will continue to monitor and redirect patient as needed.

## 2017-08-23 NOTE — Progress Notes (Signed)
Patient ID: Jordan Terry, male   DOB: 1932/03/29, 83 y.o.   MRN: [id number]    Subjective: Pt has remained hemodynamically stable overnight.  CBI gradually being titrated down.  Objective: Vital signs in last 24 hours: Temp:  [97.6 F (36.4 C)-99.4 F (37.4 C)] 98.1 F (36.7 C) (10/29 0952) Pulse Rate:  [77-103] 90 (10/29 0952) Resp:  [12-29] 22 (10/29 0952) BP: (94-147)/(49-80) 125/52 (10/29 0825) SpO2:  [94 %-100 %] 100 % (10/29 0952) Weight:  [70.8 kg (156 lb 1.4 oz)-71 kg (156 lb 8.4 oz)] 70.8 kg (156 lb 1.4 oz) (10/29 0500)  Intake/Output from previous day: 10/28 0701 - 10/29 0700 In: 9106.3 [I.V.:20; Blood:886.3] Out: 2075 [Urine:2075] Intake/Output this shift: Total I/O In: 3000 [Other:3000] Out: 4000 [Urine:4000]  Physical Exam:  General: Alert and oriented GU: 22 Fr 3 way catheter in place.  Urine is only slightly pink-tinged on slow CBI drip.  Lab Results:  Recent Labs  08/21/17 1825 08/22/17 1230 08/23/17 0401  HGB 8.9* 7.8* 9.3*  HCT 26.0* 23.2* 28.1*   CBC Latest Ref Rng & Units 08/23/2017 08/22/2017 08/21/2017  WBC 4.0 - 10.5 K/uL 2.1(L) 2.3(L) 1.9(L)  Hemoglobin 13.0 - 17.0 g/dL 9.3(L) 7.8(L) 8.9(L)  Hematocrit 39.0 - 52.0 % 28.1(L) 23.2(L) 26.0(L)  Platelets 150 - 400 K/uL 35(L) 33(L) 47(L)     BMET  Recent Labs  08/22/17 1339 08/23/17 0401  NA 136 138  K 3.6 3.6  CL 102 102  CO2 24 25  GLUCOSE 115* 107*  BUN 17 19  CREATININE 1.05 1.12  CALCIUM 8.8* 8.8*     Studies/Results: Dg Abd Acute W/chest  Result Date: 08/21/2017 CLINICAL DATA:  Abdominal pain. EXAM: DG ABDOMEN ACUTE W/ 1V CHEST COMPARISON:  June 22, 2017. FINDINGS: There is no evidence of dilated bowel loops or free intraperitoneal air. No radiopaque calculi or other significant radiographic abnormality is seen. Unchanged prostatic seed implants. Degenerative changes of the lumbar spine. Heart size and mediastinal contours are within normal limits. Both lungs are clear.  IMPRESSION: Negative abdominal radiographs.  No acute cardiopulmonary disease. Electronically Signed   By: Titus Dubin M.D.   On: 08/21/2017 18:03   Catheterized urine culture from 10/26 - negative  Assessment/Plan: Gross hematuria: Likely secondary to trauma from catheter.  Appears to be clearing with CBI.  He is to receive platelet transfusion today.  No indication for surgical intervention currently. Will continue.   LOS: 1 day   Judithann Villamar,LES 08/23/2017, 9:56 AM

## 2017-08-24 LAB — CBC
HCT: 27.3 % — ABNORMAL LOW (ref 39.0–52.0)
Hemoglobin: 9.3 g/dL — ABNORMAL LOW (ref 13.0–17.0)
MCH: 29.7 pg (ref 26.0–34.0)
MCHC: 34.1 g/dL (ref 30.0–36.0)
MCV: 87.2 fL (ref 78.0–100.0)
PLATELETS: 39 10*3/uL — AB (ref 150–400)
RBC: 3.13 MIL/uL — ABNORMAL LOW (ref 4.22–5.81)
RDW: 17.9 % — AB (ref 11.5–15.5)
WBC: 2.6 10*3/uL — AB (ref 4.0–10.5)

## 2017-08-24 LAB — URINE CULTURE: Culture: 40000 — AB

## 2017-08-24 LAB — BASIC METABOLIC PANEL
ANION GAP: 10 (ref 5–15)
BUN: 19 mg/dL (ref 6–20)
CALCIUM: 8.5 mg/dL — AB (ref 8.9–10.3)
CO2: 26 mmol/L (ref 22–32)
Chloride: 100 mmol/L — ABNORMAL LOW (ref 101–111)
Creatinine, Ser: 1.12 mg/dL (ref 0.61–1.24)
GFR calc Af Amer: >60 mL/min (ref >60)
GFR, EST NON AFRICAN AMERICAN: 59 mL/min — AB (ref >60)
GLUCOSE: 118 mg/dL — AB (ref 65–99)
Potassium: 3.4 mmol/L — ABNORMAL LOW (ref 3.5–5.1)
SODIUM: 136 mmol/L (ref 135–145)

## 2017-08-24 LAB — PREPARE PLATELET PHERESIS: UNIT DIVISION: 0

## 2017-08-24 LAB — BPAM PLATELET PHERESIS
BLOOD PRODUCT EXPIRATION DATE: 201810312359
ISSUE DATE / TIME: 201810290952
Unit Type and Rh: 7300

## 2017-08-24 MED ORDER — HALOPERIDOL LACTATE 5 MG/ML IJ SOLN
2.5000 mg | Freq: Four times a day (QID) | INTRAMUSCULAR | Status: DC | PRN
  Administered 2017-08-24 – 2017-08-26 (×2): 2.5 mg via INTRAVENOUS
  Filled 2017-08-24 (×2): qty 1

## 2017-08-24 MED ORDER — QUETIAPINE FUMARATE 25 MG PO TABS
25.0000 mg | ORAL_TABLET | Freq: Every day | ORAL | Status: DC
  Administered 2017-08-24 – 2017-08-26 (×3): 25 mg via ORAL
  Filled 2017-08-24 (×3): qty 1

## 2017-08-24 MED ORDER — HALOPERIDOL LACTATE 5 MG/ML IJ SOLN
2.0000 mg | Freq: Once | INTRAMUSCULAR | Status: AC
  Administered 2017-08-24: 2 mg via INTRAVENOUS

## 2017-08-24 NOTE — Progress Notes (Signed)
Pt. transferred to 1509. Report given to RN Novella Rob)

## 2017-08-24 NOTE — Progress Notes (Signed)
PT Cancellation Note  Patient Details Name: JAVYON FONTAN MRN: [id number] DOB: 1931/12/04   Cancelled Treatment:    Reason Eval/Treat Not Completed: Medical issues which prohibited therapy, incr. HR, requiring retraints.    Claretha Cooper 08/24/2017, 11:27 AM Tresa Endo PT [phone]

## 2017-08-24 NOTE — Progress Notes (Signed)
As per Dr. Lynne Logan note during his morning assessment he had turned CBI off. Paged Dr. Alinda Money to confirm if CBI needed to be discontinued. Verbal orders were given to slowly titrate CBI to off provided patient is no longer passing clots or has bloody urine.  Pt. continues to have bloody urine. Will continue to monitor and titrate as needed.

## 2017-08-24 NOTE — Progress Notes (Signed)
OT Cancellation Note  Patient Details Name: JAKADEN OUZTS MRN: [id number] DOB: July 18, 1932   Cancelled Treatment:    Reason Eval/Treat Not Completed: Other (comment).  Pt with agitation (and high HR) at this time. Will check back later or tomorrow.  Mykell Rawl 08/24/2017, 11:40 AM  Lesle Chris, OTR/L [phone] 08/24/2017

## 2017-08-24 NOTE — Progress Notes (Signed)
Pt agitated and confused, now trying to get out of bed. Notified Dr. Florene Glen. Orders for 2 mg Haldol prn and B/L wrist and ankle restraints given.

## 2017-08-24 NOTE — Progress Notes (Signed)
PROGRESS NOTE    Jordan Terry  YQM:[id number] DOB: [birth year] DOA: 08/22/2017 PCP: Jordan Battles, MD    Brief Narrative: Jordan Terry is Jordan Terry 83 y.o. male  with past medical history of prostate cancer, Grovers disease, MDS and anxiety presents with hematuria.  Patient has recently been seen by multiple providers for this issue.  He has received multiple units of blood and platelets.  He continues to bleed and has been passing large clots of blood   Assessment & Plan:   Principal Problem:   Hematuria Active Problems:   Myelodysplastic syndrome (HCC)   Hematuria/Urinary Retention/E. Coli UTI Mgmt per Urology Ucx with e. Coli, sensitivities pending, ceftriaxone CBI Cont oxybutin  Delirium - apparently has been more agitated at home recently.  Agitated here in the hospital as well requiring haldol prn.  Currently with safety sitter and safety belt.  Delirium precautions Haldol prn, consider adding seroquel qhs  Herpes Cont zovirx  Cont diflucan  MDS Daily CBC  Low plts S/p platelet transfusion   Depression Cont pamelor  Anemia - Hb stable at 9.3 from 7.8, continue to monitor.  S/p 2u PRBCs ordered by EDP  Chronic Pain Cont gabapentin  Insomnia Ativan qhs Holding restoril  Grover's Dz Kenalog qam to back and chest   DVT prophylaxis: SCD Code Status: full  Family Communication: none at bedside Disposition Plan: pending   Consultants:   urology  Procedures: (Don't include imaging studies which can be auto populated. Include things that cannot be auto populated i.e. Echo, Carotid and venous dopplers, Foley, Bipap, HD, tubes/drains, wound vac, central lines etc)  none  Antimicrobials: (specify start and planned stop date. Auto populated tables are space occupying and do not give end dates)  ceftriaxone    Subjective: C/o hard time sleeping. Jordan Terry&Ox2 (didn't know month). Agitated.   Objective: Vitals:   08/24/17 0400 08/24/17 0521  08/24/17 0600 08/24/17 0733  BP: (!) 179/74  129/75   Pulse: (!) 112  (!) 103   Resp: 17  14   Temp:  99 F (37.2 C)  98.1 F (36.7 C)  TempSrc:  Oral  Oral  SpO2: 96%  97%   Weight:      Height:        Intake/Output Summary (Last 24 hours) at 08/24/17 0741 Last data filed at 08/24/17 0700  Gross per 24 hour  Intake            10549 ml  Output             8250 ml  Net             2299 ml   Filed Weights   08/22/17 2136 08/23/17 0500  Weight: 71 kg (156 lb 8.4 oz) 70.8 kg (156 lb 1.4 oz)    Examination:  General: No acute distress.  Fidgety  Cardiovascular: [name], and rhythm. No gallops or rubs. No murmurs. No JVD. Lungs: Clear to auscultation bilaterally with good air movement. No rales, rhonchi or wheezes. Abdomen: Soft, nontender, nondistended with normal active bowel sounds. No masses. No hepatosplenomegaly. Neurological: Alert and oriented 2. Moves all extremities 4 with equal strength. Cranial nerves II through XII grossly intact. Skin: Warm and dry. No rashes or lesions. Extremities: No clubbing or cyanosis. No edema. Pedal pulses 2+. Psychiatric:  Insight and judgment are impaired.   Data Reviewed: I have personally reviewed following labs and imaging studies  CBC:  Recent Labs Lab 08/20/17 1159 08/21/17  0309 08/21/17 1825 08/22/17 1230 08/23/17 0401 08/24/17 0334  WBC 1.5* 1.9* 1.9* 2.3* 2.1* 2.6*  NEUTROABS 0.4* 0.5* 1.0* 1.2*  --   --   HGB 7.0* 7.6* 8.9* 7.8* 9.3* 9.3*  HCT 21.9* 22.4* 26.0* 23.2* 28.1* 27.3*  MCV 90.1 87.8 86.7 85.3 86.2 87.2  PLT 26* 66* 47* 33* 35* 39*   Basic Metabolic Panel:  Recent Labs Lab 08/21/17 0309 08/22/17 1339 08/23/17 0401 08/24/17 0334  NA 142 136 138 136  K 4.1 3.6 3.6 3.4*  CL 105 102 102 100*  CO2 29 24 25 26   GLUCOSE 106* 115* 107* 118*  BUN 19 17 19 19   CREATININE 1.01 1.05 1.12 1.12  CALCIUM 9.3 8.8* 8.8* 8.5*   GFR: Estimated Creatinine Clearance: 50.9 mL/min (by  C-G formula based on SCr of 1.12 mg/dL). Liver Function Tests: No results for input(s): AST, ALT, ALKPHOS, BILITOT, PROT, ALBUMIN in the last 168 hours. No results for input(s): LIPASE, AMYLASE in the last 168 hours. No results for input(s): AMMONIA in the last 168 hours. Coagulation Profile:  Recent Labs Lab 08/23/17 0401  INR 1.27   Cardiac Enzymes: No results for input(s): CKTOTAL, CKMB, CKMBINDEX, TROPONINI in the last 168 hours. BNP (last 3 results) No results for input(s): PROBNP in the last 8760 hours. HbA1C: No results for input(s): HGBA1C in the last 72 hours. CBG: No results for input(s): GLUCAP in the last 168 hours. Lipid Profile: No results for input(s): CHOL, HDL, LDLCALC, TRIG, CHOLHDL, LDLDIRECT in the last 72 hours. Thyroid Function Tests: No results for input(s): TSH, T4TOTAL, FREET4, T3FREE, THYROIDAB in the last 72 hours. Anemia Panel: No results for input(s): VITAMINB12, FOLATE, FERRITIN, TIBC, IRON, RETICCTPCT in the last 72 hours. Sepsis Labs: No results for input(s): PROCALCITON, LATICACIDVEN in the last 168 hours.  Recent Results (from the past 240 hour(s))  TECHNOLOGIST REVIEW     Status: None   Collection Time: 08/20/17 11:59 AM  Result Value Ref Range Status   Technologist Review   Final    Rare blast and myelocyte, mod ovalos, few fragments, helmets and teardrops  Urine culture     Status: Abnormal (Preliminary result)   Collection Time: 08/22/17  3:00 PM  Result Value Ref Range Status   Specimen Description URINE, CATHETERIZED  Final   Special Requests NONE  Final   Culture 40,[name]  Final   Report Status PENDING  Incomplete  MRSA PCR Screening     Status: None   Collection Time: 08/22/17  9:58 PM  Result Value Ref Range Status   MRSA by PCR NEGATIVE NEGATIVE Final    Comment:        The GeneXpert MRSA Assay (FDA approved for NASAL specimens only), is one component of Jordan Terry [name]          Radiology Studies: No results found.      Scheduled Meds: . acyclovir  400 mg Oral Daily  . fluconazole  200 mg Oral Daily  . gabapentin  200 mg Oral TID  . LORazepam  1 mg Oral QHS  . mouth rinse  15 mL Mouth Rinse BID  . nortriptyline  25 mg Oral QHS  . oxybutynin  5 mg Oral TID  . triamcinolone cream  1 application Topical q morning - 10a   Continuous Infusions: . sodium chloride    .  cefTRIAXone (ROCEPHIN)  IV Stopped (08/23/17 1503)     LOS: 2 days    Time spent: over 20 min    Jordan Helper, MD [facility] Pager [phone]  If 7PM-7AM, please contact night-coverage [email or web address] Password [email or web address] 08/24/2017, 7:41 AM

## 2017-08-24 NOTE — Progress Notes (Signed)
Patient ID: Jordan Terry, male   DOB: [birth year], 83 y.o.   MRN: [id number]    Subjective: Pt with confusion overnight.  He has been pulling at catheter and IV.  Did not pull catheter out though.  Received platelet transfusion yesterday.  Objective: Vital signs in last 24 hours: Temp:  [98.1 F (36.7 C)-100.3 F (37.9 C)] 98.1 F (36.7 C) (10/30 0733) Pulse Rate:  [87-132] 103 (10/30 0600) Resp:  [12-25] 14 (10/30 0600) BP: (123-189)/(52-97) 129/75 (10/30 0600) SpO2:  [90 %-100 %] 97 % (10/30 0600)  Intake/Output from previous day: 10/29 0701 - 10/30 0700 In: 78295 [P.O.:120; Blood:279; IV Piggyback:50] Out: 10000 [Urine:10000] Intake/Output this shift: No intake/output data recorded.  Physical Exam:  General: Alert and oriented CV: RRR Lungs: Clear Abdomen: Soft, ND Incisions: Ext: NT, No erythema  Lab Results:  Recent Labs  08/22/17 1230 08/23/17 0401 08/24/17 0334  HGB 7.8* 9.3* 9.3*  HCT 23.2* 28.1* 27.3*   Lab Results  Component Value Date   WBC 2.6 (L) 08/24/2017   HGB 9.3 (L) 08/24/2017   HCT 27.3 (L) 08/24/2017   MCV 87.2 08/24/2017   PLT 39 (L) 08/24/2017     BMET  Recent Labs  08/23/17 0401 08/24/17 0334  NA 138 136  K 3.6 3.4*  CL 102 100*  CO2 25 26  GLUCOSE 107* 118*  BUN 19 19  CREATININE 1.12 1.12  CALCIUM 8.8* 8.5*      Assessment/Plan: 1) Hematuria in setting of catheter trauma and thrombocytopenia:  Continue catheter drainage.  CBI currently off.  Continue to monitor.  No indication for intervention.  Patient's attempts to pull on catheter likely exacerbating bleeding from trauma. Continue supportive transfusion of blood products as necessary.  Hgb stable.  Confusion may improve if able to move from ICU today.  Would expect he will need catheter in place for about 2 weeks due to recent trauma and desire to avoid intermittent catheterization right now.  2) E. coli UTI: Continue ceftriaxone pending sensitivities.    LOS: 2  days   Jordan Terry,LES 08/24/2017, 7:47 AM

## 2017-08-25 DIAGNOSIS — T83091A Other mechanical complication of indwelling urethral catheter, initial encounter: Principal | ICD-10-CM

## 2017-08-25 DIAGNOSIS — D62 Acute posthemorrhagic anemia: Secondary | ICD-10-CM

## 2017-08-25 LAB — BASIC METABOLIC PANEL
Anion gap: 12 (ref 5–15)
BUN: 23 mg/dL — ABNORMAL HIGH (ref 6–20)
CHLORIDE: 105 mmol/L (ref 101–111)
CO2: 22 mmol/L (ref 22–32)
Calcium: 9 mg/dL (ref 8.9–10.3)
Creatinine, Ser: 1.1 mg/dL (ref 0.61–1.24)
GFR calc non Af Amer: >60 mL/min (ref >60)
GLUCOSE: 135 mg/dL — AB (ref 65–99)
POTASSIUM: 3.6 mmol/L (ref 3.5–5.1)
SODIUM: 139 mmol/L (ref 135–145)

## 2017-08-25 LAB — CBC
HCT: 27.9 % — ABNORMAL LOW (ref 39.0–52.0)
HEMOGLOBIN: 9.6 g/dL — AB (ref 13.0–17.0)
MCH: 29.9 pg (ref 26.0–34.0)
MCHC: 34.4 g/dL (ref 30.0–36.0)
MCV: 86.9 fL (ref 78.0–100.0)
Platelets: 38 10*3/uL — ABNORMAL LOW (ref 150–400)
RBC: 3.21 MIL/uL — AB (ref 4.22–5.81)
RDW: 17.7 % — ABNORMAL HIGH (ref 11.5–15.5)
WBC: 3.1 10*3/uL — ABNORMAL LOW (ref 4.0–10.5)

## 2017-08-25 NOTE — Evaluation (Signed)
Occupational Therapy Evaluation Patient Details Name: Jordan Terry MRN: [id number] DOB: 1932-01-23 Today's Date: 08/25/2017    History of Present Illness Pt was admitted with hematuria. PMH:  prostate CA, vertigo, Grover's disease and anxiety   Clinical Impression   This 83 year old man was admitted for the above.  He was in bil mitt restraints to keep him from pulling on tubes.  Will continue to assess ADLs during next sessions.  Pt needs min +2 assistance for transfers/ambulating. Goals are for set up/supervision for UB and mod A for LB adls.    Follow Up Recommendations  SNF    Equipment Recommendations  3 in 1 bedside commode    Recommendations for Other Services       Precautions / Restrictions Precautions Precautions: Fall Restrictions Weight Bearing Restrictions: No      Mobility Bed Mobility Overal bed mobility: Needs Assistance Bed Mobility: Supine to Sit     Supine to sit: Min assist;+2 for safety/equipment     General bed mobility comments: light assist for trunk and legs to get to EOB  Transfers Overall transfer level: Needs assistance Equipment used: Rolling walker (2 wheeled) Transfers: Sit to/from Stand Sit to Stand: Min assist;+2 safety/equipment         General transfer comment: assist to rise and steady    Balance                                           ADL either performed or assessed with clinical judgement   ADL Overall ADL's : Needs assistance/impaired                         Toilet Transfer: Minimal assistance;RW;+2 for safety/equipment (chair) Toilet Transfer Details (indicate cue type and reason): assist to turn walker.  cues for stepping into walker and ignoring tubes           General ADL Comments: left bil mitts on hands as pt has been pulling at tubes and was distracted by them during this session. Will further evaluate ADLs when able to release him from these.  Pt used bil hands to bring  cup to mouth once then tried to lean forward to cup on second try.  ADLs very limited by mitts     Vision         Perception     Praxis      Pertinent Vitals/Pain Pain Assessment: No/denies pain     Hand Dominance     Extremity/Trunk Assessment Upper Extremity Assessment Upper Extremity Assessment: Generalized weakness           Communication Communication Communication: HOH   Cognition Arousal/Alertness: Awake/alert Behavior During Therapy: WFL for tasks assessed/performed (mitt restraints used) Overall Cognitive Status: No family/caregiver present to determine baseline cognitive functioning                                 General Comments: Pt was talking about low platelets, need to let oncologist know prior to any surgery, but he had some difficulty at times with commands especially with stepping into walker, turning walker.  Also distracted by tubes.  Pt is HOH   General Comments  no c/o dizziness.  HR up to 112 during ambulation    Exercises     Shoulder  Instructions      Home Living Family/patient expects to be discharged to:: Unsure                                 Additional Comments: per Nursing notes, pt lives at home with wife      Prior Functioning/Environment                   OT Problem List: Decreased strength;Decreased activity tolerance;Impaired balance (sitting and/or standing);Decreased safety awareness;Decreased cognition;Decreased knowledge of use of DME or AE;Cardiopulmonary status limiting activity      OT Treatment/Interventions: Self-care/ADL training;DME and/or AE instruction;Balance training;Therapeutic activities;Patient/family education;Cognitive remediation/compensation    OT Goals(Current goals can be found in the care plan section) Acute Rehab OT Goals Patient Stated Goal: none stated; agreeable to OOB OT Goal Formulation: Patient unable to participate in goal setting Time For Goal  Achievement: 09/08/17 Potential to Achieve Goals: Good ADL Goals Pt Will Perform Eating: Independently;sitting Pt Will Perform Grooming: with min guard assist;standing Pt Will Transfer to Toilet: with supervision;ambulating;bedside commode Pt Will Perform Toileting - Clothing Manipulation and hygiene: with min guard assist;sit to/from stand Additional ADL Goal #1: pt will perform UB adls with set up/supervision, no more than one vc per task Additional ADL Goal #2: pt will perform LB adls with mod A sit to stand; no more than 2 vcs per task  OT Frequency: Min 2X/week   Barriers to D/C:            Co-evaluation              AM-PAC PT "6 Clicks" Daily Activity     Outcome Measure Help from another person eating meals?: A Lot Help from another person taking care of personal grooming?: A Lot Help from another person toileting, which includes using toliet, bedpan, or urinal?: A Lot Help from another person bathing (including washing, rinsing, drying)?: Total Help from another person to put on and taking off regular upper body clothing?: A Lot Help from another person to put on and taking off regular lower body clothing?: Total 6 Click Score: 10   End of Session Nurse Communication: Mobility status (pt up in chair)  Activity Tolerance: Patient tolerated treatment well Patient left: in chair;with call bell/phone within reach;with chair alarm set  OT Visit Diagnosis: Unsteadiness on feet (R26.81)                Time: 9147-8295 OT Time Calculation (min): 24 min Charges:  OT General Charges $OT Visit: 1 Visit OT Evaluation $OT Eval Moderate Complexity: 1 Mod G-Codes:     [place], OTR/L [phone] 08/25/2017  Emmalin Jaquess 08/25/2017, 12:40 PM

## 2017-08-25 NOTE — Progress Notes (Signed)
PROGRESS NOTE    Jordan Terry  HYW:[id number] DOB: [birth year] DOA: 08/22/2017 PCP: [name], MD    Brief Narrative: Patient is an 83 y.o. male,  with past medical history of prostate cancer, Grovers disease, MDS, pancytopenia, and anxiety. Patient was admitted with hematuria.  He has received multiple units of blood and platelets.  Urology Team has been kind enough to assist with patient's management. Patient was on CBI, but currently on hold. During the hospital course, patient was noted to be pulling on the indwelling foley's catheter, and had to be restrained. Hemoglobin is 9.6g/dl today (Stable), and the platelet is 38 (Stable). Dry blood is noted around the penile meatus.  Assessment & Plan:   Principal Problem:   Hematuria Active Problems:   Myelodysplastic syndrome (HCC)   Hematuria/Urinary Retention/E. Coli UTI Mgmt per Urology Urine culture grew pan sensitive E. Coli. On I.V. ceftriaxone CBI is currently on hold. Cont oxybutin  Delirium - Manage expectantly. Rule out possible cognitive deficit when patient is back to baseline.  Delirium precautions Haldol prn, consider adding seroquel qhs  Herpes Cont zovirx  Cont diflucan  MDS Daily CBC  Low plts S/p platelet transfusion   Anemia - Hb stable at 9.6.g/dl   S/p 2u PRBCs ordered by EDP  Chronic Pain Cont gabapentin  Insomnia  Grover's Dz Kenalog qam to back and chest   DVT prophylaxis: SCD Code Status: full  Family Communication: none at bedside Disposition Plan: pending   Consultants:   urology  Procedures: (Don't include imaging studies which can be auto populated. Include things that cannot be auto populated i.e. Echo, Carotid and venous dopplers, Foley, Bipap, HD, tubes/drains, wound vac, central lines etc)  none  Antimicrobials: (specify start and planned stop date. Auto populated tables are space occupying and do not give end dates)  ceftriaxone    Subjective: No  new complaints. Not agitated. Mildly Lethargic. Oriented to month and year  Objective: Vitals:   08/25/17 0400 08/25/17 0600 08/25/17 0700 08/25/17 0747  BP: 140/81     Pulse: (!) 119 (!) 110 (!) 117   Resp: (!) 24 20 17    Temp:    98 F (36.7 C)  TempSrc:    Oral  SpO2: 97% 98% 99%   Weight:      Height:        Intake/Output Summary (Last 24 hours) at 08/25/17 0921 Last data filed at 08/25/17 0200  Gross per 24 hour  Intake              860 ml  Output             2075 ml  Net            -1215 ml   Filed Weights   08/22/17 2136 08/23/17 0500  Weight: 71 kg (156 lb 8.4 oz) 70.8 kg (156 lb 1.4 oz)    Examination:  General: No acute distress.  Mildly lethargic. Muscle wasting noted. HEENT - Pallor, no jaundice. Dry buccal mucosa Cardiovascular: Heart sounds show a regular rate, and rhythm. No gallops or rubs. No murmurs. No JVD. Lungs: Clear to auscultation bilaterally with good air movement. No rales, rhonchi or wheezes. Abdomen: Soft, nontender, nondistended with normal active bowel sounds. No masses. No hepatosplenomegaly. Neurological: Alert and oriented 2. Moves all extremities 4 with equal strength. Cranial nerves II through XII grossly intact. Skin: Warm and dry. No rashes or lesions. Extremities: No clubbing or cyanosis. No edema. Pedal pulses 2+.  Data Reviewed: I have personally reviewed following labs and imaging studies  CBC:  Recent Labs Lab 08/20/17 1159  08/21/17 0309 08/21/17 1825 08/22/17 1230 08/23/17 0401 08/24/17 0334 08/25/17 0339  WBC 1.5*  < > 1.9* 1.9* 2.3* 2.1* 2.6* 3.1*  NEUTROABS 0.4*  --  0.5* 1.0* 1.2*  --   --   --   HGB 7.0*  < > 7.6* 8.9* 7.8* 9.3* 9.3* 9.6*  HCT 21.9*  < > 22.4* 26.0* 23.2* 28.1* 27.3* 27.9*  MCV 90.1  < > 87.8 86.7 85.3 86.2 87.2 86.9  PLT 26*  < > 66* 47* 33* 35* 39* 38*  < > = values in this interval not displayed. Basic Metabolic Panel:  Recent Labs Lab 08/21/17 0309 08/22/17 1339 08/23/17 0401  08/24/17 0334 08/25/17 0339  NA 142 136 138 136 139  K 4.1 3.6 3.6 3.4* 3.6  CL 105 102 102 100* 105  CO2 29 24 25 26 22   GLUCOSE 106* 115* 107* 118* 135*  BUN 19 17 19 19  23*  CREATININE 1.01 1.05 1.12 1.12 1.10  CALCIUM 9.3 8.8* 8.8* 8.5* 9.0   GFR: Estimated Creatinine Clearance: 51.8 mL/min (by C-G formula based on SCr of 1.1 mg/dL). Liver Function Tests: No results for input(s): AST, ALT, ALKPHOS, BILITOT, PROT, ALBUMIN in the last 168 hours. No results for input(s): LIPASE, AMYLASE in the last 168 hours. No results for input(s): AMMONIA in the last 168 hours. Coagulation Profile:  Recent Labs Lab 08/23/17 0401  INR 1.27   Cardiac Enzymes: No results for input(s): CKTOTAL, CKMB, CKMBINDEX, TROPONINI in the last 168 hours. BNP (last 3 results) No results for input(s): PROBNP in the last 8760 hours. HbA1C: No results for input(s): HGBA1C in the last 72 hours. CBG: No results for input(s): GLUCAP in the last 168 hours. Lipid Profile: No results for input(s): CHOL, HDL, LDLCALC, TRIG, CHOLHDL, LDLDIRECT in the last 72 hours. Thyroid Function Tests: No results for input(s): TSH, T4TOTAL, FREET4, T3FREE, THYROIDAB in the last 72 hours. Anemia Panel: No results for input(s): VITAMINB12, FOLATE, FERRITIN, TIBC, IRON, RETICCTPCT in the last 72 hours. Sepsis Labs: No results for input(s): PROCALCITON, LATICACIDVEN in the last 168 hours.  Recent Results (from the past 240 hour(s))  TECHNOLOGIST REVIEW     Status: None   Collection Time: 08/20/17 11:59 AM  Result Value Ref Range Status   Technologist Review   Final    Rare blast and myelocyte, mod ovalos, few fragments, helmets and teardrops  Urine culture     Status: Abnormal   Collection Time: 08/22/17  3:00 PM  Result Value Ref Range Status   Specimen Description URINE, CATHETERIZED  Final   Special Requests NONE  Final   Culture 40,000 COLONIES/mL ESCHERICHIA COLI (A)  Final   Report Status 08/24/2017 FINAL  Final     Organism ID, Bacteria ESCHERICHIA COLI (A)  Final      Susceptibility   Escherichia coli - MIC*    AMPICILLIN <=2 SENSITIVE Sensitive     CEFAZOLIN <=4 SENSITIVE Sensitive     CEFTRIAXONE <=1 SENSITIVE Sensitive     CIPROFLOXACIN <=0.25 SENSITIVE Sensitive     GENTAMICIN <=1 SENSITIVE Sensitive     IMIPENEM <=0.25 SENSITIVE Sensitive     NITROFURANTOIN <=16 SENSITIVE Sensitive     TRIMETH/SULFA <=20 SENSITIVE Sensitive     AMPICILLIN/SULBACTAM <=2 SENSITIVE Sensitive     PIP/TAZO <=4 SENSITIVE Sensitive     Extended ESBL NEGATIVE Sensitive     *  40,000 COLONIES/mL ESCHERICHIA COLI  MRSA PCR Screening     Status: None   Collection Time: 08/22/17  9:58 PM  Result Value Ref Range Status   MRSA by PCR NEGATIVE NEGATIVE Final    Comment:        The GeneXpert MRSA Assay (FDA approved for NASAL specimens only), is one component of a comprehensive MRSA colonization surveillance program. It is not intended to diagnose MRSA infection nor to guide or monitor treatment for MRSA infections.          Radiology Studies: No results found.      Scheduled Meds: . acyclovir  400 mg Oral Daily  . fluconazole  200 mg Oral Daily  . gabapentin  200 mg Oral TID  . LORazepam  1 mg Oral QHS  . mouth rinse  15 mL Mouth Rinse BID  . nortriptyline  25 mg Oral QHS  . oxybutynin  5 mg Oral TID  . QUEtiapine  25 mg Oral QHS  . triamcinolone cream  1 application Topical q morning - 10a   Continuous Infusions: . sodium chloride    . cefTRIAXone (ROCEPHIN)  IV Stopped (08/24/17 1430)     LOS: 3 days    Bonnell Public, MD [facility] Pager [phone]  If 7PM-7AM, please contact night-coverage [email or web address] Password [email or web address] 08/25/2017, 9:21 AM

## 2017-08-25 NOTE — Evaluation (Signed)
Physical Therapy Evaluation Patient Details Name: Jordan Terry MRN: [id number] DOB: [birth year] Today's Date: 08/25/2017   History of Present Illness  Pt was admitted with hematuria. PMH:  prostate CA, vertigo, Grover's disease and anxiety  Clinical Impression  Pt admitted as above and presenting with functional mobility limitations 2* generalized weakness, balance deficits and current level of confusion.  Dependent on progress during acute stay, pt may require follow up SNF level rehab to maximize IND and safety prior to return home.    Follow Up Recommendations SNF    Equipment Recommendations  None recommended by PT    Recommendations for Other Services OT consult     Precautions / Restrictions Precautions Precautions: Fall Restrictions Weight Bearing Restrictions: No      Mobility  Bed Mobility Overal bed mobility: Needs Assistance Bed Mobility: Supine to Sit     Supine to sit: Min assist;+2 for safety/equipment     General bed mobility comments: light assist for trunk and legs to get to EOB  Transfers Overall transfer level: Needs assistance Equipment used: Rolling walker (2 wheeled) Transfers: Sit to/from Stand Sit to Stand: Min assist;+2 safety/equipment         General transfer comment: assist to rise and steady  Ambulation/Gait Ambulation/Gait assistance: Min assist;+2 safety/equipment Ambulation Distance (Feet): 111 Feet Assistive device: Rolling walker (2 wheeled) Gait Pattern/deviations: Step-to pattern;Step-through pattern;Decreased step length - right;Decreased step length - left;Shuffle;Trunk flexed Gait velocity: decr Gait velocity interpretation: Below normal speed for age/gender General Gait Details: cues for posture and position from [employer]            Wheelchair Mobility    Modified Rankin (Stroke Patients Only)       Balance Overall balance assessment: Needs assistance Sitting-balance support: No upper extremity  supported;Feet supported Sitting balance-Leahy Scale: Fair     Standing balance support: Bilateral upper extremity supported Standing balance-Leahy Scale: Poor                               Pertinent Vitals/Pain Pain Assessment: No/denies pain    Home Living Family/patient expects to be discharged to:: Unsure Living Arrangements: Spouse/significant other               Additional Comments: per Nursing notes, pt lives at home with wife    Prior Function Level of Independence: Independent         Comments: has cane but doesn't use it, daughter drives pt to appointments     Hand Dominance        Extremity/Trunk Assessment   Upper Extremity Assessment Upper Extremity Assessment: Generalized weakness    Lower Extremity Assessment Lower Extremity Assessment: Generalized weakness       Communication   Communication: HOH  Cognition Arousal/Alertness: Awake/alert Behavior During Therapy: WFL for tasks assessed/performed Overall Cognitive Status: No family/caregiver present to determine baseline cognitive functioning                                 General Comments: Pt was talking about low platelets, need to let oncologist know prior to any surgery, but he had some difficulty at times with commands especially with stepping into walker, turning walker.  Also distracted by tubes.  Pt is HOH      General Comments General comments (skin integrity, edema, etc.): No c/o dizziness    Exercises  Assessment/Plan    PT Assessment Patient needs continued PT services  PT Problem List Decreased strength;Decreased activity tolerance;Decreased balance;Decreased mobility;Decreased knowledge of use of DME;Decreased safety awareness       PT Treatment Interventions DME instruction;Gait training;Stair training;Functional mobility training;Therapeutic activities;Therapeutic exercise;Patient/family education    PT Goals (Current goals can be  found in the Care Plan section)  Acute Rehab PT Goals Patient Stated Goal: none stated; agreeable to OOB PT Goal Formulation: Patient unable to participate in goal setting Time For Goal Achievement: 09/08/17 Potential to Achieve Goals: Good    Frequency Min 3X/week   Barriers to discharge        Co-evaluation               AM-PAC PT "6 Clicks" Daily Activity  Outcome Measure Difficulty turning over in bed (including adjusting bedclothes, sheets and blankets)?: A Lot Difficulty moving from lying on back to sitting on the side of the bed? : Unable Difficulty sitting down on and standing up from a chair with arms (e.g., wheelchair, bedside commode, etc,.)?: Unable Help needed moving to and from a bed to chair (including a wheelchair)?: A Lot Help needed walking in hospital room?: A Lot Help needed climbing 3-5 steps with a railing? : A Lot 6 Click Score: 10    End of Session Equipment Utilized During Treatment: Gait belt Activity Tolerance: Patient limited by fatigue Patient left: in chair;with call bell/phone within reach;with chair alarm set Nurse Communication: Mobility status PT Visit Diagnosis: Unsteadiness on feet (R26.81);Muscle weakness (generalized) (M62.81);Difficulty in walking, not elsewhere classified (R26.2)    Time: 2831-5176 PT Time Calculation (min) (ACUTE ONLY): 23 min   Charges:   PT Evaluation $PT Eval Moderate Complexity: 1 Mod     PT G Codes:        Pg [phone]   Kayzlee Wirtanen 08/25/2017, 2:15 PM

## 2017-08-25 NOTE — Progress Notes (Signed)
Patient ID: Jordan Terry, male   DOB: August 22, 1932, 83 y.o.   MRN: [id number]    Subjective: Patient still with severe confusion.  Still in ICU.  Has been pulling at catheter and now restrained.  Objective: Vital signs in last 24 hours: Temp:  [98.1 F (36.7 C)-99.8 F (37.7 C)] 99.8 F (37.7 C) (10/30 1156) Pulse Rate:  [74-119] 117 (10/31 0700) Resp:  [16-40] 17 (10/31 0700) BP: (103-142)/(55-92) 140/81 (10/31 0400) SpO2:  [97 %-100 %] 99 % (10/31 0700)  Intake/Output from previous day: 10/30 0701 - 10/31 0700 In: 860 [P.O.:10; IV Piggyback:50] Out: 2075 [Urine:2075] Intake/Output this shift: No intake/output data recorded.  Physical Exam:  General: Alert and oriented GU: Urine red tinged but draining well.  Lab Results:  Recent Labs  08/23/17 0401 08/24/17 0334 08/25/17 0339  HGB 9.3* 9.3* 9.6*  HCT 28.1* 27.3* 27.9*   BMET  Recent Labs  08/24/17 0334 08/25/17 0339  NA 136 139  K 3.4* 3.6  CL 100* 105  CO2 26 22  GLUCOSE 118* 135*  BUN 19 23*  CREATININE 1.12 1.10  CALCIUM 8.5* 9.0     Studies/Results:  URINE, CATHETERIZED   Special Requests NONE   Culture 40,000 COLONIES/mL ESCHERICHIA COLI    Report Status 08/24/2017 FINAL   Organism ID, Bacteria ESCHERICHIA COLI    Resulting Agency [facility]  Susceptibility    Escherichia coli    MIC    AMPICILLIN <=2 SENSITIVE "><=2 SENSITIVE  Sensitive    AMPICILLIN/SULBACTAM <=2 SENSITIVE "><=2 SENSITIVE  Sensitive    CEFAZOLIN <=4 SENSITIVE "><=4 SENSITIVE  Sensitive    CEFTRIAXONE <=1 SENSITIVE "><=1 SENSITIVE  Sensitive    CIPROFLOXACIN <=0.25 SENSITIVE "><=0.25 SENS... Sensitive    Extended ESBL NEGATIVE  Sensitive    GENTAMICIN <=1 SENSITIVE "><=1 SENSITIVE  Sensitive    IMIPENEM <=0.25 SENSITIVE "><=0.25 SENS... Sensitive    NITROFURANTOIN <=16 SENSITIVE "><=16 SENSIT... Sensitive    PIP/TAZO <=4 SENSITIVE "><=4 SENSITIVE  Sensitive    TRIMETH/SULFA <=20 SENSITIVE "><=20 SENSIT... Sensitive        Susceptibility Comments   Escherichia coli         Assessment/Plan: 1) Hematuria: Likely related to catheter trauma and thrombocytopenia.  This is not likely to get better if patient continues to manipulate his catheter.  Hopefully, he can be transferred out of ICU today which would be helpful with psychosis. Will hold CBI currently and monitor.  Continue restraints if patient is confused and wanting to pull on catheter. 2) UTI: Can transition to po antibiotics per culture sensitivities.   LOS: 3 days   Shylo Dillenbeck,LES 08/25/2017, 7:25 AM

## 2017-08-25 NOTE — Progress Notes (Signed)
Patient unable to transfer spent night in stepdown. He continues to be confused and calling out of "[facility]" to help him. Continued to require restraints and sitter. Attempted to allow more freedom with hands and he started pulling at equipment and IVs. Tried to pull on foley so had to place mittens on. Patient was able to sleep over 4 hrs and appeared very comfortable. Once he was awake he started pulling and yelling again. Bladder irrigation has been running at minimum however urine continues to be blood tingued however not many clots seen.

## 2017-08-26 DIAGNOSIS — D4622 Refractory anemia with excess of blasts 2: Secondary | ICD-10-CM

## 2017-08-26 DIAGNOSIS — B9620 Unspecified Escherichia coli [E. coli] as the cause of diseases classified elsewhere: Secondary | ICD-10-CM

## 2017-08-26 DIAGNOSIS — Z7189 Other specified counseling: Secondary | ICD-10-CM

## 2017-08-26 DIAGNOSIS — Z515 Encounter for palliative care: Secondary | ICD-10-CM

## 2017-08-26 DIAGNOSIS — N390 Urinary tract infection, site not specified: Secondary | ICD-10-CM

## 2017-08-26 MED ORDER — LIP MEDEX EX OINT
TOPICAL_OINTMENT | CUTANEOUS | Status: DC | PRN
  Administered 2017-08-26: 1 via TOPICAL
  Filled 2017-08-26: qty 7

## 2017-08-26 MED ORDER — CEPHALEXIN 500 MG PO CAPS
500.0000 mg | ORAL_CAPSULE | Freq: Two times a day (BID) | ORAL | Status: DC
  Administered 2017-08-26 – 2017-08-27 (×3): 500 mg via ORAL
  Filled 2017-08-26 (×3): qty 1

## 2017-08-26 NOTE — Progress Notes (Signed)
. .    HEMATOLOGY/ONCOLOGY INPATIENT PROGRESS NOTE  Date of Service: 08/26/2017   Patient Care Team: [name], MD as PCP - General (Internal Medicine) Norma Fredrickson, MD as Consulting Physician (Psychiatry)  CHIEF COMPLAINTS/PURPOSE OF CONSULTATION:  F/u for MDS/RAEB-2 Goals of care discussion   HISTORY OF PRESENTING ILLNESS:   Jordan Terry is a wonderful 83 y.o. male who has been referred to [place] by Dr .[name], MD/ Dr Harriett Sine MD for evaluation and management of pancytopenia.  Patient has a history of prostate cancer status post brachi radiation in 2002, Grover's disease (was being treated at [facility] dermatology and had been on methotrexate for 5 years, recently discontinued 2 months ago), anxiety, vertigo, hearing difficulty.  He recently transferred his dermatology care to Dr. Elvera Lennox in [place] and had recent labs on 03/29/2017 which showed pancytopenia with a hemoglobin of 10.7 with an MCV of 82.5, platelet count of 45k , WBC count of 2.1k with an [place] of 924.  He reports that he has been off methotrexate for 2 months. Notes no other new medications. No recent symptoms suggestive of a viral infection. No overt fevers chills or night sweats. Does note some fatigue. Denies any previous history of liver disease. No recent infections requiring antibiotics. No new focal bone pain.   INTERVAL HISTORY   Jordan Terry is being seen as in-patient today. He was admitted on 08/22/2017 for hematuria. He has finished 4 cycles of Vidaza. He received a blood transfusion on 08/23/2017 Hgb is 9.6 as of of 08/25/2017. Platelets were borderline low but received platelet transfusion in the setting of hematuria. Platelet counts today are stable at 38k. She is WBC count has been a little higher at 3.1k with ANC a couple of days ago 1.2k which is higher than his baseline likely reactive to urinary tract infection and stress. His hematuria is resolving. Has an indwelling  catheter at this time and was needing CBI yesterday. Appreciate urology input.  He was moved out of ICU today. He still has catheter in place and he reports still seeing some bleeding. He is being treated with antibiotics for possible urinary infection.  I was called and discussed his current situation and goals of care with his Daughter Jordan Terry with his permission. Daughter and his wife are concerned about being able to take care of him at home. He apparently was fairly confused prior to admission and had messed. We discussed that this could be delirium in the setting of his UTI and hematuria. Past minimal confusion today but seems fairly oriented overall. They wanted to have a sense of his prognosis from me which was discussed in detail. We re-discussed some of the elements from our previous clinic goals of care discussion. On review of systems, pt reports some bleeding in catheter, and denies any new acute symptoms.    MEDICAL HISTORY:  Past Medical History:  Diagnosis Date  . Anginal pain ([place]) 20 years ago  . Anxiety   . Arthritis   . Bilateral tinnitus    wears hearing aides  . Cancer [facility])    prostate  . Grover's disease   . Headache(784.0)    not since retired  . Hematuria 04/14/2017  . Pancytopenia ([place]) 04/19/2017  . Self-catheterizes urinary bladder 04/19/2017  . Vertigo    SURGICAL HISTORY: Past Surgical History:  Procedure Laterality Date  . CARDIAC CATHETERIZATION  "20 years ago"  . CARPAL TUNNEL RELEASE Left 2008  . CATARACT EXTRACTION Bilateral 2014  .  CYSTOSCOPY WITH URETHRAL DILATATION N/A 08/14/2013   Procedure: CYSTOSCOPY WITH URETHRAL DILATATION BALLOON DILATION OF URETHRAL STRICTURE;  Surgeon: Dutch Gray, MD;  Location: [facility]S;  Service: Urology;  Laterality: N/A;  BALLOON DILATION   . CYSTOSCOPY WITH URETHRAL DILATATION N/A 05/07/2014   Procedure: CYSTOSCOPY WITH BALLOON DILATION OF URETHRAL STRICTURE;  Surgeon: Raynelle Bring, MD;  Location: [facility];  Service:  Urology;  Laterality: N/A;  [place]   "scope"  . NOSE SURGERY  1991, 1998  . RADIOACTIVE SEED IMPLANT  2002   "for prostate cancer"  . SHOULDER SURGERY  2012   right  . THYROIDECTOMY, PARTIAL  1974  . VASECTOMY  1970    SOCIAL HISTORY: Social History   Social History  . Marital status: Married    Spouse name: Jordan Terry  . Number of children: 3  . Years of education: college   Occupational History  . Not on file.   Social History Main Topics  . Smoking status: Former Smoker    Years: 10.00    Types: Cigars    Quit date: 1979  . Smokeless tobacco: Never Used  . Alcohol use No  . Drug use: No  . Sexual activity: Not on file   Other Topics Concern  . Not on file   Social History Narrative   Patient is married Jordan Terry) and lives at home with his wife.   Patient has three children.   Patient is retired.   Patient has a college education in [place].   Patient is right-handed.   Patient drinks one cup of coffee daily and one 8 oz of tea daily.    FAMILY HISTORY: Family History  Problem Relation Age of Onset  . Anxiety disorder Mother   . Cancer Father        colon, lung  . Diabetes Brother   . Cancer Brother   . Diabetes Sister   . Leukemia Sister   . Cancer Sister        breast  . Cancer Sister        breast  . Cancer Sister        thyroid  . Diabetes Sister     ALLERGIES:  is allergic to benadryl [diphenhydramine hcl] and hydrocodone.  MEDICATIONS:  Current Facility-Administered Medications  Medication Dose Route Frequency Provider Last Rate Last Dose  . 0.9 %  sodium chloride infusion   Intravenous Once Isla Pence, MD      . acyclovir (ZOVIRAX) tablet 400 mg  400 mg Oral Daily Elwin Mocha, MD   400 mg at 08/26/17 0940  . cephALEXin (KEFLEX) capsule 500 mg  500 mg Oral Q12H Rosita Fire, MD   500 mg at 08/26/17 1308  . fluconazole (DIFLUCAN) tablet 200 mg  200 mg Oral Daily  Elwin Mocha, MD   200 mg at 08/26/17 0940  . gabapentin (NEURONTIN) capsule 200 mg  200 mg Oral TID Elwin Mocha, MD   200 mg at 08/26/17 1610  . haloperidol lactate (HALDOL) injection 2.5 mg  2.5 mg Intravenous Q6H PRN Gardiner Barefoot, NP   2.5 mg at 08/26/17 0518  . HYDROmorphone (DILAUDID) injection 0.5 mg  0.5 mg Intravenous Q2H PRN Elwin Mocha, MD   0.5 mg at 08/23/17 1957  . lip balm (CARMEX) ointment   Topical PRN Rosita Fire, MD   1 application at 96/04/54 1308  . LORazepam (ATIVAN)  tablet 1 mg  1 mg Oral QHS Elwin Mocha, MD   1 mg at 08/25/17 2102  . MEDLINE mouth rinse  15 mL Mouth Rinse BID Elwin Mocha, MD   15 mL at 08/26/17 0941  . nortriptyline (PAMELOR) capsule 25 mg  25 mg Oral QHS Elwin Mocha, MD   25 mg at 08/25/17 2211  . ondansetron (ZOFRAN) tablet 4 mg  4 mg Oral Q6H PRN Elwin Mocha, MD       Or  . ondansetron [facility]) injection 4 mg  4 mg Intravenous Q6H PRN Elwin Mocha, MD      . oxybutynin [facility]) tablet 5 mg  5 mg Oral TID Elwin Mocha, MD   5 mg at 08/26/17 0939  . oxyCODONE-acetaminophen (PERCOCET) 7.5-325 MG per tablet 0.5-1 tablet  0.5-1 tablet Oral Q8H PRN Elodia Florence., MD   1 tablet at 08/26/17 1133  . QUEtiapine (SEROQUEL) tablet 25 mg  25 mg Oral QHS Elodia Florence., MD   25 mg at 08/25/17 2102  . triamcinolone cream (KENALOG) 0.1 % 1 application  1 application Topical q morning - 10a Elwin Mocha, MD   1 application at 03/50/09 1100    REVIEW OF SYSTEMS:    10 Point review of Systems was done is negative except as noted above.  PHYSICAL EXAMINATION: ECOG PERFORMANCE STATUS: 2 - Symptomatic, <50% confined to bed  . Vitals:   08/26/17 1137 08/26/17 1200  BP:  115/72  Pulse: (!) 137   Resp:  20  Temp:  98.1 F (36.7 C)  SpO2:     Filed Weights   08/22/17 2136 08/23/17 0500 08/26/17 0500  Weight: 156 lb 8.4 oz (71 kg) 156 lb 1.4 oz (70.8 kg) 153 lb 10.2 oz (69.7 kg)    .Body mass index is 19.73 kg/m.  GENERAL:alert, is no acute distress and comfortable SKIN: no acute rashes, Noted to have petechiae on his lower extremities. Has cyst on upper right shoulder EYES: conjunctiva are pink and non-injected, sclera anicteric OROPHARYNX: MMM, no exudates, no oropharyngeal erythema or ulceration NECK: supple, no JVD LYMPH:  no palpable lymphadenopathy in the cervical, axillary or inguinal regions LUNGS: clear to auscultation b/l with normal respiratory effort HEART: regular rate & rhythm  ABDOMEN:  normoactive bowel sounds , non tender, not distended. Borderline palpable spleen no overt palpable hepatomegaly. Extremity: no pedal edema PSYCH: alert & oriented x 3 with fluent speech NEURO: no focal motor/sensory deficits  LABORATORY DATA:  I have reviewed the data as listed  .Marland Kitchen CBC Latest Ref Rng & Units 08/25/2017 08/24/2017 08/23/2017  WBC 4.0 - 10.5 K/uL 3.1(L) 2.6(L) 2.1(L)  Hemoglobin 13.0 - 17.0 g/dL 9.6(L) 9.3(L) 9.3(L)  Hematocrit 39.0 - 52.0 % 27.9(L) 27.3(L) 28.1(L)  Platelets 150 - 400 K/uL 38(L) 39(L) 35(L)   ANC 400 . CBC    Component Value Date/Time   WBC 3.1 (L) 08/25/2017 0339   RBC 3.21 (L) 08/25/2017 0339   HGB 9.6 (L) 08/25/2017 0339   HGB 7.0 (L) 08/20/2017 1159   HCT 27.9 (L) 08/25/2017 0339   HCT 21.9 (L) 08/20/2017 1159   PLT 38 (L) 08/25/2017 0339   PLT 26 (L) 08/20/2017 1159   MCV 86.9 08/25/2017 0339   MCV 90.1 08/20/2017 1159   MCH 29.9 08/25/2017 0339   MCHC 34.4 08/25/2017 0339   RDW 17.7 (H) 08/25/2017 0339   RDW 21.5 (H) 08/20/2017 1159   LYMPHSABS 0.8 08/22/2017 1230  LYMPHSABS 0.9 08/20/2017 1159   MONOABS 0.2 08/22/2017 1230   MONOABS 0.1 08/20/2017 1159   EOSABS 0.0 08/22/2017 1230   EOSABS 0.0 08/20/2017 1159   BASOSABS 0.0 08/22/2017 1230   BASOSABS 0.0 08/20/2017 1159    . CMP Latest Ref Rng & Units 08/25/2017 08/24/2017 08/23/2017  Glucose 65 - 99 mg/dL 135(H) 118(H) 107(H)  BUN 6 - 20 mg/dL  23(H) 19 19  Creatinine 0.61 - 1.24 mg/dL 1.10 1.12 1.12  Sodium 135 - 145 mmol/L 139 136 138  Potassium 3.5 - 5.1 mmol/L 3.6 3.4(L) 3.6  Chloride 101 - 111 mmol/L 105 100(L) 102  CO2 22 - 32 mmol/L 22 26 25   Calcium 8.9 - 10.3 mg/dL 9.0 8.5(L) 8.8(L)  Total Protein 6.4 - 8.3 g/dL - - -  Total Bilirubin 0.20 - 1.20 mg/dL - - -  Alkaline Phos 40 - 150 U/L - - -  AST 5 - 34 U/L - - -  ALT 0 - 55 U/L - - -         RADIOGRAPHIC STUDIES: I have personally reviewed the radiological images as listed and agreed with the findings in the report. Dg Abd Acute W/chest  Result Date: 08/21/2017 CLINICAL DATA:  Abdominal pain. EXAM: DG ABDOMEN ACUTE W/ 1V CHEST COMPARISON:  June 22, 2017. FINDINGS: There is no evidence of dilated bowel loops or free intraperitoneal air. No radiopaque calculi or other significant radiographic abnormality is seen. Unchanged prostatic seed implants. Degenerative changes of the lumbar spine. Heart size and mediastinal contours are within normal limits. Both lungs are clear. IMPRESSION: Negative abdominal radiographs.  No acute cardiopulmonary disease. Electronically Signed   By: Titus Dubin M.D.   On: 08/21/2017 18:03     CT chest/abd/pelvis 04/20/2017 IMPRESSION: Diffuse cystitis involving the urinary bladder. Foley catheter in place. No evidence of lymphadenopathy or metastatic disease within the chest, abdomen, or pelvis. 2.6 cm left thyroid lobe nodule. Consider thyroid ultrasound for further evaluation. This recommendation follows ACR consensus guidelines: Managing Incidental Thyroid Nodules Detected on Imaging: White Paper of the ACR Incidental Thyroid Findings Committee. J Am Coll Radiol 2015;12(2):143-150. Colonic diverticulosis. No radiographic evidence of diverticulitis. Aortic atherosclerosis. Electronically Signed   By: Earle Gell M.D.   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 HIV nonreactive Hepatitis C negative LDH within normal limits at 191 S/p Vidaza - dose reduced x 4 cycles  #2 thrombocytopenia platelet count down from 30K to 19K. Now upto 38k with platelet transfusion in the setting of hematuria.  #3 Neutropenia, WBC improved to 3.1 with an ANC of 1.2k. Above his baseline likely due to infection and stresses of hematuria. -continue Acyclovir and fluconazole for prophylaxis. -on antibiotics to treat urinary tract infection.  #4 normocytic anemia hemoglobin of 9.6 (s/p PRBC transfusion for anemia from hematuria and baseline MDS)  #5 Resolving Delirium - likely from hospitalization plus UTI plus hematuria. He does have significant anxiety at baseline which is being managed by psychiatry. This does have some bearing on his functional status as well.  PLAN -I discussed with the patient and then with his permission with his daughter gone up over the phone his current medical status. We discussed goals of care and his daughter's concern of him not able to function independently at home and the fact that taking care of him  has been quite stressful to her mother/his wife. We discussed that this could have been from acute delirium in the setting of infection and hematuria though he might have increasing care needs in spite of this improving. -They hope to have him transferred to a skilled nursing facility on discharge. -we discussed that at this point his blood counts from an MDS perspective have been stable with no evidence of acute leukemic transformation. Typically need atleast 6 cycles of treatment to see if there is adequate response to continue. At this point he has had stable disease without significant deterioration or worsening with his 4 cycles of treatment. Ongoing treatment depends on the patient's choice. There is burden  of treatment 5-7 days per months. He has not had any prohibitive toxicities from treatment at this time. -We discussed that continued treatment with Vidaza and supportive transfusions would be one approach but involves a higher burden of care. If his performance status continued to worsen of if he with his family choose to hold of on further Vidaza/Supportive transfusions and pursue best supportive cares through hospice - that would not be an unreasonable choice as well. --transfuse plt prn for PLT<20k or if bleeding -transfuse prbc prn for hgb <8 or if symptomatic (CMV neg irradiated blood products) -continue fluconazole and acyclovir for prophylaxis. -on antibiotics to treat urinary tract infection. -Management of hematuria as per urology.   All of the patients questions were answered with apparent satisfaction. The patient knows to call the clinic with any problems, questions or concerns.  I spent 25 minutes counseling the patient face to face. The total time spent in the appointment was 35 minutes and more than 50% was on counseling and direct patient cares and co-ordination of cares.    Sullivan Lone MD [place] AAHIVMS [facility] Hematology/Oncology Physician [facility]  (Office):       [phone] (Work cell):  [phone] (Fax):           [phone]  This document serves as a record of services personally performed by Sullivan Lone, MD. It was created on her behalf by Alean Rinne, a trained medical scribe. The creation of this record is based on the scribe's personal observations and the provider's statements to them. This document has been checked and approved by the attending provider.

## 2017-08-26 NOTE — Consult Note (Signed)
Consultation Note Date: 08/26/2017   Patient Name: Jordan Terry  DOB: [birth year]  MRN: [id number]  Age / Sex: 83 y.o., male  PCP: [name], MD Referring Physician: Rosita Fire, MD  Reason for Consultation: Establishing goals of care  HPI/Patient Profile: 83 y.o. male  with past medical history of prostate cancer, Grovers disease, MDS, pancytopenia,and anxiety admitted for hematuria on 08/22/2017     Clinical Assessment and Goals of Care:  The patient is admitted to the hospital medicine service with hematuria, urinary retention, E coli UTI, acute delirium, also with MDS with pancytopenia.   A palliative consult has been requested for goals of care discussions.   The patient is an elderly gentleman resting in his chair, he is awake, tracks me in the room, is some what confused, he thinks he has met me and my sister earlier some where, he how ever, is able to respond to most questions quite appropriately, he states that his wife and daughter have gone home to rest.   I introduced myself and palliative care as follows: Palliative medicine is specialized medical care for people living with serious illness. It focuses on providing relief from the symptoms and stress of a serious illness. The goal is to improve quality of life for both the patient and the family.  I asked Mr Longenecker what brought him to the hospital, he says that his platelets were running low, he was here to get some platelets, then he ended up receiving blood. When asked what is important about his health that I should be aware of, he says he has cancer.   We reviewed this current hospitalization, we tried to discuss goals, wishes and values important to the patient.   He is aware of the possibility of going to a SNF for rehab, he how ever is keen on going back home in [place], [place] after that.   See recommendations  below, will attempt to contact family for additional discussions. Thank you for the consult.   NEXT OF KIN  wife, 3 children, who live locally.   SUMMARY OF RECOMMENDATIONS    Agree with med onc input, regarding further treatment options for his cancer, given ongoing decline and debility, current hospitalization for hematuria, UTI etc.  Recommend SNF rehab with palliative care to follow on discharge.  Full code for now, will discuss further when family is present.  Thank you for the consult.  Code Status/Advance Care Planning:  Full code    Symptom Management:      Palliative Prophylaxis:   Bowel Regimen     Psycho-social/Spiritual:   Desire for further Chaplaincy support:yes  Additional Recommendations: Caregiving  Support/Resources  Prognosis:   Unable to determine  Discharge Planning: [place] for rehab with Palliative care service follow-up      Primary Diagnoses: Present on Admission: . Hematuria . Myelodysplastic syndrome ([place])   I have reviewed the medical record, interviewed the patient and family, and examined the patient. The following aspects are pertinent.  Past Medical  History:  Diagnosis Date  . Anginal pain ([place]) 20 years ago  . Anxiety   . Arthritis   . Bilateral tinnitus    wears hearing aides  . Cancer [facility])    prostate  . Grover's disease   . Headache(784.0)    not since retired  . Hematuria 04/14/2017  . Pancytopenia ([place]) 04/19/2017  . Self-catheterizes urinary bladder 04/19/2017  . Vertigo    Social History   Social History  . Marital status: Married    Spouse name: [name]  . Number of children: 3  . Years of education: college   Social History Main Topics  . Smoking status: Former Smoker    Years: 10.00    Types: Cigars    Quit date: 1979  . Smokeless tobacco: Never Used  . Alcohol use No  . Drug use: No  . Sexual activity: Not Asked   Other Topics Concern  . 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   Patient has three children.   Patient is retired.   Patient has a college education in [place].   Patient is right-handed.   Patient drinks one cup of coffee daily and one 8 oz of tea daily.   Family History  Problem Relation Age of Onset  . Anxiety disorder Mother   . Cancer Father        colon, lung  . Diabetes Brother   . Cancer Brother   . Diabetes Sister   . Leukemia Sister   . Cancer Sister        breast  . Cancer Sister        breast  . Cancer Sister        thyroid  . Diabetes Sister    Scheduled Meds: . acyclovir  400 mg Oral Daily  . cephALEXin  500 mg Oral Q12H  . fluconazole  200 mg Oral Daily  . gabapentin  200 mg Oral TID  . LORazepam  1 mg Oral QHS  . mouth rinse  15 mL Mouth Rinse BID  . nortriptyline  25 mg Oral QHS  . oxybutynin  5 mg Oral TID  . QUEtiapine  25 mg Oral QHS  . triamcinolone cream  1 application Topical q morning - 10a   Continuous Infusions: . sodium chloride     PRN Meds:.haloperidol lactate, HYDROmorphone (DILAUDID) injection, lip balm, ondansetron **OR** ondansetron (ZOFRAN) IV, oxyCODONE-acetaminophen Medications Prior to Admission:  Prior to Admission medications   Medication Sig Start Date End Date Taking? Authorizing Provider  acyclovir (ZOVIRAX) 400 MG tablet Take 1 tablet (400 mg total) by mouth daily. 08/10/17  Yes Brunetta Genera, MD  fluconazole (DIFLUCAN) 200 MG tablet Take 1 tablet (200 mg total) by mouth daily. 08/10/17  Yes Brunetta Genera, MD  gabapentin (NEURONTIN) 100 MG capsule Take 200 mg by mouth 3 (three) times daily.    Yes [provider]  LORazepam (ATIVAN) 0.5 MG tablet Take 1 mg by mouth at bedtime.   Yes [provider]  nortriptyline (PAMELOR) 25 MG capsule Take 1 capsule (25 mg total) by mouth at bedtime. 01/19/17  Yes Dennie Bible, NP  ondansetron (ZOFRAN) 8 MG tablet Take 1 tablet (8 mg total) by mouth 2  (two) times daily as needed (Nausea or vomiting). 05/17/17  Yes Brunetta Genera, MD  oxybutynin (DITROPAN) 5 MG tablet Take 5 mg by mouth 3 (three) times daily.  08/21/17  Yes [provider]  oxyCODONE-acetaminophen (PERCOCET) 7.5-325 MG per tablet Take 0.5-1 tablets by mouth every 8 (eight) hours as needed (pain.).    Yes [provider]  polyethylene glycol (MIRALAX / GLYCOLAX) packet Take 17 g by mouth 2 (two) times daily. 07/14/17  Yes Brunetta Genera, MD  prochlorperazine (COMPAZINE) 10 MG tablet Take 1 tablet (10 mg total) by mouth every 6 (six) hours as needed (Nausea or vomiting). 05/17/17  Yes Brunetta Genera, MD  senna-docusate (SENOKOT-S) 8.6-50 MG tablet Take 2 tablets by mouth 2 (two) times daily. 06/23/17  Yes Dessa Phi Chahn-Yang, DO  temazepam (RESTORIL) 15 MG capsule Take 15 mg by mouth at bedtime.   Yes [provider]  triamcinolone cream (KENALOG) 0.1 % Apply 1 application topically every morning. APPLY TO CHEST AND BACK EVERY MORNING FOR [place] DISEASE   Yes [provider]   Allergies  Allergen Reactions  . Benadryl [Diphenhydramine Hcl] Anxiety    MAKES HIM GO CRAZY  . Hydrocodone Itching    Small amounts are okay   Review of Systems Confused Denies pain  Physical Exam Elderly gentleman resting in bed Confused, is how ever able to answer yes/no type questions appropriately Dark urine in foley bag Awake Regular Abdomen soft Trace edema Appears pale, weak, chronically ill, with muscle wasting evident.   Vital Signs: BP 117/68 (BP Location: Left Arm)   Pulse 88   Temp 98.9 F (37.2 C) (Oral)   Resp 17   Ht '6\' 2"'  (1.88 m)   Wt 69.7 kg (153 lb 10.2 oz)   SpO2 97%   BMI 19.73 kg/m  Pain Assessment: 0-10   Pain Score: Asleep   SpO2: SpO2: 97 % O2 Device:SpO2: 97 % O2 Flow Rate: .   IO: Intake/output summary:  Intake/Output Summary (Last 24 hours) at 08/26/17 1556 Last data filed at 08/26/17 0820   Gross per 24 hour  Intake              170 ml  Output             4425 ml  Net            -4255 ml    LBM: Last BM Date: 08/26/17 Baseline Weight: Weight: 71 kg (156 lb 8.4 oz) Most recent weight: Weight: 69.7 kg (153 lb 10.2 oz)     Palliative Assessment/Data:   PPS 40%  Time In:  1500 Time Out:  1600 Time Total:  60 min  Greater than 50%  of this time was spent counseling and coordinating care related to the above assessment and plan.  Signed by: Loistine Chance, MD  [phone]  Please contact Palliative Medicine Team phone at [phone] for questions and concerns.  For individual provider: See Shea Evans

## 2017-08-26 NOTE — Clinical Social Work Placement (Signed)
   CLINICAL SOCIAL WORK PLACEMENT  NOTE  Date:  08/26/2017  Patient Details  Name: Jordan Terry MRN: [id number] Date of Birth: [birth year]  Clinical Social Work is seeking post-discharge placement for this patient at the [place] level of care (*CSW will initial, date and re-position this form in  chart as items are completed):  Yes   Patient/family provided with [place] Work Department's list of facilities offering this level of care within the geographic area requested by the patient (or if unable, by the patient's family).  Yes   Patient/family informed of their freedom to choose among providers that offer the needed level of care, that participate in Medicare, Medicaid or managed care program needed by the patient, have an available bed and are willing to accept the patient.  Yes   Patient/family informed of [facility]'s ownership interest in [facility] and [facility], as well as of the fact that they are under no obligation to receive care at these facilities.  PASRR submitted to EDS on 08/26/17     PASRR number received on       Existing PASRR number confirmed on       FL2 transmitted to all facilities in geographic area requested by pt/family on 08/26/17     FL2 transmitted to all facilities within larger geographic area on       Patient informed that his/her managed care company has contracts with or will negotiate with certain facilities, including the following:            Patient/family informed of bed offers received.  Patient chooses bed at       Physician recommends and patient chooses bed at      Patient to be transferred to   on  .  Patient to be transferred to facility by       Patient family notified on   of transfer.  Name of family member notified:        PHYSICIAN Please sign FL2     Additional Comment:    _______________________________________________ Lilly Cove, LCSW 08/26/2017, 2:05  PM

## 2017-08-26 NOTE — NC FL2 (Signed)
[place] LEVEL OF CARE SCREENING TOOL     IDENTIFICATION  Patient Name: Jordan Terry Birthdate: May 05, 1932 Sex: male Admission Date (Current Location): 08/22/2017  [facility] and [place] Number:  Herbalist and Address:  [facility],  [place] [address], [place]      Provider Number: [id number]  Attending Physician Name and Address:  Rosita Fire, MD  Relative Name and Phone Number:       Current Level of Care: Hospital Recommended Level of Care: [place] Prior Approval Number:    Date Approved/Denied:   PASRR Number:    Discharge Plan: SNF    Current Diagnoses: Patient Active Problem List   Diagnosis Date Noted  . E. coli UTI   . Obstruction of Foley catheter ([place])   . Acute blood loss anemia   . Counseling regarding advanced care planning and goals of care 08/05/2017  . Anemia 06/22/2017  . Constipation 06/22/2017  . Myelodysplastic syndrome ([place]) 05/07/2017  . Gross hematuria 04/26/2017  . 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 Dyspnea 04/26/2017  . NHL (non-Hodgkin's lymphoma) ([place])   . Urethral bleeding 04/19/2017  . Pancytopenia ([place]) 04/19/2017  . Sepsis ([place]) 03/09/2016  . Prostate cancer ([place]) 03/08/2016  . Hematuria 03/08/2016  . Fever and chills 03/08/2016  . Fever 03/08/2016  . Abnormality of gait 05/31/2013  . Dizziness and giddiness 05/31/2013    Orientation RESPIRATION BLADDER Height & Weight     Self  Normal Incontinent Weight: 153 lb 10.2 oz (69.7 kg) Height:  6\' 2"  (188 cm)  BEHAVIORAL SYMPTOMS/MOOD NEUROLOGICAL BOWEL NUTRITION STATUS      Continent Diet (See DC summary)  AMBULATORY STATUS COMMUNICATION OF NEEDS Skin   Limited Assist Verbally    Normal                     Personal Care Assistance Level of Assistance  Bathing, Feeding, Dressing Bathing Assistance: Maximum assistance Feeding assistance: Limited assistance Dressing Assistance: Limited assistance      Functional Limitations Info  Sight, Hearing, Speech Sight Info: Adequate Hearing Info: Impaired Speech Info: Adequate    SPECIAL CARE FACTORS FREQUENCY  PT (By licensed PT), OT (By licensed OT)     PT Frequency: 5x a week OT Frequency: 5x a week            Contractures Contractures Info: Not present    Additional Factors Info  Code Status, Allergies Code Status Info: Full Code Allergies Info: Benadryl Diphenhydramine Hcl, Hydrocodone           Current Medications (08/26/2017):  This is the current hospital active medication list Current Facility-Administered Medications  Medication Dose Route Frequency Provider Last Rate Last Dose  . 0.9 %  sodium chloride infusion   Intravenous Once Isla Pence, MD      . acyclovir (ZOVIRAX) tablet 400 mg  400 mg Oral Daily Elwin Mocha, MD   400 mg at 08/26/17 0940  . cephALEXin (KEFLEX) capsule 500 mg  500 mg Oral Q12H Rosita Fire, MD   500 mg at 08/26/17 1308  . fluconazole (DIFLUCAN) tablet 200 mg  200 mg Oral Daily Elwin Mocha, MD   200 mg at 08/26/17 0940  . gabapentin (NEURONTIN) capsule 200 mg  200 mg Oral TID Elwin Mocha, MD   200 mg at 08/26/17 4540  . haloperidol lactate (HALDOL) injection 2.5 mg  2.5 mg Intravenous Q6H PRN Gardiner Barefoot, NP  2.5 mg at 08/26/17 0518  . HYDROmorphone (DILAUDID) injection 0.5 mg  0.5 mg Intravenous Q2H PRN Elwin Mocha, MD   0.5 mg at 08/23/17 1957  . lip balm (CARMEX) ointment   Topical PRN Rosita Fire, MD   1 application at 82/95/62 1308  . LORazepam (ATIVAN) tablet 1 mg  1 mg Oral QHS Elwin Mocha, MD   1 mg at 08/25/17 2102  . MEDLINE mouth rinse  15 mL Mouth Rinse BID Elwin Mocha, MD   15 mL at 08/26/17 0941  . nortriptyline (PAMELOR) capsule 25 mg  25 mg Oral QHS Elwin Mocha, MD   25 mg at 08/25/17 2211  . ondansetron (ZOFRAN) tablet 4 mg  4 mg Oral Q6H PRN Elwin Mocha, MD       Or  . ondansetron [facility]) injection 4  mg  4 mg Intravenous Q6H PRN Elwin Mocha, MD      . oxybutynin [facility]) tablet 5 mg  5 mg Oral TID Elwin Mocha, MD   5 mg at 08/26/17 0939  . oxyCODONE-acetaminophen (PERCOCET) 7.5-325 MG per tablet 0.5-1 tablet  0.5-1 tablet Oral Q8H PRN Elodia Florence., MD   1 tablet at 08/26/17 1133  . QUEtiapine (SEROQUEL) tablet 25 mg  25 mg Oral QHS Elodia Florence., MD   25 mg at 08/25/17 2102  . triamcinolone cream (KENALOG) 0.1 % 1 application  1 application Topical q morning - 10a Elwin Mocha, MD   1 application at 13/08/65 1100     Discharge Medications: Please see discharge summary for a list of discharge medications.  Relevant Imaging Results:  Relevant Lab Results:   Additional Information SSN:  [id number]     Lilly Cove, [place]

## 2017-08-26 NOTE — Progress Notes (Signed)
LCSW following for disposition: TBD  LCSW met with patient wife and daughter at length this afternoon to discuss disposition, recommendations and role of LCSW during the discharge process  Daughter and wife very amendable to services and they both agree for SNF placement, however feel patient may not be agreeable even though patient may not have a choice as wife cannot manage patient at home.  Wife and daughter agreeable and interested in [facility]. Patient seen at bedside and he was still confused, but able to engage and reports " I am not doing well and I don't want to be rude to you", thus discussion was moved to waiting room.  Family also discussed possibility to have [place] meeting as they are also interested in hospice and also if Chemotherapy is still needing to be pursued or if they should stop chemotherapy as wife feels it has not been effective and hurting patient.   LCSW obtained permission from family to contact and request a palliative care team consult to define goals of care and also requested that MD consult oncologist as family has questions regarding treatment plan.  Family has given permission to send referrals for SNF and work to obtain insurance authorization.  Patient requires auth prior to admitting to SNF> LCSW educated family that patient must participate and engage in effort for insurance to continue paying. Patient also has a long term care policy he could utilize if warranted.  Plan:  [place] ordered Oncologist paged per attending MD whom LCSW discussed assessment plans.  LCSW has completed SNF work up and faxed referrals.  LCSW also begin insurance authorization.  Per family patient to move out of ICU when bed available on med surg.  Lane Hacker, MSW Clinical Social Work: [employer] Coverage for :  [phone]

## 2017-08-26 NOTE — Progress Notes (Signed)
Patient ID: Jordan Terry, male   DOB: 1932-04-06, 83 y.o.   MRN: [id number]    Subjective: Pt less confused this morning.  I irrigated some clots last night and restarted slow CBI drip.  Objective: Vital signs in last 24 hours: Temp:  [97.6 F (36.4 C)-99.7 F (37.6 C)] 98.8 F (37.1 C) (11/01 0400) Pulse Rate:  [56-105] 87 (11/01 0800) Resp:  [13-27] 19 (11/01 0800) BP: (104-137)/(48-77) 124/75 (11/01 0800) SpO2:  [94 %-99 %] 95 % (11/01 0800) Weight:  [69.7 kg (153 lb 10.2 oz)] 69.7 kg (153 lb 10.2 oz) (11/01 0500)  Intake/Output from previous day: 10/31 0701 - 11/01 0700 In: 170 [P.O.:120; IV Piggyback:50] Out: 4431 [Urine:3575] Intake/Output this shift: Total I/O In: 50 [Other:50] Out: -   Physical Exam:  General: Alert and oriented GU: Urine clear on minimal CBI drip.  Lab Results:  Recent Labs  08/24/17 0334 08/25/17 0339  HGB 9.3* 9.6*  HCT 27.3* 27.9*   CBC Latest Ref Rng & Units 08/25/2017 08/24/2017 08/23/2017  WBC 4.0 - 10.5 K/uL 3.1(L) 2.6(L) 2.1(L)  Hemoglobin 13.0 - 17.0 g/dL 9.6(L) 9.3(L) 9.3(L)  Hematocrit 39.0 - 52.0 % 27.9(L) 27.3(L) 28.1(L)  Platelets 150 - 400 K/uL 38(L) 39(L) 35(L)      BMET  Recent Labs  08/24/17 0334 08/25/17 0339  NA 136 139  K 3.4* 3.6  CL 100* 105  CO2 26 22  GLUCOSE 118* 135*  BUN 19 23*  CREATININE 1.12 1.10  CALCIUM 8.5* 9.0     Studies/Results: No results found.  Assessment/Plan: Hematuria: Improved.  Will again hold CBI today. UTI: Continue culture specific antibiotics for total of 1 week.   LOS: 4 days   Colter Magowan,LES 08/26/2017, 8:34 AM

## 2017-08-26 NOTE — Progress Notes (Signed)
08/26/17  1447  Patient has left hearing aid in ear and right hearing aid in the pink denture container at bedside.

## 2017-08-26 NOTE — Progress Notes (Signed)
Physical Therapy Treatment Patient Details Name: Jordan Terry MRN: [id number] DOB: 1932-07-21 Today's Date: 08/26/2017    History of Present Illness Pt was admitted with hematuria. PMH:  prostate CA, vertigo, Grover's disease and anxiety    PT Comments    Pt tolerated increased ambulation distance of 200' with RW. He requires ongoing verbal cues for safe use of RW.  HR up to 137 while straining to have BM, HR 117 walking.   Follow Up Recommendations  SNF     Equipment Recommendations  None recommended by PT    Recommendations for Other Services OT consult     Precautions / Restrictions Precautions Precautions: Fall Restrictions Weight Bearing Restrictions: No    Mobility  Bed Mobility               General bed mobility comments: up in chair  Transfers Overall transfer level: Needs assistance Equipment used: Rolling walker (2 wheeled) Transfers: Sit to/from Stand Sit to Stand: Min assist;+2 safety/equipment         General transfer comment: assist to rise and steady, verbal and manual cues for hand placement; sit to stand x 2 (from recliner and then from 3 in 1); HR up to 137 while straining to have BM -RN notified  Ambulation/Gait Ambulation/Gait assistance: Min assist;+2 safety/equipment Ambulation Distance (Feet): 200 Feet Assistive device: Rolling walker (2 wheeled) Gait Pattern/deviations: Step-to pattern;Step-through pattern;Decreased step length - right;Decreased step length - left;Shuffle;Trunk flexed Gait velocity: decr   General Gait Details: continuous cues for posture and position from RW (pt walks too far behind RW), despite ongoing cues, he continues to maintain flexed posture and walk too far behind RW; HR 117 walking   Stairs            Wheelchair Mobility    Modified Rankin (Stroke Patients Only)       Balance Overall balance assessment: Needs assistance Sitting-balance support: No upper extremity supported;Feet  supported Sitting balance-Leahy Scale: Fair     Standing balance support: Bilateral upper extremity supported Standing balance-Leahy Scale: Poor                              Cognition Arousal/Alertness: Awake/alert Behavior During Therapy: WFL for tasks assessed/performed Overall Cognitive Status: No family/caregiver present to determine baseline cognitive functioning                                 General Comments: Some difficulty following commands with stepping into walking, pt HOH, stated "I'm confused"      Exercises      General Comments        Pertinent Vitals/Pain Pain Assessment: 0-10 Pain Score: 7  Pain Location: " all over" Pain Descriptors / Indicators: Sore Pain Intervention(s): Limited activity within patient's tolerance;Monitored during session;Patient requesting pain meds-RN notified    Home Living                      Prior Function            PT Goals (current goals can now be found in the care plan section) Acute Rehab PT Goals Patient Stated Goal: none stated; agreeable to walking PT Goal Formulation: Patient unable to participate in goal setting Time For Goal Achievement: 09/08/17 Potential to Achieve Goals: Good Progress towards PT goals: Progressing toward goals    Frequency    Min  3X/week      PT Plan Current plan remains appropriate    Co-evaluation              AM-PAC PT "6 Clicks" Daily Activity  Outcome Measure  Difficulty turning over in bed (including adjusting bedclothes, sheets and blankets)?: A Lot Difficulty moving from lying on back to sitting on the side of the bed? : Unable Difficulty sitting down on and standing up from a chair with arms (e.g., wheelchair, bedside commode, etc,.)?: Unable Help needed moving to and from a bed to chair (including a wheelchair)?: A Little Help needed walking in hospital room?: A Little Help needed climbing 3-5 steps with a railing? : A Lot 6  Click Score: 12    End of Session Equipment Utilized During Treatment: Gait belt Activity Tolerance: Patient tolerated treatment well Patient left: in chair;with call bell/phone within reach;with chair alarm set Nurse Communication: Mobility status PT Visit Diagnosis: Unsteadiness on feet (R26.81);Muscle weakness (generalized) (M62.81);Difficulty in walking, not elsewhere classified (R26.2)     Time: 1552-0802 PT Time Calculation (min) (ACUTE ONLY): 21 min  Charges:  $Gait Training: 8-22 mins                    G Codes:         [name] 08/26/2017, 11:43 AM [phone]

## 2017-08-26 NOTE — Progress Notes (Signed)
Date:  August 26, 2017 Chart reviewed for concurrent status and case management needs.  Will continue to follow patient progress. Being transferred from SDU to telemetry bed Discharge Planning: following for needs  Expected discharge date: [id number]  Rhonda Davis, BSN, [place], [place]

## 2017-08-26 NOTE — Clinical Social Work Note (Signed)
Clinical Social Work Assessment  Patient Details  Name: Jordan Terry MRN: [id number] Date of Birth: 1931-12-25  Date of referral:  08/26/17               Reason for consult:  Facility Placement, Discharge Planning, Emotional/Coping/Adjustment to Illness                Permission sought to share information with:  Case Manager, Customer service manager, Family Supports Permission granted to share information::  Yes, Verbal Permission Granted  Name::        Agency::     Relationship::  Wife, Ava and patient daughter  Contact Information:     Housing/Transportation Living arrangements for the past 2 months:  Single Family Home Source of Information:  Medical Team, Adult Children, Spouse Patient Interpreter Needed:  None Criminal Activity/Legal Involvement Pertinent to Current Situation/Hospitalization:  No - Comment as needed Significant Relationships:  Adult Children, Other Family Members, Spouse Lives with:  Spouse Do you feel safe going back to the place where you live?  No Need for family participation in patient care:  Yes (Comment)  Care giving concerns:  LCSW discussed with wife current care giving concerns.  Patient has been very aggressive and agitated in the ICU requiring restrains and a sitter along with medication interventions.  He is improving daily and wife reports this is not baseline.  Patient was independent and could do for himself at home where the two life.  Currently she fears she will not be able to handle him at home and feels he may not be agreeable to SNF, but will have to go because she cannot assist/supervise.  Wife reports patient is active at [facility] with chemotherapy, but is unsure if they will continue doing chemo as patient has had limited response to interventions and it is making him weaker. Wife reports he does have a history of depression and anxiety, no inpatient admissions, but his behaviors are "wild" and she hopes that if he goes to  a SNF someone could handle him. Lastly she discusses that patient and family have talked about Hospice, but she is not sure if he is ready to make this decision, but the family has discussed.  Patient may benefit from a [place] meeting or guidance from oncology and medical attending.   Social Worker assessment / plan:  Assessment completed with wife due to patient's capacity at this time.  Consult for SNF is the recommendation and wife is apprehensive and wants to discuss with patient. Wife will be arriving at the hospital after lunch with her daughter. LCSW will meet with family and patient and see if patient can engage in effort to to discuss disposition and recommendations. Patient and family live in [place], they are familiar with [facility] if he is agreeable Patient's insurance requires authorization and LCSW will submit once patient moves out of the ICU and becomes more medically stable in effort to increase chances of being approved.  Plan: TBD but working with family and patient to define. Will continue to follow.  Employment status:  Retired Nurse, adult PT Recommendations:  [place] / Referral to community resources:  [place]  Patient/Family's Response to care:  Open to SNF, but wants to involve patient if possible and discuss goals of care for his treatment  Patient/Family's Understanding of and Emotional Response to Diagnosis, Current Treatment, and Prognosis:  Wife voices concerns as patient as she describes has been "wild"  on the unit and not his typical self. She is honest with self and Probation officer that she cannot handle him like this at home and would require SNF placement if insurance authorizes and patient agrees.  Emotional Assessment Appearance:  Appears stated age Attitude/Demeanor/Rapport:  Aggressive (Verbally and/or physically), Other (confused and agitated, this is improving daily) Affect (typically  observed):  Anxious, Apprehensive Orientation:  Oriented to Self Alcohol / Substance use:  Not Applicable Psych involvement (Current and /or in the community):  No (Comment) (However on Ativan, Neurotin and Seroquel)  Discharge Needs  Concerns to be addressed:  Adjustment to Illness, Care Coordination, Discharge Planning Concerns Readmission within the last 30 days:  Yes Current discharge risk:  Cognitively Impaired, Physical Impairment Barriers to Discharge:  Ship broker, Continued Medical Work up   Lilly [place], LCSW 08/26/2017, 11:18 AM

## 2017-08-26 NOTE — Progress Notes (Addendum)
PROGRESS NOTE    Jordan Terry  PYP:[id number] DOB: 03-01-1932 DOA: 08/22/2017 PCP: [name], MD   Brief Narrative: 83 y.o.male, with past medical history of prostate cancer, Grovers disease, MDS, pancytopenia, and anxiety admitted for hematuria. Patient has received multiple units of blood and platelet transfusion. Evaluated by urologist and undergoing CBI.  Assessment & Plan:   #Hematuria/acute urinary retention: Urine is clearing. Holding CVI today by urology. Continue monitor.  #Escherichia coli, lower urinary tract infection: Patient received 4 days of IV ceftriaxone. Switched to oral Keflex for 7 days.  #Acute delirium. Improving. Continue supportive care. He was alert awake and oriented his morning.  # MDS with pancytopenia: Platelets 38 and is stable. Hemoglobin is stable. Recommended to follow up with oncologist outpatient.  Continue current medical and supportive care. PT OT recommended a skilled nursing facility. Social worker consulted. Patient is in telemetry status. D/w SW regarding dc plan. Palliative care and oncology consult requested. I discussed with Dr. Irene Limbo from oncology.  DVT prophylaxis:SCD Code Status: Full code Family Communication: No family at bedside Disposition Plan: Admitted    Consultants:   Urology  Procedures: CBI Antimicrobials: Ceftriaxone and switched to oral Keflex on November 1  Subjective: Seen and examined at bedside. Patient is alert awake and oriented. Denied headache, dizziness, nausea vomiting or chest pain.  Objective: Vitals:   08/26/17 0500 08/26/17 0745 08/26/17 0800 08/26/17 1000  BP:  (!) 113/56 124/75 129/63  Pulse:  (!) 105 87 95  Resp:  (!) 27 19 (!) 23  Temp:   97.9 F (36.6 C)   TempSrc:   Oral   SpO2:  95% 95% 94%  Weight: 69.7 kg (153 lb 10.2 oz)     Height:        Intake/Output Summary (Last 24 hours) at 08/26/17 1125 Last data filed at 08/26/17 0820  Gross per 24 hour  Intake               220 ml  Output             4425 ml  Net            -4205 ml   Filed Weights   08/22/17 2136 08/23/17 0500 08/26/17 0500  Weight: 71 kg (156 lb 8.4 oz) 70.8 kg (156 lb 1.4 oz) 69.7 kg (153 lb 10.2 oz)    Examination:  General exam: Appears calm and comfortable  Respiratory system: Clear to auscultation. Respiratory effort normal. No wheezing or crackle Cardiovascular system: S1 & S2 heard, RRR.  No pedal edema. Gastrointestinal system: Abdomen is nondistended, soft and nontender. Normal bowel sounds heard. Central nervous system: Alert and oriented. No focal neurological deficits. Skin: No rashes, lesions or ulcers Psychiatry: Judgement and insight appear normal. Mood & affect appropriate.  The foley catheter with clear urine but has concentrated urine in the bag   Data Reviewed: I have personally reviewed following labs and imaging studies  CBC:  Recent Labs Lab 08/20/17 1159  08/21/17 0309 08/21/17 1825 08/22/17 1230 08/23/17 0401 08/24/17 0334 08/25/17 0339  WBC 1.5*  < > 1.9* 1.9* 2.3* 2.1* 2.6* 3.1*  NEUTROABS 0.4*  --  0.5* 1.0* 1.2*  --   --   --   HGB 7.0*  < > 7.6* 8.9* 7.8* 9.3* 9.3* 9.6*  HCT 21.9*  < > 22.4* 26.0* 23.2* 28.1* 27.3* 27.9*  MCV 90.1  < > 87.8 86.7 85.3 86.2 87.2 86.9  PLT 26*  < > 66* 47* 33*  35* 39* 38*  < > = values in this interval not displayed. Basic Metabolic Panel:  Recent Labs Lab 08/21/17 0309 08/22/17 1339 08/23/17 0401 08/24/17 0334 08/25/17 0339  NA 142 136 138 136 139  K 4.1 3.6 3.6 3.4* 3.6  CL 105 102 102 100* 105  CO2 29 24 25 26 22   GLUCOSE 106* 115* 107* 118* 135*  BUN 19 17 19 19  23*  CREATININE 1.01 1.05 1.12 1.12 1.10  CALCIUM 9.3 8.8* 8.8* 8.5* 9.0   GFR: Estimated Creatinine Clearance: 51 mL/min (by C-G formula based on SCr of 1.1 mg/dL). Liver Function Tests: No results for input(s): AST, ALT, ALKPHOS, BILITOT, PROT, ALBUMIN in the last 168 hours. No results for input(s): LIPASE, AMYLASE in the last 168  hours. No results for input(s): AMMONIA in the last 168 hours. Coagulation Profile:  Recent Labs Lab 08/23/17 0401  INR 1.27   Cardiac Enzymes: No results for input(s): CKTOTAL, CKMB, CKMBINDEX, TROPONINI in the last 168 hours. BNP (last 3 results) No results for input(s): PROBNP in the last 8760 hours. HbA1C: No results for input(s): HGBA1C in the last 72 hours. CBG: No results for input(s): GLUCAP in the last 168 hours. Lipid Profile: No results for input(s): CHOL, HDL, LDLCALC, TRIG, CHOLHDL, LDLDIRECT in the last 72 hours. Thyroid Function Tests: No results for input(s): TSH, T4TOTAL, FREET4, T3FREE, THYROIDAB in the last 72 hours. Anemia Panel: No results for input(s): VITAMINB12, FOLATE, FERRITIN, TIBC, IRON, RETICCTPCT in the last 72 hours. Sepsis Labs: No results for input(s): PROCALCITON, LATICACIDVEN in the last 168 hours.  Recent Results (from the past 240 hour(s))  TECHNOLOGIST REVIEW     Status: None   Collection Time: 08/20/17 11:59 AM  Result Value Ref Range Status   Technologist Review   Final    Rare blast and myelocyte, mod ovalos, few fragments, helmets and teardrops  Urine culture     Status: Abnormal   Collection Time: 08/22/17  3:00 PM  Result Value Ref Range Status   Specimen Description URINE, CATHETERIZED  Final   Special Requests NONE  Final   Culture 40,000 COLONIES/mL ESCHERICHIA COLI (A)  Final   Report Status 08/24/2017 FINAL  Final   Organism ID, Bacteria ESCHERICHIA COLI (A)  Final      Susceptibility   Escherichia coli - MIC*    AMPICILLIN <=2 SENSITIVE Sensitive     CEFAZOLIN <=4 SENSITIVE Sensitive     CEFTRIAXONE <=1 SENSITIVE Sensitive     CIPROFLOXACIN <=0.25 SENSITIVE Sensitive     GENTAMICIN <=1 SENSITIVE Sensitive     IMIPENEM <=0.25 SENSITIVE Sensitive     NITROFURANTOIN <=16 SENSITIVE Sensitive     TRIMETH/SULFA <=20 SENSITIVE Sensitive     AMPICILLIN/SULBACTAM <=2 SENSITIVE Sensitive     PIP/TAZO <=4 SENSITIVE Sensitive       Extended ESBL NEGATIVE Sensitive     * 40,000 COLONIES/mL ESCHERICHIA COLI  MRSA PCR Screening     Status: None   Collection Time: 08/22/17  9:58 PM  Result Value Ref Range Status   MRSA by PCR NEGATIVE NEGATIVE Final    Comment:        The GeneXpert MRSA Assay (FDA approved for NASAL specimens only), is one component of a comprehensive MRSA colonization surveillance program. It is not intended to diagnose MRSA infection nor to guide or monitor treatment for MRSA infections.          Radiology Studies: No results found.      Scheduled Meds: .  acyclovir  400 mg Oral Daily  . cephALEXin  500 mg Oral Q12H  . fluconazole  200 mg Oral Daily  . gabapentin  200 mg Oral TID  . LORazepam  1 mg Oral QHS  . mouth rinse  15 mL Mouth Rinse BID  . nortriptyline  25 mg Oral QHS  . oxybutynin  5 mg Oral TID  . QUEtiapine  25 mg Oral QHS  . triamcinolone cream  1 application Topical q morning - 10a   Continuous Infusions: . sodium chloride       LOS: 4 days    Getsemani Lindon Tanna Furry, MD [facility] Pager [phone]  If 7PM-7AM, please contact night-coverage [email or web address] Password [email or web address] 08/26/2017, 11:25 AM

## 2017-08-27 DIAGNOSIS — Z515 Encounter for palliative care: Secondary | ICD-10-CM

## 2017-08-27 DIAGNOSIS — B9620 Unspecified Escherichia coli [E. coli] as the cause of diseases classified elsewhere: Secondary | ICD-10-CM

## 2017-08-27 DIAGNOSIS — D62 Acute posthemorrhagic anemia: Secondary | ICD-10-CM

## 2017-08-27 DIAGNOSIS — D469 Myelodysplastic syndrome, unspecified: Secondary | ICD-10-CM

## 2017-08-27 DIAGNOSIS — T83091D Other mechanical complication of indwelling urethral catheter, subsequent encounter: Secondary | ICD-10-CM

## 2017-08-27 DIAGNOSIS — D61818 Other pancytopenia: Secondary | ICD-10-CM

## 2017-08-27 DIAGNOSIS — D649 Anemia, unspecified: Secondary | ICD-10-CM

## 2017-08-27 DIAGNOSIS — R319 Hematuria, unspecified: Secondary | ICD-10-CM

## 2017-08-27 DIAGNOSIS — R310 Gross hematuria: Secondary | ICD-10-CM

## 2017-08-27 DIAGNOSIS — N390 Urinary tract infection, site not specified: Secondary | ICD-10-CM

## 2017-08-27 DIAGNOSIS — F419 Anxiety disorder, unspecified: Secondary | ICD-10-CM

## 2017-08-27 LAB — CBC
HCT: 26.3 % — ABNORMAL LOW (ref 39.0–52.0)
HEMOGLOBIN: 8.7 g/dL — AB (ref 13.0–17.0)
MCH: 29.2 pg (ref 26.0–34.0)
MCHC: 33.1 g/dL (ref 30.0–36.0)
MCV: 88.3 fL (ref 78.0–100.0)
PLATELETS: 23 10*3/uL — AB (ref 150–400)
RBC: 2.98 MIL/uL — ABNORMAL LOW (ref 4.22–5.81)
RDW: 17.7 % — AB (ref 11.5–15.5)
WBC: 2 10*3/uL — ABNORMAL LOW (ref 4.0–10.5)

## 2017-08-27 MED ORDER — CEPHALEXIN 500 MG PO CAPS: 500.0000 mg | ORAL_CAPSULE | Freq: Two times a day (BID) | ORAL | 0 refills | Status: AC

## 2017-08-27 MED ORDER — QUETIAPINE FUMARATE 25 MG PO TABS: 25.0000 mg | ORAL_TABLET | Freq: Every day | ORAL | 0 refills | Status: AC

## 2017-08-27 MED ORDER — BISMUTH SUBSALICYLATE 262 MG/15ML PO SUSP
30.0000 mL | ORAL | Status: DC | PRN
  Filled 2017-08-27: qty 236

## 2017-08-27 MED ORDER — OXYCODONE-ACETAMINOPHEN 7.5-325 MG PO TABS: 0.5000 | ORAL_TABLET | Freq: Three times a day (TID) | ORAL | 0 refills | Status: AC | PRN

## 2017-08-27 NOTE — Progress Notes (Signed)
Report called and given to  Timmothy Sours, Therapist, sports at [facility].

## 2017-08-27 NOTE — Progress Notes (Signed)
Patient ID: Jordan Terry, male   DOB: October 04, 1932, 83 y.o.   MRN: [id number]    Subjective: Urine has remained clear over last 24 hrs off CBI.  Pt remains confused.  Objective: Vital signs in last 24 hours: Temp:  [97.9 F (36.6 C)-98.9 F (37.2 C)] 98.1 F (36.7 C) (11/02 0440) Pulse Rate:  [85-137] 85 (11/02 0440) Resp:  [17-27] 24 (11/02 0440) BP: (106-129)/(56-86) 106/66 (11/02 0440) SpO2:  [94 %-99 %] 98 % (11/02 0440) Weight:  [67.8 kg (149 lb 6.4 oz)] 67.8 kg (149 lb 6.4 oz) (11/02 0440)  Intake/Output from previous day: 11/01 0701 - 11/02 0700 In: 410 [P.O.:360] Out: 1050 [Urine:1050] Intake/Output this shift: No intake/output data recorded.  Physical Exam:  General: Alert and oriented GU: Urine grossly clear and draining well via catheter  Lab Results:  Recent Labs  08/25/17 0339 08/27/17 0503  HGB 9.6* 8.7*  HCT 27.9* 26.3*   CBC Latest Ref Rng & Units 08/27/2017 08/25/2017 08/24/2017  WBC 4.0 - 10.5 K/uL 2.0(L) 3.1(L) 2.6(L)  Hemoglobin 13.0 - 17.0 g/dL 8.7(L) 9.6(L) 9.3(L)  Hematocrit 39.0 - 52.0 % 26.3(L) 27.9(L) 27.3(L)  Platelets 150 - 400 K/uL 23(LL) 38(L) 39(L)      BMET  Recent Labs  08/25/17 0339  NA 139  K 3.6  CL 105  CO2 22  GLUCOSE 135*  BUN 23*  CREATININE 1.10  CALCIUM 9.0     Studies/Results: No results found.  Assessment/Plan: Hematuria due to trauma: Resolved.  Continue catheter upon discharge.  I will arrange outpatient voiding trial in about 2 weeks at our office. UTI: Continue culture specific antibiotics for total of one week.  Will sign off.  Please call if further questions.   LOS: 5 days   Teofil Maniaci,LES 08/27/2017, 7:44 AM

## 2017-08-27 NOTE — Discharge Summary (Signed)
Physician Discharge Summary  Jordan Terry:[id number] DOB: 1932/10/22 DOA: 08/22/2017  PCP: [name], MD  Admit date: 08/22/2017 Discharge date: 08/27/2017  Admitted From: Home Disposition: SNF   Recommendations for Outpatient Follow-up:  1. Follow up with PCP in 1-2 weeks 2. Please obtain CBC next week 3. Follow up with urology in 2 weeks, continue foley catheter until that time.  4. Continue keflex for UTI for total of 7 days (final dose 11/4 pm).   Home Health: N/A Equipment/Devices: Per SNF Discharge Condition: Stable CODE STATUS: Full Diet recommendation: as tolerated  Brief/Interim Summary: Jordan Terry is a 83 y.o.male, with past medical history of prostate cancer, Grovers disease, MDS, pancytopenia,and anxiety admitted for hematuria and recurrent thrombocytopenia. Patient has received multiple units of blood and platelet transfusions with oncology recommending transfusions for hgb <8 and plt <20. Platelets have been stable, albeit low, for several days. Evaluated by urologist and underwent CBI. Urine has remained clear >24 hours off CBI as of 11/2.   Discharge Diagnoses:  Principal Problem:   Hematuria Active Problems:   Myelodysplastic syndrome (HCC)   Obstruction of Foley catheter ([place])   Acute blood loss anemia   E. coli UTI   Encounter for palliative care   Goals of care, counseling/discussion  Hematuria/acute urinary retention with history of prostate CA: Urine has cleared. - Currently resolved.  - Urology has signed off, planning to DC to SNF and follow up with urology in 2 weeks for voiding trial.   Escherichia coli, lower urinary tract infection:  - Patient received 4 days of IV ceftriaxone. Switched to oral Keflex for 7 days.  MDS with pancytopenia: CBC is stable 11/2. Will need continued close monitoring with transfusion thresholds set per heme/onc below:  - Transfuse plt prn for PLT<20k or if bleeding - Transfuse prbc prn for hgb <8 or  if symptomatic (CMV neg irradiated blood products) - Oncology hopeful to complete 2 more (for a total of 6) cycles of chemotherapy (Vidaza) before declaring inadequate response. Family and patient wish to continue this as well if functional status allows.  - Monitor neutropenia which is improving, reactive to infection. Continue fluconazole and acyclovir for prophylaxis.  Weakness: Related to multiple comorbidities.  - Planning DC to SNF  Altered mental status: Continues to be waxing/waning. Possibly effect of delirium from UTI, hospitalization, etc. though family reports gradual functional/cognitive decline over several weeks.  - Delirium precautions, treating UTI as above - Monitor closely.  - Discontinue restoril qHS and replace with seroquel.   Discharge Instructions Discharge Instructions    Discharge patient    Complete by:  As directed    Discharge disposition:  03-Skilled [place]   Discharge patient date:  08/27/2017   Increase activity slowly    Complete by:  As directed      Allergies as of 08/27/2017      Reactions   Benadryl [diphenhydramine Hcl] Anxiety   MAKES HIM GO CRAZY   Hydrocodone Itching   Small amounts are okay      Medication List    STOP taking these medications   temazepam 15 MG capsule Commonly known as:  RESTORIL     TAKE these medications   acyclovir 400 MG tablet Commonly known as:  ZOVIRAX Take 1 tablet (400 mg total) by mouth daily.   cephALEXin 500 MG capsule Commonly known as:  KEFLEX Take 1 capsule (500 mg total) by mouth every 12 (twelve) hours.   fluconazole 200 MG tablet Commonly known  as:  DIFLUCAN Take 1 tablet (200 mg total) by mouth daily.   gabapentin 100 MG capsule Commonly known as:  NEURONTIN Take 200 mg by mouth 3 (three) times daily.   LORazepam 0.5 MG tablet Commonly known as:  ATIVAN Take 1 mg by mouth at bedtime.   nortriptyline 25 MG capsule Commonly known as:  PAMELOR Take 1 capsule (25 mg total) by  mouth at bedtime.   ondansetron 8 MG tablet Commonly known as:  ZOFRAN Take 1 tablet (8 mg total) by mouth 2 (two) times daily as needed (Nausea or vomiting).   oxybutynin 5 MG tablet Commonly known as:  DITROPAN Take 5 mg by mouth 3 (three) times daily.   oxyCODONE-acetaminophen 7.5-325 MG tablet Commonly known as:  PERCOCET Take 0.5-1 tablets by mouth every 8 (eight) hours as needed (pain.).   polyethylene glycol packet Commonly known as:  MIRALAX / GLYCOLAX Take 17 g by mouth 2 (two) times daily.   prochlorperazine 10 MG tablet Commonly known as:  COMPAZINE Take 1 tablet (10 mg total) by mouth every 6 (six) hours as needed (Nausea or vomiting).   QUEtiapine 25 MG tablet Commonly known as:  SEROQUEL Take 1 tablet (25 mg total) by mouth at bedtime.   senna-docusate 8.6-50 MG tablet Commonly known as:  Senokot-S Take 2 tablets by mouth 2 (two) times daily.   triamcinolone cream 0.1 % Commonly known as:  KENALOG Apply 1 application topically every morning. APPLY TO CHEST AND BACK EVERY MORNING FOR [place] DISEASE      Follow-up Information    [name], MD Follow up.   Specialty:  Internal Medicine Contact information: [place] [address] [phone]        Raynelle Bring, MD. Schedule an appointment as soon as possible for a visit in 2 week(s).   Specialty:  Urology Contact information: [place] [address] [phone]        Brunetta Genera, MD Follow up.   Specialties:  Hematology, Oncology Contact information: [address] [place] [address] [phone]          Allergies  Allergen Reactions  . Benadryl [Diphenhydramine Hcl] Anxiety    MAKES HIM GO CRAZY  . Hydrocodone Itching    Small amounts are okay    Consultations:  Urology, Dr. Alinda Money  Hematology, oncology: Dr. Irene Limbo  Palliative Care: Dr. Rowe Pavy  Procedures/Studies: Dg Abd Acute W/chest  Result Date:  08/21/2017 CLINICAL DATA:  Abdominal pain. EXAM: DG ABDOMEN ACUTE W/ 1V CHEST COMPARISON:  June 22, 2017. FINDINGS: There is no evidence of dilated bowel loops or free intraperitoneal air. No radiopaque calculi or other significant radiographic abnormality is seen. Unchanged prostatic seed implants. Degenerative changes of the lumbar spine. Heart size and mediastinal contours are within normal limits. Both lungs are clear. IMPRESSION: Negative abdominal radiographs.  No acute cardiopulmonary disease. Electronically Signed   By: Titus Dubin M.D.   On: 08/21/2017 18:03   Subjective: Feels better/well this morning. No complaints. Urine has cleared, no evident bleeding. No fevers.   Discharge Exam: Vitals:   08/27/17 0440 08/27/17 1447  BP: 106/66 (!) 120/55  Pulse: 85 88  Resp: (!) 24 (!) 22  Temp: 98.1 F (36.7 C) 98.1 F (36.7 C)  SpO2: 98% 98%   General: Pt is alert, awake, not in acute distress Cardiovascular: RRR, S1/S2 +, no rubs, no gallops Respiratory: CTA bilaterally, no wheezing, no rhonchi Abdominal: Soft, NT, ND, bowel sounds + GU:  Foley in place with no irritation/bleeding at urethral meatus. Light yellow translucent urine in bag without blood/clots.   Labs: BNP (last 3 results) No results for input(s): BNP in the last 8760 hours. Basic Metabolic Panel:  Recent Labs Lab 08/21/17 0309 08/22/17 1339 08/23/17 0401 08/24/17 0334 08/25/17 0339  NA 142 136 138 136 139  K 4.1 3.6 3.6 3.4* 3.6  CL 105 102 102 100* 105  CO2 29 24 25 26 22   GLUCOSE 106* 115* 107* 118* 135*  BUN 19 17 19 19  23*  CREATININE 1.01 1.05 1.12 1.12 1.10  CALCIUM 9.3 8.8* 8.8* 8.5* 9.0   Liver Function Tests: No results for input(s): AST, ALT, ALKPHOS, BILITOT, PROT, ALBUMIN in the last 168 hours. No results for input(s): LIPASE, AMYLASE in the last 168 hours. No results for input(s): AMMONIA in the last 168 hours. CBC:  Recent Labs Lab 08/21/17 0309 08/21/17 1825 08/22/17 1230  08/23/17 0401 08/24/17 0334 08/25/17 0339 08/27/17 0503  WBC 1.9* 1.9* 2.3* 2.1* 2.6* 3.1* 2.0*  NEUTROABS 0.5* 1.0* 1.2*  --   --   --   --   HGB 7.6* 8.9* 7.8* 9.3* 9.3* 9.6* 8.7*  HCT 22.4* 26.0* 23.2* 28.1* 27.3* 27.9* 26.3*  MCV 87.8 86.7 85.3 86.2 87.2 86.9 88.3  PLT 66* 47* 33* 35* 39* 38* 23*   Cardiac Enzymes: No results for input(s): CKTOTAL, CKMB, CKMBINDEX, TROPONINI in the last 168 hours. BNP: Invalid input(s): POCBNP CBG: No results for input(s): GLUCAP in the last 168 hours. D-Dimer No results for input(s): DDIMER in the last 72 hours. Hgb A1c No results for input(s): HGBA1C in the last 72 hours. Lipid Profile No results for input(s): CHOL, HDL, LDLCALC, TRIG, CHOLHDL, LDLDIRECT in the last 72 hours. Thyroid function studies No results for input(s): TSH, T4TOTAL, T3FREE, THYROIDAB in the last 72 hours.  Invalid input(s): FREET3 Anemia work up No results for input(s): VITAMINB12, FOLATE, FERRITIN, TIBC, IRON, RETICCTPCT in the last 72 hours. Urinalysis    Component Value Date/Time   COLORURINE RED (A) 08/22/2017 1434   APPEARANCEUR HAZY (A) 08/22/2017 1434   LABSPEC 1.010 08/22/2017 1434   PHURINE 7.0 08/22/2017 1434   GLUCOSEU NEGATIVE 08/22/2017 1434   HGBUR LARGE (A) 08/22/2017 1434   BILIRUBINUR MODERATE (A) 08/22/2017 1434   KETONESUR 15 (A) 08/22/2017 1434   PROTEINUR >300 (A) 08/22/2017 1434   UROBILINOGEN 0.2 03/29/2015 0611   NITRITE POSITIVE (A) 08/22/2017 1434   LEUKOCYTESUR LARGE (A) 08/22/2017 1434    Microbiology Recent Results (from the past 240 hour(s))  TECHNOLOGIST REVIEW     Status: None   Collection Time: 08/20/17 11:59 AM  Result Value Ref Range Status   Technologist Review   Final    Rare blast and myelocyte, mod ovalos, few fragments, helmets and teardrops  Urine culture     Status: Abnormal   Collection Time: 08/22/17  3:00 PM  Result Value Ref Range Status   Specimen Description URINE, CATHETERIZED  Final   Special  Requests NONE  Final   Culture 40,000 COLONIES/mL ESCHERICHIA COLI (A)  Final   Report Status 08/24/2017 FINAL  Final   Organism ID, Bacteria ESCHERICHIA COLI (A)  Final      Susceptibility   Escherichia coli - MIC*    AMPICILLIN <=2 SENSITIVE Sensitive     CEFAZOLIN <=4 SENSITIVE Sensitive     CEFTRIAXONE <=1 SENSITIVE Sensitive     CIPROFLOXACIN <=0.25 SENSITIVE Sensitive     GENTAMICIN <=1 SENSITIVE Sensitive  IMIPENEM <=0.25 SENSITIVE Sensitive     NITROFURANTOIN <=16 SENSITIVE Sensitive     TRIMETH/SULFA <=20 SENSITIVE Sensitive     AMPICILLIN/SULBACTAM <=2 SENSITIVE Sensitive     PIP/TAZO <=4 SENSITIVE Sensitive     Extended ESBL NEGATIVE Sensitive     * 40,000 COLONIES/mL ESCHERICHIA COLI  MRSA PCR Screening     Status: None   Collection Time: 08/22/17  9:58 PM  Result Value Ref Range Status   MRSA by PCR NEGATIVE NEGATIVE Final    Comment:        The GeneXpert MRSA Assay (FDA approved for NASAL specimens only), is one component of a comprehensive MRSA colonization surveillance program. It is not intended to diagnose MRSA infection nor to guide or monitor treatment for MRSA infections.     Time coordinating discharge: Approximately 40 minutes  Vance Gather, MD  [facility] 08/27/2017, 3:37 PM Pager [phone]

## 2017-08-27 NOTE — Progress Notes (Signed)
CRITICAL VALUE ALERT  Critical Value: PLT 23  Date & Time Notied: 08/27/17 0540   Provider Notified: on call - Baltazar Najjar  Orders Received/Actions taken: no orders given

## 2017-08-27 NOTE — Care Management Note (Signed)
Case Management Note  Patient Details  Name: Jordan Terry MRN: [id number] Date of Birth: June 07, 1932  Subjective/Objective: Transfer from SDU. PT-recc SNF. Palliative-SNF w/PCS. CSW already following.                   Action/Plan:d/c SNF w/PCS   Expected Discharge Date:                  Expected Discharge Plan:  [place]  In-House Referral:  Clinical Social Work  Discharge planning Services  CM Consult  Post Acute Care Choice:    Choice offered to:     DME Arranged:    DME Agency:     HH Arranged:    [place] Agency:     Status of Service:  Completed, signed off  If discussed at [employer], dates discussed:    Additional Comments:  Dessa Phi, RN 08/27/2017, 11:35 AM

## 2017-08-27 NOTE — Clinical Social Work Placement (Signed)
Patient received and accepted bed offer at [facility]. Facility aware of patient's discharge and confirmed bed offer. Patient received insurance authorization and authorization provided to SNF. PTAR contacted, patient's family notified. Patient's RN can call report to [phone] complete. CSW signing off, no other needs identified.   CLINICAL SOCIAL WORK PLACEMENT  NOTE  Date:  08/27/2017  Patient Details  Name: Jordan Terry MRN: [id number] Date of Birth: 1932-09-13  Clinical Social Work is seeking post-discharge placement for this patient at the [place] level of care (*CSW will initial, date and re-position this form in  chart as items are completed):  Yes   Patient/family provided with [place] Work Department's list of facilities offering this level of care within the geographic area requested by the patient (or if unable, by the patient's family).  Yes   Patient/family informed of their freedom to choose among providers that offer the needed level of care, that participate in Medicare, Medicaid or managed care program needed by the patient, have an available bed and are willing to accept the patient.  Yes   Patient/family informed of [place]'s ownership interest in [facility] and [facility], as well as of the fact that they are under no obligation to receive care at these facilities.  PASRR submitted to EDS on 08/26/17     PASRR number received on 08/27/17     Existing PASRR number confirmed on       FL2 transmitted to all facilities in geographic area requested by pt/family on 08/26/17     FL2 transmitted to all facilities within larger geographic area on       Patient informed that his/her managed care company has contracts with or will negotiate with certain facilities, including the following:        Yes   Patient/family informed of bed offers received.  Patient chooses bed at [facility]     Physician  recommends and patient chooses bed at      Patient to be transferred to [facility] on 08/27/17.  Patient to be transferred to facility by PTAR     Patient family notified on 08/27/17 of transfer.  Name of family member notified:  [name]     PHYSICIAN Please sign FL2     Additional Comment:    _______________________________________________ Burnis Medin, LCSW 08/27/2017, 4:29 PM

## 2017-08-27 NOTE — Progress Notes (Signed)
[facility] PROGRESS NOTE  [name]  UEK:[id number] DOB: [birth year] DOA: 08/22/2017 PCP: [name], MD Outpatient Specialists:Dr. Irene Limbo, oncology Brief Narrative: Jordan Terry is a 83 y.o.male, with past medical history of prostate cancer, Grovers disease, MDS, pancytopenia,and anxiety admitted for hematuria and recurrent thrombocytopenia. Patient has received multiple units of blood and platelet transfusions with oncology recommending transfusions for hgb <8 and plt <20. Platelets have been stable, albeit low, for several days. Evaluated by urologist and underwent CBI. Urine has remained clear >24 hours off CBI as of 11/2.   Subjective: Feels better/well this morning. No complaints. Urine has cleared, no evident bleeding. No fevers.   Objective: BP 106/66 (BP Location: Right Arm)   Pulse 85   Temp 98.1 F (36.7 C) (Oral)   Resp (!) 24   Ht 6\' 2"  (1.88 m)   Wt 67.8 kg (149 lb 6.4 oz)   SpO2 98%   BMI 19.18 kg/m   Gen: Pleasant, no distress Pulm: Clear and nonlabored on room air  CV: RRR, no murmur, no JVD, no edema GI: Soft, NT, ND, +BS  Neuro: Alert and oriented. No focal deficits. Ext: Warm, no deformities Skin: No ulcers, bleeding GU: Foley in place with no irritation/bleeding at urethral meatus. Light yellow translucent urine in bag without blood/clots.   Assessment & Plan: Hematuria/acute urinary retention with history of prostate CA: Urine has cleared. - Currently resolved.  - Urology has signed off, planning to DC to SNF and follow up with urology in 2 weeks for voiding trial.   Escherichia coli, lower urinary tract infection:  - Patient received 4 days of IV ceftriaxone. Switched to oral Keflex for 7 days.  MDS with pancytopenia: CBC is stable 11/2. Will need continued close monitoring with transfusion thresholds set per heme/onc below:  - Recheck CBC in AM - Transfuse plt prn for PLT<20k or if bleeding - Transfuse prbc prn for hgb <8 or if  symptomatic (CMV neg irradiated blood products) - Oncology hopeful to complete 2 more (for a total of 6) cycles of chemotherapy (Vidaza) before declaring inadequate response. Family and patient wish to continue this as well if functional status allows.  - Monitor neutropenia which is improving, reactive to infection. Continue fluconazole and acyclovir for prophylaxis.  Weakness: Related to multiple comorbidities.  - Planning DC to SNF  Altered mental status: Continues to be waxing/waning. Possibly effect of delirium from UTI, hospitalization, etc. though family reports gradual functional/cognitive decline over several weeks.  - Delirium precautions, treating UTI as above - Monitor closely.  - Discontinue restoril qHS and replace with seroquel.   Vance Gather, MD [facility] Pager [phone]  If 7PM-7AM, please contact night-coverage [email or web address] Password [email or web address] 08/27/2017, 1:36 PM

## 2017-08-27 NOTE — Progress Notes (Signed)
Daily Progress Note   Patient Name: Jordan Terry       Date: 08/27/2017 DOB: [birth year]  Age: 83 y.o. MRN#: [id number] Attending Physician: Patrecia Pour, MD Primary Care Physician: [name], MD Admit Date: 08/22/2017  Reason for Consultation/Follow-up: Establishing goals of care  Subjective:  confused, sitting up in bed In no distress No family at bedside, call placed and discussed with daughter Jerilynn Mages, see below:   Length of Stay: 5  Current Medications: Scheduled Meds:  . acyclovir  400 mg Oral Daily  . cephALEXin  500 mg Oral Q12H  . fluconazole  200 mg Oral Daily  . gabapentin  200 mg Oral TID  . LORazepam  1 mg Oral QHS  . mouth rinse  15 mL Mouth Rinse BID  . nortriptyline  25 mg Oral QHS  . oxybutynin  5 mg Oral TID  . QUEtiapine  25 mg Oral QHS  . triamcinolone cream  1 application Topical q morning - 10a    Continuous Infusions: . sodium chloride      PRN Meds: haloperidol lactate, HYDROmorphone (DILAUDID) injection, lip balm, ondansetron **OR** ondansetron (ZOFRAN) IV, oxyCODONE-acetaminophen  Physical Exam         Awake alert but confused In no distress Scattered bruises Trace edema S1 S2 Clear Abdomen soft  Vital Signs: BP 106/66 (BP Location: Right Arm)   Pulse 85   Temp 98.1 F (36.7 C) (Oral)   Resp (!) 24   Ht 6\' 2"  (1.88 m)   Wt 67.8 kg (149 lb 6.4 oz)   SpO2 98%   BMI 19.18 kg/m  SpO2: SpO2: 98 % O2 Device: O2 Device: Not Delivered O2 Flow Rate:    Intake/output summary:  Intake/Output Summary (Last 24 hours) at 08/27/17 1100 Last data filed at 08/27/17 0930  Gross per 24 hour  Intake              960 ml  Output              200 ml  Net              760 ml   LBM: Last BM Date: 08/26/17 Baseline Weight: Weight: 71  kg (156 lb 8.4 oz) Most recent weight: Weight: 67.8 kg (149 lb 6.4 oz)  Palliative Assessment/Data:    PPS 30%  Patient Active Problem List   Diagnosis Date Noted  . E. coli UTI   . Encounter for palliative care   . Goals of care, counseling/discussion   . Obstruction of Foley catheter ([place])   . Acute blood loss anemia   . Counseling regarding advanced care planning and goals of care 08/05/2017  . Anemia 06/22/2017  . Constipation 06/22/2017  . Myelodysplastic syndrome ([place]) 05/07/2017  . Gross hematuria 04/26/2017  . 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 Dyspnea 04/26/2017  . NHL (non-Hodgkin's lymphoma) ([place])   . Urethral bleeding 04/19/2017  . Pancytopenia ([place]) 04/19/2017  . Sepsis ([place]) 03/09/2016  . Prostate cancer ([place]) 03/08/2016  . Hematuria 03/08/2016  . Fever and chills 03/08/2016  . Fever 03/08/2016  . Abnormality of gait 05/31/2013  . Dizziness and giddiness 05/31/2013    Palliative Care Assessment & Plan   Patient Profile:    Assessment:  83 yo gentleman with progressive pancytopenia, MDS Hematuria UTI Acute delirium History of anxiety  Recommendations/Plan:   call placed and discussed with daughter Jerilynn Mages agent, she says that the patient has been having a gradual functional decline for the past 4 months, patient's wife is frail and weak, unable to care for the patient at home, we discussed about goals of care, code status and appropriate disposition planning.  PLAN: Continue with full code for now, this has been discussed with the patient in the past, by family as well as by prior providers, patient reportedly wished to undergo full scope of a resuscitative attempt and aggressive measures, while daughter understands his overall decline, she wishes to honor the patient's previously expressed wishes, he remains full code for now.   SNF rehab with palliative services to follow on discharge, patient reportedly has a long term care policy, could be in  the SNF for upto 90 days per daughter.   Patient to resume treatment for his MDS with Vidaza, transfusions prn soon.   We briefly reviewed hospice philosophy of care, the patient's family is aware of inpatient hospice type of care, patient's brother died at [facility] 10 yrs ago, daughter Donna's father in law died in a hospice facility in [place], [place] a few years ago.   Follow hospital course for now, monitor PO intake and mental status, to go to SNF on discharge.    Code Status:    Code Status Orders        Start     Ordered   08/22/17 1947  Full code  Continuous     08/22/17 1948    Code Status History    Date Active Date Inactive Code Status Order ID Comments User Context   06/22/2017  6:57 AM 06/23/2017  8:24 PM Full Code [id number]  Rise Patience, MD Inpatient   04/26/2017  5:49 PM 05/05/2017  7:54 PM Full Code [id number]  Reubin Milan, MD Inpatient   04/19/2017 10:20 PM 04/22/2017  9:01 PM Full Code [id number]  Rise Patience, MD Inpatient   03/08/2016 12:44 PM 03/12/2016  4:02 PM Full Code [id number]  Kelvin Cellar, MD Inpatient    Advance Directive Documentation     Most Recent Value  Type of Advance Directive  Healthcare Power of Attorney, Living will  Pre-existing out of facility DNR order (yellow form or pink MOST form)  -  "MOST" Form in Place?  -       Prognosis:   guarded.   Discharge Planning:  Skilled Nursing  Facility for rehab with Palliative care service follow-up  Care plan was discussed with  Patient, daughter Butch Penny.   Thank you for allowing the Palliative Medicine Team to assist in the care of this patient.   Time In: 10 Time Out: 10.35 Total Time 35 Prolonged Time Billed  no       Greater than 50%  of this time was spent counseling and coordinating care related to the above assessment and plan.  Loistine Chance, MD [phone]  Please contact Palliative Medicine Team phone at [phone] for questions and concerns.

## 2017-08-30 ENCOUNTER — Encounter (HOSPITAL_COMMUNITY): Payer: Self-pay

## 2017-08-30 ENCOUNTER — Telehealth: Payer: Self-pay

## 2017-08-30 ENCOUNTER — Inpatient Hospital Stay (HOSPITAL_COMMUNITY)
Admission: EM | Admit: 2017-08-30 | Discharge: 2017-09-07 | DRG: 809 | Disposition: A | Discharge status: Hospice | Payer: Medicare Other | Attending: Internal Medicine | Admitting: Internal Medicine

## 2017-08-30 DIAGNOSIS — Z806 Family history of leukemia: Secondary | ICD-10-CM

## 2017-08-30 DIAGNOSIS — Z8546 Personal history of malignant neoplasm of prostate: Secondary | ICD-10-CM

## 2017-08-30 DIAGNOSIS — Z87891 Personal history of nicotine dependence: Secondary | ICD-10-CM

## 2017-08-30 DIAGNOSIS — Z66 Do not resuscitate: Secondary | ICD-10-CM | POA: Diagnosis not present

## 2017-08-30 DIAGNOSIS — N390 Urinary tract infection, site not specified: Secondary | ICD-10-CM | POA: Diagnosis present

## 2017-08-30 DIAGNOSIS — F0390 Unspecified dementia without behavioral disturbance: Secondary | ICD-10-CM | POA: Diagnosis present

## 2017-08-30 DIAGNOSIS — D61818 Other pancytopenia: Secondary | ICD-10-CM | POA: Diagnosis present

## 2017-08-30 DIAGNOSIS — Z974 Presence of external hearing-aid: Secondary | ICD-10-CM

## 2017-08-30 DIAGNOSIS — L111 Transient acantholytic dermatosis [Grover]: Secondary | ICD-10-CM | POA: Diagnosis present

## 2017-08-30 DIAGNOSIS — Z515 Encounter for palliative care: Secondary | ICD-10-CM

## 2017-08-30 DIAGNOSIS — C61 Malignant neoplasm of prostate: Secondary | ICD-10-CM | POA: Diagnosis present

## 2017-08-30 DIAGNOSIS — F419 Anxiety disorder, unspecified: Secondary | ICD-10-CM | POA: Diagnosis present

## 2017-08-30 DIAGNOSIS — Z9221 Personal history of antineoplastic chemotherapy: Secondary | ICD-10-CM

## 2017-08-30 DIAGNOSIS — G894 Chronic pain syndrome: Secondary | ICD-10-CM | POA: Diagnosis present

## 2017-08-30 DIAGNOSIS — Z681 Body mass index (BMI) 19 or less, adult: Secondary | ICD-10-CM

## 2017-08-30 DIAGNOSIS — R64 Cachexia: Secondary | ICD-10-CM | POA: Diagnosis present

## 2017-08-30 DIAGNOSIS — R310 Gross hematuria: Secondary | ICD-10-CM | POA: Diagnosis not present

## 2017-08-30 DIAGNOSIS — E890 Postprocedural hypothyroidism: Secondary | ICD-10-CM | POA: Diagnosis present

## 2017-08-30 DIAGNOSIS — R319 Hematuria, unspecified: Secondary | ICD-10-CM | POA: Diagnosis present

## 2017-08-30 DIAGNOSIS — Z79899 Other long term (current) drug therapy: Secondary | ICD-10-CM

## 2017-08-30 DIAGNOSIS — N35819 Other urethral stricture, male, unspecified site: Secondary | ICD-10-CM | POA: Diagnosis present

## 2017-08-30 DIAGNOSIS — Z923 Personal history of irradiation: Secondary | ICD-10-CM

## 2017-08-30 DIAGNOSIS — H9190 Unspecified hearing loss, unspecified ear: Secondary | ICD-10-CM | POA: Diagnosis present

## 2017-08-30 DIAGNOSIS — Z888 Allergy status to other drugs, medicaments and biological substances status: Secondary | ICD-10-CM

## 2017-08-30 DIAGNOSIS — Z885 Allergy status to narcotic agent status: Secondary | ICD-10-CM

## 2017-08-30 DIAGNOSIS — Z818 Family history of other mental and behavioral disorders: Secondary | ICD-10-CM

## 2017-08-30 DIAGNOSIS — D469 Myelodysplastic syndrome, unspecified: Secondary | ICD-10-CM | POA: Diagnosis present

## 2017-08-30 DIAGNOSIS — F418 Other specified anxiety disorders: Secondary | ICD-10-CM | POA: Diagnosis present

## 2017-08-30 NOTE — ED Notes (Signed)
Pt is from a facility, he was coming to the ER originally for a low platelet count, when the RN went to change him before coming to the ER, she noticed blood on the floor and his catheter being pulled a little, gross hematuria in his bag also Pt was given ativan prior to leaving the facility

## 2017-08-30 NOTE — Telephone Encounter (Signed)
Pt daughter, Butch Penny, called to express that pt has noted some bleeding in catheter. Pt was recently discharged on Friday from the hospital and transferred to a facility. Pt daughter wondering what they should do. Facility has drawn labs and pt/family waiting on results. Pt is not currently experiencing increasing fatigue, confusion, or vertigo at this time. Pt daughter will get back in touch tomorrow with results in the event that patient may need to be seen. Daughter encouraged to visit ED if pt begins to exhibit any symptoms of concern. Pt daughter verbalized understanding.

## 2017-08-30 NOTE — ED Triage Notes (Signed)
[facility] arrived stating pt observed with blood in urinary catheter bag, nursing home had to stop pt from continuing to pull it. Some external blood was noted. Pt not complaining of pain at this time.

## 2017-08-31 ENCOUNTER — Encounter (HOSPITAL_COMMUNITY): Payer: Self-pay | Admitting: Family Medicine

## 2017-08-31 ENCOUNTER — Other Ambulatory Visit: Payer: Self-pay

## 2017-08-31 DIAGNOSIS — Z888 Allergy status to other drugs, medicaments and biological substances status: Secondary | ICD-10-CM | POA: Diagnosis not present

## 2017-08-31 DIAGNOSIS — Z79899 Other long term (current) drug therapy: Secondary | ICD-10-CM | POA: Diagnosis not present

## 2017-08-31 DIAGNOSIS — Z818 Family history of other mental and behavioral disorders: Secondary | ICD-10-CM | POA: Diagnosis not present

## 2017-08-31 DIAGNOSIS — Z681 Body mass index (BMI) 19 or less, adult: Secondary | ICD-10-CM | POA: Diagnosis not present

## 2017-08-31 DIAGNOSIS — Z806 Family history of leukemia: Secondary | ICD-10-CM | POA: Diagnosis not present

## 2017-08-31 DIAGNOSIS — N390 Urinary tract infection, site not specified: Secondary | ICD-10-CM | POA: Diagnosis present

## 2017-08-31 DIAGNOSIS — F418 Other specified anxiety disorders: Secondary | ICD-10-CM | POA: Diagnosis present

## 2017-08-31 DIAGNOSIS — Z8546 Personal history of malignant neoplasm of prostate: Secondary | ICD-10-CM | POA: Diagnosis not present

## 2017-08-31 DIAGNOSIS — D61818 Other pancytopenia: Principal | ICD-10-CM

## 2017-08-31 DIAGNOSIS — R64 Cachexia: Secondary | ICD-10-CM | POA: Diagnosis present

## 2017-08-31 DIAGNOSIS — C61 Malignant neoplasm of prostate: Secondary | ICD-10-CM

## 2017-08-31 DIAGNOSIS — D469 Myelodysplastic syndrome, unspecified: Secondary | ICD-10-CM | POA: Diagnosis not present

## 2017-08-31 DIAGNOSIS — F419 Anxiety disorder, unspecified: Secondary | ICD-10-CM | POA: Diagnosis present

## 2017-08-31 DIAGNOSIS — N35819 Other urethral stricture, male, unspecified site: Secondary | ICD-10-CM | POA: Diagnosis present

## 2017-08-31 DIAGNOSIS — Z515 Encounter for palliative care: Secondary | ICD-10-CM | POA: Diagnosis not present

## 2017-08-31 DIAGNOSIS — E890 Postprocedural hypothyroidism: Secondary | ICD-10-CM | POA: Diagnosis present

## 2017-08-31 DIAGNOSIS — F0390 Unspecified dementia without behavioral disturbance: Secondary | ICD-10-CM | POA: Diagnosis present

## 2017-08-31 DIAGNOSIS — R310 Gross hematuria: Secondary | ICD-10-CM | POA: Diagnosis present

## 2017-08-31 DIAGNOSIS — Z66 Do not resuscitate: Secondary | ICD-10-CM | POA: Diagnosis not present

## 2017-08-31 DIAGNOSIS — G894 Chronic pain syndrome: Secondary | ICD-10-CM | POA: Diagnosis present

## 2017-08-31 DIAGNOSIS — H9190 Unspecified hearing loss, unspecified ear: Secondary | ICD-10-CM | POA: Diagnosis present

## 2017-08-31 DIAGNOSIS — Z974 Presence of external hearing-aid: Secondary | ICD-10-CM | POA: Diagnosis not present

## 2017-08-31 DIAGNOSIS — Z885 Allergy status to narcotic agent status: Secondary | ICD-10-CM | POA: Diagnosis not present

## 2017-08-31 DIAGNOSIS — L111 Transient acantholytic dermatosis [Grover]: Secondary | ICD-10-CM | POA: Diagnosis present

## 2017-08-31 DIAGNOSIS — Z7189 Other specified counseling: Secondary | ICD-10-CM | POA: Diagnosis not present

## 2017-08-31 DIAGNOSIS — Z87891 Personal history of nicotine dependence: Secondary | ICD-10-CM | POA: Diagnosis not present

## 2017-08-31 LAB — CBC
HCT: 22.5 % — ABNORMAL LOW (ref 39.0–52.0)
Hemoglobin: 7.5 g/dL — ABNORMAL LOW (ref 13.0–17.0)
MCH: 29.9 pg (ref 26.0–34.0)
MCHC: 33.3 g/dL (ref 30.0–36.0)
MCV: 89.6 fL (ref 78.0–100.0)
PLATELETS: 40 10*3/uL — AB (ref 150–400)
RBC: 2.51 MIL/uL — AB (ref 4.22–5.81)
RDW: 17.7 % — AB (ref 11.5–15.5)
WBC: 1.8 10*3/uL — AB (ref 4.0–10.5)

## 2017-08-31 LAB — CBC WITH DIFFERENTIAL/PLATELET
BASOS PCT: 1 %
Basophils Absolute: 0 10*3/uL (ref 0.0–0.1)
EOS PCT: 0 %
Eosinophils Absolute: 0 10*3/uL (ref 0.0–0.7)
HEMATOCRIT: 23.9 % — AB (ref 39.0–52.0)
Hemoglobin: 8 g/dL — ABNORMAL LOW (ref 13.0–17.0)
LYMPHS ABS: 1 10*3/uL (ref 0.7–4.0)
Lymphocytes Relative: 43 %
MCH: 29.6 pg (ref 26.0–34.0)
MCHC: 33.5 g/dL (ref 30.0–36.0)
MCV: 88.5 fL (ref 78.0–100.0)
MONOS PCT: 9 %
Monocytes Absolute: 0.2 10*3/uL (ref 0.1–1.0)
Neutro Abs: 1.1 10*3/uL — ABNORMAL LOW (ref 1.7–7.7)
Neutrophils Relative %: 47 %
Platelets: 15 10*3/uL — CL (ref 150–400)
RBC: 2.7 MIL/uL — AB (ref 4.22–5.81)
RDW: 17.6 % — AB (ref 11.5–15.5)
WBC: 2.3 10*3/uL — AB (ref 4.0–10.5)

## 2017-08-31 LAB — BASIC METABOLIC PANEL
ANION GAP: 10 (ref 5–15)
Anion gap: 9 (ref 5–15)
BUN: 21 mg/dL — ABNORMAL HIGH (ref 6–20)
BUN: 22 mg/dL — AB (ref 6–20)
CALCIUM: 8.7 mg/dL — AB (ref 8.9–10.3)
CHLORIDE: 103 mmol/L (ref 101–111)
CHLORIDE: 104 mmol/L (ref 101–111)
CO2: 26 mmol/L (ref 22–32)
CO2: 26 mmol/L (ref 22–32)
Calcium: 8.7 mg/dL — ABNORMAL LOW (ref 8.9–10.3)
Creatinine, Ser: 1.07 mg/dL (ref 0.61–1.24)
Creatinine, Ser: 1.02 mg/dL (ref 0.61–1.24)
GFR calc Af Amer: >60 mL/min (ref >60)
GFR calc non Af Amer: >60 mL/min (ref >60)
GFR calc non Af Amer: >60 mL/min (ref >60)
GLUCOSE: 119 mg/dL — AB (ref 65–99)
GLUCOSE: 122 mg/dL — AB (ref 65–99)
POTASSIUM: 3.7 mmol/L (ref 3.5–5.1)
POTASSIUM: 4 mmol/L (ref 3.5–5.1)
SODIUM: 138 mmol/L (ref 135–145)
Sodium: 140 mmol/L (ref 135–145)

## 2017-08-31 LAB — URINALYSIS, MICROSCOPIC (REFLEX)

## 2017-08-31 LAB — URINALYSIS, ROUTINE W REFLEX MICROSCOPIC

## 2017-08-31 MED ORDER — GABAPENTIN 100 MG PO CAPS
200.0000 mg | ORAL_CAPSULE | Freq: Three times a day (TID) | ORAL | Status: DC
  Administered 2017-08-31 – 2017-09-07 (×21): 200 mg via ORAL
  Filled 2017-08-31 (×22): qty 2

## 2017-08-31 MED ORDER — OXYCODONE-ACETAMINOPHEN 7.5-325 MG PO TABS
0.5000 | ORAL_TABLET | Freq: Three times a day (TID) | ORAL | Status: DC | PRN
  Administered 2017-08-31 – 2017-09-07 (×14): 1 via ORAL
  Filled 2017-08-31 (×14): qty 1

## 2017-08-31 MED ORDER — CEPHALEXIN 500 MG PO CAPS
500.0000 mg | ORAL_CAPSULE | Freq: Two times a day (BID) | ORAL | Status: DC
  Administered 2017-08-31 – 2017-09-01 (×3): 500 mg via ORAL
  Filled 2017-08-31 (×3): qty 1

## 2017-08-31 MED ORDER — ACETAMINOPHEN 325 MG PO TABS
650.0000 mg | ORAL_TABLET | Freq: Four times a day (QID) | ORAL | Status: DC | PRN
  Administered 2017-09-02 – 2017-09-06 (×2): 650 mg via ORAL
  Filled 2017-08-31 (×2): qty 2

## 2017-08-31 MED ORDER — ACETAMINOPHEN 650 MG RE SUPP: 650.0000 mg | Freq: Four times a day (QID) | RECTAL | Status: DC | PRN

## 2017-08-31 MED ORDER — FLUCONAZOLE 200 MG PO TABS
200.0000 mg | ORAL_TABLET | Freq: Every day | ORAL | Status: DC
  Administered 2017-08-31 – 2017-09-07 (×8): 200 mg via ORAL
  Filled 2017-08-31: qty 2
  Filled 2017-08-31: qty 1
  Filled 2017-08-31 (×2): qty 2
  Filled 2017-08-31: qty 1
  Filled 2017-08-31 (×3): qty 2
  Filled 2017-08-31 (×3): qty 1
  Filled 2017-08-31: qty 2
  Filled 2017-08-31 (×3): qty 1
  Filled 2017-08-31: qty 2

## 2017-08-31 MED ORDER — ONDANSETRON HCL 4 MG/2ML IJ SOLN: 4.0000 mg | Freq: Four times a day (QID) | INTRAMUSCULAR | Status: DC | PRN

## 2017-08-31 MED ORDER — ONDANSETRON HCL 4 MG PO TABS
4.0000 mg | ORAL_TABLET | Freq: Four times a day (QID) | ORAL | Status: DC | PRN
Start: 2017-08-31 — End: 2017-09-07

## 2017-08-31 MED ORDER — SENNOSIDES-DOCUSATE SODIUM 8.6-50 MG PO TABS
2.0000 | ORAL_TABLET | Freq: Two times a day (BID) | ORAL | Status: DC
  Administered 2017-08-31 – 2017-09-07 (×15): 2 via ORAL
  Filled 2017-08-31 (×15): qty 2

## 2017-08-31 MED ORDER — POLYETHYLENE GLYCOL 3350 17 G PO PACK
17.0000 g | PACK | Freq: Two times a day (BID) | ORAL | Status: DC
  Administered 2017-08-31 – 2017-09-07 (×15): 17 g via ORAL
  Filled 2017-08-31 (×15): qty 1

## 2017-08-31 MED ORDER — LORAZEPAM 1 MG PO TABS
1.0000 mg | ORAL_TABLET | Freq: Every day | ORAL | Status: DC
  Administered 2017-08-31 – 2017-09-06 (×7): 1 mg via ORAL
  Filled 2017-08-31 (×7): qty 1

## 2017-08-31 MED ORDER — NORTRIPTYLINE HCL 25 MG PO CAPS
25.0000 mg | ORAL_CAPSULE | Freq: Every day | ORAL | Status: DC
Start: 2017-08-31 — End: 2017-09-07
  Administered 2017-08-31 – 2017-09-06 (×7): 25 mg via ORAL
  Filled 2017-08-31 (×8): qty 1

## 2017-08-31 MED ORDER — QUETIAPINE FUMARATE 25 MG PO TABS
25.0000 mg | ORAL_TABLET | Freq: Every day | ORAL | Status: DC
  Administered 2017-08-31 – 2017-09-06 (×7): 25 mg via ORAL
  Filled 2017-08-31 (×7): qty 1

## 2017-08-31 MED ORDER — ACYCLOVIR 400 MG PO TABS
400.0000 mg | ORAL_TABLET | Freq: Every day | ORAL | Status: DC
  Administered 2017-08-31 – 2017-09-07 (×8): 400 mg via ORAL
  Filled 2017-08-31 (×8): qty 1

## 2017-08-31 MED ORDER — TRIAMCINOLONE ACETONIDE 0.1 % EX CREA
1.0000 "application " | TOPICAL_CREAM | Freq: Every morning | CUTANEOUS | Status: DC
  Administered 2017-09-01 – 2017-09-07 (×7): 1 via TOPICAL
  Filled 2017-08-31: qty 15

## 2017-08-31 MED ORDER — SODIUM CHLORIDE 0.9 % IV SOLN
10.0000 mL/h | Freq: Once | INTRAVENOUS | Status: AC
  Administered 2017-08-31: 10 mL/h via INTRAVENOUS

## 2017-08-31 MED ORDER — OXYBUTYNIN CHLORIDE 5 MG PO TABS
5.0000 mg | ORAL_TABLET | Freq: Three times a day (TID) | ORAL | Status: DC
  Administered 2017-08-31 – 2017-09-07 (×21): 5 mg via ORAL
  Filled 2017-08-31 (×22): qty 1

## 2017-08-31 NOTE — ED Provider Notes (Signed)
[facility] DEPT Provider Note   CSN: [id number] Arrival date & time: 08/30/17  2312     History   Chief Complaint Chief Complaint  Patient presents with  . Hematuria    HPI Jordan Terry is a 83 y.o. male.   The history is provided by the nursing home, the patient and a relative. The history is limited by the condition of the patient (Dementia).   Hematuria   He has a history of myelodysplastic syndrome with pancytopenia.  Recently hospitalized for hematuria, sent to nursing home for rehabilitation.  Foley catheter was still in place.  He started having bleeding through the catheter tonight, and also blood work at the nursing facility had shown very low platelet count.  Specific lab values were not included in his transfer papers.  There has been no known fever.  No vomiting or diarrhea.  No other bleeding.  Past Medical History:  Diagnosis Date  . Anginal pain ([place]) 20 years ago  . Anxiety   . Arthritis   . Bilateral tinnitus    wears hearing aides  . Cancer [facility])    prostate  . Grover's disease   . Headache(784.0)    not since retired  . Hematuria 04/14/2017  . Pancytopenia ([place]) 04/19/2017  . Self-catheterizes urinary bladder 04/19/2017  . Vertigo     Patient Active Problem List   Diagnosis Date Noted  . E. coli UTI   . Encounter for palliative care   . Goals of care, counseling/discussion   . Obstruction of Foley catheter ([place])   . Acute blood loss anemia   . Counseling regarding advanced care planning and goals of care 08/05/2017  . Anemia 06/22/2017  . Constipation 06/22/2017  . Myelodysplastic syndrome ([place]) 05/07/2017  . Gross hematuria 04/26/2017  . 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 Dyspnea 04/26/2017  . NHL (non-Hodgkin's lymphoma) ([place])   . Urethral bleeding 04/19/2017  . Pancytopenia ([place]) 04/19/2017  . Sepsis ([place]) 03/09/2016  . Prostate cancer ([place]) 03/08/2016  . Hematuria 03/08/2016  . Fever and chills 03/08/2016   . Fever 03/08/2016  . Abnormality of gait 05/31/2013  . Dizziness and giddiness 05/31/2013    Past Surgical History:  Procedure Laterality Date  . CARDIAC CATHETERIZATION  "20 years ago"  . CARPAL TUNNEL RELEASE Left 2008  . CATARACT EXTRACTION Bilateral 2014  . [place]   "scope"  . NOSE SURGERY  1991, 1998  . RADIOACTIVE SEED IMPLANT  2002   "for prostate cancer"  . SHOULDER SURGERY  2012   right  . THYROIDECTOMY, PARTIAL  1974  . VASECTOMY  1970       Home Medications    Prior to Admission medications   Medication Sig Start Date End Date Taking? Authorizing Provider  acyclovir (ZOVIRAX) 400 MG tablet Take 1 tablet (400 mg total) by mouth daily. 08/10/17   Brunetta Genera, MD  fluconazole (DIFLUCAN) 200 MG tablet Take 1 tablet (200 mg total) by mouth daily. 08/10/17   Brunetta Genera, MD  gabapentin (NEURONTIN) 100 MG capsule Take 200 mg by mouth 3 (three) times daily.     [provider]  LORazepam (ATIVAN) 0.5 MG tablet Take 1 mg by mouth at bedtime.    [provider]  nortriptyline (PAMELOR) 25 MG capsule Take 1 capsule (25 mg total) by mouth at bedtime. 01/19/17   Dennie Bible, NP  ondansetron (ZOFRAN) 8 MG tablet Take 1 tablet (8 mg total) by mouth 2 (  two) times daily as needed (Nausea or vomiting). 05/17/17   Brunetta Genera, MD  oxybutynin (DITROPAN) 5 MG tablet Take 5 mg by mouth 3 (three) times daily.  08/21/17   [provider]  oxyCODONE-acetaminophen (PERCOCET) 7.5-325 MG tablet Take 0.5-1 tablets by mouth every 8 (eight) hours as needed (pain.). 08/27/17   Patrecia Pour, MD  polyethylene glycol (MIRALAX / Floria Raveling) packet Take 17 g by mouth 2 (two) times daily. 07/14/17   Brunetta Genera, MD  prochlorperazine (COMPAZINE) 10 MG tablet Take 1 tablet (10 mg total) by mouth every 6 (six) hours as needed (Nausea or vomiting). 05/17/17   Brunetta Genera, MD  QUEtiapine (SEROQUEL) 25 MG tablet Take 1  tablet (25 mg total) by mouth at bedtime. 08/27/17   Patrecia Pour, MD  senna-docusate (SENOKOT-S) 8.6-50 MG tablet Take 2 tablets by mouth 2 (two) times daily. 06/23/17   Dessa Phi Chahn-Yang, DO  triamcinolone cream (KENALOG) 0.1 % Apply 1 application topically every morning. APPLY TO CHEST AND BACK EVERY MORNING FOR [place] DISEASE    [provider]    Family History Family History  Problem Relation Age of Onset  . Anxiety disorder Mother   . Cancer Father        colon, lung  . Diabetes Brother   . Cancer Brother   . Diabetes Sister   . Leukemia Sister   . Cancer Sister        breast  . Cancer Sister        breast  . Cancer Sister        thyroid  . Diabetes Sister     Social History Social History   Tobacco Use  . Smoking status: Former Smoker    Years: 10.00    Types: Cigars    Last attempt to quit: 1979    Years since quitting: 39.8  . Smokeless tobacco: Never Used  Substance Use Topics  . Alcohol use: No  . Drug use: No     Allergies   Benadryl [diphenhydramine hcl] and Hydrocodone   Review of Systems Review of Systems  Unable to perform ROS: Dementia  Genitourinary: Positive for hematuria.     Physical Exam Updated Vital Signs BP 105/62 (BP Location: Left Arm)   Pulse 75   Temp 99 F (37.2 C) (Oral)   Resp 20   SpO2 96%   Physical Exam  Nursing note and vitals reviewed.  83 year old male, resting comfortably and in no acute distress. Vital signs are normal. Oxygen saturation is 96%, which is normal. Head is normocephalic and atraumatic. PERRLA, EOMI. Oropharynx is clear. Neck is nontender and supple without adenopathy or JVD. Back is nontender and there is no CVA tenderness. Lungs are clear without rales, wheezes, or rhonchi. Chest is nontender. Heart has regular rate and rhythm without murmur. Abdomen is soft, flat, nontender without masses or hepatosplenomegaly and peristalsis is normoactive. Genitalia: Circumcised penis.   Three-way Foley catheter in place draining bloody urine. Extremities have no cyanosis or edema, full range of motion is present. Skin is warm and dry without rash. Neurologic: Awake, alert, oriented, cranial nerves are intact, there are no motor or sensory deficits.  ED Treatments / Results  Labs (all labs ordered are listed, but only abnormal results are displayed) Labs Reviewed  BASIC METABOLIC PANEL - Abnormal; Notable for the following components:      Result Value   Glucose, Bld 122 (*)    BUN 22 (*)  Calcium 8.7 (*)    All other components within normal limits  CBC WITH DIFFERENTIAL/PLATELET - Abnormal; Notable for the following components:   WBC 2.3 (*)    RBC 2.70 (*)    Hemoglobin 8.0 (*)    HCT 23.9 (*)    RDW 17.6 (*)    Platelets 15 (*)    Neutro Abs 1.1 (*)    All other components within normal limits  URINALYSIS, ROUTINE W REFLEX MICROSCOPIC - Abnormal; Notable for the following components:   Color, Urine RED (*)    APPearance TURBID (*)    Glucose, UA   (*)    Value: TEST NOT REPORTED DUE TO COLOR INTERFERENCE OF URINE PIGMENT   Hgb urine dipstick   (*)    Value: TEST NOT REPORTED DUE TO COLOR INTERFERENCE OF URINE PIGMENT   Bilirubin Urine   (*)    Value: TEST NOT REPORTED DUE TO COLOR INTERFERENCE OF URINE PIGMENT   Ketones, ur   (*)    Value: TEST NOT REPORTED DUE TO COLOR INTERFERENCE OF URINE PIGMENT   Protein, ur   (*)    Value: TEST NOT REPORTED DUE TO COLOR INTERFERENCE OF URINE PIGMENT   Nitrite   (*)    Value: TEST NOT REPORTED DUE TO COLOR INTERFERENCE OF URINE PIGMENT   Leukocytes, UA   (*)    Value: TEST NOT REPORTED DUE TO COLOR INTERFERENCE OF URINE PIGMENT   All other components within normal limits  URINALYSIS, MICROSCOPIC (REFLEX) - Abnormal; Notable for the following components:   Bacteria, UA FEW (*)    Squamous Epithelial / LPF 0-5 (*)    All other components within normal limits  URINE CULTURE  BASIC METABOLIC PANEL  TYPE AND  SCREEN  PREPARE PLATELET PHERESIS    Procedures Procedures (including critical care time)  Medications Ordered in ED Medications  0.9 %  sodium chloride infusion (not administered)  acyclovir (ZOVIRAX) tablet 400 mg (not administered)  cephALEXin (KEFLEX) capsule 500 mg (not administered)  fluconazole (DIFLUCAN) tablet 200 mg (not administered)  gabapentin (NEURONTIN) capsule 200 mg (not administered)  LORazepam (ATIVAN) tablet 1 mg (not administered)  nortriptyline (PAMELOR) capsule 25 mg (not administered)  oxybutynin (DITROPAN) tablet 5 mg (not administered)  oxyCODONE-acetaminophen (PERCOCET) 7.5-325 MG per tablet 0.5-1 tablet (not administered)  polyethylene glycol (MIRALAX / GLYCOLAX) packet 17 g (not administered)  QUEtiapine (SEROQUEL) tablet 25 mg (not administered)  senna-docusate (Senokot-S) tablet 2 tablet (not administered)  triamcinolone cream (KENALOG) 0.1 % 1 application (not administered)  acetaminophen (TYLENOL) tablet 650 mg (not administered)    Or  acetaminophen (TYLENOL) suppository 650 mg (not administered)  ondansetron (ZOFRAN) tablet 4 mg (not administered)    Or  ondansetron (ZOFRAN) injection 4 mg (not administered)     Initial Impression / Assessment and Plan / ED Course  I have reviewed the triage vital signs and the nursing notes.  Pertinent lab results that were available during my care of the patient were reviewed by me and considered in my medical decision making (see chart for details).  Gross hematuria in patient with known history of severe thrombocytopenia.  Old records are reviewed, and the had recent hospitalization because of gross hematuria which did require blood transfusions as well as platelet transfusions.  His Foley catheter will be hooked up for continuous bladder irrigation.  We will check CBC and platelet count.  Transfuse if necessary.  Hemoglobin has come back 8.0 with specific drop from 8.7 four days ago.WBC  is 2.3 which is  unchanged..Platelet count is 15, which is a drop from 23.   Platelet transfusion is ordered.  He is likely to need RBC transfusion as well.  Case is discussed with Dr. Myna Hidalgo of [facility], who agrees to admit the patient.  Final Clinical Impressions(s) / ED Diagnoses   Final diagnoses:  Gross hematuria  Pancytopenia [facility])    ED Discharge Orders    None      Delora Fuel, MD 06/00/45 0330

## 2017-08-31 NOTE — ED Notes (Signed)
Spoke with lab about patient's platelets. Platelets have to come from [facility]. Stat order put in by lab. Floor made aware.

## 2017-08-31 NOTE — Care Management Note (Signed)
Case Management Note  Patient Details  Name: Jordan Terry MRN: [id number] Date of Birth: [birth year]  Subjective/Objective: 83 y/o m admitted w/Gross hematuria. From SNF-[address]. PT cons-await recc. CSW already following.                   Action/Plan:d/c SNF.   Expected Discharge Date:                  Expected Discharge Plan:  Skilled Nursing Facility  In-House Referral:  Clinical Social Work  Discharge planning Services  CM Consult  Post Acute Care Choice:    Choice offered to:     DME Arranged:    DME Agency:     HH Arranged:    [place] Agency:     Status of Service:  In process, will continue to follow  If discussed at Long Length of Stay Meetings, dates discussed:    Additional Comments:  Dessa Phi, RN 08/31/2017, 1:40 PM

## 2017-08-31 NOTE — H&P (Signed)
History and Physical    Jordan Terry:[id number] DOB: 1932-03-03 DOA: 08/30/2017  PCP: [name], MD   Patient coming from: SNF  Chief Complaint: Johney [place] hematuria   HPI: Jordan Terry is a 83 y.o. male with medical history significant for prostate cancer, depression with anxiety, chronic pain, and myelodysplastic syndrome followed by oncology and under treatment with chemotherapy, now presenting to the emergency department from his SNF for evaluation of gross hematuria.  Patient was recently hospitalized from 08/22/2017 until 08/27/2017 under similar circumstances.  He required transfusion of multiple units of red blood cells, as well as platelets during the admission, the bleeding eventually stopped, cell counts remained low but stable, and he was discharged to a SNF.  He did well there initially before developing recurrent gross hematuria yesterday, persisting, and eventually prompting his presentation to the ED tonight.  He was confused and possibly delirious during the recent admission and that has continued at the SNF, with staff reporting that he has been pulling at the catheter.   ED Course: Upon arrival to the ED, patient is found to be afebrile, saturating well on room air, and with vitals otherwise stable.  Chemistry panel is largely unremarkable and CBC is notable for pancytopenia with WBC 2300, hemoglobin 8.0, and platelets 15,000.  Type and screen was performed in the ED, continuous bladder irrigation was initiated, and 1 unit of platelets were ordered for immediate transfusion.  The patient remained heme stable and in no apparent respiratory distress, and he will be admitted to the medical-surgical unit for ongoing evaluation and management of gross hematuria with thrombocytopenia.  Review of Systems:  All other systems reviewed and apart from HPI, are negative.  Past Medical History:  Diagnosis Date  . Anginal pain ([place]) 20 years ago  . Anxiety   . Arthritis   .  Bilateral tinnitus    wears hearing aides  . [facility])    prostate  . Grover's disease   . Headache(784.0)    not since retired  . Hematuria 04/14/2017  . Pancytopenia ([place]) 04/19/2017  . Self-catheterizes urinary bladder 04/19/2017  . Vertigo     Past Surgical History:  Procedure Laterality Date  . CARDIAC CATHETERIZATION  "20 years ago"  . CARPAL TUNNEL RELEASE Left 2008  . CATARACT EXTRACTION Bilateral 2014  . [place]   "scope"  . NOSE SURGERY  1991, 1998  . RADIOACTIVE SEED IMPLANT  2002   "for prostate cancer"  . SHOULDER SURGERY  2012   right  . THYROIDECTOMY, PARTIAL  1974  . VASECTOMY  1970     reports that he quit smoking about 39 years ago. His smoking use included cigars. He quit after 10.00 years of use. he has never used smokeless tobacco. He reports that he does not drink alcohol or use drugs.  Allergies  Allergen Reactions  . Benadryl [Diphenhydramine Hcl] Anxiety    MAKES HIM GO CRAZY  . Hydrocodone Itching    Small amounts are okay    Family History  Problem Relation Age of Onset  . Anxiety disorder Mother   . Cancer Father        colon, lung  . Diabetes Brother   . Cancer Brother   . Diabetes Sister   . Leukemia Sister   . Cancer Sister        breast  . Cancer Sister        breast  . Cancer Sister  thyroid  . Diabetes Sister      Prior to Admission medications   Medication Sig Start Date End Date Taking? Authorizing Provider  acyclovir (ZOVIRAX) 400 MG tablet Take 1 tablet (400 mg total) by mouth daily. 08/10/17   Brunetta Genera, MD  fluconazole (DIFLUCAN) 200 MG tablet Take 1 tablet (200 mg total) by mouth daily. 08/10/17   Brunetta Genera, MD  gabapentin (NEURONTIN) 100 MG capsule Take 200 mg by mouth 3 (three) times daily.     [provider]  LORazepam (ATIVAN) 0.5 MG tablet Take 1 mg by mouth at bedtime.    [provider]  nortriptyline (PAMELOR) 25 MG capsule Take 1 capsule (25 mg  total) by mouth at bedtime. 01/19/17   Dennie Bible, NP  ondansetron (ZOFRAN) 8 MG tablet Take 1 tablet (8 mg total) by mouth 2 (two) times daily as needed (Nausea or vomiting). 05/17/17   Brunetta Genera, MD  oxybutynin (DITROPAN) 5 MG tablet Take 5 mg by mouth 3 (three) times daily.  08/21/17   [provider]  oxyCODONE-acetaminophen (PERCOCET) 7.5-325 MG tablet Take 0.5-1 tablets by mouth every 8 (eight) hours as needed (pain.). 08/27/17   Patrecia Pour, MD  polyethylene glycol (MIRALAX / Floria Raveling) packet Take 17 g by mouth 2 (two) times daily. 07/14/17   Brunetta Genera, MD  prochlorperazine (COMPAZINE) 10 MG tablet Take 1 tablet (10 mg total) by mouth every 6 (six) hours as needed (Nausea or vomiting). 05/17/17   Brunetta Genera, MD  QUEtiapine (SEROQUEL) 25 MG tablet Take 1 tablet (25 mg total) by mouth at bedtime. 08/27/17   Patrecia Pour, MD  senna-docusate (SENOKOT-S) 8.6-50 MG tablet Take 2 tablets by mouth 2 (two) times daily. 06/23/17   Dessa Phi Chahn-Yang, DO  triamcinolone cream (KENALOG) 0.1 % Apply 1 application topically every morning. APPLY TO CHEST AND BACK EVERY MORNING FOR [place] DISEASE    [provider]    Physical Exam: Vitals:   08/30/17 2341  BP: 105/62  Pulse: 75  Resp: 20  Temp: 99 F (37.2 C)  TempSrc: Oral  SpO2: 96%      Constitutional: NAD, calm, cachectic Eyes: PERTLA, lids and conjunctivae normal ENMT: Mucous membranes are moist. Posterior pharynx clear of any exudate or lesions.   Neck: normal, supple, no masses, no thyromegaly Respiratory: clear to auscultation bilaterally, no wheezing, no crackles. Normal respiratory effort.    Cardiovascular: S1 & S2 heard, regular rate and rhythm. No extremity edema. No significant JVD. Abdomen: No distension, no tenderness, soft. Bowel sounds active.  Musculoskeletal: no clubbing / cyanosis. No joint deformity upper and lower extremities.  Skin: Scattered echymoses.  Warm, dry, well-perfused. Neurologic: No facial asymmetry. Sensation intact. Strength 5/5 in all 4 limbs. Gross hearing deficit.  Psychiatric: Alert and oriented to person and place. Pleasant and cooperative.     Labs on Admission: I have personally reviewed following labs and imaging studies  CBC: Recent Labs  Lab 08/24/17 0334 08/25/17 0339 08/27/17 0503 08/31/17 0038  WBC 2.6* 3.1* 2.0* 2.3*  NEUTROABS  --   --   --  1.1*  HGB 9.3* 9.6* 8.7* 8.0*  HCT 27.3* 27.9* 26.3* 23.9*  MCV 87.2 86.9 88.3 88.5  PLT 39* 38* 23* 15*   Basic Metabolic Panel: Recent Labs  Lab 08/24/17 0334 08/25/17 0339 08/31/17 0038  NA 136 139 138  K 3.4* 3.6 4.0  CL 100* 105 103  CO2 26 22 26  GLUCOSE 118* 135* 122*  BUN 19 23* 22*  CREATININE 1.12 1.10 1.07  CALCIUM 8.5* 9.0 8.7*   GFR: Estimated Creatinine Clearance: 51 mL/min (by C-G formula based on SCr of 1.07 mg/dL). Liver Function Tests: No results for input(s): AST, ALT, ALKPHOS, BILITOT, PROT, ALBUMIN in the last 168 hours. No results for input(s): LIPASE, AMYLASE in the last 168 hours. No results for input(s): AMMONIA in the last 168 hours. Coagulation Profile: No results for input(s): INR, PROTIME in the last 168 hours. Cardiac Enzymes: No results for input(s): CKTOTAL, CKMB, CKMBINDEX, TROPONINI in the last 168 hours. BNP (last 3 results) No results for input(s): PROBNP in the last 8760 hours. HbA1C: No results for input(s): HGBA1C in the last 72 hours. CBG: No results for input(s): GLUCAP in the last 168 hours. Lipid Profile: No results for input(s): CHOL, HDL, LDLCALC, TRIG, CHOLHDL, LDLDIRECT in the last 72 hours. Thyroid Function Tests: No results for input(s): TSH, T4TOTAL, FREET4, T3FREE, THYROIDAB in the last 72 hours. Anemia Panel: No results for input(s): VITAMINB12, FOLATE, FERRITIN, TIBC, IRON, RETICCTPCT in the last 72 hours. Urine analysis:    Component Value Date/Time   COLORURINE RED (A) 08/31/2017 0127     APPEARANCEUR TURBID (A) 08/31/2017 0127   LABSPEC  08/31/2017 0127    TEST NOT REPORTED DUE TO COLOR INTERFERENCE OF URINE PIGMENT   PHURINE  08/31/2017 0127    TEST NOT REPORTED DUE TO COLOR INTERFERENCE OF URINE PIGMENT   GLUCOSEU (A) 08/31/2017 0127    TEST NOT REPORTED DUE TO COLOR INTERFERENCE OF URINE PIGMENT   HGBUR (A) 08/31/2017 0127    TEST NOT REPORTED DUE TO COLOR INTERFERENCE OF URINE PIGMENT   BILIRUBINUR (A) 08/31/2017 0127    TEST NOT REPORTED DUE TO COLOR INTERFERENCE OF URINE PIGMENT   KETONESUR (A) 08/31/2017 0127    TEST NOT REPORTED DUE TO COLOR INTERFERENCE OF URINE PIGMENT   PROTEINUR (A) 08/31/2017 0127    TEST NOT REPORTED DUE TO COLOR INTERFERENCE OF URINE PIGMENT   UROBILINOGEN 0.2 03/29/2015 0611   NITRITE (A) 08/31/2017 0127    TEST NOT REPORTED DUE TO COLOR INTERFERENCE OF URINE PIGMENT   LEUKOCYTESUR (A) 08/31/2017 0127    TEST NOT REPORTED DUE TO COLOR INTERFERENCE OF URINE PIGMENT   Sepsis Labs: @LABRCNTIP (procalcitonin:4,lacticidven:4) ) Recent Results (from the past 240 hour(s))  Urine culture     Status: Abnormal   Collection Time: 08/22/17  3:00 PM  Result Value Ref Range Status   Specimen Description URINE, CATHETERIZED  Final   Special Requests NONE  Final   Culture 40,000 COLONIES/mL ESCHERICHIA COLI (A)  Final   Report Status 08/24/2017 FINAL  Final   Organism ID, Bacteria ESCHERICHIA COLI (A)  Final      Susceptibility   Escherichia coli - MIC*    AMPICILLIN <=2 SENSITIVE Sensitive     CEFAZOLIN <=4 SENSITIVE Sensitive     CEFTRIAXONE <=1 SENSITIVE Sensitive     CIPROFLOXACIN <=0.25 SENSITIVE Sensitive     GENTAMICIN <=1 SENSITIVE Sensitive     IMIPENEM <=0.25 SENSITIVE Sensitive     NITROFURANTOIN <=16 SENSITIVE Sensitive     TRIMETH/SULFA <=20 SENSITIVE Sensitive     AMPICILLIN/SULBACTAM <=2 SENSITIVE Sensitive     PIP/TAZO <=4 SENSITIVE Sensitive     Extended ESBL NEGATIVE Sensitive     * 40,000 COLONIES/mL ESCHERICHIA COLI   MRSA PCR Screening     Status: None   Collection Time: 08/22/17  9:58 PM  Result Value  Ref Range Status   MRSA by PCR NEGATIVE NEGATIVE Final    Comment:        The GeneXpert MRSA Assay (FDA approved for NASAL specimens only), is one component of a comprehensive MRSA colonization surveillance program. It is not intended to diagnose MRSA infection nor to guide or monitor treatment for MRSA infections.      Radiological Exams on Admission: No results found.  EKG: Not performed.   Assessment/Plan  1. Gross hematuria  - Pt presents from SNF with gross hematuria  - There is a small drop in Hgb and platelets only 15,000  - Continuous bladder irrigation started in ED and 1 unit platelets ordered for immediate transfusion  - Continue CBI, check post-transfusion CBC, continue Keflex for UTI    2. Pancytopenia, MDS  - WBC is 2,300 on admission, Hgb 8.0, and platelets 15,000  - He is followed by oncology and under treatment with Vidaza  - Transfusing 1 unit of platelets and will check post-transfusion CBC  - Per oncology, transfuse for goal Hgb of 8, transfuse platelets for count <20k or bleeding, and continue Diflucan and acyclovir ppx   3. Chronic pain  - Continue oxycodone, gabapentin, and bowel regimen    4. Depression, anxiety  - Continue Seroquel, Ativan, and nortriptyline   5. UTI  - Treated with IV Rocephin during recent admission and discharged with Keflex  - UA pending  - Continue Keflex     DVT prophylaxis: SCD's Code Status: Full  Family Communication: Discussed with patient Disposition Plan: Admit to med-surg Consults called: None Admission status: Inpatient    Vianne Bulls, MD [facility] Pager [phone]  If 7PM-7AM, please contact night-coverage [email or web address] Password [email or web address]  08/31/2017, 2:20 AM

## 2017-08-31 NOTE — Progress Notes (Signed)
Call report to [facility] [phone] @ [phone]

## 2017-08-31 NOTE — Progress Notes (Signed)
Subjective: Patient admitted this morning, see detailed H&P by Dr Myna Hidalgo a 83 y.o. male with medical history significant for prostate cancer, depression with anxiety, chronic pain, and myelodysplastic syndrome followed by oncology and under treatment with chemotherapy, now presenting to the emergency department from his SNF for evaluation of gross hematuria.     Chemistry panel is largely unremarkable and CBC is notable for pancytopenia with WBC 2300, hemoglobin 8.0, and platelets 15,000.  Type and screen was performed in the ED, continuous bladder irrigation was initiated, and 1 unit of platelets were ordered for immediate transfusion.    Vitals:   08/31/17 0653 08/31/17 0845  BP: (!) 108/58 (!) 104/57  Pulse: 77 77  Resp: 16 14  Temp: 98 F (36.7 C) 98.1 F (36.7 C)  SpO2: 97%       A/P Hematuria Thrombocytopenia Chronic pain Depression/anxiety UTI  Hematuria-secondary to low platelet count, I called and discussed with oncologist Dr. Alvy Bimler and Dr. Irene Limbo patient received 1 unit of platelets this morning, repeat CBC showed WBCs 1.8, platelets 40,000.  Continue continuous bladder irrigation.  Thrombocytopenia-platelets improved after 1 units of platelets given as above.  Follow CBC in a.m.  UTI-patient was recently diagnosed with UTI, was started on Keflex as outpatient.  Continue Keflex at this time.  Pancytopenia/MDS-WBC 1.8 now.  Followed by oncology as outpatient, under treatment with Vida isza.  If patient's platelet counts and WBCs again dropping next 2-3 days.  Consider palliative care consultation.  Oncology Dr Irene Limbo had recommended hospice as outpatient.    [place] [facility] [phone]

## 2017-09-01 LAB — PREPARE PLATELET PHERESIS: UNIT DIVISION: 0

## 2017-09-01 LAB — COMPREHENSIVE METABOLIC PANEL
ALBUMIN: 3 g/dL — AB (ref 3.5–5.0)
ALK PHOS: 47 U/L (ref 38–126)
ALT: 20 U/L (ref 17–63)
ANION GAP: 9 (ref 5–15)
AST: 16 U/L (ref 15–41)
BILIRUBIN TOTAL: 0.6 mg/dL (ref 0.3–1.2)
BUN: 22 mg/dL — AB (ref 6–20)
CALCIUM: 8.5 mg/dL — AB (ref 8.9–10.3)
CO2: 25 mmol/L (ref 22–32)
CREATININE: 1.03 mg/dL (ref 0.61–1.24)
Chloride: 106 mmol/L (ref 101–111)
GFR calc Af Amer: >60 mL/min (ref >60)
GFR calc non Af Amer: >60 mL/min (ref >60)
GLUCOSE: 117 mg/dL — AB (ref 65–99)
Potassium: 4 mmol/L (ref 3.5–5.1)
SODIUM: 140 mmol/L (ref 135–145)
TOTAL PROTEIN: 6.1 g/dL — AB (ref 6.5–8.1)

## 2017-09-01 LAB — URINE CULTURE: Culture: <10000 — AB

## 2017-09-01 LAB — BPAM PLATELET PHERESIS
Blood Product Expiration Date: 201811082359
ISSUE DATE / TIME: 201811060627
UNIT TYPE AND RH: 7300

## 2017-09-01 LAB — CBC
HEMATOCRIT: 22.8 % — AB (ref 39.0–52.0)
HEMOGLOBIN: 7.5 g/dL — AB (ref 13.0–17.0)
MCH: 29.5 pg (ref 26.0–34.0)
MCHC: 32.9 g/dL (ref 30.0–36.0)
MCV: 89.8 fL (ref 78.0–100.0)
Platelets: 37 10*3/uL — ABNORMAL LOW (ref 150–400)
RBC: 2.54 MIL/uL — AB (ref 4.22–5.81)
RDW: 17.5 % — ABNORMAL HIGH (ref 11.5–15.5)
WBC: 1.9 10*3/uL — ABNORMAL LOW (ref 4.0–10.5)

## 2017-09-01 LAB — PREPARE RBC (CROSSMATCH)

## 2017-09-01 MED ORDER — SODIUM CHLORIDE 0.9 % IV SOLN: Freq: Once | INTRAVENOUS | Status: DC

## 2017-09-01 NOTE — Progress Notes (Signed)
PROGRESS NOTE    Jordan Terry  HAL:[id number] DOB: Jun 14, 1932 DOA: 08/30/2017 PCP: [name], MD    Brief Narrative:  Jordan Terry is a 83 y.o. male with medical history significant for prostate cancer, depression with anxiety, chronic pain, and myelodysplastic syndrome followed by oncology and under treatment with chemotherapy, now presenting to the emergency department from his SNF for evaluation of gross hematuria.      Assessment & Plan:   Principal Problem:   Gross hematuria Active Problems:   Prostate cancer ([place])   Hematuria   Pancytopenia ([facility])   Myelodysplastic syndrome ([facility])   Gross hematuria:  Unclear etiology, no source of infection found,  Possibly from thrombocytopenia.  On CBI,  Urology consulted for recommendations.  1 unit of platelets given on 11/6 without much improvement in the counts.    MDS With pancytopenia:  S/p I unit of platelets.  Will discuss with oncology in am regarding his counts.    Chronic pain syndrome:  Resume home pain meds.     Abnormal UA.  CULTURES show insignificant growth.   p   DVT prophylaxis: scd,s Code Status: full code.  Family Communication: none at bedside.  Disposition Plan: pending further eval.   Consultants:  Urology.  Palliative care.    Procedures: has CBI running.   Antimicrobials:none.   Subjective: No new complaints.   Objective: Vitals:   08/31/17 1955 09/01/17 0500 09/01/17 0545 09/01/17 1303  BP: (!) 103/53  (!) 116/55 118/61  Pulse: 89  81 77  Resp: 15  18   Temp: 98.3 F (36.8 C)  (!) 97.5 F (36.4 C) 98.2 F (36.8 C)  TempSrc: Oral  Oral Oral  SpO2: 98%  100% 98%  Weight:  66.3 kg (146 lb 2.6 oz)    Height:        Intake/Output Summary (Last 24 hours) at 09/01/2017 1749 Last data filed at 09/01/2017 1548 Gross per 24 hour  Intake 13020 ml  Output 21950 ml  Net -8930 ml   Filed Weights   08/31/17 0419 09/01/17 0500  Weight: 66.1 kg (145 lb 11.6 oz) 66.3 kg  (146 lb 2.6 oz)    Examination:  General exam: Appears calm and comfortable  Respiratory system: Clear to auscultation. Respiratory effort normal. Cardiovascular system: S1 & S2 heard, RRR. No JVD, murmurs, rubs, gallops or clicks. No pedal edema. Gastrointestinal system: Abdomen is nondistended, soft and nontender. No organomegaly or masses felt. Normal bowel sounds heard. Central nervous system: Alert and very HARD of hearing. Able to move all extremities.  Extremities: trace pedal edema.  Skin: No rashes, lesions or ulcers Psychiatry:  Mood & affect appropriate.     Data Reviewed: I have personally reviewed following labs and imaging studies  CBC: Recent Labs  Lab 08/27/17 0503 08/31/17 0038 08/31/17 1107 09/01/17 0512  WBC 2.0* 2.3* 1.8* 1.9*  NEUTROABS  --  1.1*  --   --   HGB 8.7* 8.0* 7.5* 7.5*  HCT 26.3* 23.9* 22.5* 22.8*  MCV 88.3 88.5 89.6 89.8  PLT 23* 15* 40* 37*   Basic Metabolic Panel: Recent Labs  Lab 08/31/17 0038 08/31/17 0514 09/01/17 0512  NA 138 140 140  K 4.0 3.7 4.0  CL 103 104 106  CO2 26 26 25   GLUCOSE 122* 119* 117*  BUN 22* 21* 22*  CREATININE 1.07 1.02 1.03  CALCIUM 8.7* 8.7* 8.5*   GFR: Estimated Creatinine Clearance: 51.9 mL/min (by C-G formula based on SCr of 1.03 mg/dL).  Liver Function Tests: Recent Labs  Lab 09/01/17 0512  AST 16  ALT 20  ALKPHOS 47  BILITOT 0.6  PROT 6.1*  ALBUMIN 3.0*   No results for input(s): LIPASE, AMYLASE in the last 168 hours. No results for input(s): AMMONIA in the last 168 hours. Coagulation Profile: No results for input(s): INR, PROTIME in the last 168 hours. Cardiac Enzymes: No results for input(s): CKTOTAL, CKMB, CKMBINDEX, TROPONINI in the last 168 hours. BNP (last 3 results) No results for input(s): PROBNP in the last 8760 hours. HbA1C: No results for input(s): HGBA1C in the last 72 hours. CBG: No results for input(s): GLUCAP in the last 168 hours. Lipid Profile: No results for  input(s): CHOL, HDL, LDLCALC, TRIG, CHOLHDL, LDLDIRECT in the last 72 hours. Thyroid Function Tests: No results for input(s): TSH, T4TOTAL, FREET4, T3FREE, THYROIDAB in the last 72 hours. Anemia Panel: No results for input(s): VITAMINB12, FOLATE, FERRITIN, TIBC, IRON, RETICCTPCT in the last 72 hours. Sepsis Labs: No results for input(s): PROCALCITON, LATICACIDVEN in the last 168 hours.  Recent Results (from the past 240 hour(s))  MRSA PCR Screening     Status: None   Collection Time: 08/22/17  9:58 PM  Result Value Ref Range Status   MRSA by PCR NEGATIVE NEGATIVE Final    Comment:        The GeneXpert MRSA Assay (FDA approved for NASAL specimens only), is one component of a comprehensive MRSA colonization surveillance program. It is not intended to diagnose MRSA infection nor to guide or monitor treatment for MRSA infections.   Urine culture     Status: Abnormal   Collection Time: 08/31/17  1:27 AM  Result Value Ref Range Status   Specimen Description URINE, CATHETERIZED  Final   Special Requests NONE  Final   Culture (A)  Final    <10,000 COLONIES/mL INSIGNIFICANT GROWTH Performed at [facility], [address] [place], [place] [address]    Report Status 09/01/2017 FINAL  Final         Radiology Studies: No results found.      Scheduled Meds: . acyclovir  400 mg Oral Daily  . fluconazole  200 mg Oral Daily  . gabapentin  200 mg Oral TID  . LORazepam  1 mg Oral QHS  . nortriptyline  25 mg Oral QHS  . oxybutynin  5 mg Oral TID  . polyethylene glycol  17 g Oral BID  . QUEtiapine  25 mg Oral QHS  . senna-docusate  2 tablet Oral BID  . triamcinolone cream  1 application Topical q morning - 10a   Continuous Infusions:   LOS: 1 day    Time spent: 35 minutes.     Hosie Poisson, MD [facility] Pager [phone]   If 7PM-7AM, please contact night-coverage [email or web address] Password [facility] 09/01/2017, 5:49 PM

## 2017-09-01 NOTE — Evaluation (Signed)
Physical Therapy Evaluation Patient Details Name: Jordan Terry MRN: [id number] DOB: 03/24/1932 Today's Date: 09/01/2017   History of Present Illness  Jordan Terry is a 83 y.o. male with medical history significant for prostate cancer, depression with anxiety, chronic pain, vertigo, Grover's disease and myelodysplastic syndrome followed by oncology and under treatment with chemotherapy.  Admitted with hematuria.  Clinical Impression  Patient presents with decreased mobility due to weakness, decreased balance, decreased safety awareness, poor activity tolerance and will benefit from skilled PT in the acute setting prior to return to SNF level rehab at d/c.  Currently min A +2 for safety for mobility.     Follow Up Recommendations SNF(for return to [facility])    Equipment Recommendations  None recommended by PT    Recommendations for Other Services       Precautions / Restrictions Precautions Precautions: Fall      Mobility  Bed Mobility Overal bed mobility: Needs Assistance Bed Mobility: Supine to Sit     Supine to sit: HOB elevated;Min assist;+2 for safety/equipment     General bed mobility comments: assist to bring hips to EOB  Transfers Overall transfer level: Needs assistance Equipment used: Rolling walker (2 wheeled) Transfers: Sit to/from Stand Sit to Stand: Min assist;+2 safety/equipment         General transfer comment: assist to rise, steady with cues for hand placement  Ambulation/Gait Ambulation/Gait assistance: Min assist;+2 safety/equipment Ambulation Distance (Feet): 200 Feet Assistive device: Rolling walker (2 wheeled) Gait Pattern/deviations: Step-through pattern;Decreased stride length;Trunk flexed     General Gait Details: assist for walker, cues for posture  Stairs            Wheelchair Mobility    Modified Rankin (Stroke Patients Only)       Balance Overall balance assessment: Needs assistance Sitting-balance support: No  upper extremity supported;Feet supported Sitting balance-Leahy Scale: Fair     Standing balance support: Bilateral upper extremity supported Standing balance-Leahy Scale: Poor Standing balance comment: UE support for balance                             Pertinent Vitals/Pain Pain Score: 2  Pain Location: penis Pain Descriptors / Indicators: Sore Pain Intervention(s): Monitored during session    Home Living Family/patient expects to be discharged to:: Skilled nursing facility(was at [facility] since Friday) Living Arrangements: Spouse/significant other Available Help at Discharge: Family;Available 24 hours/day Type of Home: House Home Access: Stairs to enter Entrance Stairs-Rails: Left Entrance Stairs-Number of Steps: 2 Home Layout: One level Home Equipment: Cane - single point;Grab bars - toilet;Grab bars - tub/shower      Prior Function Level of Independence: Independent         Comments: has cane but doesn't use it, daughter drives pt to appointments     Hand Dominance        Extremity/Trunk Assessment   Upper Extremity Assessment Upper Extremity Assessment: Generalized weakness    Lower Extremity Assessment Lower Extremity Assessment: Generalized weakness    Cervical / Trunk Assessment Cervical / Trunk Assessment: Kyphotic  Communication   Communication: HOH  Cognition Arousal/Alertness: Awake/alert Behavior During Therapy: WFL for tasks assessed/performed Overall Cognitive Status: Impaired/Different from baseline Area of Impairment: Orientation;Memory;Problem solving                 Orientation Level: Disoriented to;Place;Time   Memory: Decreased short-term memory       Problem Solving: Slow processing;Requires verbal cues  General Comments General comments (skin integrity, edema, etc.): no c/o dizziness/lightheadedness    Exercises     Assessment/Plan    PT Assessment Patient needs continued PT services  PT  Problem List Decreased strength;Decreased mobility;Decreased safety awareness;Decreased balance;Decreased knowledge of use of DME;Decreased cognition;Decreased activity tolerance       PT Treatment Interventions DME instruction;Therapeutic activities;Gait training;Therapeutic exercise;Patient/family education;Balance training;Functional mobility training    PT Goals (Current goals can be found in the Care Plan section)  Acute Rehab PT Goals Patient Stated Goal: To return to rehab PT Goal Formulation: With patient/family Time For Goal Achievement: 09/15/17 Potential to Achieve Goals: Good    Frequency Min 3X/week   Barriers to discharge        Co-evaluation               AM-PAC PT "6 Clicks" Daily Activity  Outcome Measure Difficulty turning over in bed (including adjusting bedclothes, sheets and blankets)?: A Little Difficulty moving from lying on back to sitting on the side of the bed? : Unable Difficulty sitting down on and standing up from a chair with arms (e.g., wheelchair, bedside commode, etc,.)?: Unable Help needed moving to and from a bed to chair (including a wheelchair)?: A Little Help needed walking in hospital room?: A Little Help needed climbing 3-5 steps with a railing? : A Little 6 Click Score: 14    End of Session Equipment Utilized During Treatment: Gait belt Activity Tolerance: Patient tolerated treatment well Patient left: in chair;with family/visitor present Nurse Communication: Other (comment)(no chair alarm box in room) PT Visit Diagnosis: Muscle weakness (generalized) (M62.81);Other abnormalities of gait and mobility (R26.89);Unsteadiness on feet (R26.81)    Time: 4163-8453 PT Time Calculation (min) (ACUTE ONLY): 26 min   Charges:   PT Evaluation $PT Eval Moderate Complexity: 1 Mod PT Treatments $Gait Training: 8-22 mins   PT G CodesMagda Kiel, [place] [phone] 09/01/2017   Reginia Naas 09/01/2017, 4:09 PM

## 2017-09-01 NOTE — Clinical Social Work Note (Signed)
Clinical Social Work Assessment  Patient Details  Name: Jordan Terry MRN: [id number] Date of Birth: [birth year]  Date of referral:  09/01/17               Reason for consult:  Facility Placement, Discharge Planning                Permission sought to share information with:  Facility Art therapist granted to share information::  Yes, Verbal Permission Granted  Name::        Agency::     Relationship::     Contact Information:     Housing/Transportation Living arrangements for the past 2 months:  [place], Single Family Home(Patient recently discharged on 08/27/2017 to [facility] SNF for rehab) Source of Information:  Adult Children Patient Interpreter Needed:  None Criminal Activity/Legal Involvement Pertinent to Current Situation/Hospitalization:  No - Comment as needed Significant Relationships:  Adult Children, Spouse Lives with:  Spouse Do you feel safe going back to the place where you live?  No Need for family participation in patient care:  Yes (Comment)  Care giving concerns:  Patient recently discharged on 08/27/2017 to [facility] for [place] rehab. Patient resides at home with wife.    Social Worker assessment / plan:  Patient recently assessed on 08/26/2017, no psychosocial changes. Please see clinical social work assessment from 08/26/2017.  CSW spoke with patient's daughter [name] [phone]) and discussed patient's discharge plans. Patient's daughter reported that the plan is for patient to return to [facility]. Patient's daughter reported that patient received PT at SNF on Saturday, Sunday and Monday and she feels that patient will continue to need additional PT at SNF.   PT consult pending.  CSW will complete FL2 and follow up with [facility] SNF about patient's return to complete ST rehab.   Employment status:  Retired Nurse, adult PT Recommendations:  Not assessed at this  time [place] / Referral to community resources:  [place]  Patient/Family's Response to care:  Patient's family agreeable to patient returning to SNF for [place] rehab at [facility]   Patient/Family's Understanding of and Emotional Response to Diagnosis, Current Treatment, and Prognosis:  Patient oriented to person and place, unable to participate in assessment. Patient's daughter verbalized understanding of patient's diagnosis and current treatment. Patient's daughter very involved in patient's care.   Emotional Assessment Appearance:  Appears stated age Attitude/Demeanor/Rapport:  Unable to Assess Affect (typically observed):  Unable to Assess Orientation:  Oriented to Place, Oriented to Self Alcohol / Substance use:  Not Applicable Psych involvement (Current and /or in the community):  No (Comment)  Discharge Needs  Concerns to be addressed:  Care Coordination, Discharge Planning Concerns Readmission within the last 30 days:  Yes Current discharge risk:  Cognitively Impaired, Physical Impairment Barriers to Discharge:  Continued Medical Work up   [employer], LCSW 09/01/2017, 9:53 AM

## 2017-09-01 NOTE — Consult Note (Signed)
Urology Consult   Physician requesting consult: Dr. Karleen Hampshire  Reason for consult: Hematuria  History of Present Illness: Jordan Terry is a 83 y.o. with a history of prostate cancer s/p radiation therapy with urethral stricture disease managed with intermittent catheterization.  He was recently admitted (discharged on 11/2) with hematuria and thrombocytopenia requiring platelet transfusion.  His hematuria cleared with CBI and conservative therapy.  He was discharged last week with clear urine and his catheter to drainage.  He presented back to the ED last night from his SNF with hematuria.  It is unclear if the patient had pulled on his catheter (although he did do this in the hospital when he became confused). He was presumably started on CBI by the ED (???) 24 hours ago.     Past Medical History:  Diagnosis Date  . Anginal pain ([place]) 20 years ago  . Anxiety   . Arthritis   . Bilateral tinnitus    wears hearing aides  . Cancer [facility])    prostate  . Grover's disease   . Headache(784.0)    not since retired  . Hematuria 04/14/2017  . Pancytopenia ([place]) 04/19/2017  . Self-catheterizes urinary bladder 04/19/2017  . Vertigo     Past Surgical History:  Procedure Laterality Date  . CARDIAC CATHETERIZATION  "20 years ago"  . CARPAL TUNNEL RELEASE Left 2008  . CATARACT EXTRACTION Bilateral 2014  . [place]   "scope"  . NOSE SURGERY  1991, 1998  . RADIOACTIVE SEED IMPLANT  2002   "for prostate cancer"  . SHOULDER SURGERY  2012   right  . THYROIDECTOMY, PARTIAL  1974  . VASECTOMY  1970    Medications:  Home meds:  Current Meds  Medication Sig  . acyclovir (ZOVIRAX) 400 MG tablet Take 1 tablet (400 mg total) by mouth daily.  . fluconazole (DIFLUCAN) 200 MG tablet Take 1 tablet (200 mg total) by mouth daily.  Marland Kitchen gabapentin (NEURONTIN) 100 MG capsule Take 200 mg by mouth 3 (three) times daily.   Marland Kitchen LORazepam (ATIVAN) 0.5 MG tablet Take 1 mg by mouth at bedtime.  .  nortriptyline (PAMELOR) 25 MG capsule Take 1 capsule (25 mg total) by mouth at bedtime.  . ondansetron (ZOFRAN) 8 MG tablet Take 1 tablet (8 mg total) by mouth 2 (two) times daily as needed (Nausea or vomiting).  Marland Kitchen oxybutynin (DITROPAN) 5 MG tablet Take 5 mg by mouth 3 (three) times daily.   Marland Kitchen oxyCODONE-acetaminophen (PERCOCET) 7.5-325 MG tablet Take 0.5-1 tablets by mouth every 8 (eight) hours as needed (pain.). (Patient taking differently: Take 1 tablet every 8 (eight) hours as needed by mouth (pain). )  . polyethylene glycol (MIRALAX / GLYCOLAX) packet Take 17 g by mouth 2 (two) times daily.  . QUEtiapine (SEROQUEL) 25 MG tablet Take 1 tablet (25 mg total) by mouth at bedtime.  . senna-docusate (SENOKOT-S) 8.6-50 MG tablet Take 2 tablets by mouth 2 (two) times daily.  Marland Kitchen triamcinolone cream (KENALOG) 0.1 % Apply 1 application topically every morning. APPLY TO CHEST AND BACK EVERY MORNING FOR GROVER DISEASE  . [DISCONTINUED] prochlorperazine (COMPAZINE) 10 MG tablet Take 1 tablet (10 mg total) by mouth every 6 (six) hours as needed (Nausea or vomiting).    Scheduled Meds: . acyclovir  400 mg Oral Daily  . fluconazole  200 mg Oral Daily  . gabapentin  200 mg Oral TID  . LORazepam  1 mg Oral QHS  . nortriptyline  25 mg Oral QHS  .  oxybutynin  5 mg Oral TID  . polyethylene glycol  17 g Oral BID  . QUEtiapine  25 mg Oral QHS  . senna-docusate  2 tablet Oral BID  . triamcinolone cream  1 application Topical q morning - 10a   Continuous Infusions: PRN Meds:.acetaminophen **OR** acetaminophen, ondansetron **OR** ondansetron (ZOFRAN) IV, oxyCODONE-acetaminophen  Allergies:  Allergies  Allergen Reactions  . Benadryl [Diphenhydramine Hcl] Anxiety    MAKES HIM GO CRAZY  . Hydrocodone Itching    Small amounts are okay    Family History  Problem Relation Age of Onset  . Anxiety disorder Mother   . Cancer Father        colon, lung  . Diabetes Brother   . Cancer Brother   . Diabetes  Sister   . Leukemia Sister   . Cancer Sister        breast  . Cancer Sister        breast  . Cancer Sister        thyroid  . Diabetes Sister     Social History:  reports that he quit smoking about 39 years ago. His smoking use included cigars. He quit after 10.00 years of use. he has never used smokeless tobacco. He reports that he does not drink alcohol or use drugs.  ROS: A complete review of systems was performed.  All systems are negative except for pertinent findings as noted.  Physical Exam:  Vital signs in last 24 hours: Temp:  [97.5 F (36.4 C)-98.3 F (36.8 C)] 98.2 F (36.8 C) (11/07 1303) Pulse Rate:  [77-89] 77 (11/07 1303) Resp:  [15-18] 18 (11/07 0545) BP: (103-118)/(53-61) 118/61 (11/07 1303) SpO2:  [98 %-100 %] 98 % (11/07 1303) Weight:  [66.3 kg (146 lb 2.6 oz)] 66.3 kg (146 lb 2.6 oz) (11/07 0500) Constitutional:  Alert and oriented, No acute distress Cardiovascular: No JVD Respiratory: Normal respiratory effort GI: Abdomen is soft, nontender, nondistended, no abdominal masses Genitourinary: 3 way catheter in place.  CBI on moderate drip with urine mostly clear. Lymphatic: No lymphadenopathy Neurologic: Grossly intact, no focal deficits Psychiatric: Normal mood and affect  Laboratory Data:  Recent Labs    08/31/17 0038 08/31/17 1107 09/01/17 0512  WBC 2.3* 1.8* 1.9*  HGB 8.0* 7.5* 7.5*  HCT 23.9* 22.5* 22.8*  PLT 15* 40* 37*    Recent Labs    08/31/17 0038 08/31/17 0514 09/01/17 0512  NA 138 140 140  K 4.0 3.7 4.0  CL 103 104 106  GLUCOSE 122* 119* 117*  BUN 22* 21* 22*  CALCIUM 8.7* 8.7* 8.5*  CREATININE 1.07 1.02 1.03     Results for orders placed or performed during the hospital encounter of 08/30/17 (from the past 24 hour(s))  CBC     Status: Abnormal   Collection Time: 09/01/17  5:12 AM  Result Value Ref Range   WBC 1.9 (L) 4.0 - 10.5 K/uL   RBC 2.54 (L) 4.22 - 5.81 MIL/uL   Hemoglobin 7.5 (L) 13.0 - 17.0 g/dL   HCT 22.8 (L)  39.0 - 52.0 %   MCV 89.8 78.0 - 100.0 fL   MCH 29.5 26.0 - 34.0 pg   MCHC 32.9 30.0 - 36.0 g/dL   RDW 17.5 (H) 11.5 - 15.5 %   Platelets 37 (L) 150 - 400 K/uL  Comprehensive metabolic panel     Status: Abnormal   Collection Time: 09/01/17  5:12 AM  Result Value Ref Range   Sodium 140 135 - 145  mmol/L   Potassium 4.0 3.5 - 5.1 mmol/L   Chloride 106 101 - 111 mmol/L   CO2 25 22 - 32 mmol/L   Glucose, Bld 117 (H) 65 - 99 mg/dL   BUN 22 (H) 6 - 20 mg/dL   Creatinine, Ser 1.03 0.61 - 1.24 mg/dL   Calcium 8.5 (L) 8.9 - 10.3 mg/dL   Total Protein 6.1 (L) 6.5 - 8.1 g/dL   Albumin 3.0 (L) 3.5 - 5.0 g/dL   AST 16 15 - 41 U/L   ALT 20 17 - 63 U/L   Alkaline Phosphatase 47 38 - 126 U/L   Total Bilirubin 0.6 0.3 - 1.2 mg/dL   GFR calc non Af Amer >60 >60 mL/min   GFR calc Af Amer >60 >60 mL/min   Anion gap 9 5 - 15   Recent Results (from the past 240 hour(s))  MRSA PCR Screening     Status: None   Collection Time: 08/22/17  9:58 PM  Result Value Ref Range Status   MRSA by PCR NEGATIVE NEGATIVE Final    Comment:        The GeneXpert MRSA Assay (FDA approved for NASAL specimens only), is one component of a comprehensive MRSA colonization surveillance program. It is not intended to diagnose MRSA infection nor to guide or monitor treatment for MRSA infections.   Urine culture     Status: Abnormal   Collection Time: 08/31/17  1:27 AM  Result Value Ref Range Status   Specimen Description URINE, CATHETERIZED  Final   Special Requests NONE  Final   Culture (A)  Final    <10,000 COLONIES/mL INSIGNIFICANT GROWTH Performed at [facility], [address], [place], [place] [address]    Report Status 09/01/2017 FINAL  Final    Renal Function: Recent Labs    08/31/17 0038 08/31/17 0514 09/01/17 0512  CREATININE 1.07 1.02 1.03   Estimated Creatinine Clearance: 51.9 mL/min (by C-G formula based on SCr of 1.03 mg/dL).  Radiologic Imaging: No results found.  I independently  reviewed the above imaging studies.  Impression/Recommendation 1) Hematuria: Likely related to catheter trauma and severe thrombocytopenia (PLT 15,000 on admission).  Transfuse platelets as needed and continue catheter with CBI for now.   Jaquawn Saffran,LES 09/01/2017, 5:50 PM    Pryor Curia MD  CC: Dr. Karleen Hampshire

## 2017-09-02 DIAGNOSIS — Z7189 Other specified counseling: Secondary | ICD-10-CM

## 2017-09-02 DIAGNOSIS — Z515 Encounter for palliative care: Secondary | ICD-10-CM

## 2017-09-02 LAB — CBC
HCT: 24.7 % — ABNORMAL LOW (ref 39.0–52.0)
HEMOGLOBIN: 8.1 g/dL — AB (ref 13.0–17.0)
MCH: 29.2 pg (ref 26.0–34.0)
MCHC: 32.8 g/dL (ref 30.0–36.0)
MCV: 89.2 fL (ref 78.0–100.0)
Platelets: 33 10*3/uL — ABNORMAL LOW (ref 150–400)
RBC: 2.77 MIL/uL — AB (ref 4.22–5.81)
RDW: 16.5 % — ABNORMAL HIGH (ref 11.5–15.5)
WBC: 2.1 10*3/uL — ABNORMAL LOW (ref 4.0–10.5)

## 2017-09-02 LAB — BPAM RBC
BLOOD PRODUCT EXPIRATION DATE: 201811272359
ISSUE DATE / TIME: 201811072042
Unit Type and Rh: 6200

## 2017-09-02 LAB — DIFFERENTIAL
Basophils Absolute: 0 10*3/uL (ref 0.0–0.1)
Basophils Relative: 1 %
EOS PCT: 1 %
Eosinophils Absolute: 0 10*3/uL (ref 0.0–0.7)
LYMPHS ABS: 1.1 10*3/uL (ref 0.7–4.0)
Lymphocytes Relative: 54 %
MONOS PCT: 3 %
Monocytes Absolute: 0.1 10*3/uL (ref 0.1–1.0)
NEUTROS ABS: 0.9 10*3/uL — AB (ref 1.7–7.7)
Neutrophils Relative %: 41 %

## 2017-09-02 LAB — TYPE AND SCREEN
ABO/RH(D): A POS
Antibody Screen: NEGATIVE
Unit division: 0

## 2017-09-02 NOTE — Consult Note (Signed)
Consultation Note Date: 09/02/2017   Patient Name: Jordan Terry  DOB: 1932-06-21  MRN: [id number]  Age / Sex: 83 y.o., male  PCP: Jordan Battles, MD Referring Physician: Hosie Poisson, MD  Reason for Consultation: Establishing goals of care  HPI/Patient Profile: 83 y.o. male  with past medical history of prostate cancer s/p radiation therapy with urethral stricture requiring intermittent catheterization, myelodysplastic syndrome currently receiving chemotherapy, Grover's disease, arthritis, anxiety, depression, and angina admitted on 08/30/2017 with gross hematuria. Recent hospitalization 10/28-11/2 secondary to hematuria and thrombocytopenia requiring platelet transfusion. Hematuria cleared with continuous bladder irrigation. In ED, patient with hematuria possibly from thrombocytopenia (15,000). CBI started and patient received 1 unit of platelets. Followed by oncology outpatient for MDS with pancytopenia currently receiving chemotherapy/transfusions as needed. Notes reviewed. No source of infection found. Palliative medicine consultation for goals of care. Recently seen by Jordan Terry, Palliative MD on 08/26/17.   Clinical Assessment and Goals of Care: I have reviewed medical records, discussed with care team, and met with patient, wife (Jordan Terry), and Daughter Jordan Terry) at bedside to discuss diagnosis, [place], EOL wishes, disposition and options. Patient is awake, alert, oriented, and able to participate in [place] conversation.   Introduced Palliative Medicine as specialized medical care for people living with serious illness. It focuses on providing relief from the symptoms and stress of a serious illness. The goal is to improve quality of life for both the patient and the family.  We discussed a brief life review of the patient. Married to wife for 2 years. They have 3 children. Prior to last hospitalization, patient  living home with wife and independent of ADL's including driving to appointments. (They have tried to maintain his independence). MDS diagnosis in June. He has been receiving monthly chemotherapy injections and transfusions as needed. Wife speaks of him becoming weaker secondary to chemotherapy. Good appetite.   Discussed hospital diagnoses and interventions. Wife asks if there is a "fix" for the hematuria. Explained hematuria secondary to thrombocytopenia and concern for continued bleeding requiring hospitalization/transfusions due to progressing MDS. Wife and daughter seem to have a good understanding of this.   At one point in the conversation, Jordan Terry tells his wife he is "done with injections." When Jordan Terry clarified his wishes regarding continued chemotherapy, Jordan Terry tells [place] he plans to receive the injection next week. Jordan Terry requesting to speak with Dr. Irene Terry when available to help guide decision making.   Advanced directives, concepts specific to code status, artifical feeding and hydration, and rehospitalization were considered and discussed. Jordan Terry is documented HCPOA but the patient does not have a living will. Introduced MOST form and discussed big decisions they are faced with due to progressive MDS including resuscitation, life support, feeding tube, etc. Educated on medical recommendation for DNR/DNI with age, MDS/chronic co-morbidities, and poor outcomes of CPR.   Jordan Terry is appreciative of the information but does not make any decisions today. Encouraged him to continue conversations with wife and daughter regarding EOL wishes.   We briefly discussed quality of life  versus quantity. I did explain role of hospice services for supportive care/comfort care if he no longer wishes to receive chemotherapy injections. Jordan Terry agreeable with palliative services to continue to follow at rehab facility. Back home is not an option at this point. Wife is frail and unable to care for Jordan Terry needs.    Questions and concerns addressed. Therapeutic listening as Jordan Terry shares his interest in coin and shot gun collections.    SUMMARY OF RECOMMENDATIONS    Continue FULL code/FULL scope treatment  Oncology consult placed. Daughter requesting to speak with Dr. Irene Terry when available.   Provided MOST form and Hard Choices copy. Encourage patient/family to continue discussions regarding EOL wishes.   Would benefit from palliative services to follow outpatient at SNF.   Not ready for hospice services. Patient/family hopeful to continue chemotherapy if recommended by oncology.   Code Status/Advance Care Planning:  Full code-educated on medical recommendation for DNR/DNI with age/chronic co-morbidities/poor outcomes of CPR.  Symptom Management:   Per attending  Palliative Prophylaxis:   Aspiration, Delirium Protocol, Frequent Pain Assessment, Oral Care and Turn Reposition  Additional Recommendations (Limitations, Scope, Preferences):  Full Scope Treatment  Psycho-social/Spiritual:   Desire for further Chaplaincy support: yes  Additional Recommendations: Caregiving  Support/Resources and Education on Hospice  Prognosis:   Guarded secondary to MDS and pancytopenia. Recurrent hospitalizations.  Discharge Planning: To Be Determined      Primary Diagnoses: Present on Admission: . Prostate cancer ([place]) . Pancytopenia ([place]) . Gross hematuria . Myelodysplastic syndrome ([place]) . Hematuria   I have reviewed the medical record, interviewed the patient and family, and examined the patient. The following aspects are pertinent.  Past Medical History:  Diagnosis Date  . Anginal pain ([place]) 20 years ago  . Anxiety   . Arthritis   . Bilateral tinnitus    wears hearing aides  . Cancer [facility])    prostate  . Grover's disease   . Headache(784.0)    not since retired  . Hematuria 04/14/2017  . Pancytopenia ([place]) 04/19/2017  . Self-catheterizes urinary bladder 04/19/2017  .  Vertigo    Social History   Socioeconomic History  . Marital status: Married    Spouse name: Jordan Terry  . Number of children: 3  . Years of education: college  . Highest education level: None  Social Needs  . Financial resource strain: None  . Food insecurity - worry: None  . Food insecurity - inability: None  . Transportation needs - medical: None  . Transportation needs - non-medical: None  Occupational History  . None  Tobacco Use  . Smoking status: Former Smoker    Years: 10.00    Types: Cigars    Last attempt to quit: 1979    Years since quitting: 39.8  . Smokeless tobacco: Never Used  Substance and Sexual Activity  . Alcohol use: No  . Drug use: No  . Sexual activity: None  Other Topics Concern  . None  Social History Narrative   Patient is married Animal nutritionist) and lives at home with his wife.   Patient has three children.   Patient is retired.   Patient has a college education in [place].   Patient is right-handed.   Patient drinks one cup of coffee daily and one 8 oz of tea daily.   Family History  Problem Relation Age of Onset  . Anxiety disorder Mother   . Cancer Father        colon, lung  . Diabetes Brother   .  Cancer Brother   . Diabetes Sister   . Leukemia Sister   . Cancer Sister        breast  . Cancer Sister        breast  . Cancer Sister        thyroid  . Diabetes Sister    Scheduled Meds: . acyclovir  400 mg Oral Daily  . fluconazole  200 mg Oral Daily  . gabapentin  200 mg Oral TID  . LORazepam  1 mg Oral QHS  . nortriptyline  25 mg Oral QHS  . oxybutynin  5 mg Oral TID  . polyethylene glycol  17 g Oral BID  . QUEtiapine  25 mg Oral QHS  . senna-docusate  2 tablet Oral BID  . triamcinolone cream  1 application Topical q morning - 10a   Continuous Infusions: . sodium chloride     PRN Meds:.acetaminophen **OR** acetaminophen, ondansetron **OR** ondansetron (ZOFRAN) IV, oxyCODONE-acetaminophen Medications Prior to Admission:  Prior to  Admission medications   Medication Sig Start Date End Date Taking? Authorizing Provider  acyclovir (ZOVIRAX) 400 MG tablet Take 1 tablet (400 mg total) by mouth daily. 08/10/17  Yes Brunetta Genera, MD  fluconazole (DIFLUCAN) 200 MG tablet Take 1 tablet (200 mg total) by mouth daily. 08/10/17  Yes Brunetta Genera, MD  gabapentin (NEURONTIN) 100 MG capsule Take 200 mg by mouth 3 (three) times daily.    Yes [provider]  LORazepam (ATIVAN) 0.5 MG tablet Take 1 mg by mouth at bedtime.   Yes [provider]  nortriptyline (PAMELOR) 25 MG capsule Take 1 capsule (25 mg total) by mouth at bedtime. 01/19/17  Yes Dennie Bible, NP  ondansetron (ZOFRAN) 8 MG tablet Take 1 tablet (8 mg total) by mouth 2 (two) times daily as needed (Nausea or vomiting). 05/17/17  Yes Brunetta Genera, MD  oxybutynin (DITROPAN) 5 MG tablet Take 5 mg by mouth 3 (three) times daily.  08/21/17  Yes [provider]  oxyCODONE-acetaminophen (PERCOCET) 7.5-325 MG tablet Take 0.5-1 tablets by mouth every 8 (eight) hours as needed (pain.). Patient taking differently: Take 1 tablet every 8 (eight) hours as needed by mouth (pain).  08/27/17  Yes Patrecia Pour, MD  polyethylene glycol (MIRALAX / GLYCOLAX) packet Take 17 g by mouth 2 (two) times daily. 07/14/17  Yes Brunetta Genera, MD  QUEtiapine (SEROQUEL) 25 MG tablet Take 1 tablet (25 mg total) by mouth at bedtime. 08/27/17  Yes Patrecia Pour, MD  senna-docusate (SENOKOT-S) 8.6-50 MG tablet Take 2 tablets by mouth 2 (two) times daily. 06/23/17  Yes Dessa Phi Chahn-Yang, DO  triamcinolone cream (KENALOG) 0.1 % Apply 1 application topically every morning. APPLY TO CHEST AND BACK EVERY MORNING FOR [place] DISEASE   Yes [provider]   Allergies  Allergen Reactions  . Benadryl [Diphenhydramine Hcl] Anxiety    MAKES HIM GO CRAZY  . Hydrocodone Itching    Small amounts are okay   Review of Systems  Constitutional:  Positive for activity change.  Respiratory: Positive for shortness of breath.   Neurological: Positive for weakness.   Physical Exam  Constitutional: He is cooperative.  HENT:  Head: Normocephalic and atraumatic.  Cardiovascular: Regular rhythm.  Pulmonary/Chest: Effort normal.  Genitourinary:  Genitourinary Comments: CBI  Neurological: He is alert.  Oriented to person and place. Intermittent confusion.  Skin: Skin is warm and dry. There is pallor.  Psychiatric: He has a normal mood and affect. His speech  is normal and behavior is normal.  Nursing note and vitals reviewed.  Vital Signs: BP (!) 130/55 (BP Location: Right Arm)   Pulse (!) 41   Temp 99 F (37.2 C) (Oral)   Resp 18   Ht '6\' 2"'  (1.88 m)   Wt 67.4 kg (148 lb 9.4 oz)   SpO2 97%   BMI 19.08 kg/m  Pain Assessment: 0-10   Pain Score: 0-No pain   SpO2: SpO2: 97 % O2 Device:SpO2: 97 % O2 Flow Rate: .   IO: Intake/output summary:   Intake/Output Summary (Last 24 hours) at 09/02/2017 1609 Last data filed at 09/02/2017 1300 Gross per 24 hour  Intake 10788 ml  Output 10850 ml  Net -62 ml    LBM: Last BM Date: 08/31/17 Baseline Weight: Weight: 66.1 kg (145 lb 11.6 oz) Most recent weight: Weight: 67.4 kg (148 lb 9.4 oz)     Palliative Assessment/Data: PPS 40%   Flowsheet Rows     Most Recent Value  Intake Tab  Referral Department  Hospitalist  Unit at Time of Referral  Cardiac/Telemetry Unit  Palliative Care Primary Diagnosis  Cancer  Palliative Care Type  Return patient Palliative Care  Date first seen by Palliative Care  09/02/17  Clinical Assessment  Palliative Performance Scale Score  40%  Psychosocial & Spiritual Assessment  Palliative Care Outcomes  Patient/Family meeting held?  Yes  Who was at the meeting?  patient, wife, daughter  Palliative Care Outcomes  Clarified goals of care, Counseled regarding hospice, Provided end of life care assistance, Provided psychosocial or spiritual support, ACP  counseling assistance, Linked to palliative care logitudinal support      Time In: 1400 Time Out: 1510 Time Total: 84mn Greater than 50%  of this time was spent counseling and coordinating care related to the above assessment and plan.  Signed by:  MIhor Dow FNP-C Palliative Medicine Team  Phone: [phone]: [phone]  Please contact Palliative Medicine Team phone at [phone] questions and concerns.  For individual provider: See AShea Evans

## 2017-09-02 NOTE — Progress Notes (Signed)
PROGRESS NOTE    Jordan Terry  HEN:[id number] DOB: [birth year] DOA: 08/30/2017 PCP: [name], MD    Brief Narrative:  Jordan Terry is a 83 y.o. male with medical history significant for prostate cancer, depression with anxiety, chronic pain, and myelodysplastic syndrome followed by oncology and under treatment with chemotherapy, now presenting to the emergency department from his SNF for evaluation of gross hematuria.      Assessment & Plan:   Principal Problem:   Gross hematuria Active Problems:   Prostate cancer ([place])   Hematuria   Pancytopenia ([place])   Myelodysplastic syndrome ([place])   Palliative care by specialist   Gross hematuria:  Unclear etiology, no source of infection found,  Possibly from thrombocytopenia.  On CBI,  Urology consulted for recommendations, to continue with CBI,  Dr Grier Mitts recommendations to transfuse if platelets less than 20,000 and if hemoglobin less than 8.  So far he has received 1 unit of platelets and 1 unit of prbc transfusion.  Hemoglobin stable around 8.  Will request Dr Irene Limbo for input.     MDS With pancytopenia:  S/p I unit of platelets. And one 1 unit of prbc transfusion.  Hemoglobin around 8 and platelets around 33, 000 and his urine is clearing up.      Chronic pain syndrome:  Resume home pain meds.     Abnormal UA.  CULTURES show insignificant growth.   In view of his repeated admissions with confusion, requested palliative care for goals of care.   DVT prophylaxis: scd,s Code Status: full code.  Family Communication: none at bedside.  Disposition Plan: pending further eval.   Consultants:  Urology.  Palliative care.    Procedures: has CBI running.   Antimicrobials:none.   Subjective: No new complaints.   Objective: Vitals:   09/02/17 0012 09/02/17 0525 09/02/17 0540 09/02/17 1404  BP: (!) 119/97 127/72 (!) 108/56 (!) 130/55  Pulse: 90 87 80 (!) 41  Resp: 20 16 18    Temp: 98.3 F (36.8 C)  98.3 F (36.8 C) 97.7 F (36.5 C) 99 F (37.2 C)  TempSrc: Oral Oral Oral Oral  SpO2: 100% 95% 100% 97%  Weight:   67.4 kg (148 lb 9.4 oz)   Height:        Intake/Output Summary (Last 24 hours) at 09/02/2017 1637 Last data filed at 09/02/2017 1300 Gross per 24 hour  Intake 10788 ml  Output 10850 ml  Net -62 ml   Filed Weights   08/31/17 0419 09/01/17 0500 09/02/17 0540  Weight: 66.1 kg (145 lb 11.6 oz) 66.3 kg (146 lb 2.6 oz) 67.4 kg (148 lb 9.4 oz)    Examination:  General exam: Appears calm and comfortable , not in  Distress.  Respiratory system: Clear to auscultation. Respiratory effort normal. No wheezing or rhonchi.  Cardiovascular system: S1 & S2 heard, RRR. No JVD, Gastrointestinal system: Abdomen is soft NT ND BS+ Central nervous system: Alert TODAY and appears to be oriented. Able to move all extremities.  Extremities: trace pedal edema.  Skin: No rashes, lesions or ulcers Psychiatry:  Mood & affect appropriate.     Data Reviewed: I have personally reviewed following labs and imaging studies  CBC: Recent Labs  Lab 08/27/17 0503 08/31/17 0038 08/31/17 1107 09/01/17 0512 09/02/17 0520  WBC 2.0* 2.3* 1.8* 1.9* 2.1*  NEUTROABS  --  1.1*  --   --  0.9*  HGB 8.7* 8.0* 7.5* 7.5* 8.1*  HCT 26.3* 23.9* 22.5* 22.8* 24.7*  MCV  88.3 88.5 89.6 89.8 89.2  PLT 23* 15* 40* 37* 33*   Basic Metabolic Panel: Recent Labs  Lab 08/31/17 0038 08/31/17 0514 09/01/17 0512  NA 138 140 140  K 4.0 3.7 4.0  CL 103 104 106  CO2 26 26 25   GLUCOSE 122* 119* 117*  BUN 22* 21* 22*  CREATININE 1.07 1.02 1.03  CALCIUM 8.7* 8.7* 8.5*   GFR: Estimated Creatinine Clearance: 52.7 mL/min (by C-G formula based on SCr of 1.03 mg/dL). Liver Function Tests: Recent Labs  Lab 09/01/17 0512  AST 16  ALT 20  ALKPHOS 47  BILITOT 0.6  PROT 6.1*  ALBUMIN 3.0*   No results for input(s): LIPASE, AMYLASE in the last 168 hours. No results for input(s): AMMONIA in the last 168  hours. Coagulation Profile: No results for input(s): INR, PROTIME in the last 168 hours. Cardiac Enzymes: No results for input(s): CKTOTAL, CKMB, CKMBINDEX, TROPONINI in the last 168 hours. BNP (last 3 results) No results for input(s): PROBNP in the last 8760 hours. HbA1C: No results for input(s): HGBA1C in the last 72 hours. CBG: No results for input(s): GLUCAP in the last 168 hours. Lipid Profile: No results for input(s): CHOL, HDL, LDLCALC, TRIG, CHOLHDL, LDLDIRECT in the last 72 hours. Thyroid Function Tests: No results for input(s): TSH, T4TOTAL, FREET4, T3FREE, THYROIDAB in the last 72 hours. Anemia Panel: No results for input(s): VITAMINB12, FOLATE, FERRITIN, TIBC, IRON, RETICCTPCT in the last 72 hours. Sepsis Labs: No results for input(s): PROCALCITON, LATICACIDVEN in the last 168 hours.  Recent Results (from the past 240 hour(s))  Urine culture     Status: Abnormal   Collection Time: 08/31/17  1:27 AM  Result Value Ref Range Status   Specimen Description URINE, CATHETERIZED  Final   Special Requests NONE  Final   Culture (A)  Final    <10,000 COLONIES/mL INSIGNIFICANT GROWTH Performed at [facility], [address] [place], [place] [address]    Report Status 09/01/2017 FINAL  Final         Radiology Studies: No results found.      Scheduled Meds: . acyclovir  400 mg Oral Daily  . fluconazole  200 mg Oral Daily  . gabapentin  200 mg Oral TID  . LORazepam  1 mg Oral QHS  . nortriptyline  25 mg Oral QHS  . oxybutynin  5 mg Oral TID  . polyethylene glycol  17 g Oral BID  . QUEtiapine  25 mg Oral QHS  . senna-docusate  2 tablet Oral BID  . triamcinolone cream  1 application Topical q morning - 10a   Continuous Infusions: . sodium chloride       LOS: 2 days    Time spent: 35 minutes.     Hosie Poisson, MD [facility] Pager [phone]   If 7PM-7AM, please contact night-coverage [email or web address] Password [facility] 09/02/2017, 4:37 PM

## 2017-09-02 NOTE — NC FL2 (Signed)
[place] LEVEL OF CARE SCREENING TOOL     IDENTIFICATION  Patient Name: Jordan Terry Birthdate: [birth year] Sex: male Admission Date (Current Location): 08/30/2017  [facility] and [place] Number:  Herbalist and Address:  [facility],  [place] [address] [place]      Provider Number: [id number]  Attending Physician Name and Address:  Hosie Poisson, MD  Relative Name and Phone Number:       Current Level of Care: Hospital Recommended Level of Care: [place] Prior Approval Number:    Date Approved/Denied:   PASRR Number: [id number]  Discharge Plan: SNF    Current Diagnoses: Patient Active Problem List   Diagnosis Date Noted  . E. coli UTI   . Encounter for palliative care   . Goals of care, counseling/discussion   . Obstruction of Foley catheter ([place])   . Acute blood loss anemia   . Counseling regarding advanced care planning and goals of care 08/05/2017  . Anemia 06/22/2017  . Constipation 06/22/2017  . Myelodysplastic syndrome ([place]) 05/07/2017  . Gross hematuria 04/26/2017  . 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 Dyspnea 04/26/2017  . NHL (non-Hodgkin's lymphoma) ([place])   . Urethral bleeding 04/19/2017  . Pancytopenia ([place]) 04/19/2017  . Sepsis ([place]) 03/09/2016  . Prostate cancer ([place]) 03/08/2016  . Hematuria 03/08/2016  . Fever and chills 03/08/2016  . Fever 03/08/2016  . Abnormality of gait 05/31/2013  . Dizziness and giddiness 05/31/2013    Orientation RESPIRATION BLADDER Height & Weight     Self  Normal Incontinent(catheter) Weight: 148 lb 9.4 oz (67.4 kg) Height:  6\' 2"  (188 cm)  BEHAVIORAL SYMPTOMS/MOOD NEUROLOGICAL BOWEL NUTRITION STATUS      Continent Diet(See DC summary)  AMBULATORY STATUS COMMUNICATION OF NEEDS Skin   Limited Assist Verbally Normal                       Personal Care Assistance Level of Assistance  Bathing, Feeding, Dressing Bathing Assistance: Maximum  assistance Feeding assistance: Limited assistance Dressing Assistance: Limited assistance     Functional Limitations Info  Sight, Hearing, Speech Sight Info: Adequate Hearing Info: Impaired Speech Info: Adequate    SPECIAL CARE FACTORS FREQUENCY  PT (By licensed PT), OT (By licensed OT)     PT Frequency: 5x/week OT Frequency: 5x/week            Contractures Contractures Info: Not present    Additional Factors Info  Code Status, Allergies Code Status Info: Full Allergies Info: Benadryl Diphenhydramine Hcl, Hydrocodone           Current Medications (09/02/2017):  This is the current hospital active medication list Current Facility-Administered Medications  Medication Dose Route Frequency Provider Last Rate Last Dose  . 0.9 %  sodium chloride infusion   Intravenous Once Hosie Poisson, MD      . acetaminophen (TYLENOL) tablet 650 mg  650 mg Oral Q6H PRN Opyd, Ilene Qua, MD   650 mg at 09/02/17 0224   Or  . acetaminophen (TYLENOL) suppository 650 mg  650 mg Rectal Q6H PRN Opyd, Ilene Qua, MD      . acyclovir (ZOVIRAX) tablet 400 mg  400 mg Oral Daily Opyd, Ilene Qua, MD   400 mg at 09/02/17 0946  . fluconazole (DIFLUCAN) tablet 200 mg  200 mg Oral Daily Opyd, Ilene Qua, MD   200 mg at 09/02/17 0947  . gabapentin (NEURONTIN) capsule 200 mg  200 mg Oral TID  Vianne Bulls, MD   200 mg at 09/02/17 0947  . LORazepam (ATIVAN) tablet 1 mg  1 mg Oral QHS Opyd, Ilene Qua, MD   1 mg at 09/01/17 2019  . nortriptyline (PAMELOR) capsule 25 mg  25 mg Oral QHS Opyd, Ilene Qua, MD   25 mg at 09/01/17 2019  . ondansetron (ZOFRAN) tablet 4 mg  4 mg Oral Q6H PRN Opyd, Ilene Qua, MD       Or  . ondansetron (ZOFRAN) injection 4 mg  4 mg Intravenous Q6H PRN Opyd, Ilene Qua, MD      . oxybutynin (DITROPAN) tablet 5 mg  5 mg Oral TID Vianne Bulls, MD   5 mg at 09/02/17 0946  . oxyCODONE-acetaminophen (PERCOCET) 7.5-325 MG per tablet 0.5-1 tablet  0.5-1 tablet Oral Q8H PRN Opyd, Ilene Qua, MD    1 tablet at 09/02/17 0947  . polyethylene glycol (MIRALAX / GLYCOLAX) packet 17 g  17 g Oral BID Opyd, Ilene Qua, MD   17 g at 09/02/17 0946  . QUEtiapine (SEROQUEL) tablet 25 mg  25 mg Oral QHS Opyd, Ilene Qua, MD   25 mg at 09/01/17 2020  . senna-docusate (Senokot-S) tablet 2 tablet  2 tablet Oral BID Vianne Bulls, MD   2 tablet at 09/02/17 0947  . triamcinolone cream (KENALOG) 0.1 % 1 application  1 application Topical q morning - 10a Opyd, Ilene Qua, MD   1 application at 76/22/63 3354     Discharge Medications: Please see discharge summary for a list of discharge medications.  Relevant Imaging Results:  Relevant Lab Results:   Additional Information SSN:  [id number]     Servando Snare, LCSW

## 2017-09-02 NOTE — Progress Notes (Signed)
Patient ID: Jordan Terry, male   DOB: [birth year], 83 y.o.   MRN: [id number]    Subjective: No new complaints.  Pt confused overnight.  Objective: Vital signs in last 24 hours: Temp:  [97.7 F (36.5 C)-98.6 F (37 C)] 97.7 F (36.5 C) (11/08 0540) Pulse Rate:  [77-90] 80 (11/08 0540) Resp:  [16-20] 18 (11/08 0540) BP: (106-127)/(53-97) 108/56 (11/08 0540) SpO2:  [95 %-100 %] 100 % (11/08 0540) Weight:  [67.4 kg (148 lb 9.4 oz)] 67.4 kg (148 lb 9.4 oz) (11/08 0540)  Intake/Output from previous day: 11/07 0701 - 11/08 0700 In: 90240 [P.O.:480; Blood:368] Out: 13600 [Urine:13600] Intake/Output this shift: No intake/output data recorded.  Physical Exam:  GU: Urine mostly clear on minimal CBI this morning.  Lab Results: Recent Labs    08/31/17 1107 09/01/17 0512 09/02/17 0520  HGB 7.5* 7.5* 8.1*  HCT 22.5* 22.8* 24.7*   CBC Latest Ref Rng & Units 09/02/2017 09/01/2017 08/31/2017  WBC 4.0 - 10.5 K/uL 2.1(L) 1.9(L) 1.8(L)  Hemoglobin 13.0 - 17.0 g/dL 8.1(L) 7.5(L) 7.5(L)  Hematocrit 39.0 - 52.0 % 24.7(L) 22.8(L) 22.5(L)  Platelets 150 - 400 K/uL 33(L) 37(L) 40(L)     BMET Recent Labs    08/31/17 0514 09/01/17 0512  NA 140 140  K 3.7 4.0  CL 104 106  CO2 26 25  GLUCOSE 119* 117*  BUN 21* 22*  CREATININE 1.02 1.03  CALCIUM 8.7* 8.5*     Studies/Results: No results found.  Assessment/Plan: Hematuria: Continue to wean CBI.   LOS: 2 days   Carigan Lister,LES 09/02/2017, 7:31 AM

## 2017-09-03 LAB — CBC WITH DIFFERENTIAL/PLATELET
Basophils Absolute: 0 10*3/uL (ref 0.0–0.1)
Basophils Relative: 1 %
EOS ABS: 0 10*3/uL (ref 0.0–0.7)
Eosinophils Relative: 1 %
HCT: 25.2 % — ABNORMAL LOW (ref 39.0–52.0)
Hemoglobin: 8.4 g/dL — ABNORMAL LOW (ref 13.0–17.0)
Lymphocytes Relative: 43 %
Lymphs Abs: 0.9 10*3/uL (ref 0.7–4.0)
MCH: 29.6 pg (ref 26.0–34.0)
MCHC: 33.3 g/dL (ref 30.0–36.0)
MCV: 88.7 fL (ref 78.0–100.0)
MONO ABS: 0.2 10*3/uL (ref 0.1–1.0)
Monocytes Relative: 8 %
NEUTROS ABS: 1 10*3/uL — AB (ref 1.7–7.7)
NRBC: 1 /100{WBCs} — AB
Neutrophils Relative %: 47 %
PLATELETS: 26 10*3/uL — AB (ref 150–400)
RBC: 2.84 MIL/uL — AB (ref 4.22–5.81)
RDW: 16.6 % — AB (ref 11.5–15.5)
WBC: 2.1 10*3/uL — AB (ref 4.0–10.5)

## 2017-09-03 MED ORDER — SODIUM CHLORIDE 0.9 % IV SOLN
Freq: Once | INTRAVENOUS | Status: AC
  Administered 2017-09-03: 16:00:00 via INTRAVENOUS

## 2017-09-03 NOTE — Progress Notes (Signed)
CRITICAL VALUE ALERT  Critical Value:  Platelets 26  Date & Time Notied:  09/03/2017 1142  Provider Notified: Dr. Karleen [place]  Orders Received/Actions taken: MD aware

## 2017-09-03 NOTE — Progress Notes (Signed)
CSW spoke with patient's attending MD who reported that patient may be ready for dc in the next 48 hours.  CSW started patient's insurance authorization with [employer].   CSW contacted by [facility] staff member April and informed that patient's insurance authorization request could not be processed for future date of 09/05/2017. Staff reported that CSW would need to resubmit insurance authorization on Monday 09/06/2017 when patient is medically stable.   CSW provided update to patient's attending MD and SNF.  CSW will resubmit patient's insurance authorization request on Monday and continue to follow and assist with discharge planning.  Abundio Miu, [place] Social Worker [facility] Cell#: [phone]

## 2017-09-03 NOTE — Plan of Care (Signed)
Patient consistently messing with foley catheter and getting out of bed without assistance, worried about having to "pee." I explained that he has a catheter and that it will empty his bladder for him.

## 2017-09-03 NOTE — Progress Notes (Signed)
Patient ID: Jordan Terry, male   DOB: 01-29-1932, 83 y.o.   MRN: [id number]    Subjective: Pt no longer requiring CBI.  Objective: Vital signs in last 24 hours: Temp:  [98.5 F (36.9 C)-99 F (37.2 C)] 98.7 F (37.1 C) (11/09 0411) Pulse Rate:  [41-76] 76 (11/09 0411) Resp:  [18] 18 (11/09 0411) BP: (111-132)/(55-64) 132/60 (11/09 0411) SpO2:  [97 %-99 %] 97 % (11/09 0411) Weight:  [67 kg (147 lb 11.2 oz)] 67 kg (147 lb 11.2 oz) (11/09 0411)  Intake/Output from previous day: 11/08 0701 - 11/09 0700 In: 1680 [P.O.:480] Out: 6550 [Urine:6550] Intake/Output this shift: No intake/output data recorded.  Physical Exam:  General: Alert and oriented GU: Urine clear in catheter  Lab Results: Recent Labs    09/01/17 0512 09/02/17 0520 09/03/17 1111  HGB 7.5* 8.1* 8.4*  HCT 22.8* 24.7* 25.2*   CBC Latest Ref Rng & Units 09/03/2017 09/02/2017 09/01/2017  WBC 4.0 - 10.5 K/uL 2.1(L) 2.1(L) 1.9(L)  Hemoglobin 13.0 - 17.0 g/dL 8.4(L) 8.1(L) 7.5(L)  Hematocrit 39.0 - 52.0 % 25.2(L) 24.7(L) 22.8(L)  Platelets 150 - 400 K/uL 26(LL) 33(L) 37(L)     BMET Recent Labs    09/01/17 0512  NA 140  K 4.0  CL 106  CO2 25  GLUCOSE 117*  BUN 22*  CREATININE 1.03  CALCIUM 8.5*     Studies/Results: No results found.  Assessment/Plan: 1) Hematuria: CBI now off.  Will plug 3rd port.  Patient can be discharged WITH his catheter.  He has outpatient follow up already scheduled with me for a voiding trial.  Would not d/c catheter yet considering he will need to restart intermittent catheterization once indwelling catheter removed due to urethral stricture disease.   LOS: 3 days   Kaeley Vinje,LES 09/03/2017, 12:01 PM

## 2017-09-03 NOTE — Progress Notes (Signed)
PROGRESS NOTE    Jordan Terry  GYF:[id number] DOB: December 05, 1931 DOA: 08/30/2017 PCP: [name], MD    Brief Narrative:  Jordan Terry is a 83 y.o. male with medical history significant for prostate cancer, depression with anxiety, chronic pain, and myelodysplastic syndrome followed by oncology and under treatment with chemotherapy, now presenting to the emergency department from his SNF for evaluation of gross hematuria.      Assessment & Plan:   Principal Problem:   Gross hematuria Active Problems:   Prostate cancer ([place])   Hematuria   Pancytopenia ([place])   Myelodysplastic syndrome ([place])   Palliative care by specialist   Gross hematuria:  Unclear etiology, no source of infection found,  Possibly from thrombocytopenia.   Urology consulted for recommendations, to continue with CBI,  Dr Grier Mitts recommendations to transfuse if platelets less than 20,000 and if hemoglobin less than 8.  So far he has received 1 unit of platelets and 1 unit of prbc transfusion.  Hemoglobin stable around 8.  Discussed with Dr Irene Limbo regarding the patient's admission.     MDS With pancytopenia:  S/p I unit of platelets. And one 1 unit of prbc transfusion.  Hemoglobin around 8 and platelets have dropped to less than 30,000. So 1 more unit of platelets ordered.      Chronic pain syndrome:  Resume home pain meds.     Abnormal UA.  CULTURES show insignificant growth.   In view of his repeated admissions with confusion, requested palliative care for goals of care.  Plan to continue with aggressive care at this time.  DVT prophylaxis: scd,s Code Status: full code.  Family Communication: none at bedside.  Disposition Plan: pending further eval.   Consultants:  Urology.  Palliative care.    Procedures: has CBI running.   Antimicrobials:none.   Subjective: No new complaints. Wants to be discharged.   Objective: Vitals:   09/03/17 0411 09/03/17 1341 09/03/17 1628 09/03/17  1703  BP: 132/60 (!) 121/57 (!) 145/94 112/63  Pulse: 76 67 100 81  Resp: 18  18 16   Temp: 98.7 F (37.1 C) 98.9 F (37.2 C) 98 F (36.7 C) 98.6 F (37 C)  TempSrc: Oral Oral Oral Oral  SpO2: 97% 90% 100% 100%  Weight: 67 kg (147 lb 11.2 oz)     Height:        Intake/Output Summary (Last 24 hours) at 09/03/2017 1738 Last data filed at 09/03/2017 1648 Gross per 24 hour  Intake 1683.33 ml  Output 2750 ml  Net -1066.67 ml   Filed Weights   09/01/17 0500 09/02/17 0540 09/03/17 0411  Weight: 66.3 kg (146 lb 2.6 oz) 67.4 kg (148 lb 9.4 oz) 67 kg (147 lb 11.2 oz)    Examination:  General exam: Appears calm and comfortable  Respiratory system: good air entry, no wheezing or rhonchi.   Cardiovascular system: S1 & S2 heard, RRR. No JVD, Gastrointestinal system: Abdomen is soft NT ND BS+ Central nervous system: Alert and oriented. Non focal.  Extremities: trace pedal edema.  Skin: No rashes, lesions or ulcers Psychiatry:  Mood & affect appropriate.     Data Reviewed: I have personally reviewed following labs and imaging studies  CBC: Recent Labs  Lab 08/31/17 0038 08/31/17 1107 09/01/17 0512 09/02/17 0520 09/03/17 1111  WBC 2.3* 1.8* 1.9* 2.1* 2.1*  NEUTROABS 1.1*  --   --  0.9* 1.0*  HGB 8.0* 7.5* 7.5* 8.1* 8.4*  HCT 23.9* 22.5* 22.8* 24.7* 25.2*  MCV 88.5  89.6 89.8 89.2 88.7  PLT 15* 40* 37* 33* 26*   Basic Metabolic Panel: Recent Labs  Lab 08/31/17 0038 08/31/17 0514 09/01/17 0512  NA 138 140 140  K 4.0 3.7 4.0  CL 103 104 106  CO2 26 26 25   GLUCOSE 122* 119* 117*  BUN 22* 21* 22*  CREATININE 1.07 1.02 1.03  CALCIUM 8.7* 8.7* 8.5*   GFR: Estimated Creatinine Clearance: 52.4 mL/min (by C-G formula based on SCr of 1.03 mg/dL). Liver Function Tests: Recent Labs  Lab 09/01/17 0512  AST 16  ALT 20  ALKPHOS 47  BILITOT 0.6  PROT 6.1*  ALBUMIN 3.0*   No results for input(s): LIPASE, AMYLASE in the last 168 hours. No results for input(s): AMMONIA in  the last 168 hours. Coagulation Profile: No results for input(s): INR, PROTIME in the last 168 hours. Cardiac Enzymes: No results for input(s): CKTOTAL, CKMB, CKMBINDEX, TROPONINI in the last 168 hours. BNP (last 3 results) No results for input(s): PROBNP in the last 8760 hours. HbA1C: No results for input(s): HGBA1C in the last 72 hours. CBG: No results for input(s): GLUCAP in the last 168 hours. Lipid Profile: No results for input(s): CHOL, HDL, LDLCALC, TRIG, CHOLHDL, LDLDIRECT in the last 72 hours. Thyroid Function Tests: No results for input(s): TSH, T4TOTAL, FREET4, T3FREE, THYROIDAB in the last 72 hours. Anemia Panel: No results for input(s): VITAMINB12, FOLATE, FERRITIN, TIBC, IRON, RETICCTPCT in the last 72 hours. Sepsis Labs: No results for input(s): PROCALCITON, LATICACIDVEN in the last 168 hours.  Recent Results (from the past 240 hour(s))  Urine culture     Status: Abnormal   Collection Time: 08/31/17  1:27 AM  Result Value Ref Range Status   Specimen Description URINE, CATHETERIZED  Final   Special Requests NONE  Final   Culture (A)  Final    <10,000 COLONIES/mL INSIGNIFICANT GROWTH Performed at [facility], [address], [place], [place] [address]    Report Status 09/01/2017 FINAL  Final         Radiology Studies: No results found.      Scheduled Meds: . acyclovir  400 mg Oral Daily  . fluconazole  200 mg Oral Daily  . gabapentin  200 mg Oral TID  . LORazepam  1 mg Oral QHS  . nortriptyline  25 mg Oral QHS  . oxybutynin  5 mg Oral TID  . polyethylene glycol  17 g Oral BID  . QUEtiapine  25 mg Oral QHS  . senna-docusate  2 tablet Oral BID  . triamcinolone cream  1 application Topical q morning - 10a   Continuous Infusions: . sodium chloride       LOS: 3 days    Time spent: 35 minutes.     Hosie Poisson, MD [facility] Pager [phone]   If 7PM-7AM, please contact night-coverage [email or web address] Password  [facility] 09/03/2017, 5:38 PM

## 2017-09-04 LAB — CBC WITH DIFFERENTIAL/PLATELET
BASOS ABS: 0 10*3/uL (ref 0.0–0.1)
BASOS PCT: 0 %
EOS PCT: 2 %
Eosinophils Absolute: 0 10*3/uL (ref 0.0–0.7)
HEMATOCRIT: 26.7 % — AB (ref 39.0–52.0)
Hemoglobin: 8.9 g/dL — ABNORMAL LOW (ref 13.0–17.0)
LYMPHS ABS: 0.7 10*3/uL (ref 0.7–4.0)
Lymphocytes Relative: 37 %
MCH: 29.9 pg (ref 26.0–34.0)
MCHC: 33.3 g/dL (ref 30.0–36.0)
MCV: 89.6 fL (ref 78.0–100.0)
Monocytes Absolute: 0.2 10*3/uL (ref 0.1–1.0)
Monocytes Relative: 9 %
NEUTROS PCT: 52 %
NRBC: 1 /100{WBCs} — AB
Neutro Abs: 1.1 10*3/uL — ABNORMAL LOW (ref 1.7–7.7)
Platelets: 44 10*3/uL — ABNORMAL LOW (ref 150–400)
RBC: 2.98 MIL/uL — ABNORMAL LOW (ref 4.22–5.81)
RDW: 16.7 % — AB (ref 11.5–15.5)
WBC: 2 10*3/uL — ABNORMAL LOW (ref 4.0–10.5)

## 2017-09-04 LAB — PREPARE PLATELET PHERESIS: Unit division: 0

## 2017-09-04 LAB — BPAM PLATELET PHERESIS
BLOOD PRODUCT EXPIRATION DATE: 201811122359
ISSUE DATE / TIME: 201811091635
UNIT TYPE AND RH: 5100

## 2017-09-04 NOTE — Progress Notes (Signed)
PROGRESS NOTE    Jordan Terry  VZD:[id number] DOB: 1931/12/27 DOA: 08/30/2017 PCP: [name], MD    Brief Narrative:  Jordan Terry is a 83 y.o. male with medical history significant for prostate cancer, depression with anxiety, chronic pain, and myelodysplastic syndrome followed by oncology and under treatment with chemotherapy, now presenting to the emergency department from his SNF for evaluation of gross hematuria.      Assessment & Plan:   Principal Problem:   Gross hematuria Active Problems:   Prostate cancer ([place])   Hematuria   Pancytopenia ([place])   Myelodysplastic syndrome ([place])   Palliative care by specialist   Gross hematuria:  Unclear etiology, no source of infection found,  Possibly from thrombocytopenia.   Urology consulted for recommendations, to continue with CBI,  Dr Grier Mitts recommendations to transfuse if platelets less than 20,000 and if hemoglobin less than 8.  So far he has received 2 units of platelets and 1 unit of prbc transfusion.  Hemoglobin stable around 8.  Discussed with Dr Irene Limbo regarding the patient's admission. No further new recommendations.     MDS With pancytopenia:  S/p 2 units of platelets. And one 1 unit of prbc transfusion.  Hemoglobin around 8 and platelets have improved to around 40,000.      Chronic pain syndrome:  Resume home pain meds.     Abnormal UA.  CULTURES show insignificant growth.   In view of his repeated admissions with confusion, requested palliative care for goals of care.  Plan to continue with aggressive care at this time.  DVT prophylaxis: scd,s Code Status: full code.  Family Communication: none at bedside.  Disposition Plan: SNF on Monday as we couldn't get insurance authorization.   Consultants:  Urology.  Palliative care.    Procedures:  CBI off.   Antimicrobials:none.   Subjective: No new complaints, didn't get enough sleep.   Objective: Vitals:   09/03/17 1703 09/03/17  1905 09/03/17 2027 09/04/17 0424  BP: 112/63 (!) 141/73 (!) 143/65 132/76  Pulse: 81 98 87 86  Resp: 16 18 18 18   Temp: 98.6 F (37 C) 98.1 F (36.7 C) 98.2 F (36.8 C) 98.6 F (37 C)  TempSrc: Oral Oral Oral Oral  SpO2: 100% 100% 100% 100%  Weight:    67.4 kg (148 lb 9.4 oz)  Height:        Intake/Output Summary (Last 24 hours) at 09/04/2017 1403 Last data filed at 09/04/2017 1300 Gross per 24 hour  Intake 387.33 ml  Output -  Net 387.33 ml   Filed Weights   09/02/17 0540 09/03/17 0411 09/04/17 0424  Weight: 67.4 kg (148 lb 9.4 oz) 67 kg (147 lb 11.2 oz) 67.4 kg (148 lb 9.4 oz)    Examination: no change in exam.   General exam: Appears calm and comfortable  Respiratory system: good air entry, no wheezing or rhonchi.   Cardiovascular system: S1 & S2 heard, RRR. No JVD, Gastrointestinal system: Abdomen is soft NT ND BS+ Central nervous system: Alert and oriented. Non focal.  Extremities: trace pedal edema.  Skin: No rashes, lesions or ulcers Psychiatry:  Mood & affect appropriate.     Data Reviewed: I have personally reviewed following labs and imaging studies  CBC: Recent Labs  Lab 08/31/17 0038 08/31/17 1107 09/01/17 0512 09/02/17 0520 09/03/17 1111 09/04/17 0933  WBC 2.3* 1.8* 1.9* 2.1* 2.1* 2.0*  NEUTROABS 1.1*  --   --  0.9* 1.0* 1.1*  HGB 8.0* 7.5* 7.5* 8.1* 8.4*  8.9*  HCT 23.9* 22.5* 22.8* 24.7* 25.2* 26.7*  MCV 88.5 89.6 89.8 89.2 88.7 89.6  PLT 15* 40* 37* 33* 26* 44*   Basic Metabolic Panel: Recent Labs  Lab 08/31/17 0038 08/31/17 0514 09/01/17 0512  NA 138 140 140  K 4.0 3.7 4.0  CL 103 104 106  CO2 26 26 25   GLUCOSE 122* 119* 117*  BUN 22* 21* 22*  CREATININE 1.07 1.02 1.03  CALCIUM 8.7* 8.7* 8.5*   GFR: Estimated Creatinine Clearance: 52.7 mL/min (by C-G formula based on SCr of 1.03 mg/dL). Liver Function Tests: Recent Labs  Lab 09/01/17 0512  AST 16  ALT 20  ALKPHOS 47  BILITOT 0.6  PROT 6.1*  ALBUMIN 3.0*   No results  for input(s): LIPASE, AMYLASE in the last 168 hours. No results for input(s): AMMONIA in the last 168 hours. Coagulation Profile: No results for input(s): INR, PROTIME in the last 168 hours. Cardiac Enzymes: No results for input(s): CKTOTAL, CKMB, CKMBINDEX, TROPONINI in the last 168 hours. BNP (last 3 results) No results for input(s): PROBNP in the last 8760 hours. HbA1C: No results for input(s): HGBA1C in the last 72 hours. CBG: No results for input(s): GLUCAP in the last 168 hours. Lipid Profile: No results for input(s): CHOL, HDL, LDLCALC, TRIG, CHOLHDL, LDLDIRECT in the last 72 hours. Thyroid Function Tests: No results for input(s): TSH, T4TOTAL, FREET4, T3FREE, THYROIDAB in the last 72 hours. Anemia Panel: No results for input(s): VITAMINB12, FOLATE, FERRITIN, TIBC, IRON, RETICCTPCT in the last 72 hours. Sepsis Labs: No results for input(s): PROCALCITON, LATICACIDVEN in the last 168 hours.  Recent Results (from the past 240 hour(s))  Urine culture     Status: Abnormal   Collection Time: 08/31/17  1:27 AM  Result Value Ref Range Status   Specimen Description URINE, CATHETERIZED  Final   Special Requests NONE  Final   Culture (A)  Final    <10,000 COLONIES/mL INSIGNIFICANT GROWTH Performed at [facility], [address], [place], [place] [address]    Report Status 09/01/2017 FINAL  Final         Radiology Studies: No results found.      Scheduled Meds: . acyclovir  400 mg Oral Daily  . fluconazole  200 mg Oral Daily  . gabapentin  200 mg Oral TID  . LORazepam  1 mg Oral QHS  . nortriptyline  25 mg Oral QHS  . oxybutynin  5 mg Oral TID  . polyethylene glycol  17 g Oral BID  . QUEtiapine  25 mg Oral QHS  . senna-docusate  2 tablet Oral BID  . triamcinolone cream  1 application Topical q morning - 10a   Continuous Infusions: . sodium chloride       LOS: 4 days    Time spent: 35 minutes.     Hosie Poisson, MD [facility] Pager  [phone]   If 7PM-7AM, please contact night-coverage [email or web address] Password [email or web address] 09/04/2017, 2:03 PM

## 2017-09-05 NOTE — Plan of Care (Signed)
Family have been at the bedside visiting.

## 2017-09-05 NOTE — Progress Notes (Signed)
PROGRESS NOTE    Jordan Terry  GYI:[id number] DOB: 21-Oct-1932 DOA: 08/30/2017 PCP: [name], MD    Brief Narrative:  Jordan Terry is a 83 y.o. male with medical history significant for prostate cancer, depression with anxiety, chronic pain, and myelodysplastic syndrome followed by oncology and under treatment with chemotherapy, now presenting to the emergency department from his SNF for evaluation of gross hematuria.      Assessment & Plan:   Principal Problem:   Gross hematuria Active Problems:   Prostate cancer ([place])   Hematuria   Pancytopenia ([place])   Myelodysplastic syndrome ([place])   Palliative care by specialist   Gross hematuria:  Unclear etiology, no source of infection found,  Possibly from thrombocytopenia.   Urology consulted for recommendations, to continue with CBI,  Dr Grier Mitts recommendations to transfuse if platelets less than 20,000 and if hemoglobin less than 8.  So far he has received 2 units of platelets and 1 unit of prbc transfusion.  Hemoglobin stable around 8.  Discussed with Dr Irene Limbo regarding the patient's admission. No further new recommendations.  No more hematuria today.     MDS With pancytopenia:  S/p 2 units of platelets. And one 1 unit of prbc transfusion.  Hemoglobin around 8 and platelets have improved to around 44,000.  Did not check counts today.  No indication of checking blood counts. He is asymptomatic.     Chronic pain syndrome:  Resume home pain meds.     Abnormal UA.  CULTURES show insignificant growth.   In view of his repeated admissions with confusion, requested palliative care for goals of care.  Plan to continue with aggressive care at this time.    DVT prophylaxis: scd,s Code Status: full code.  Family Communication: none at bedside.  Disposition Plan: SNF on Monday as we couldn't get insurance authorization.   Consultants:  Urology.  Palliative care.    Procedures:  CBI off.    Antimicrobials:none.   Subjective: No new complaints, comfortable.   Objective: Vitals:   09/04/17 1300 09/04/17 2239 09/05/17 0508 09/05/17 0919  BP: 120/66 109/65 133/61   Pulse: 95 84 87   Resp: 18 20 18    Temp: 98.7 F (37.1 C) 97.9 F (36.6 C) 97.6 F (36.4 C)   TempSrc: Oral Oral Oral   SpO2: 97% 98% 98% 94%  Weight:   66.4 kg (146 lb 6.2 oz)   Height:        Intake/Output Summary (Last 24 hours) at 09/05/2017 1222 Last data filed at 09/05/2017 0500 Gross per 24 hour  Intake 240 ml  Output 1175 ml  Net -935 ml   Filed Weights   09/03/17 0411 09/04/17 0424 09/05/17 0508  Weight: 67 kg (147 lb 11.2 oz) 67.4 kg (148 lb 9.4 oz) 66.4 kg (146 lb 6.2 oz)    Examination: no change in exam from yesterday.   General exam: Appears calm and comfortable  Respiratory system: good air entry, no wheezing or rhonchi.   Cardiovascular system: S1 & S2 heard, RRR. No JVD, Gastrointestinal system: Abdomen is soft NT ND BS+ Central nervous system: Alert and oriented. Non focal.  Extremities: trace pedal edema.  Skin: No rashes, lesions or ulcers Psychiatry:  Mood & affect appropriate.     Data Reviewed: I have personally reviewed following labs and imaging studies  CBC: Recent Labs  Lab 08/31/17 0038 08/31/17 1107 09/01/17 0512 09/02/17 0520 09/03/17 1111 09/04/17 0933  WBC 2.3* 1.8* 1.9* 2.1* 2.1* 2.0*  NEUTROABS  1.1*  --   --  0.9* 1.0* 1.1*  HGB 8.0* 7.5* 7.5* 8.1* 8.4* 8.9*  HCT 23.9* 22.5* 22.8* 24.7* 25.2* 26.7*  MCV 88.5 89.6 89.8 89.2 88.7 89.6  PLT 15* 40* 37* 33* 26* 44*   Basic Metabolic Panel: Recent Labs  Lab 08/31/17 0038 08/31/17 0514 09/01/17 0512  NA 138 140 140  K 4.0 3.7 4.0  CL 103 104 106  CO2 26 26 25   GLUCOSE 122* 119* 117*  BUN 22* 21* 22*  CREATININE 1.07 1.02 1.03  CALCIUM 8.7* 8.7* 8.5*   GFR: Estimated Creatinine Clearance: 51.9 mL/min (by C-G formula based on SCr of 1.03 mg/dL). Liver Function Tests: Recent Labs  Lab  09/01/17 0512  AST 16  ALT 20  ALKPHOS 47  BILITOT 0.6  PROT 6.1*  ALBUMIN 3.0*   No results for input(s): LIPASE, AMYLASE in the last 168 hours. No results for input(s): AMMONIA in the last 168 hours. Coagulation Profile: No results for input(s): INR, PROTIME in the last 168 hours. Cardiac Enzymes: No results for input(s): CKTOTAL, CKMB, CKMBINDEX, TROPONINI in the last 168 hours. BNP (last 3 results) No results for input(s): PROBNP in the last 8760 hours. HbA1C: No results for input(s): HGBA1C in the last 72 hours. CBG: No results for input(s): GLUCAP in the last 168 hours. Lipid Profile: No results for input(s): CHOL, HDL, LDLCALC, TRIG, CHOLHDL, LDLDIRECT in the last 72 hours. Thyroid Function Tests: No results for input(s): TSH, T4TOTAL, FREET4, T3FREE, THYROIDAB in the last 72 hours. Anemia Panel: No results for input(s): VITAMINB12, FOLATE, FERRITIN, TIBC, IRON, RETICCTPCT in the last 72 hours. Sepsis Labs: No results for input(s): PROCALCITON, LATICACIDVEN in the last 168 hours.  Recent Results (from the past 240 hour(s))  Urine culture     Status: Abnormal   Collection Time: 08/31/17  1:27 AM  Result Value Ref Range Status   Specimen Description URINE, CATHETERIZED  Final   Special Requests NONE  Final   Culture (A)  Final    <10,000 COLONIES/mL INSIGNIFICANT GROWTH Performed at [facility], [address], [place], [place] [address]    Report Status 09/01/2017 FINAL  Final         Radiology Studies: No results found.      Scheduled Meds: . acyclovir  400 mg Oral Daily  . fluconazole  200 mg Oral Daily  . gabapentin  200 mg Oral TID  . LORazepam  1 mg Oral QHS  . nortriptyline  25 mg Oral QHS  . oxybutynin  5 mg Oral TID  . polyethylene glycol  17 g Oral BID  . QUEtiapine  25 mg Oral QHS  . senna-docusate  2 tablet Oral BID  . triamcinolone cream  1 application Topical q morning - 10a   Continuous Infusions: . sodium chloride        LOS: 5 days    Time spent: 35 minutes.     Hosie Poisson, MD [facility] Pager [phone]   If 7PM-7AM, please contact night-coverage [email or web address] Password [email or web address] 09/05/2017, 12:22 PM

## 2017-09-06 ENCOUNTER — Ambulatory Visit: Payer: Medicare Other

## 2017-09-06 ENCOUNTER — Other Ambulatory Visit: Payer: Medicare Other

## 2017-09-06 MED ORDER — VITAMINS A & D EX OINT
TOPICAL_OINTMENT | CUTANEOUS | Status: AC
  Filled 2017-09-06: qty 5

## 2017-09-06 NOTE — Progress Notes (Signed)
Palliative care progress note  Reason for consult visit: Goals of care in light of MDS, thrombocytopenia, recurrent bleeding with transfusion and platelet dependence  I met with Mr. Kollmann, his wife, and his daughter, Butch Penny.  Butch Penny is his healthcare power of attorney, but [name] is making his own decisions in conjunction with support of his family.  Mr. Mullendore reports that the most important thing to him is his wife and his children.  His faith is also very important thing to him.  He reports that his doctors have been working to help explain things to him, however, he "wants to cut the chase."  We discussed his clinical course and how things have been changing in regard to recurrent admissions related to his thrombocytopenia and continued episodes of bleeding.  We discussed  option of continued aggressive medical intervention versus transitioning to comfort focused care path.    After discussing with his daughter and wife, he reports that he wants to proceed forward with a plan for comfort care and residential hospice placement.  He will be at high risk for symptom burden including uncontrolled bleeding.  I do think for this reason he would be best by served by residential hospice.  As he had been transfusion dependent, had been receiving platelets frequently, and is now planning to forego further transfusions and platelets, I do believe that his prognosis will be likely less than 2 weeks.  I discussed prognosis with Dr. Karleen Hampshire as well yesterday, and she agreed with this assessment.  I shared this information with him and his family today.    We also discussed heroic interventions at the end-of-life and he agrees this would not be in line with wishes for a natural death nor would it be likely to lead to him being well enough to have meaningful time with family. He and his family were in agreement with changing CODE STATUS to DO NOT RESUSCITATE.  - I placed a consult to social work to assist in  facilitating transition to residential hospice.  - Changed code status to DNR. - Completed durable DNR and placed on his chart.  Total time: 45 minutes Greater than 50%  of this time was spent counseling and coordinating care related to the above assessment and plan.  Micheline Rough, MD [place] Team [phone]

## 2017-09-06 NOTE — Progress Notes (Signed)
Physical Therapy Discharge Patient Details Name: AVANT PRINTY MRN: [id number] DOB: April 03, 1932 Today's Date: 09/06/2017 Time:  -     Patient discharged from PT services secondary to medical decline - will need to re-order PT to resume therapy services.  Please see latest therapy progress note for current level of functioning and progress toward goals.    GP     Marcelino Freestone PT [phone]  09/06/2017, 2:40 PM

## 2017-09-06 NOTE — Progress Notes (Signed)
CSW notified by Palliative MD that patient/patient's family have decided to pursue residential hospice.   CSW followed up with patient's daughter who confirmed plan for residential hospice. Patient's daughter reported that their first preference is [facility].  CSW contacted [employer] and made referral.  CSW updated patient's attending MD.  CSW will continue to follow and assist with discharge planning.  Abundio Miu, [place] Social Worker [facility] Cell#: [phone]

## 2017-09-06 NOTE — Progress Notes (Signed)
Family called.  They are able to meet at 1330 for a family meeting.  Micheline Rough, MD [place] Team [phone]

## 2017-09-06 NOTE — Plan of Care (Signed)
Stool softer given.

## 2017-09-06 NOTE — Progress Notes (Signed)
Palliative care progress note  Reason for consult visit: Goals of care in light of MDS, thrombocytopenia, recurrent bleeding with transfusion and platelet dependence  Received voicemail from patient's daughter, [name], stating that family has been discussing options moving forward for care for Mr. Scally in light of the fact that he has been having recurrent hospitalizations and poor quality of life secondary to MDS, thrombocytopenia, and transfusion dependence.  I met with Mr. Kosh, spoke with his son Linna Hoff in the hall, and also discussed with his daughter Butch Penny via phone.  Daughter is his healthcare power of attorney though Mr. Malkin is currently in a position where he is making decisions in conjunction with support of his family.  When I asked Mr. Norrington, he reports that family is "still trying to figure things out" regarding care plan moving forward.  He asked me to call and speak with his daughter, Butch Penny.  I called Butch Penny, and we discussed clinical course as well as wishes moving forward in regard to advanced directives.  Values and goals of care important to patient and family were attempted to be elicited.  We discussed difference between a aggressive medical intervention path and a palliative, comfort focused care path.    Concept of Hospice and Palliative Care were discussed  In the end, agreement was that he may be best served by focusing on comfort care and considering placement at residential hospice rather than continuing current cycle of recurrent hospitalizations as we are unable to fix the underlying problem.  He is now transfusion dependent and receiving platelets frequently.  If the goal were for strict comfort and he were to forego further transfusions and platelets, I do believe that his prognosis will be likely less than 2 weeks.  He will be at high risk for symptom burden including uncontrolled bleeding.  I do think for these reasons he would be best by served by residential  hospice if this is the decision the family makes.  Patient and his family would like to meet tomorrow in order to discuss options moving forward.  It appears that we are crossroads of deciding between continuing current care which would be plan for skilled facility with palliative following versus transitioning to full comfort care and pursuing placement residential hospice for end-of-life.  Total time: 45 minutes Greater than 50%  of this time was spent counseling and coordinating care related to the above assessment and plan.  Micheline Rough, MD [place] Team [phone]

## 2017-09-06 NOTE — Discharge Summary (Signed)
Physician Discharge Summary  Jordan Terry NTI:[id number] DOB: 1932-04-20 DOA: 08/30/2017  PCP: [name], MD  Admit date: 08/30/2017 Discharge date: 09/07/2017  Admitted From: SNF Disposition:  Residential hospice.   Recommendations for Outpatient Follow-up:   Please follow up with hospice MD AS NEEDED.   Discharge Condition:hospice CODE STATUS:dnr Diet recommendation: Heart Healthy   Brief/Interim Summary: Jordan Terry a 83 y.o.malewith medical history significant forprostate cancer, depression with anxiety, chronic pain, and myelodysplastic syndrome followed by oncology and under treatment with chemotherapy, now presenting to the emergency department from his SNF for evaluation of gross hematuria. He received 2 unit sof platelets and 1 unit of prbc transfusion this admission. In view of his repeated admissions and transfusion dependence, discussed with Dr Irene Limbo , who recommended palliative approach at this time . Palliative care consulted and discussed continuing aggressive medical intervention vs transitioning to comfort focused care, . At this time pt, wife and his daughter wanted to proceed with comfort care. A request for residential hospice placed.     Discharge Diagnoses:  Principal Problem:   Gross hematuria Active Problems:   Prostate cancer ([place])   Hematuria   Pancytopenia ([place])   Myelodysplastic syndrome ([place])   Palliative care by specialist  Gross hematuria:  Unclear etiology, no source of infection found,  Possibly from thrombocytopenia.   Urology consulted for recommendations, to continue with [facility],  Dr Grier Mitts recommendations to transfuse if platelets less than 20,000 and if hemoglobin less than 8.  So far he has received 2 units of platelets and 1 unit of prbc transfusion.  Hemoglobin stable around 8.      MDS With pancytopenia:  S/p 2 units of platelets. And one 1 unit of prbc transfusion.  Hemoglobin around 8 and platelets have  improved to around 44,000.  No more blood draws at this time.      Chronic pain syndrome:  Resume home pain meds    Discharge Instructions   Allergies as of 09/06/2017      Reactions   Benadryl [diphenhydramine Hcl] Anxiety   MAKES HIM GO CRAZY   Hydrocodone Itching   Small amounts are okay      Medication List    STOP taking these medications   cephALEXin 500 MG capsule Commonly known as:  KEFLEX     TAKE these medications   acyclovir 400 MG tablet Commonly known as:  ZOVIRAX Take 1 tablet (400 mg total) by mouth daily.   fluconazole 200 MG tablet Commonly known as:  DIFLUCAN Take 1 tablet (200 mg total) by mouth daily.   gabapentin 100 MG capsule Commonly known as:  NEURONTIN Take 200 mg by mouth 3 (three) times daily.   LORazepam 0.5 MG tablet Commonly known as:  ATIVAN Take 1 mg by mouth at bedtime.   nortriptyline 25 MG capsule Commonly known as:  PAMELOR Take 1 capsule (25 mg total) by mouth at bedtime.   ondansetron 8 MG tablet Commonly known as:  ZOFRAN Take 1 tablet (8 mg total) by mouth 2 (two) times daily as needed (Nausea or vomiting).   oxybutynin 5 MG tablet Commonly known as:  DITROPAN Take 5 mg by mouth 3 (three) times daily.   oxyCODONE-acetaminophen 7.5-325 MG tablet Commonly known as:  PERCOCET Take 0.5-1 tablets by mouth every 8 (eight) hours as needed (pain.). What changed:    how much to take  reasons to take this   polyethylene glycol packet Commonly known as:  MIRALAX / GLYCOLAX Take 17 g  by mouth 2 (two) times daily.   QUEtiapine 25 MG tablet Commonly known as:  SEROQUEL Take 1 tablet (25 mg total) by mouth at bedtime.   senna-docusate 8.6-50 MG tablet Commonly known as:  Senokot-S Take 2 tablets by mouth 2 (two) times daily.   triamcinolone cream 0.1 % Commonly known as:  KENALOG Apply 1 application topically every morning. APPLY TO CHEST AND BACK EVERY MORNING FOR GROVER DISEASE      Contact information  for after-discharge care    Destination    [facility] .   Service:  Skilled Nursing Contact information: [address] [place] [phone]             Allergies  Allergen Reactions  . Benadryl [Diphenhydramine Hcl] Anxiety    MAKES HIM GO CRAZY  . Hydrocodone Itching    Small amounts are okay    Consultations:  Palliative care  Oncology Dr Irene Limbo over the phone.    Procedures/Studies: Dg Abd Acute W/chest  Result Date: 08/21/2017 CLINICAL DATA:  Abdominal pain. EXAM: DG ABDOMEN ACUTE W/ 1V CHEST COMPARISON:  June 22, 2017. FINDINGS: There is no evidence of dilated bowel loops or free intraperitoneal air. No radiopaque calculi or other significant radiographic abnormality is seen. Unchanged prostatic seed implants. Degenerative changes of the lumbar spine. Heart size and mediastinal contours are within normal limits. Both lungs are clear. IMPRESSION: Negative abdominal radiographs.  No acute cardiopulmonary disease. Electronically Signed   By: Titus Dubin M.D.   On: 08/21/2017 18:03      Subjective: No new complaints.   Discharge Exam: Vitals:   09/05/17 2112 09/06/17 0400  BP: (!) 105/59 (!) 113/56  Pulse: 84 87  Resp: 20 20  Temp: 98.7 F (37.1 C) 98.3 F (36.8 C)  SpO2: 97% 99%   Vitals:   09/05/17 0919 09/05/17 1556 09/05/17 2112 09/06/17 0400  BP:  113/64 (!) 105/59 (!) 113/56  Pulse:  90 84 87  Resp:  18 20 20   Temp:  98.2 F (36.8 C) 98.7 F (37.1 C) 98.3 F (36.8 C)  TempSrc:  Oral Oral Oral  SpO2: 94% 99% 97% 99%  Weight:    65.9 kg (145 lb 4.5 oz)  Height:        General: Pt is alert, awake, not in acute distress Cardiovascular: RRR, S1/S2 +, no rubs, no gallops Respiratory: CTA bilaterally, no wheezing, no rhonchi Abdominal: Soft, NT, ND, bowel sounds + Extremities: no edema, no cyanosis    The results of significant diagnostics from this hospitalization (including imaging,  microbiology, ancillary and laboratory) are listed below for reference.     Microbiology: Recent Results (from the past 240 hour(s))  Urine culture     Status: Abnormal   Collection Time: 08/31/17  1:27 AM  Result Value Ref Range Status   Specimen Description URINE, CATHETERIZED  Final   Special Requests NONE  Final   Culture (A)  Final    <10,000 COLONIES/mL INSIGNIFICANT GROWTH Performed at [facility], [address] [place], [place] [address]    Report Status 09/01/2017 FINAL  Final     Labs: BNP (last 3 results) No results for input(s): BNP in the last 8760 hours. Basic Metabolic Panel: Recent Labs  Lab 08/31/17 0038 08/31/17 0514 09/01/17 0512  NA 138 140 140  K 4.0 3.7 4.0  CL 103 104 106  CO2 26 26 25   GLUCOSE 122* 119* 117*  BUN 22* 21* 22*  CREATININE 1.07 1.02 1.03  CALCIUM 8.7* 8.7* 8.5*   Liver Function Tests: Recent Labs  Lab 09/01/17 0512  AST 16  ALT 20  ALKPHOS 47  BILITOT 0.6  PROT 6.1*  ALBUMIN 3.0*   No results for input(s): LIPASE, AMYLASE in the last 168 hours. No results for input(s): AMMONIA in the last 168 hours. CBC: Recent Labs  Lab 08/31/17 0038 08/31/17 1107 09/01/17 0512 09/02/17 0520 09/03/17 1111 09/04/17 0933  WBC 2.3* 1.8* 1.9* 2.1* 2.1* 2.0*  NEUTROABS 1.1*  --   --  0.9* 1.0* 1.1*  HGB 8.0* 7.5* 7.5* 8.1* 8.4* 8.9*  HCT 23.9* 22.5* 22.8* 24.7* 25.2* 26.7*  MCV 88.5 89.6 89.8 89.2 88.7 89.6  PLT 15* 40* 37* 33* 26* 44*   Cardiac Enzymes: No results for input(s): CKTOTAL, CKMB, CKMBINDEX, TROPONINI in the last 168 hours. BNP: Invalid input(s): POCBNP CBG: No results for input(s): GLUCAP in the last 168 hours. D-Dimer No results for input(s): DDIMER in the last 72 hours. Hgb A1c No results for input(s): HGBA1C in the last 72 hours. Lipid Profile No results for input(s): CHOL, HDL, LDLCALC, TRIG, CHOLHDL, LDLDIRECT in the last 72 hours. Thyroid function studies No results for input(s): TSH, T4TOTAL,  T3FREE, THYROIDAB in the last 72 hours.  Invalid input(s): FREET3 Anemia work up No results for input(s): VITAMINB12, FOLATE, FERRITIN, TIBC, IRON, RETICCTPCT in the last 72 hours. Urinalysis    Component Value Date/Time   COLORURINE RED (A) 08/31/2017 0127   APPEARANCEUR TURBID (A) 08/31/2017 0127   LABSPEC  08/31/2017 0127    TEST NOT REPORTED DUE TO COLOR INTERFERENCE OF URINE PIGMENT   PHURINE  08/31/2017 0127    TEST NOT REPORTED DUE TO COLOR INTERFERENCE OF URINE PIGMENT   GLUCOSEU (A) 08/31/2017 0127    TEST NOT REPORTED DUE TO COLOR INTERFERENCE OF URINE PIGMENT   HGBUR (A) 08/31/2017 0127    TEST NOT REPORTED DUE TO COLOR INTERFERENCE OF URINE PIGMENT   BILIRUBINUR (A) 08/31/2017 0127    TEST NOT REPORTED DUE TO COLOR INTERFERENCE OF URINE PIGMENT   KETONESUR (A) 08/31/2017 0127    TEST NOT REPORTED DUE TO COLOR INTERFERENCE OF URINE PIGMENT   PROTEINUR (A) 08/31/2017 0127    TEST NOT REPORTED DUE TO COLOR INTERFERENCE OF URINE PIGMENT   UROBILINOGEN 0.2 03/29/2015 0611   NITRITE (A) 08/31/2017 0127    TEST NOT REPORTED DUE TO COLOR INTERFERENCE OF URINE PIGMENT   LEUKOCYTESUR (A) 08/31/2017 0127    TEST NOT REPORTED DUE TO COLOR INTERFERENCE OF URINE PIGMENT   Sepsis Labs Invalid input(s): PROCALCITONIN,  WBC,  LACTICIDVEN Microbiology Recent Results (from the past 240 hour(s))  Urine culture     Status: Abnormal   Collection Time: 08/31/17  1:27 AM  Result Value Ref Range Status   Specimen Description URINE, CATHETERIZED  Final   Special Requests NONE  Final   Culture (A)  Final    <10,000 COLONIES/mL INSIGNIFICANT GROWTH Performed at [facility], [address], [place], [place] [address]    Report Status 09/01/2017 FINAL  Final     Time coordinating discharge: Over 30 minutes  SIGNED:   Hosie Poisson, MD  [facility] 09/06/2017, 4:49 PM Pager   If 7PM-7AM, please contact night-coverage [email or web address] Password [email or web address]

## 2017-09-07 ENCOUNTER — Ambulatory Visit: Payer: Medicare Other

## 2017-09-07 ENCOUNTER — Ambulatory Visit: Payer: Medicare Other | Admitting: Hematology

## 2017-09-07 NOTE — Discharge Summary (Signed)
Physician Discharge Summary  Jordan Terry NOM:[id number] DOB: Jan 03, 1932 DOA: 08/30/2017  PCP: [name], MD  Admit date: 08/30/2017 Discharge date: 09/07/2017  Admitted From: SNF Disposition:  Residential hospice.   Recommendations for Outpatient Follow-up:   Please follow up with hospice MD AS NEEDED.   Discharge Condition:hospice CODE STATUS:dnr Diet recommendation: Heart Healthy   Brief/Interim Summary: Jordan Terry a 83 y.o.malewith medical history significant forprostate cancer, depression with anxiety, chronic pain, and myelodysplastic syndrome followed by oncology and under treatment with chemotherapy, now presenting to the emergency department from his SNF for evaluation of gross hematuria. He received 2 unit sof platelets and 1 unit of prbc transfusion this admission. In view of his repeated admissions and transfusion dependence, discussed with Dr Irene Limbo , who recommended palliative approach at this time . Palliative care consulted and discussed continuing aggressive medical intervention vs transitioning to comfort focused care, . At this time pt, wife and his daughter wanted to proceed with comfort care. A request for residential hospice placed.     Discharge Diagnoses:  Principal Problem:   Gross hematuria Active Problems:   Prostate cancer ([place])   Hematuria   Pancytopenia ([employer])   Myelodysplastic syndrome ([place])   Palliative care by specialist  Gross hematuria:  Unclear etiology, no source of infection found,  Possibly from thrombocytopenia.   Urology consulted for recommendations, to continue with [facility],  Dr Grier Mitts recommendations to transfuse if platelets less than 20,000 and if hemoglobin less than 8.  So far he has received 2 units of platelets and 1 unit of prbc transfusion.  Hemoglobin stable around 8.      MDS With pancytopenia:  S/p 2 units of platelets. And one 1 unit of prbc transfusion.  Hemoglobin around 8 and platelets have  improved to around 44,000.  No more blood draws at this time.      Chronic pain syndrome:  Resume home pain meds    Discharge Instructions  Discharge Instructions    Discharge instructions   Complete by:  As directed    Please follow up with hospice MD as needed.     Allergies as of 09/07/2017      Reactions   Benadryl [diphenhydramine Hcl] Anxiety   MAKES HIM GO CRAZY   Hydrocodone Itching   Small amounts are okay      Medication List    STOP taking these medications   cephALEXin 500 MG capsule Commonly known as:  KEFLEX     TAKE these medications   acyclovir 400 MG tablet Commonly known as:  ZOVIRAX Take 1 tablet (400 mg total) by mouth daily.   fluconazole 200 MG tablet Commonly known as:  DIFLUCAN Take 1 tablet (200 mg total) by mouth daily.   gabapentin 100 MG capsule Commonly known as:  NEURONTIN Take 200 mg by mouth 3 (three) times daily.   LORazepam 0.5 MG tablet Commonly known as:  ATIVAN Take 1 mg by mouth at bedtime.   nortriptyline 25 MG capsule Commonly known as:  PAMELOR Take 1 capsule (25 mg total) by mouth at bedtime.   ondansetron 8 MG tablet Commonly known as:  ZOFRAN Take 1 tablet (8 mg total) by mouth 2 (two) times daily as needed (Nausea or vomiting).   oxybutynin 5 MG tablet Commonly known as:  DITROPAN Take 5 mg by mouth 3 (three) times daily.   oxyCODONE-acetaminophen 7.5-325 MG tablet Commonly known as:  PERCOCET Take 0.5-1 tablets by mouth every 8 (eight) hours as needed (pain.). What  changed:    how much to take  reasons to take this   polyethylene glycol packet Commonly known as:  MIRALAX / GLYCOLAX Take 17 g by mouth 2 (two) times daily.   QUEtiapine 25 MG tablet Commonly known as:  SEROQUEL Take 1 tablet (25 mg total) by mouth at bedtime.   senna-docusate 8.6-50 MG tablet Commonly known as:  Senokot-S Take 2 tablets by mouth 2 (two) times daily.   triamcinolone cream 0.1 % Commonly known as:   KENALOG Apply 1 application topically every morning. APPLY TO CHEST AND BACK EVERY MORNING FOR GROVER DISEASE      Contact information for after-discharge care    Destination    [facility] .   Service:  Skilled Nursing Contact information: [address] [place] [phone]             Allergies  Allergen Reactions  . Benadryl [Diphenhydramine Hcl] Anxiety    MAKES HIM GO CRAZY  . Hydrocodone Itching    Small amounts are okay    Consultations:  Palliative care  Oncology Dr Irene Limbo over the phone.    Procedures/Studies: Dg Abd Acute W/chest  Result Date: 08/21/2017 CLINICAL DATA:  Abdominal pain. EXAM: DG ABDOMEN ACUTE W/ 1V CHEST COMPARISON:  June 22, 2017. FINDINGS: There is no evidence of dilated bowel loops or free intraperitoneal air. No radiopaque calculi or other significant radiographic abnormality is seen. Unchanged prostatic seed implants. Degenerative changes of the lumbar spine. Heart size and mediastinal contours are within normal limits. Both lungs are clear. IMPRESSION: Negative abdominal radiographs.  No acute cardiopulmonary disease. Electronically Signed   By: Titus Dubin M.D.   On: 08/21/2017 18:03      Subjective: No new complaints.   Discharge Exam: Vitals:   09/06/17 2130 09/07/17 0543  BP: 113/64 (!) 104/57  Pulse: 87 87  Resp: 20 20  Temp: 98.5 F (36.9 C) 98.3 F (36.8 C)  SpO2: 98% 100%   Vitals:   09/05/17 2112 09/06/17 0400 09/06/17 2130 09/07/17 0543  BP: (!) 105/59 (!) 113/56 113/64 (!) 104/57  Pulse: 84 87 87 87  Resp: 20 20 20 20   Temp: 98.7 F (37.1 C) 98.3 F (36.8 C) 98.5 F (36.9 C) 98.3 F (36.8 C)  TempSrc: Oral Oral Oral Oral  SpO2: 97% 99% 98% 100%  Weight:  65.9 kg (145 lb 4.5 oz)  66.3 kg (146 lb 2.6 oz)  Height:        General: Pt is alert, awake, not in acute distress Cardiovascular: RRR, S1/S2 +, no rubs, no gallops Respiratory: CTA bilaterally, no  wheezing, no rhonchi Abdominal: Soft, NT, ND, bowel sounds + Extremities: no edema, no cyanosis    The results of significant diagnostics from this hospitalization (including imaging, microbiology, ancillary and laboratory) are listed below for reference.     Microbiology: Recent Results (from the past 240 hour(s))  Urine culture     Status: Abnormal   Collection Time: 08/31/17  1:27 AM  Result Value Ref Range Status   Specimen Description URINE, CATHETERIZED  Final   Special Requests NONE  Final   Culture (A)  Final    <10,000 COLONIES/mL INSIGNIFICANT GROWTH Performed at [facility], [address] [place], [place] [address]    Report Status 09/01/2017 FINAL  Final     Labs: BNP (last 3 results) No results for input(s): BNP in the last 8760 hours. Basic Metabolic Panel: Recent Labs  Lab 09/01/17  0512  NA 140  K 4.0  CL 106  CO2 25  GLUCOSE 117*  BUN 22*  CREATININE 1.03  CALCIUM 8.5*   Liver Function Tests: Recent Labs  Lab 09/01/17 0512  AST 16  ALT 20  ALKPHOS 47  BILITOT 0.6  PROT 6.1*  ALBUMIN 3.0*   No results for input(s): LIPASE, AMYLASE in the last 168 hours. No results for input(s): AMMONIA in the last 168 hours. CBC: Recent Labs  Lab 08/31/17 1107 09/01/17 0512 09/02/17 0520 09/03/17 1111 09/04/17 0933  WBC 1.8* 1.9* 2.1* 2.1* 2.0*  NEUTROABS  --   --  0.9* 1.0* 1.1*  HGB 7.5* 7.5* 8.1* 8.4* 8.9*  HCT 22.5* 22.8* 24.7* 25.2* 26.7*  MCV 89.6 89.8 89.2 88.7 89.6  PLT 40* 37* 33* 26* 44*   Cardiac Enzymes: No results for input(s): CKTOTAL, CKMB, CKMBINDEX, TROPONINI in the last 168 hours. BNP: Invalid input(s): POCBNP CBG: No results for input(s): GLUCAP in the last 168 hours. D-Dimer No results for input(s): DDIMER in the last 72 hours. Hgb A1c No results for input(s): HGBA1C in the last 72 hours. Lipid Profile No results for input(s): CHOL, HDL, LDLCALC, TRIG, CHOLHDL, LDLDIRECT in the last 72 hours. Thyroid function  studies No results for input(s): TSH, T4TOTAL, T3FREE, THYROIDAB in the last 72 hours.  Invalid input(s): FREET3 Anemia work up No results for input(s): VITAMINB12, FOLATE, FERRITIN, TIBC, IRON, RETICCTPCT in the last 72 hours. Urinalysis    Component Value Date/Time   COLORURINE RED (A) 08/31/2017 0127   APPEARANCEUR TURBID (A) 08/31/2017 0127   LABSPEC  08/31/2017 0127    TEST NOT REPORTED DUE TO COLOR INTERFERENCE OF URINE PIGMENT   PHURINE  08/31/2017 0127    TEST NOT REPORTED DUE TO COLOR INTERFERENCE OF URINE PIGMENT   GLUCOSEU (A) 08/31/2017 0127    TEST NOT REPORTED DUE TO COLOR INTERFERENCE OF URINE PIGMENT   HGBUR (A) 08/31/2017 0127    TEST NOT REPORTED DUE TO COLOR INTERFERENCE OF URINE PIGMENT   BILIRUBINUR (A) 08/31/2017 0127    TEST NOT REPORTED DUE TO COLOR INTERFERENCE OF URINE PIGMENT   KETONESUR (A) 08/31/2017 0127    TEST NOT REPORTED DUE TO COLOR INTERFERENCE OF URINE PIGMENT   PROTEINUR (A) 08/31/2017 0127    TEST NOT REPORTED DUE TO COLOR INTERFERENCE OF URINE PIGMENT   UROBILINOGEN 0.2 03/29/2015 0611   NITRITE (A) 08/31/2017 0127    TEST NOT REPORTED DUE TO COLOR INTERFERENCE OF URINE PIGMENT   LEUKOCYTESUR (A) 08/31/2017 0127    TEST NOT REPORTED DUE TO COLOR INTERFERENCE OF URINE PIGMENT   Sepsis Labs Invalid input(s): PROCALCITONIN,  WBC,  LACTICIDVEN Microbiology Recent Results (from the past 240 hour(s))  Urine culture     Status: Abnormal   Collection Time: 08/31/17  1:27 AM  Result Value Ref Range Status   Specimen Description URINE, CATHETERIZED  Final   Special Requests NONE  Final   Culture (A)  Final    <10,000 COLONIES/mL INSIGNIFICANT GROWTH Performed at [facility], [address] [place], [place] [address]    Report Status 09/01/2017 FINAL  Final     Time coordinating discharge: Over 30 minutes  SIGNED:   Hosie Poisson, MD  [facility] 09/07/2017, 10:43 AM Pager   If 7PM-7AM, please contact  night-coverage [email or web address] Password [email or web address]

## 2017-09-07 NOTE — Consult Note (Signed)
[facility] room available for [name] today. Paper work complete for transfer. Discharge summary has been sent.   RN please call report to [phone].  Thank you,  Jordan Conte, LCSW [phone]

## 2017-09-07 NOTE — Progress Notes (Signed)
Patient discharging to [facility] residential hospice, all paperwork complete.  CSW contacted [facility] and arranged transportation for patient, patient's family notified. Patient's RN called report and packet complete. CSW signing off, no other needs identified at this time.  Abundio Miu, [place] Social Worker [facility] Cell#: [phone]

## 2017-09-07 NOTE — Progress Notes (Signed)
Patient ID: COLBIN JOVEL, male   DOB: [birth year], 83 y.o.   MRN: [id number]  Note made of patient's decision to proceed with palliative care at this point.  Will cancel follow up in our office.  Would recommend he continue with indwelling Foley catheter to avoid urinary retention and needing to catheterize himself.  Please call if he requires further urologic assistance or input.  Thanks.

## 2017-09-07 NOTE — Care Management Note (Signed)
Case Management Note  Patient Details  Name: Jordan Terry MRN: [id number] Date of Birth: Dec 01, 1931  Subjective/Objective: d/c today residential hospice-[facility]-CSW managing.                   Action/Plan:d/c residential hospice.   Expected Discharge Date:  09/07/17               Expected Discharge Plan:  [place]  In-House Referral:  Clinical Social Work  Discharge planning Services  CM Consult  Post Acute Care Choice:    Choice offered to:     DME Arranged:    DME Agency:     HH Arranged:    [place] Agency:     Status of Service:  Completed, signed off  If discussed at [employer], dates discussed:    Additional Comments:  Dessa Phi, RN 09/07/2017, 11:03 AM

## 2017-09-07 NOTE — Progress Notes (Signed)
Patient was alert and oriented x4 before taking his night medications. About two hours after taking night medications, he became very confused. He started trying to get out of bed very often.  He said that he needed to have a BM.  We got him up to the bedside commode several times throughout the night, but he did not have a BM.  He also got out of bed several times unassisted, setting off bed alarm, multiple times during the night.

## 2017-09-07 NOTE — Plan of Care (Signed)
  Progressing Education: Knowledge of General Education information will improve 09/07/2017 0859 - Progressing by Carmela Hurt, RN Health Behavior/Discharge Planning: Ability to manage health-related needs will improve 09/07/2017 0859 - Progressing by Carmela Hurt, RN Clinical Measurements: Ability to maintain clinical measurements within normal limits will improve 09/07/2017 0859 - Progressing by Carmela Hurt, RN Will remain free from infection 09/07/2017 0859 - Progressing by Carmela Hurt, RN Diagnostic test results will improve 09/07/2017 0859 - Progressing by Carmela Hurt, RN Respiratory complications will improve 09/07/2017 0859 - Progressing by Carmela Hurt, RN Cardiovascular complication will be avoided 09/07/2017 0859 - Progressing by Carmela Hurt, RN Activity: Risk for activity intolerance will decrease 09/07/2017 0859 - Progressing by Carmela Hurt, RN Nutrition: Adequate nutrition will be maintained 09/07/2017 0859 - Progressing by Carmela Hurt, RN Coping: Level of anxiety will decrease 09/07/2017 0859 - Progressing by Carmela Hurt, RN Elimination: Will not experience complications related to bowel motility 09/07/2017 0859 - Progressing by Carmela Hurt, RN Will not experience complications related to urinary retention 09/07/2017 0859 - Progressing by Carmela Hurt, RN Pain Managment: General experience of comfort will improve 09/07/2017 0859 - Progressing by Carmela Hurt, RN Skin Integrity: Risk for impaired skin integrity will decrease 09/07/2017 0859 - Progressing by Carmela Hurt, RN

## 2017-09-07 NOTE — Progress Notes (Signed)
   09/07/17 1034  Clinical Encounter Type  Visited With Patient  Visit Type Initial  Spiritual Encounters  Spiritual Needs Prayer   Visiting patients on the Palliative list.  He was alert, seemed a little confused.  He shared some of his story and seems to have a good outlook.  We prayed together.  Will follow as needed. Chaplain Katherene Ponto

## 2017-09-08 ENCOUNTER — Ambulatory Visit: Payer: Medicare Other

## 2017-09-09 ENCOUNTER — Ambulatory Visit: Payer: Medicare Other

## 2017-09-10 ENCOUNTER — Ambulatory Visit: Payer: Medicare Other

## 2017-09-25 DEATH — deceased

## 2017-12-28 ENCOUNTER — Other Ambulatory Visit: Payer: Self-pay | Admitting: Medical

## 2018-03-01 IMAGING — CR DG ABDOMEN ACUTE W/ 1V CHEST
3 series · 3 of 3 positions shown · non-contrast
Comparison: 03/08/2016

CLINICAL DATA: Lower abdominal pain for 1 week.

EXAM:
DG ABDOMEN ACUTE W/ 1V CHEST

[w chest pa]
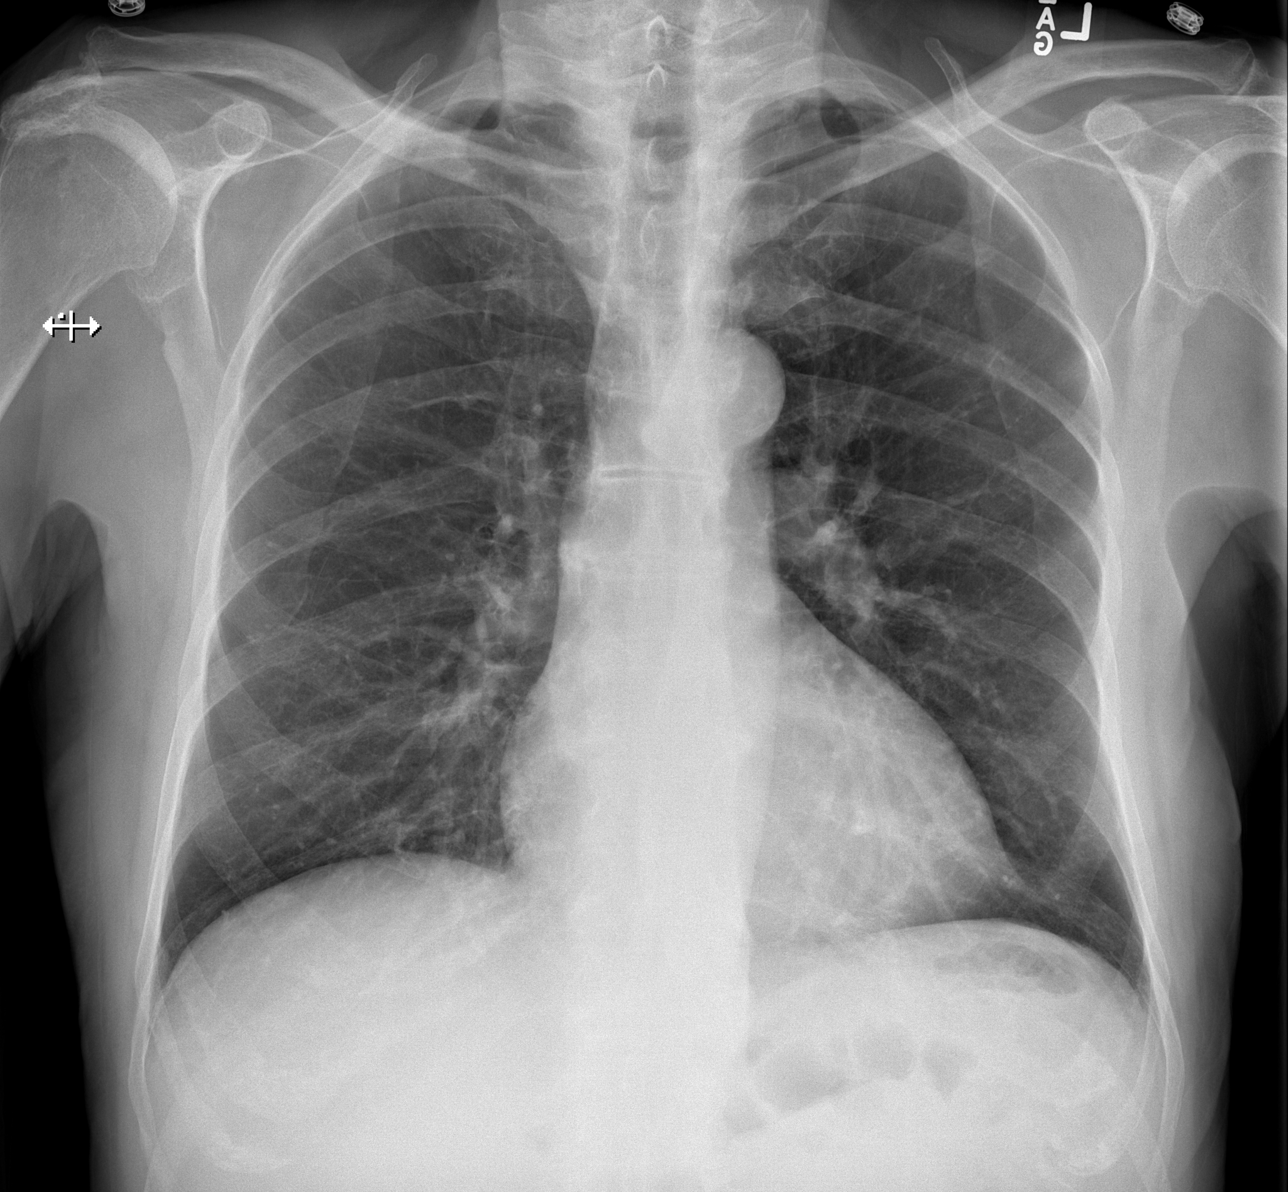

[w abdomen upright]
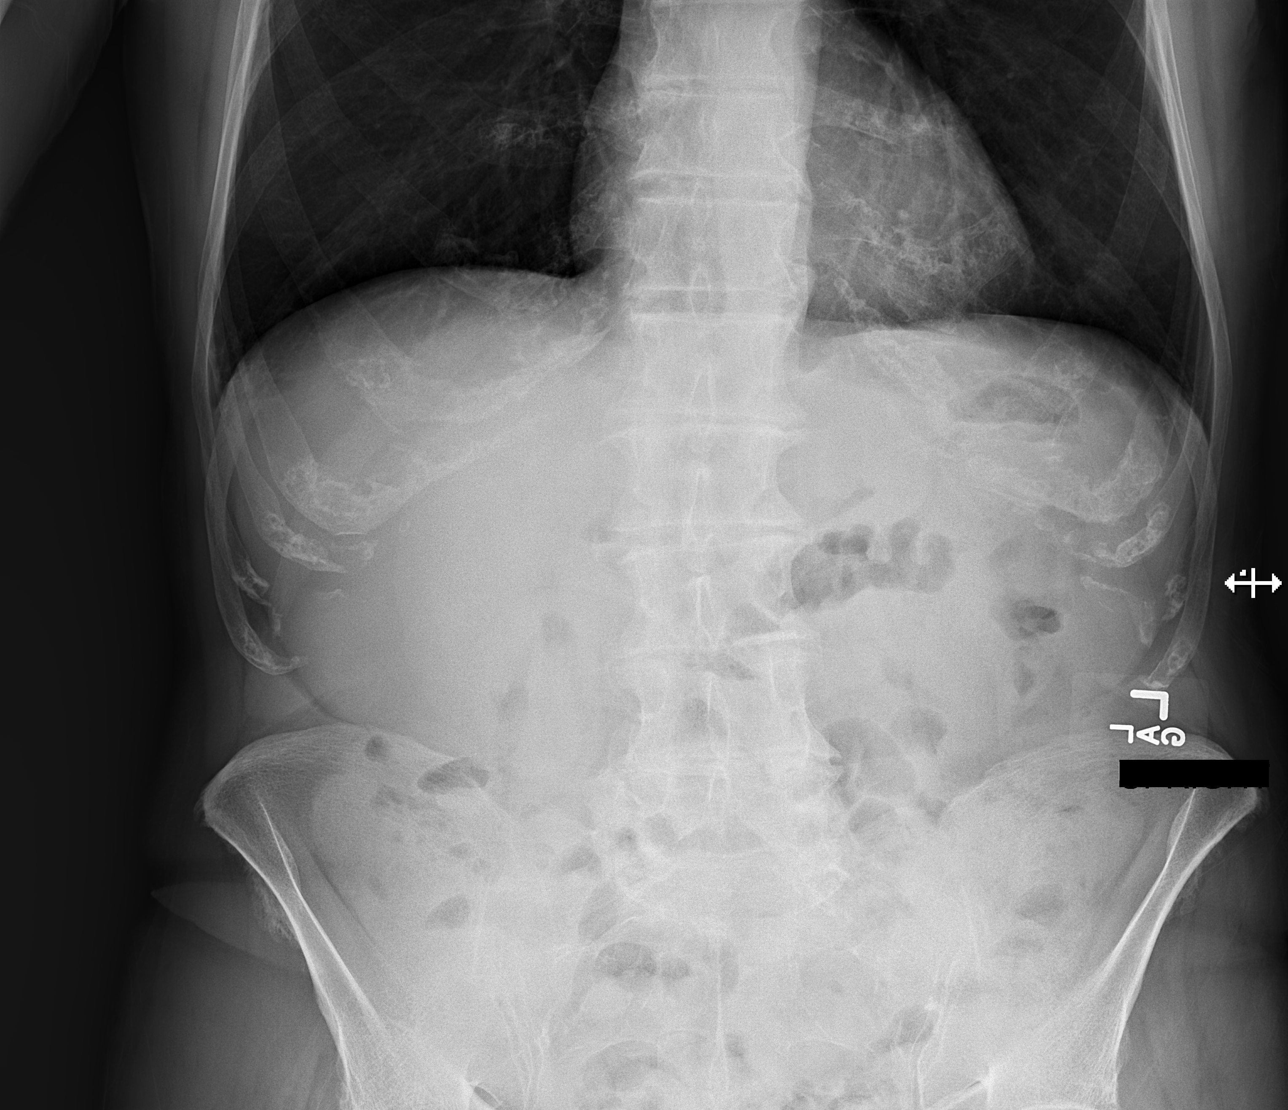

[x abdomen supine]
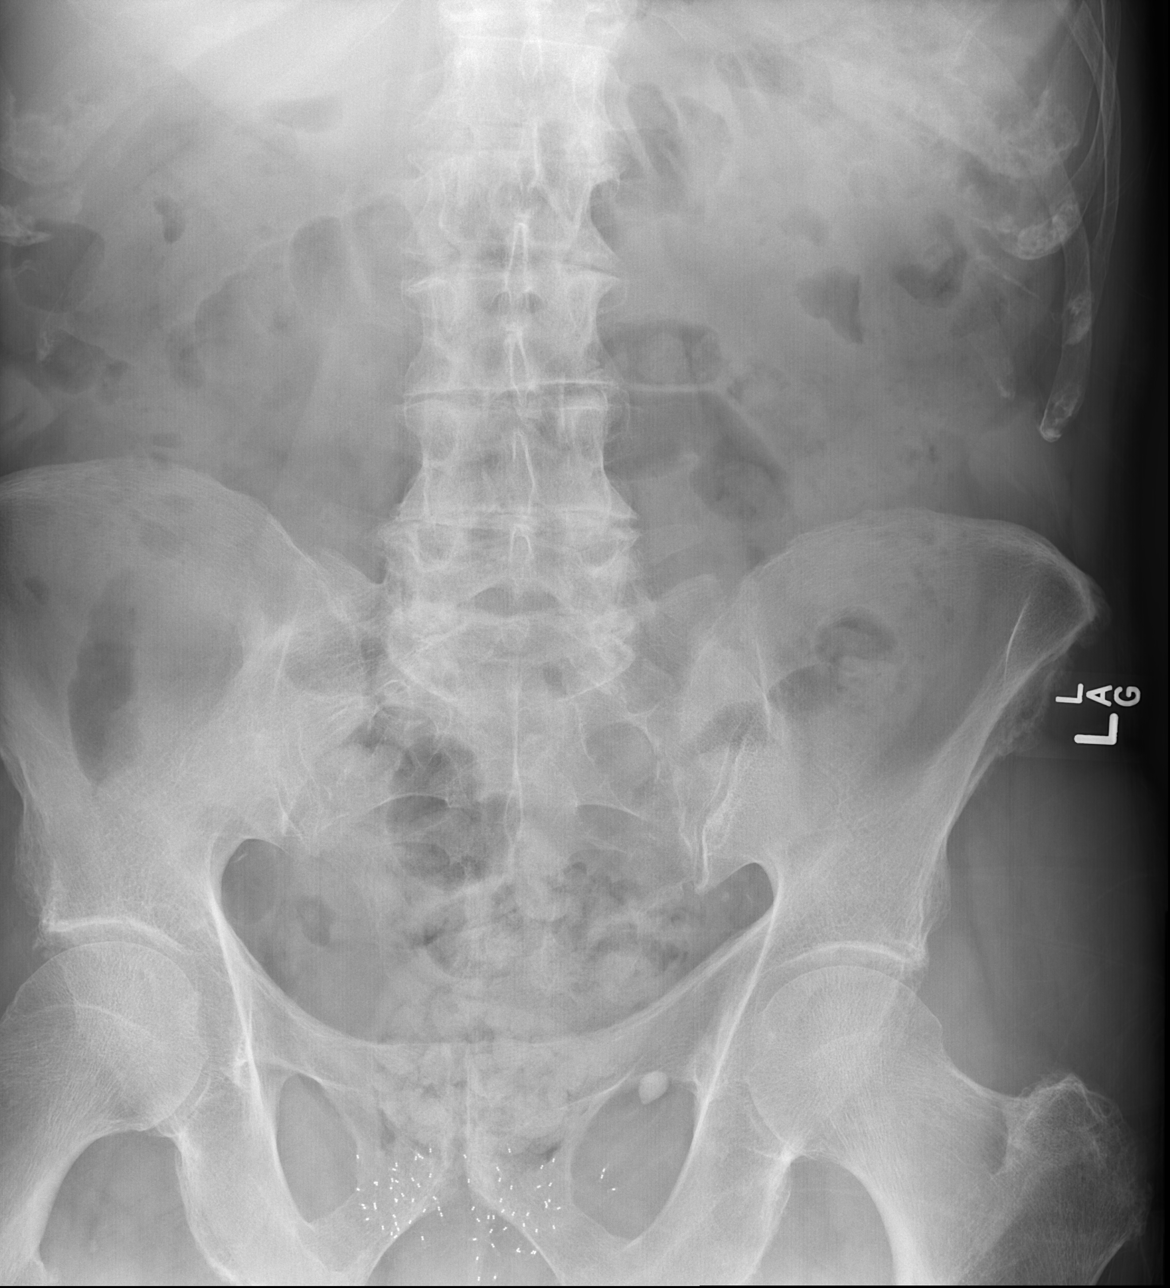

[3 of 3 positions shown; findings below may reference images not displayed]

FINDINGS: The abdominal gas pattern is negative for obstruction or
perforation. Prostatic seed implants noted. No biliary or urinary
calculi.

The upright view of the chest is negative for acute cardiopulmonary
abnormality and is unchanged from 03/08/2016.
IMPRESSION: Negative abdominal radiographs.  No acute cardiopulmonary disease.

## 2018-04-30 IMAGING — CR DG ABDOMEN ACUTE W/ 1V CHEST
4 series · 4 of 4 positions shown · non-contrast
Comparison: June 22, 2017.

CLINICAL DATA: Abdominal pain.

EXAM:
DG ABDOMEN ACUTE W/ 1V CHEST

[w abdomen decub (1 of 2)]
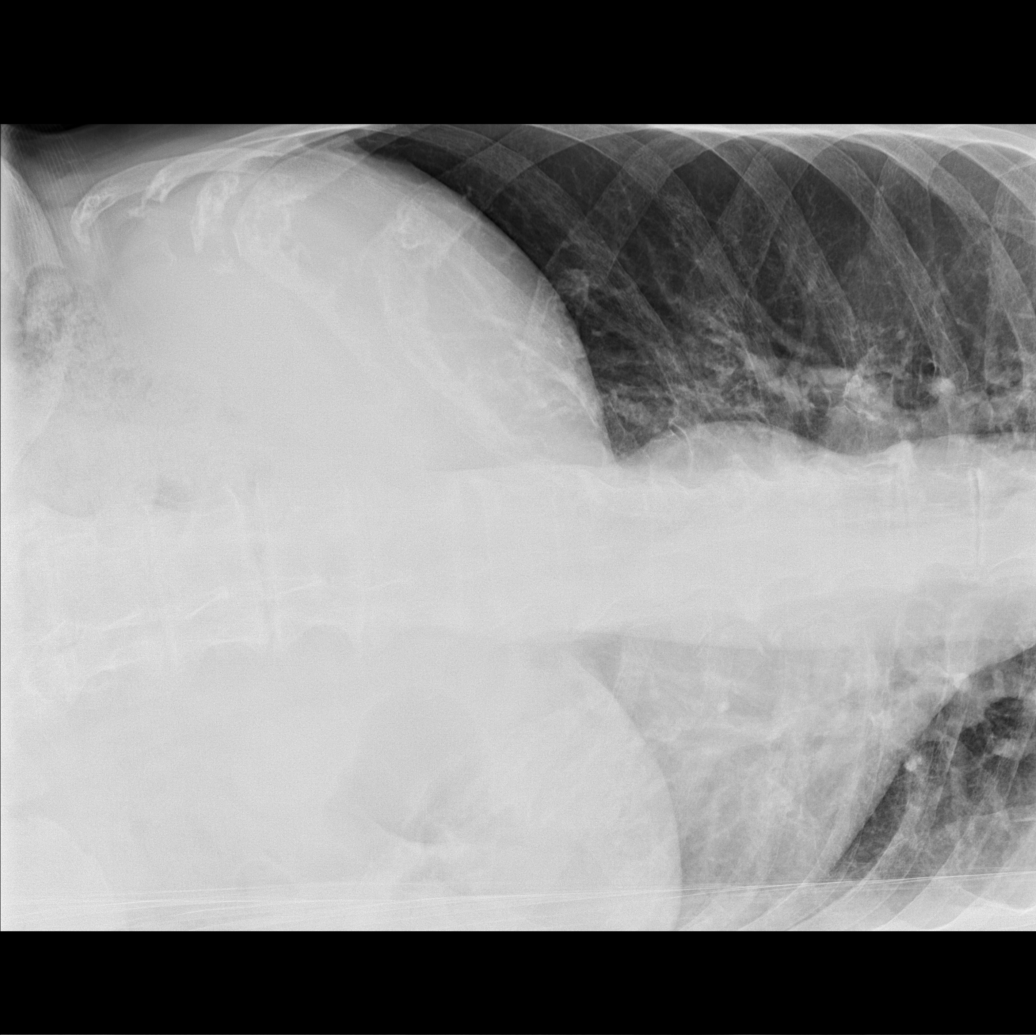

[w abdomen decub (2 of 2)]
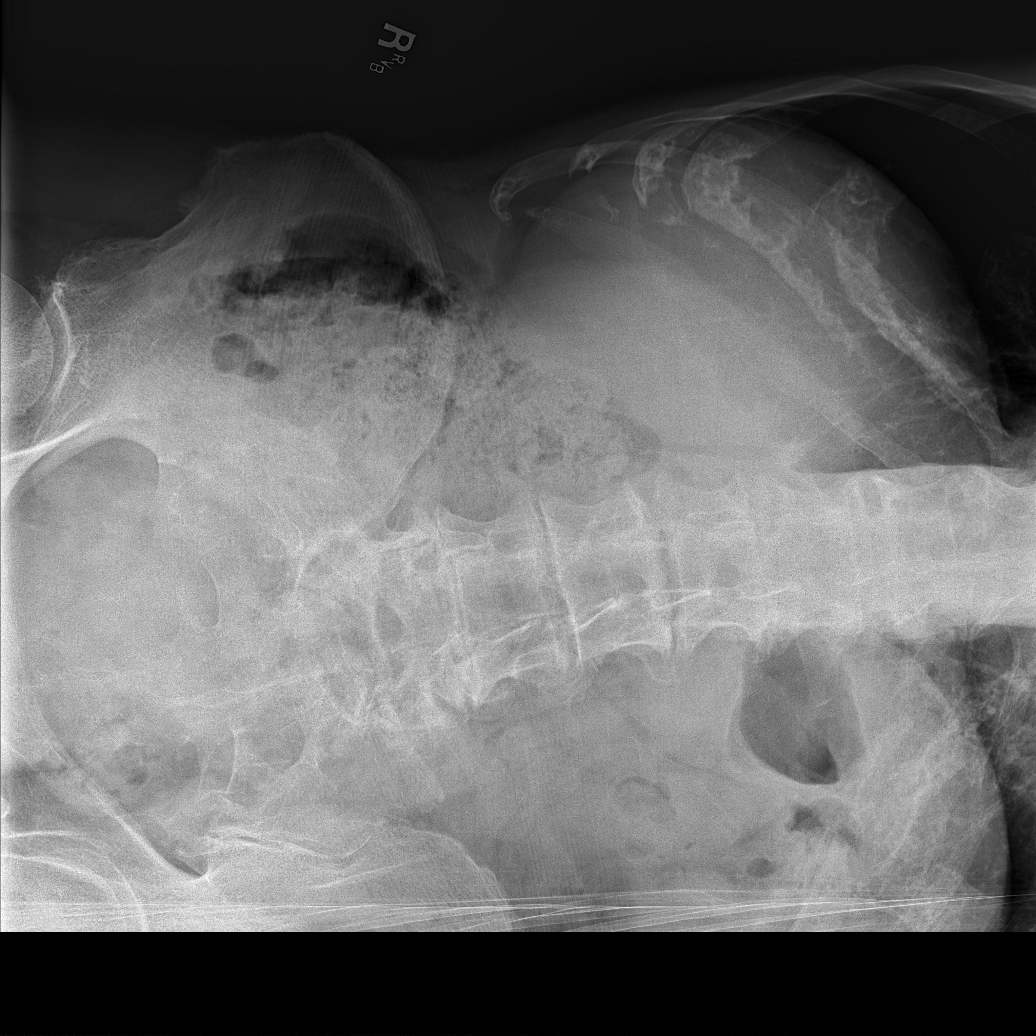

[x abdomen supine]
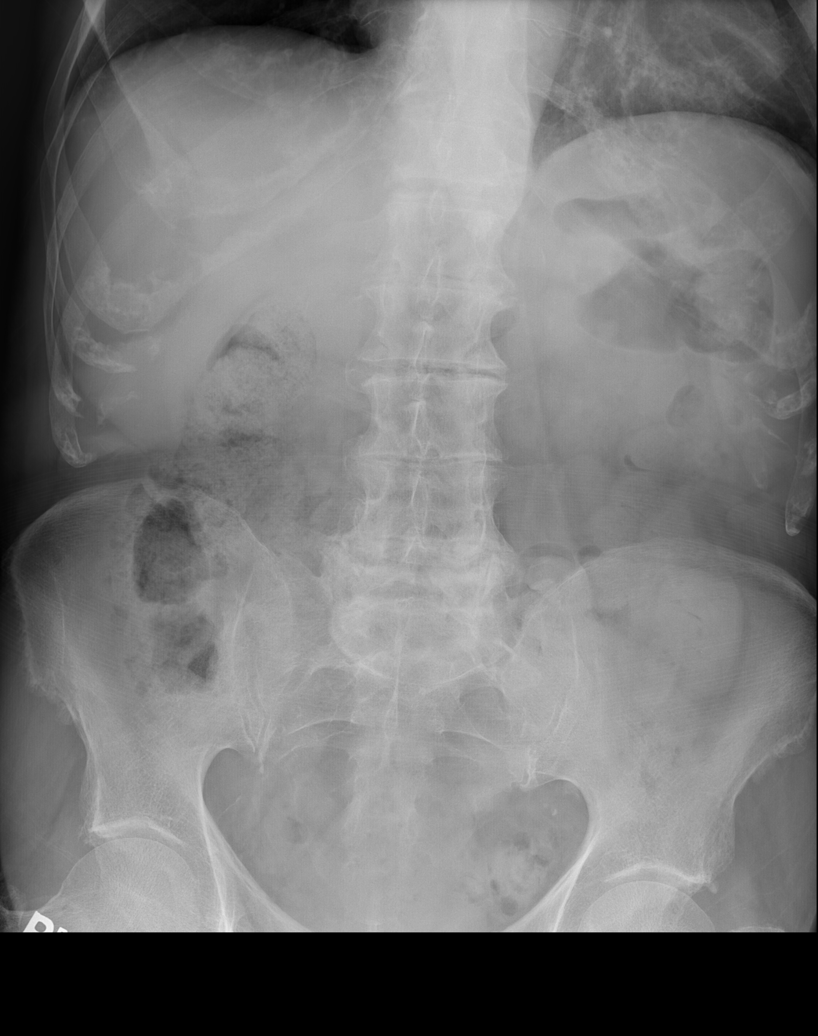

[x chest ap]
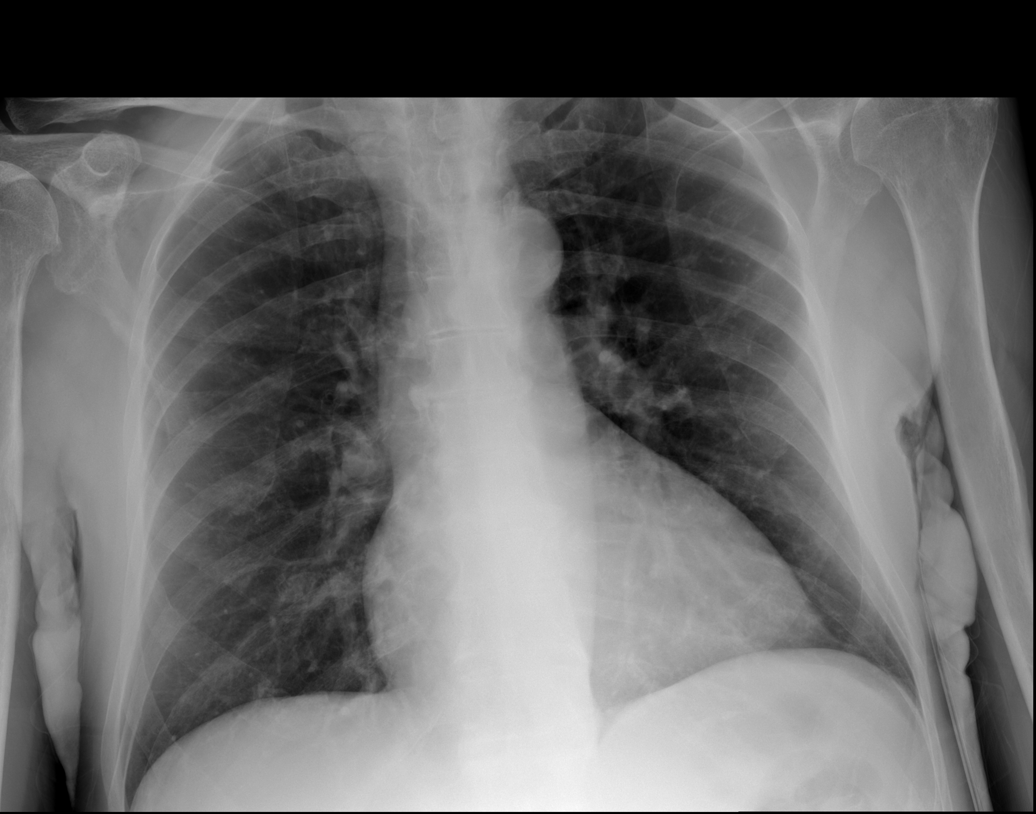

[4 of 4 positions shown; findings below may reference images not displayed]

FINDINGS: There is no evidence of dilated bowel loops or free intraperitoneal
air. No radiopaque calculi or other significant radiographic
abnormality is seen. Unchanged prostatic seed implants. Degenerative
changes of the lumbar spine.

Heart size and mediastinal contours are within normal limits. Both
lungs are clear.
IMPRESSION: Negative abdominal radiographs.  No acute cardiopulmonary disease.

## 2018-07-25 ENCOUNTER — Encounter: Payer: Self-pay | Admitting: Hematology

## 2018-07-25 DIAGNOSIS — Z7189 Other specified counseling: Secondary | ICD-10-CM | POA: Insufficient documentation
